# Patient Record
Sex: Female | Born: 1965 | Race: White | Hispanic: No | Marital: Single | Smoking: Current every day smoker
Health system: Southern US, Community
[De-identification: ages and names within clinical notes are randomized; demographics above are authoritative.]

## PROBLEM LIST (undated history)

## (undated) DIAGNOSIS — J90 Pleural effusion, not elsewhere classified: Secondary | ICD-10-CM

## (undated) DIAGNOSIS — R51 Headache: Secondary | ICD-10-CM

## (undated) DIAGNOSIS — I700 Atherosclerosis of aorta: Secondary | ICD-10-CM

## (undated) DIAGNOSIS — R069 Unspecified abnormalities of breathing: Secondary | ICD-10-CM

## (undated) DIAGNOSIS — E43 Unspecified severe protein-calorie malnutrition: Secondary | ICD-10-CM

## (undated) DIAGNOSIS — D649 Anemia, unspecified: Secondary | ICD-10-CM

## (undated) DIAGNOSIS — K275 Chronic or unspecified peptic ulcer, site unspecified, with perforation: Secondary | ICD-10-CM

## (undated) DIAGNOSIS — R519 Headache, unspecified: Secondary | ICD-10-CM

## (undated) DIAGNOSIS — K219 Gastro-esophageal reflux disease without esophagitis: Secondary | ICD-10-CM

## (undated) DIAGNOSIS — F329 Major depressive disorder, single episode, unspecified: Secondary | ICD-10-CM

## (undated) DIAGNOSIS — J869 Pyothorax without fistula: Secondary | ICD-10-CM

## (undated) DIAGNOSIS — K651 Peritoneal abscess: Secondary | ICD-10-CM

## (undated) DIAGNOSIS — F32A Depression, unspecified: Secondary | ICD-10-CM

## (undated) DIAGNOSIS — M1990 Unspecified osteoarthritis, unspecified site: Secondary | ICD-10-CM

## (undated) DIAGNOSIS — R0603 Acute respiratory distress: Secondary | ICD-10-CM

## (undated) DIAGNOSIS — E876 Hypokalemia: Secondary | ICD-10-CM

## (undated) DIAGNOSIS — F419 Anxiety disorder, unspecified: Secondary | ICD-10-CM

## (undated) HISTORY — PX: DIAGNOSTIC LAPAROSCOPY: SUR761

## (undated) HISTORY — DX: Unspecified severe protein-calorie malnutrition: E43

## (undated) HISTORY — DX: Major depressive disorder, single episode, unspecified: F32.9

## (undated) HISTORY — DX: Chronic or unspecified peptic ulcer, site unspecified, with perforation: K27.5

## (undated) HISTORY — DX: Depression, unspecified: F32.A

## (undated) HISTORY — DX: Anxiety disorder, unspecified: F41.9

## (undated) HISTORY — DX: Hypokalemia: E87.6

## (undated) HISTORY — DX: Peritoneal abscess: K65.1

## (undated) HISTORY — DX: Pyothorax without fistula: J86.9

## (undated) HISTORY — DX: Acute respiratory distress: R06.03

## (undated) HISTORY — DX: Unspecified abnormalities of breathing: R06.9

## (undated) HISTORY — PX: TONSILLECTOMY: SUR1361

## (undated) HISTORY — DX: Pleural effusion, not elsewhere classified: J90

## (undated) HISTORY — DX: Gastro-esophageal reflux disease without esophagitis: K21.9

---

## 2005-04-08 ENCOUNTER — Inpatient Hospital Stay: Payer: Self-pay | Admitting: Obstetrics and Gynecology

## 2007-04-23 ENCOUNTER — Ambulatory Visit: Payer: Self-pay | Admitting: Obstetrics and Gynecology

## 2008-06-16 ENCOUNTER — Ambulatory Visit: Payer: Self-pay

## 2009-08-03 ENCOUNTER — Ambulatory Visit: Payer: Self-pay

## 2016-06-04 DIAGNOSIS — A419 Sepsis, unspecified organism: Secondary | ICD-10-CM

## 2016-06-04 HISTORY — DX: Sepsis, unspecified organism: A41.9

## 2016-07-20 ENCOUNTER — Encounter
Admission: EM | Disposition: A | Discharge status: Home Health | Payer: Self-pay | Source: Home / Self Care | Attending: Surgery

## 2016-07-20 ENCOUNTER — Emergency Department: Payer: BLUE CROSS/BLUE SHIELD | Admitting: Anesthesiology

## 2016-07-20 ENCOUNTER — Encounter: Payer: Self-pay | Admitting: Emergency Medicine

## 2016-07-20 ENCOUNTER — Inpatient Hospital Stay
Admission: EM | Admit: 2016-07-20 | Discharge: 2016-09-14 | DRG: 853 | Disposition: A | Discharge status: Home Health | Payer: BLUE CROSS/BLUE SHIELD | Attending: Surgery | Admitting: Surgery

## 2016-07-20 ENCOUNTER — Emergency Department: Payer: BLUE CROSS/BLUE SHIELD

## 2016-07-20 DIAGNOSIS — K429 Umbilical hernia without obstruction or gangrene: Secondary | ICD-10-CM | POA: Diagnosis present

## 2016-07-20 DIAGNOSIS — T814XXA Infection following a procedure, initial encounter: Secondary | ICD-10-CM | POA: Diagnosis not present

## 2016-07-20 DIAGNOSIS — K567 Ileus, unspecified: Secondary | ICD-10-CM | POA: Diagnosis not present

## 2016-07-20 DIAGNOSIS — J869 Pyothorax without fistula: Secondary | ICD-10-CM | POA: Diagnosis not present

## 2016-07-20 DIAGNOSIS — R198 Other specified symptoms and signs involving the digestive system and abdomen: Secondary | ICD-10-CM

## 2016-07-20 DIAGNOSIS — A421 Abdominal actinomycosis: Secondary | ICD-10-CM | POA: Diagnosis present

## 2016-07-20 DIAGNOSIS — Z681 Body mass index (BMI) 19 or less, adult: Secondary | ICD-10-CM

## 2016-07-20 DIAGNOSIS — K255 Chronic or unspecified gastric ulcer with perforation: Secondary | ICD-10-CM

## 2016-07-20 DIAGNOSIS — R0600 Dyspnea, unspecified: Secondary | ICD-10-CM | POA: Diagnosis not present

## 2016-07-20 DIAGNOSIS — J90 Pleural effusion, not elsewhere classified: Secondary | ICD-10-CM | POA: Diagnosis not present

## 2016-07-20 DIAGNOSIS — J939 Pneumothorax, unspecified: Secondary | ICD-10-CM | POA: Diagnosis not present

## 2016-07-20 DIAGNOSIS — F17210 Nicotine dependence, cigarettes, uncomplicated: Secondary | ICD-10-CM | POA: Diagnosis present

## 2016-07-20 DIAGNOSIS — R0603 Acute respiratory distress: Secondary | ICD-10-CM

## 2016-07-20 DIAGNOSIS — B9620 Unspecified Escherichia coli [E. coli] as the cause of diseases classified elsewhere: Secondary | ICD-10-CM | POA: Diagnosis present

## 2016-07-20 DIAGNOSIS — Z01818 Encounter for other preprocedural examination: Secondary | ICD-10-CM

## 2016-07-20 DIAGNOSIS — R079 Chest pain, unspecified: Secondary | ICD-10-CM

## 2016-07-20 DIAGNOSIS — J9601 Acute respiratory failure with hypoxia: Secondary | ICD-10-CM | POA: Diagnosis not present

## 2016-07-20 DIAGNOSIS — Y838 Other surgical procedures as the cause of abnormal reaction of the patient, or of later complication, without mention of misadventure at the time of the procedure: Secondary | ICD-10-CM | POA: Diagnosis present

## 2016-07-20 DIAGNOSIS — E876 Hypokalemia: Secondary | ICD-10-CM | POA: Diagnosis present

## 2016-07-20 DIAGNOSIS — L0291 Cutaneous abscess, unspecified: Secondary | ICD-10-CM

## 2016-07-20 DIAGNOSIS — I480 Paroxysmal atrial fibrillation: Secondary | ICD-10-CM | POA: Diagnosis not present

## 2016-07-20 DIAGNOSIS — IMO0002 Reserved for concepts with insufficient information to code with codable children: Secondary | ICD-10-CM

## 2016-07-20 DIAGNOSIS — Z0189 Encounter for other specified special examinations: Secondary | ICD-10-CM

## 2016-07-20 DIAGNOSIS — J989 Respiratory disorder, unspecified: Secondary | ICD-10-CM | POA: Diagnosis not present

## 2016-07-20 DIAGNOSIS — D72829 Elevated white blood cell count, unspecified: Secondary | ICD-10-CM

## 2016-07-20 DIAGNOSIS — Z09 Encounter for follow-up examination after completed treatment for conditions other than malignant neoplasm: Secondary | ICD-10-CM

## 2016-07-20 DIAGNOSIS — E873 Alkalosis: Secondary | ICD-10-CM | POA: Diagnosis not present

## 2016-07-20 DIAGNOSIS — K650 Generalized (acute) peritonitis: Secondary | ICD-10-CM | POA: Diagnosis present

## 2016-07-20 DIAGNOSIS — B3789 Other sites of candidiasis: Secondary | ICD-10-CM | POA: Diagnosis present

## 2016-07-20 DIAGNOSIS — K660 Peritoneal adhesions (postprocedural) (postinfection): Secondary | ICD-10-CM | POA: Diagnosis not present

## 2016-07-20 DIAGNOSIS — R1013 Epigastric pain: Secondary | ICD-10-CM | POA: Diagnosis present

## 2016-07-20 DIAGNOSIS — R6520 Severe sepsis without septic shock: Secondary | ICD-10-CM | POA: Diagnosis not present

## 2016-07-20 DIAGNOSIS — E871 Hypo-osmolality and hyponatremia: Secondary | ICD-10-CM | POA: Diagnosis not present

## 2016-07-20 DIAGNOSIS — Z79899 Other long term (current) drug therapy: Secondary | ICD-10-CM | POA: Diagnosis not present

## 2016-07-20 DIAGNOSIS — J9690 Respiratory failure, unspecified, unspecified whether with hypoxia or hypercapnia: Secondary | ICD-10-CM

## 2016-07-20 DIAGNOSIS — K632 Fistula of intestine: Secondary | ICD-10-CM | POA: Diagnosis not present

## 2016-07-20 DIAGNOSIS — D62 Acute posthemorrhagic anemia: Secondary | ICD-10-CM | POA: Diagnosis not present

## 2016-07-20 DIAGNOSIS — K651 Peritoneal abscess: Secondary | ICD-10-CM | POA: Diagnosis not present

## 2016-07-20 DIAGNOSIS — R5381 Other malaise: Secondary | ICD-10-CM | POA: Diagnosis not present

## 2016-07-20 DIAGNOSIS — Z9889 Other specified postprocedural states: Secondary | ICD-10-CM

## 2016-07-20 DIAGNOSIS — E8770 Fluid overload, unspecified: Secondary | ICD-10-CM | POA: Diagnosis not present

## 2016-07-20 DIAGNOSIS — A419 Sepsis, unspecified organism: Secondary | ICD-10-CM | POA: Diagnosis present

## 2016-07-20 DIAGNOSIS — R0602 Shortness of breath: Secondary | ICD-10-CM

## 2016-07-20 DIAGNOSIS — E43 Unspecified severe protein-calorie malnutrition: Secondary | ICD-10-CM

## 2016-07-20 DIAGNOSIS — E860 Dehydration: Secondary | ICD-10-CM | POA: Diagnosis present

## 2016-07-20 DIAGNOSIS — R6521 Severe sepsis with septic shock: Secondary | ICD-10-CM | POA: Diagnosis not present

## 2016-07-20 DIAGNOSIS — R000 Tachycardia, unspecified: Secondary | ICD-10-CM

## 2016-07-20 DIAGNOSIS — B966 Bacteroides fragilis [B. fragilis] as the cause of diseases classified elsewhere: Secondary | ICD-10-CM | POA: Diagnosis present

## 2016-07-20 DIAGNOSIS — R069 Unspecified abnormalities of breathing: Secondary | ICD-10-CM

## 2016-07-20 DIAGNOSIS — J449 Chronic obstructive pulmonary disease, unspecified: Secondary | ICD-10-CM | POA: Diagnosis present

## 2016-07-20 DIAGNOSIS — K275 Chronic or unspecified peptic ulcer, site unspecified, with perforation: Secondary | ICD-10-CM | POA: Diagnosis not present

## 2016-07-20 DIAGNOSIS — R109 Unspecified abdominal pain: Secondary | ICD-10-CM

## 2016-07-20 DIAGNOSIS — R1021 Pelvic and perineal pain right side: Secondary | ICD-10-CM

## 2016-07-20 DIAGNOSIS — Z9689 Presence of other specified functional implants: Secondary | ICD-10-CM

## 2016-07-20 DIAGNOSIS — R102 Pelvic and perineal pain: Secondary | ICD-10-CM

## 2016-07-20 DIAGNOSIS — F419 Anxiety disorder, unspecified: Secondary | ICD-10-CM | POA: Diagnosis not present

## 2016-07-20 HISTORY — PX: LAPAROTOMY: SHX154

## 2016-07-20 LAB — URINALYSIS, COMPLETE (UACMP) WITH MICROSCOPIC
Bacteria, UA: NONE SEEN
Bilirubin Urine: NEGATIVE
GLUCOSE, UA: NEGATIVE mg/dL
HGB URINE DIPSTICK: NEGATIVE
Ketones, ur: NEGATIVE mg/dL
LEUKOCYTES UA: NEGATIVE
NITRITE: NEGATIVE
PH: 5 (ref 5.0–8.0)
Protein, ur: 100 mg/dL — AB
SPECIFIC GRAVITY, URINE: 1.029 (ref 1.005–1.030)

## 2016-07-20 LAB — MRSA PCR SCREENING: MRSA BY PCR: NEGATIVE

## 2016-07-20 LAB — TYPE AND SCREEN
ABO/RH(D): A POS
Antibody Screen: NEGATIVE

## 2016-07-20 LAB — COMPREHENSIVE METABOLIC PANEL
ALT: 13 U/L — AB (ref 14–54)
AST: 29 U/L (ref 15–41)
Albumin: 3.3 g/dL — ABNORMAL LOW (ref 3.5–5.0)
Alkaline Phosphatase: 90 U/L (ref 38–126)
Anion gap: 12 (ref 5–15)
BILIRUBIN TOTAL: 0.3 mg/dL (ref 0.3–1.2)
BUN: 27 mg/dL — AB (ref 6–20)
CO2: 21 mmol/L — ABNORMAL LOW (ref 22–32)
CREATININE: 0.66 mg/dL (ref 0.44–1.00)
Calcium: 9.5 mg/dL (ref 8.9–10.3)
Chloride: 102 mmol/L (ref 101–111)
Glucose, Bld: 126 mg/dL — ABNORMAL HIGH (ref 65–99)
Potassium: 3.2 mmol/L — ABNORMAL LOW (ref 3.5–5.1)
Sodium: 135 mmol/L (ref 135–145)
TOTAL PROTEIN: 7.2 g/dL (ref 6.5–8.1)

## 2016-07-20 LAB — WET PREP, GENITAL
CLUE CELLS WET PREP: NONE SEEN
SPERM: NONE SEEN
TRICH WET PREP: NONE SEEN
YEAST WET PREP: NONE SEEN

## 2016-07-20 LAB — CHLAMYDIA/NGC RT PCR (ARMC ONLY)
Chlamydia Tr: NOT DETECTED
N gonorrhoeae: NOT DETECTED

## 2016-07-20 LAB — CBC
HCT: 32.3 % — ABNORMAL LOW (ref 35.0–47.0)
Hemoglobin: 10.5 g/dL — ABNORMAL LOW (ref 12.0–16.0)
MCH: 22.5 pg — ABNORMAL LOW (ref 26.0–34.0)
MCHC: 32.5 g/dL (ref 32.0–36.0)
MCV: 69.4 fL — ABNORMAL LOW (ref 80.0–100.0)
PLATELETS: 414 10*3/uL (ref 150–440)
RBC: 4.66 MIL/uL (ref 3.80–5.20)
RDW: 22.1 % — AB (ref 11.5–14.5)
WBC: 5.5 10*3/uL (ref 3.6–11.0)

## 2016-07-20 LAB — MAGNESIUM: Magnesium: 1.4 mg/dL — ABNORMAL LOW (ref 1.7–2.4)

## 2016-07-20 LAB — LACTIC ACID, PLASMA
Lactic Acid, Venous: 2 mmol/L (ref 0.5–1.9)
Lactic Acid, Venous: 1.4 mmol/L (ref 0.5–1.9)

## 2016-07-20 LAB — PROTIME-INR
INR: 1.19
Prothrombin Time: 15.2 seconds (ref 11.4–15.2)

## 2016-07-20 LAB — PHOSPHORUS: PHOSPHORUS: 3.9 mg/dL (ref 2.5–4.6)

## 2016-07-20 LAB — PREGNANCY, URINE: Preg Test, Ur: NEGATIVE

## 2016-07-20 LAB — GLUCOSE, CAPILLARY: GLUCOSE-CAPILLARY: 128 mg/dL — AB (ref 65–99)

## 2016-07-20 LAB — LIPASE, BLOOD: Lipase: 13 U/L (ref 11–51)

## 2016-07-20 SURGERY — LAPAROTOMY, EXPLORATORY
Anesthesia: General | Wound class: Dirty or Infected

## 2016-07-20 MED ORDER — LACTATED RINGERS IV SOLN
INTRAVENOUS | Status: DC | PRN
  Administered 2016-07-20 (×2): via INTRAVENOUS

## 2016-07-20 MED ORDER — DEXAMETHASONE SODIUM PHOSPHATE 10 MG/ML IJ SOLN
INTRAMUSCULAR | Status: DC | PRN
  Administered 2016-07-20: 10 mg via INTRAVENOUS

## 2016-07-20 MED ORDER — MIDAZOLAM HCL 2 MG/2ML IJ SOLN
INTRAMUSCULAR | Status: AC
  Filled 2016-07-20: qty 2

## 2016-07-20 MED ORDER — LIDOCAINE HCL (CARDIAC) 20 MG/ML IV SOLN
INTRAVENOUS | Status: DC | PRN
  Administered 2016-07-20: 45 mg via INTRAVENOUS

## 2016-07-20 MED ORDER — SODIUM CHLORIDE 0.9 % IV SOLN
INTRAVENOUS | Status: DC | PRN
  Administered 2016-07-20: 70 mL

## 2016-07-20 MED ORDER — ONDANSETRON 4 MG PO TBDP
4.0000 mg | ORAL_TABLET | Freq: Four times a day (QID) | ORAL | Status: DC | PRN
  Administered 2016-08-13 – 2016-08-28 (×3): 4 mg via ORAL
  Filled 2016-07-20 (×4): qty 1

## 2016-07-20 MED ORDER — PIPERACILLIN-TAZOBACTAM 3.375 G IVPB 30 MIN
3.3750 g | Freq: Once | INTRAVENOUS | Status: AC
  Administered 2016-07-20: 3.375 g via INTRAVENOUS
  Filled 2016-07-20: qty 50

## 2016-07-20 MED ORDER — FENTANYL CITRATE (PF) 100 MCG/2ML IJ SOLN
INTRAMUSCULAR | Status: DC | PRN
  Administered 2016-07-20 (×2): 50 ug via INTRAVENOUS

## 2016-07-20 MED ORDER — HYDROMORPHONE HCL 1 MG/ML IJ SOLN
INTRAMUSCULAR | Status: DC | PRN
  Administered 2016-07-20: .2 mg via INTRAVENOUS

## 2016-07-20 MED ORDER — MORPHINE SULFATE (PF) 4 MG/ML IV SOLN
4.0000 mg | Freq: Once | INTRAVENOUS | Status: AC
  Administered 2016-07-20: 4 mg via INTRAVENOUS
  Filled 2016-07-20: qty 1

## 2016-07-20 MED ORDER — ALBUMIN HUMAN 5 % IV SOLN
INTRAVENOUS | Status: DC | PRN
  Administered 2016-07-20: 18:00:00 via INTRAVENOUS

## 2016-07-20 MED ORDER — SODIUM CHLORIDE 0.9 % IJ SOLN
INTRAMUSCULAR | Status: AC
  Filled 2016-07-20: qty 50

## 2016-07-20 MED ORDER — STERILE WATER FOR INJECTION IJ SOLN
INTRAMUSCULAR | Status: AC
  Administered 2016-07-20: 20 mL
  Filled 2016-07-20: qty 10

## 2016-07-20 MED ORDER — ROCURONIUM BROMIDE 100 MG/10ML IV SOLN
INTRAVENOUS | Status: DC | PRN
  Administered 2016-07-20: 15 mg via INTRAVENOUS
  Administered 2016-07-20: 20 mg via INTRAVENOUS
  Administered 2016-07-20: 5 mg via INTRAVENOUS
  Administered 2016-07-20: 10 mg via INTRAVENOUS

## 2016-07-20 MED ORDER — MIDAZOLAM HCL 5 MG/5ML IJ SOLN
INTRAMUSCULAR | Status: DC | PRN
  Administered 2016-07-20: 2 mg via INTRAVENOUS

## 2016-07-20 MED ORDER — SODIUM CHLORIDE 0.9 % IV BOLUS (SEPSIS)
1000.0000 mL | Freq: Once | INTRAVENOUS | Status: AC
  Administered 2016-07-20: 1000 mL via INTRAVENOUS

## 2016-07-20 MED ORDER — FENTANYL CITRATE (PF) 100 MCG/2ML IJ SOLN
INTRAMUSCULAR | Status: AC
  Filled 2016-07-20: qty 2

## 2016-07-20 MED ORDER — ENOXAPARIN SODIUM 40 MG/0.4ML ~~LOC~~ SOLN
40.0000 mg | SUBCUTANEOUS | Status: DC
  Administered 2016-07-21 – 2016-07-25 (×5): 40 mg via SUBCUTANEOUS
  Filled 2016-07-20 (×5): qty 0.4

## 2016-07-20 MED ORDER — ONDANSETRON HCL 4 MG/2ML IJ SOLN
INTRAMUSCULAR | Status: AC
  Filled 2016-07-20: qty 2

## 2016-07-20 MED ORDER — ONDANSETRON HCL 4 MG/2ML IJ SOLN: 4.0000 mg | Freq: Once | INTRAMUSCULAR | Status: DC | PRN

## 2016-07-20 MED ORDER — IOPAMIDOL (ISOVUE-300) INJECTION 61%
100.0000 mL | Freq: Once | INTRAVENOUS | Status: AC | PRN
  Administered 2016-07-20: 100 mL via INTRAVENOUS

## 2016-07-20 MED ORDER — DEXAMETHASONE SODIUM PHOSPHATE 10 MG/ML IJ SOLN
INTRAMUSCULAR | Status: AC
  Filled 2016-07-20: qty 1

## 2016-07-20 MED ORDER — ONDANSETRON HCL 4 MG/2ML IJ SOLN
INTRAMUSCULAR | Status: DC | PRN
  Administered 2016-07-20: 4 mg via INTRAVENOUS

## 2016-07-20 MED ORDER — SUGAMMADEX SODIUM 200 MG/2ML IV SOLN
INTRAVENOUS | Status: DC | PRN
  Administered 2016-07-20: 120 mg via INTRAVENOUS

## 2016-07-20 MED ORDER — ALBUMIN HUMAN 5 % IV SOLN
INTRAVENOUS | Status: AC
  Filled 2016-07-20: qty 500

## 2016-07-20 MED ORDER — DEXTROSE IN LACTATED RINGERS 5 % IV SOLN
INTRAVENOUS | Status: DC
  Administered 2016-07-20 – 2016-07-23 (×8): via INTRAVENOUS

## 2016-07-20 MED ORDER — ACETAMINOPHEN 10 MG/ML IV SOLN
1000.0000 mg | Freq: Four times a day (QID) | INTRAVENOUS | Status: AC
  Administered 2016-07-20 – 2016-07-21 (×3): 1000 mg via INTRAVENOUS
  Filled 2016-07-20 (×3): qty 100

## 2016-07-20 MED ORDER — METHYLENE BLUE 0.5 % INJ SOLN
INTRAVENOUS | Status: AC
  Filled 2016-07-20: qty 10

## 2016-07-20 MED ORDER — ACETAMINOPHEN 10 MG/ML IV SOLN
INTRAVENOUS | Status: DC | PRN
  Administered 2016-07-20: 1000 mg via INTRAVENOUS

## 2016-07-20 MED ORDER — HYDROMORPHONE HCL 1 MG/ML IJ SOLN
INTRAMUSCULAR | Status: AC
  Filled 2016-07-20: qty 1

## 2016-07-20 MED ORDER — PANTOPRAZOLE SODIUM 40 MG IV SOLR
40.0000 mg | Freq: Once | INTRAVENOUS | Status: DC
  Filled 2016-07-20: qty 40

## 2016-07-20 MED ORDER — ONDANSETRON HCL 4 MG/2ML IJ SOLN
4.0000 mg | Freq: Four times a day (QID) | INTRAMUSCULAR | Status: DC | PRN
  Administered 2016-07-20 – 2016-08-22 (×11): 4 mg via INTRAVENOUS
  Administered 2016-08-23: 17:00:00 via INTRAVENOUS
  Administered 2016-08-25 – 2016-09-06 (×4): 4 mg via INTRAVENOUS
  Filled 2016-07-20 (×15): qty 2

## 2016-07-20 MED ORDER — IOPAMIDOL (ISOVUE-300) INJECTION 61%
15.0000 mL | Freq: Once | INTRAVENOUS | Status: AC | PRN
Start: 2016-07-20 — End: 2016-07-20
  Administered 2016-07-20: 15 mL via ORAL

## 2016-07-20 MED ORDER — HYDROMORPHONE HCL 1 MG/ML IJ SOLN
1.0000 mg | Freq: Once | INTRAMUSCULAR | Status: AC
  Administered 2016-07-20: 1 mg via INTRAVENOUS
  Filled 2016-07-20: qty 1

## 2016-07-20 MED ORDER — SUCCINYLCHOLINE CHLORIDE 20 MG/ML IJ SOLN
INTRAMUSCULAR | Status: DC | PRN
  Administered 2016-07-20: 100 mg via INTRAVENOUS

## 2016-07-20 MED ORDER — PHENYLEPHRINE HCL 10 MG/ML IJ SOLN
INTRAMUSCULAR | Status: DC | PRN
  Administered 2016-07-20: 100 ug via INTRAVENOUS

## 2016-07-20 MED ORDER — BUPIVACAINE-EPINEPHRINE (PF) 0.25% -1:200000 IJ SOLN
INTRAMUSCULAR | Status: DC | PRN
  Administered 2016-07-20: 30 mL

## 2016-07-20 MED ORDER — FENTANYL CITRATE (PF) 100 MCG/2ML IJ SOLN
25.0000 ug | INTRAMUSCULAR | Status: DC | PRN
  Administered 2016-07-20: 25 ug via INTRAVENOUS

## 2016-07-20 MED ORDER — PROPOFOL 10 MG/ML IV BOLUS
INTRAVENOUS | Status: AC
  Filled 2016-07-20: qty 20

## 2016-07-20 MED ORDER — PANTOPRAZOLE SODIUM 40 MG IV SOLR
40.0000 mg | Freq: Two times a day (BID) | INTRAVENOUS | Status: DC
  Administered 2016-07-20 – 2016-08-05 (×33): 40 mg via INTRAVENOUS
  Filled 2016-07-20 (×32): qty 40

## 2016-07-20 MED ORDER — PIPERACILLIN-TAZOBACTAM 3.375 G IVPB
3.3750 g | Freq: Three times a day (TID) | INTRAVENOUS | Status: DC
  Administered 2016-07-20 – 2016-08-11 (×64): 3.375 g via INTRAVENOUS
  Filled 2016-07-20 (×63): qty 50

## 2016-07-20 MED ORDER — MORPHINE SULFATE (PF) 4 MG/ML IV SOLN
2.0000 mg | INTRAVENOUS | Status: DC | PRN
  Administered 2016-07-20 – 2016-07-27 (×5): 2 mg via INTRAVENOUS
  Filled 2016-07-20 (×5): qty 1

## 2016-07-20 MED ORDER — LIDOCAINE HCL 2 % EX GEL
CUTANEOUS | Status: AC
  Filled 2016-07-20: qty 5

## 2016-07-20 MED ORDER — PROPOFOL 10 MG/ML IV BOLUS
INTRAVENOUS | Status: DC | PRN
  Administered 2016-07-20: 140 mg via INTRAVENOUS

## 2016-07-20 MED ORDER — SODIUM CHLORIDE 0.9 % IV BOLUS (SEPSIS)
1000.0000 mL | Freq: Once | INTRAVENOUS | Status: AC
  Administered 2016-07-20: 400 mL via INTRAVENOUS
  Administered 2016-07-20: 600 mL via INTRAVENOUS

## 2016-07-20 MED ORDER — ACETAMINOPHEN 10 MG/ML IV SOLN
INTRAVENOUS | Status: AC
  Filled 2016-07-20: qty 100

## 2016-07-20 SURGICAL SUPPLY — 54 items
APPLIER CLIP 11 MED OPEN (CLIP)
APPLIER CLIP 13 LRG OPEN (CLIP)
BLADE CLIPPER SURG (BLADE) IMPLANT
BLADE SURG 15 STRL LF DISP TIS (BLADE) ×1 IMPLANT
BLADE SURG 15 STRL SS (BLADE) ×2
BULB RESERV EVAC DRAIN JP 100C (MISCELLANEOUS) ×6 IMPLANT
CANISTER SUCT 3000ML (MISCELLANEOUS) ×6 IMPLANT
CHLORAPREP W/TINT 26ML (MISCELLANEOUS) ×3 IMPLANT
CLIP APPLIE 11 MED OPEN (CLIP) IMPLANT
CLIP APPLIE 13 LRG OPEN (CLIP) IMPLANT
DRAIN CHANNEL JP 19F (MISCELLANEOUS) ×6 IMPLANT
DRAPE LAPAROTOMY 100X77 ABD (DRAPES) ×3 IMPLANT
DRAPE TABLE BACK 80X90 (DRAPES) ×3 IMPLANT
DRSG TEGADERM 2-3/8X2-3/4 SM (GAUZE/BANDAGES/DRESSINGS) IMPLANT
DRSG TEGADERM 4X4.75 (GAUZE/BANDAGES/DRESSINGS) ×6 IMPLANT
DRSG TELFA 3X8 NADH (GAUZE/BANDAGES/DRESSINGS) ×3 IMPLANT
ELECT BLADE 6.5 EXT (BLADE) IMPLANT
ELECT CAUTERY BLADE 6.4 (BLADE) ×3 IMPLANT
ELECT REM PT RETURN 9FT ADLT (ELECTROSURGICAL) ×3
ELECTRODE REM PT RTRN 9FT ADLT (ELECTROSURGICAL) ×1 IMPLANT
GAUZE SPONGE 4X4 12PLY STRL (GAUZE/BANDAGES/DRESSINGS) ×6 IMPLANT
GLOVE BIO SURGEON STRL SZ7 (GLOVE) ×18 IMPLANT
GOWN STRL REUS W/ TWL LRG LVL3 (GOWN DISPOSABLE) ×3 IMPLANT
GOWN STRL REUS W/TWL LRG LVL3 (GOWN DISPOSABLE) ×6
HANDLE SUCTION POOLE (INSTRUMENTS) ×1 IMPLANT
HANDLE YANKAUER SUCT BULB TIP (MISCELLANEOUS) ×3 IMPLANT
HOLDER FOLEY CATH W/STRAP (MISCELLANEOUS) ×3 IMPLANT
LIGASURE IMPACT 36 18CM CVD LR (INSTRUMENTS) IMPLANT
NEEDLE HYPO 22GX1.5 SAFETY (NEEDLE) ×6 IMPLANT
NEEDLE HYPO 25X1 1.5 SAFETY (NEEDLE) ×3 IMPLANT
NS IRRIG 1000ML POUR BTL (IV SOLUTION) ×9 IMPLANT
PACK BASIN MAJOR ARMC (MISCELLANEOUS) ×3 IMPLANT
PENCIL ELECTRO HAND CTR (MISCELLANEOUS) ×3 IMPLANT
RELOAD PROXIMATE 75MM BLUE (ENDOMECHANICALS) IMPLANT
SPONGE LAP 18X18 5 PK (GAUZE/BANDAGES/DRESSINGS) ×6 IMPLANT
SPONGE LAP 18X36 2PK (MISCELLANEOUS) ×3 IMPLANT
STAPLER PROXIMATE 75MM BLUE (STAPLE) IMPLANT
STAPLER SKIN PROX 35W (STAPLE) ×3 IMPLANT
SUCTION POOLE HANDLE (INSTRUMENTS) ×3
SUT ETHILON 3-0 FS-10 30 BLK (SUTURE) ×3
SUT PDS AB 1 TP1 96 (SUTURE) IMPLANT
SUT SILK 2 0 (SUTURE) ×2
SUT SILK 2 0 SH CR/8 (SUTURE) ×3 IMPLANT
SUT SILK 2 0SH CR/8 30 (SUTURE) ×6 IMPLANT
SUT SILK 2-0 18XBRD TIE 12 (SUTURE) ×1 IMPLANT
SUT VIC AB 0 CT1 36 (SUTURE) ×6 IMPLANT
SUT VIC AB 2-0 SH 27 (SUTURE) ×4
SUT VIC AB 2-0 SH 27XBRD (SUTURE) ×2 IMPLANT
SUTURE EHLN 3-0 FS-10 30 BLK (SUTURE) ×1 IMPLANT
SYR 30ML LL (SYRINGE) ×6 IMPLANT
SYR 3ML LL SCALE MARK (SYRINGE) ×3 IMPLANT
SYR BULB IRRIG 60ML STRL (SYRINGE) ×3 IMPLANT
TAPE MICROFOAM 4IN (TAPE) IMPLANT
TRAY FOLEY W/METER SILVER 16FR (SET/KITS/TRAYS/PACK) ×3 IMPLANT

## 2016-07-20 NOTE — ED Triage Notes (Signed)
Patient presents to the ED with generalized abdominal pain x 2-3 days with intense cramping.  Patient states, "I thought it was just gas but gas-x didn't help, then I thought maybe I was constipated but I pooped a little and that didn't help either.  I feel like I'm having a baby."  Patient appears pale and thin.  Patient denies history of similar pain.  Patient is having difficulty walking due to pain.

## 2016-07-20 NOTE — Anesthesia Procedure Notes (Signed)
Procedure Name: Intubation Date/Time: 07/20/2016 3:53 PM Performed by: Dionne Bucy Pre-anesthesia Checklist: Patient identified, Patient being monitored, Timeout performed, Emergency Drugs available and Suction available Patient Re-evaluated:Patient Re-evaluated prior to inductionOxygen Delivery Method: Circle system utilized Preoxygenation: Pre-oxygenation with 100% oxygen Intubation Type: IV induction, Cricoid Pressure applied and Rapid sequence Ventilation: Mask ventilation without difficulty Laryngoscope Size: Mac and 4 Grade View: Grade I Tube type: Oral Tube size: 7.0 mm Number of attempts: 1 Airway Equipment and Method: Stylet Placement Confirmation: ETT inserted through vocal cords under direct vision,  positive ETCO2 and breath sounds checked- equal and bilateral Secured at: 22 cm Tube secured with: Tape Dental Injury: Teeth and Oropharynx as per pre-operative assessment

## 2016-07-20 NOTE — ED Provider Notes (Signed)
[facility] Provider Note   CSN: [id number] Arrival date & time: 07/20/16  [id number]     History   Chief Complaint Chief Complaint  Patient presents with  . Abdominal Pain    HPI Lindsey Wolf is a 51 y.o. female otherwise healthy here with abdominal pain. Has lower abdominal pain associated with cramping for the last 2-3 days. She thought it was gas and took Gas-X yesterday With no relief. She states that the pain is as bad as her childbirth. Denies any urinary symptoms. Denies any constipation or diarrhea. Any previous abdominal surgeries and denies any NSAID use.    The history is provided by the patient.    History reviewed. No pertinent past medical history.  There are no active problems to display for this patient.   History reviewed. No pertinent surgical history.  OB History    No data available       Home Medications    Prior to Admission medications   Medication Sig Start Date End Date Taking? Authorizing Provider  ALPRAZolam (XANAX) 0.25 MG tablet Take 0.25 mg by mouth 3 (three) times daily as needed.  06/14/16  Yes Historical Provider, MD  FLUoxetine (PROZAC) 20 MG capsule Take 40 mg by mouth every morning. 06/15/16  Yes Historical Provider, MD    Family History No family history on file.  Social History Social History  Substance Use Topics  . Smoking status: Current Every Day Smoker    Types: Cigarettes  . Smokeless tobacco: Never Used  . Alcohol use No     Allergies   Patient has no known allergies.   Review of Systems Review of Systems  Gastrointestinal: Positive for abdominal pain.  All other systems reviewed and are negative.    Physical Exam Updated Vital Signs BP 120/76 (BP Location: Left Arm)   Pulse (!) 105   Temp 97.5 F (36.4 C) (Oral)   Resp 18   Ht 5\' 10"  (1.778 m)   Wt 125 lb (56.7 kg)   SpO2 99%   BMI 17.94 kg/m   Physical Exam  Constitutional: She is oriented to person, place, and time.    Uncomfortable   HENT:  Head: Normocephalic.  MM dry   Eyes: EOM are normal. Pupils are equal, round, and reactive to light.  Neck: Normal range of motion. Neck supple.  Cardiovascular: Normal rate, regular rhythm and normal heart sounds.   Pulmonary/Chest: Effort normal and breath sounds normal. No respiratory distress. She has no wheezes. She has no rales.  Abdominal:  Firm, mild diffuse tenderness, worse in the lower abdomen, some rebound   Genitourinary: Vagina normal.  Genitourinary Comments: Whitish discharge. No obvious CMT, ? Mild R adnexal tenderness.   Musculoskeletal: Normal range of motion.  Neurological: She is alert and oriented to person, place, and time. No cranial nerve deficit. Coordination normal.  Skin: Skin is warm.  Psychiatric: She has a normal mood and affect.  Nursing note and vitals reviewed.    ED Treatments / Results  Labs (all labs ordered are listed, but only abnormal results are displayed) Labs Reviewed  WET PREP, GENITAL - Abnormal; Notable for the following:       Result Value   WBC, Wet Prep HPF POC RARE (*)    All other components within normal limits  COMPREHENSIVE METABOLIC PANEL - Abnormal; Notable for the following:    Potassium 3.2 (*)    CO2 21 (*)    Glucose, Bld 126 (*)    BUN  27 (*)    Albumin 3.3 (*)    ALT 13 (*)    All other components within normal limits  CBC - Abnormal; Notable for the following:    Hemoglobin 10.5 (*)    HCT 32.3 (*)    MCV 69.4 (*)    MCH 22.5 (*)    RDW 22.1 (*)    All other components within normal limits  URINALYSIS, COMPLETE (UACMP) WITH MICROSCOPIC - Abnormal; Notable for the following:    Color, Urine AMBER (*)    APPearance CLEAR (*)    Protein, ur 100 (*)    Squamous Epithelial / LPF 0-5 (*)    All other components within normal limits  CHLAMYDIA/NGC RT PCR ([facility] ONLY)  CULTURE, BLOOD (ROUTINE X 2)  CULTURE, BLOOD (ROUTINE X 2)  LIPASE, BLOOD  PREGNANCY, URINE  LACTIC ACID,  PLASMA    EKG  EKG Interpretation None       Radiology Dg Abd Acute W/chest  Addendum Date: 07/20/2016   ADDENDUM REPORT: 07/20/2016 13:50 ADDENDUM: Critical Value/emergent results were called by telephone at the time of interpretation on 07/20/2016 at 1348 hours to Dr. Shirlyn Goltz , who verbally acknowledged these results. CT Abdomen and Pelvis is planned and pending. Electronically Signed   By: Genevie Ann M.D.   On: 07/20/2016 13:50   Result Date: 07/20/2016 CLINICAL DATA:  51 year old female with severe abdominal pain and vomiting for 2 days. Denies abdominal surgery. Initial encounter. EXAM: DG ABDOMEN ACUTE W/ 1V CHEST COMPARISON:  None. FINDINGS: Seated AP view of the chest is positive for pneumoperitoneum under the right hemidiaphragm which is also confirmed on the upright abdomen view. No pneumothorax. No pneumomediastinum is evident. Normal cardiac size and mediastinal contours. Visualized tracheal air column is within normal limits. The lungs appear clear. No pleural effusion. Unusual bowel-gas pattern including a central epigastric air-fluid level which seems unrelated to the stomach (arrow). On the upright abdomen view there is pneumoperitoneum under both hemidiaphragms. No other dilated loops. Relatively normal appearing distal small bowel and proximal colon gas. No acute osseous abnormality identified. IMPRESSION: 1. Positive for fairly large volume of free air in the abdomen indicating ruptured abdominal viscus. If the patient is stable enough CT Abdomen and Pelvis with oral and IV contrast is recommended. 2. Unusual epigastric bowel-gas pattern suspicious for dilated and abnormal upper abdominal bowel loop. 3.  No superimposed acute cardiopulmonary abnormality. Electronically Signed: By: Genevie Ann M.D. On: 07/20/2016 13:45    Procedures Procedures (including critical care time)   CRITICAL CARE Performed by: Wandra Arthurs   Total critical care time: 30 minutes  Critical care time was  exclusive of separately billable procedures and treating other patients.  Critical care was necessary to treat or prevent imminent or life-threatening deterioration.  Critical care was time spent personally by me on the following activities: development of treatment plan with patient and/or surrogate as well as nursing, discussions with consultants, evaluation of patient's response to treatment, examination of patient, obtaining history from patient or surrogate, ordering and performing treatments and interventions, ordering and review of laboratory studies, ordering and review of radiographic studies, pulse oximetry and re-evaluation of patient's condition.   Medications Ordered in ED Medications  piperacillin-tazobactam (ZOSYN) IVPB 3.375 g (not administered)  sodium chloride 0.9 % bolus 1,000 mL (1,000 mLs Intravenous New Bag/Given 07/20/16 1247)  sodium chloride 0.9 % bolus 1,000 mL (1,000 mLs Intravenous New Bag/Given 07/20/16 1247)  morphine 4 MG/ML injection 4 mg (4 mg  Intravenous Given 07/20/16 1247)  HYDROmorphone (DILAUDID) injection 1 mg (1 mg Intravenous Given 07/20/16 1431)  iopamidol (ISOVUE-300) 61 % injection 100 mL (100 mLs Intravenous Contrast Given 07/20/16 1401)  iopamidol (ISOVUE-300) 61 % injection 15 mL (15 mLs Oral Contrast Given 07/20/16 1400)     Initial Impression / Assessment and Plan / ED Course  I have reviewed the triage vital signs and the nursing notes.  Pertinent labs & imaging results that were available during my care of the patient were reviewed by me and considered in my medical decision making (see chart for details).     Lindsey Wolf is a 50 y.o. female here with abdominal pain. Diffusely tender, mostly in the lower abdomen. Consider torsion vs renal colic vs appy. Will get labs, xray abdominal series, [place].   2 pm Xray showed free air. Canceled ovarian US. Will get stat CT.   2:48 PM CT showed possible stomach ulcer perforation. WBC nl. Bicarb 21  likely from dehydration. Ordered zosyn. Cultures sent. Called Dr. Dahlia Byes from surgery, who will see patient and likely take her to the OR for repair.     Final Clinical Impressions(s) / ED Diagnoses   Final diagnoses:  Right adnexal tenderness  Right adnexal tenderness    New Prescriptions New Prescriptions   No medications on file     Drenda Freeze, MD 07/20/16 [phone]

## 2016-07-20 NOTE — ED Notes (Signed)
Per MD [place], 1 set of cultures if fine. MD ready for pt to go to OR

## 2016-07-20 NOTE — Anesthesia Post-op Follow-up Note (Cosign Needed)
Anesthesia QCDR form completed.        

## 2016-07-20 NOTE — H&P (Signed)
Patient ID: Lindsey Wolf, female   DOB: 01/05/1966, 51 y.o.   MRN: [id number]  HPI Lindsey Wolf is a 51 y.o. female with a 3 days hx of abdominal pain in the epigastric area and she thinks it was a gas type. Pain is moderate to severe in intensity and has worsening today. And also some chills. No previous abdominal operations. She states that she takes at least daily BC powder for her headache. He is able to perform more than 4 Mets of activity without any shortness of breath or chest pain. She smokes 1 pack of cigarettes a day. Further workup including a CT scan of the abdomen and pelvis showing extensive amount of free air and some thickening in the TRAM/duodenal junction. Some thickening of the small bowel. There is also significant ascites  HPI  History reviewed. No pertinent past medical history.  History reviewed. No pertinent surgical history.  No family history on file.  Social History Social History  Substance Use Topics  . Smoking status: Current Every Day Smoker    Types: Cigarettes  . Smokeless tobacco: Never Used  . Alcohol use No    No Known Allergies  Current Facility-Administered Medications  Medication Dose Route Frequency Provider Last Rate Last Dose  . pantoprazole (PROTONIX) injection 40 mg  40 mg Intravenous Once Drenda Freeze, MD      . piperacillin-tazobactam (ZOSYN) IVPB 3.375 g  3.375 g Intravenous Once Drenda Freeze, MD      . sodium chloride 0.9 % bolus 1,000 mL  1,000 mL Intravenous Once Jules Husbands, MD       Current Outpatient Prescriptions  Medication Sig Dispense Refill  . ALPRAZolam (XANAX) 0.25 MG tablet Take 0.25 mg by mouth 3 (three) times daily as needed.   0  . FLUoxetine (PROZAC) 20 MG capsule Take 40 mg by mouth every morning.  0     Review of Systems A 10 point review of systems was asked and was negative except for the information on the HPI  Physical Exam Blood pressure 120/76, pulse (!) 105, temperature 97.5 F  (36.4 C), temperature source Oral, resp. rate 18, height 5\' 10"  (1.778 m), weight 56.7 kg (125 lb), SpO2 99 %. CONSTITUTIONAL: Pale , tachycardic and diaphoretic EYES: Pupils are equal, round, and reactive to light, Sclera are non-icteric. EARS, NOSE, MOUTH AND THROAT: The oropharynx is clear. The oral mucosa is pink  She is dehydrated. Hearing is intact to voice. LYMPH NODES:  Lymph nodes in the neck are normal. RESPIRATORY:  Lungs are clear. There is normal respiratory effort, with equal breath sounds bilaterally, and without pathologic use of accessory muscles. CARDIOVASCULAR: Heart is regular without murmurs, gallops, or rubs. GI: The abdomen is Tender epigastric area, rebound tenderness and guarding.  GU: Rectal deferred.   MUSCULOSKELETAL: Normal muscle strength and tone. No cyanosis or edema.   SKIN: Turgor is good and there are no pathologic skin lesions or ulcers. NEUROLOGIC: Motor and sensation is grossly normal. Cranial nerves are grossly intact. PSYCH:  Oriented to person, place and time. Affect is normal.  Data Reviewed I have personally reviewed the patient's imaging, laboratory findings and medical records.    Assessment/Plan 51 year old female presented with an acute abdomen and free air consistent with perforated ulcer. Patient is showing signs of sepsis and is in need for emergent surgical intervention. We'll go ahead and start [name] resuscitation, place NG tube and Foley. Start broad-spectrum antibiotics and type and cross her for 2  units. I have discussed with her in detail about the necessity of this surgery to be able to save her life. I have explained to her the risk, the benefits and the possible complications including but not limited to: Bleeding, infection, chronic pain, recurrence and multiple re-interventions. He understands and wishes to proceed. We'll go ahead and proceed with exploratory laparotomy.  Caroleen Hamman, MD FACS General Surgeon 07/20/2016, 3:20  PM

## 2016-07-20 NOTE — ED Notes (Signed)
Pt unable to urinate at this moment, given specimen cup for when is able to void.  

## 2016-07-20 NOTE — Progress Notes (Signed)
Day of Surgery   Subjective:  Patient visit for postop check. Patient reports feeling better than before surgery. Pain currently well controlled.  Vital signs in last 24 hours: Temp:  [97.5 F (36.4 C)-99 F (37.2 C)] 99 F (37.2 C) (02/16 2100) Pulse Rate:  [61-105] 90 (02/16 2100) Resp:  [13-21] 17 (02/16 2100) BP: (100-131)/(49-92) 121/80 (02/16 2100) SpO2:  [97 %-100 %] 100 % (02/16 2100) Weight:  [56.7 kg (125 lb)] 56.7 kg (125 lb) (02/16 0907)    Intake/Output from previous day: No intake/output data recorded.  GI: Abdomen soft, appropriately tender to palpation at the incision site, midline dressing clean, dry, intact. Bilateral JP drains with serosanguineous output.  Lab Results:  CBC  Recent Labs  07/20/16 0934  WBC 5.5  HGB 10.5*  HCT 32.3*  PLT 414   CMP     Component Value Date/Time   NA 135 07/20/2016 0934   K 3.2 (L) 07/20/2016 0934   CL 102 07/20/2016 0934   CO2 21 (L) 07/20/2016 0934   GLUCOSE 126 (H) 07/20/2016 0934   BUN 27 (H) 07/20/2016 0934   CREATININE 0.66 07/20/2016 0934   CALCIUM 9.5 07/20/2016 0934   PROT 7.2 07/20/2016 0934   ALBUMIN 3.3 (L) 07/20/2016 0934   AST 29 07/20/2016 0934   ALT 13 (L) 07/20/2016 0934   ALKPHOS 90 07/20/2016 0934   BILITOT 0.3 07/20/2016 0934   GFRNONAA >60 07/20/2016 0934   GFRAA >60 07/20/2016 0934   PT/INR  Recent Labs  07/20/16 1507  LABPROT 15.2  INR 1.19    Studies/Results: Ct Abdomen Pelvis W Contrast  Result Date: 07/20/2016 CLINICAL DATA:  Abnormal radiographs demonstrating free air, 3 days generalized abdominal and pelvic pain, denies recent surgery and trauma EXAM: CT ABDOMEN AND PELVIS WITH CONTRAST TECHNIQUE: Multidetector CT imaging of the abdomen and pelvis was performed using the standard protocol following bolus administration of intravenous contrast. Sagittal and coronal MPR images reconstructed from axial data set. CONTRAST:  1mL ISOVUE-300 IOPAMIDOL (ISOVUE-300) INJECTION 61% PO,  176mL ISOVUE-300 IOPAMIDOL (ISOVUE-300) INJECTION 61% IV COMPARISON:  Abdominal radiographs 07/20/2016 FINDINGS: Lower chest: Lung bases clear Hepatobiliary: Distended gallbladder 5.1 cm transverse. Cystic lesion LEFT lobe liver 18 x 14 mm. No definite biliary dilatation. Pancreas: Normal appearance Spleen: Normal appearance Adrenals/Urinary Tract: Adrenal glands, kidneys, ureters, and bladder normal appearance Stomach/Bowel: Diffuse thickening of small bowel loops. Colon decompressed with some areas demonstrating diffuse colonic wall thickening as well. These changes may reflect diffuse peritonitis rather than a primary bowel process. Significant thickening of the antral wall of the stomach extending to pylorus could be due to gastritis or ulcer disease though an infiltrative process is not excluded. No definite contrast extravasation identified but findings are most concerning for gastric origin of perforation. Duodenum shows no definite wall thickening. Vascular/Lymphatic: Atherosclerotic calcifications aorta and iliac arteries. No aortic aneurysm or adenopathy. 9 mm perigastric node image 32. Reproductive: Unremarkable uterus and adnexa Other: Significant free intraperitoneal air and fluid compatible with perforated viscus. Scattered interloop air and fluid. Scattered areas of peritoneal enhancement compatible with peritonitis. No definite discrete loculated abscess collection. Tiny supraumbilical ventral hernia containing a small amount of free fluid and free air. Musculoskeletal: Unremarkable IMPRESSION: Extensive free intraperitoneal air and fluid compatible with perforated viscus. Site/source of perforation is not localized but greatest degree of wall thickening is identified at the gastric antrum, potentially could represent perforated ulcer disease. Mild diffuse bowel wall thickening of large and small bowel loops throughout abdomen favoring reactive  changes secondary to peritonitis. Findings called to Dr.  Darl Householder on 07/20/2016 at 1440 hours. Electronically Signed   By: Lavonia Dana M.D.   On: 07/20/2016 14:43   Dg Abd Acute W/chest  Addendum Date: 07/20/2016   ADDENDUM REPORT: 07/20/2016 13:50 ADDENDUM: Critical Value/emergent results were called by telephone at the time of interpretation on 07/20/2016 at 1348 hours to Dr. Shirlyn Goltz , who verbally acknowledged these results. CT Abdomen and Pelvis is planned and pending. Electronically Signed   By: Genevie Ann M.D.   On: 07/20/2016 13:50   Result Date: 07/20/2016 CLINICAL DATA:  51 year old female with severe abdominal pain and vomiting for 2 days. Denies abdominal surgery. Initial encounter. EXAM: DG ABDOMEN ACUTE W/ 1V CHEST COMPARISON:  None. FINDINGS: Seated AP view of the chest is positive for pneumoperitoneum under the right hemidiaphragm which is also confirmed on the upright abdomen view. No pneumothorax. No pneumomediastinum is evident. Normal cardiac size and mediastinal contours. Visualized tracheal air column is within normal limits. The lungs appear clear. No pleural effusion. Unusual bowel-gas pattern including a central epigastric air-fluid level which seems unrelated to the stomach (arrow). On the upright abdomen view there is pneumoperitoneum under both hemidiaphragms. No other dilated loops. Relatively normal appearing distal small bowel and proximal colon gas. No acute osseous abnormality identified. IMPRESSION: 1. Positive for fairly large volume of free air in the abdomen indicating ruptured abdominal viscus. If the patient is stable enough CT Abdomen and Pelvis with oral and IV contrast is recommended. 2. Unusual epigastric bowel-gas pattern suspicious for dilated and abnormal upper abdominal bowel loop. 3.  No superimposed acute cardiopulmonary abnormality. Electronically Signed: By: Genevie Ann M.D. On: 07/20/2016 13:45    Assessment/Plan: 51 year old female status post exploratory laparotomy with repair of peptic ulcer 2. Doing well in the  immediate postoperative period. Repeat lactic acid pending. We'll adjust fluids if necessary after repeat lactic acid. Continue NG tube decompression. Continue acid suppression.   Clayburn Pert, MD [facility]  Day [facility] [phone] Night [facility] [phone]  07/20/2016

## 2016-07-20 NOTE — ED Notes (Signed)
Pt in CT.

## 2016-07-20 NOTE — Anesthesia Preprocedure Evaluation (Addendum)
Anesthesia Evaluation  Patient identified by MRN, date of birth, ID band Patient awake    Reviewed: Allergy & Precautions, NPO status , Patient's Chart, lab work & pertinent test results  History of Anesthesia Complications Negative for: history of anesthetic complications  Airway Mallampati: II       Dental   Pulmonary COPD,  COPD inhaler, Current Smoker,           Cardiovascular negative cardio ROS       Neuro/Psych negative neurological ROS     GI/Hepatic Neg liver ROS, PUD,   Endo/Other  negative endocrine ROS  Renal/GU negative Renal ROS     Musculoskeletal   Abdominal   Peds  Hematology negative hematology ROS (+)   Anesthesia Other Findings   Reproductive/Obstetrics                            Anesthesia Physical Anesthesia Plan  ASA: II and emergent  Anesthesia Plan: General   Post-op Pain Management:    Induction: Intravenous and Rapid sequence  Airway Management Planned: Oral ETT  Additional Equipment:   Intra-op Plan:   Post-operative Plan:   Informed Consent: I have reviewed the patients History and Physical, chart, labs and discussed the procedure including the risks, benefits and alternatives for the proposed anesthesia with the patient or authorized representative who has indicated his/her understanding and acceptance.     Plan Discussed with:   Anesthesia Plan Comments:         Anesthesia Quick Evaluation

## 2016-07-20 NOTE — Op Note (Addendum)
PROCEDURES: 1. Laparotomy with Repair of gastric ulcers x 2 using both omental and Falciform as a patch 2. Repair of Umbilical hernia  Pre-operative Diagnosis: Perforated Peptic ulcer  Post-operative Diagnosis: Same  Surgeon: Param Capri F Donne Robillard   Anesthesia: General endotracheal anesthesia  ASA Class: 2 E  Surgeon: Caroleen Hamman , MD FACS  Anesthesia: Gen. with endotracheal tube  Findings: Antral ulcer and Pyloric ulcer Purulent peritonitis [facility] Thickenning of the stomach and small bowel from purulent peritonitis  Estimated Blood Loss: 150cc         Drains: JP x 2             Complications: none                Procedure Details  The patient was seen again in the Holding Room. The benefits, complications, treatment options, and expected outcomes were discussed with the patient. The risks of bleeding, infection, recurrence of symptoms, failure to resolve symptoms,  bowel injury, any of which could require further surgery were reviewed with the patient.   The patient was taken to Operating Room, identified as Lindsey Wolf [facility] and the procedure verified.  A Time Out was held and the above information confirmed.  Prior to the induction of general anesthesia, antibiotic prophylaxis was administered. VTE prophylaxis was in place. General endotracheal anesthesia was then administered and tolerated well. After the induction, the abdomen was prepped with Chloraprep and draped in the sterile fashion. The patient was positioned in the supine position.   Laparotomy performed with a 10 blade knife and electrocautery used to dissect through subcutaneous Tissue. We entered the abdominal cavity after elevating the peritoneum. There was a foul-smelling ascitic fluid consistent with purulent peritonitis. There was at least a liter of purulent material within the abdominal cavity. We obviously suctioned this material and then perform some irrigation to weaken visualized intra-abdominal contents better. No  significant inflammatory response around the omentum and around the small bowel with exudative material. Initially it was not obviously the site of perforation and an I went ahead and as anesthesia team to put methylene blue via the NG tube after some finger fracturing of the omentum within the antrum I was able to visualize 2 perforations one in the distal antrum and the other one in the pyloric region. There was a small amount of fat seen 200 silks I close both defects and reinforcement my repair using the greater omentum for the antral ulcer and since the patient had reduced amount of omentum due to her body habitus I went ahead and mobilized the falciform ligament and reinforcement the pyloric ulcer repair with the falciform ligament. Irrigated the Abdominal cavity with a few liters of normal saline until the output was clear. I also proceeded to open the sac and did not find any posterior ulcerations. There was no evidence of any leakage of contrast or abdominal contents after the repair of the ulcers. I did decided not to perform a biopsy because there was significant fragility and I was afraid that the defect was could be 2 large nevi were to absent. 2 large 19 Blake drains were laid on the right and the left side on top of the repair going towards the abdominal dome of the liver and towards the spleen.   I closed the abdomen with a 0 PDS suture in a running fashion  ( we incorporated the repair of the umbilical defect within our closure in the standard fashion) and the skin was closed with staples.  Liposomal Marcaine was injected along the l incision under direct visualization. Needle and laparotomy count were correct and there were no immediate occasions. Patient will be transferred to the stepdown unit since she is  still showing some signs of sepsis and with some limited urine output. She has been aggressively resuscitated with crystalloids and colloids.  Caroleen Hamman, MD, FACS

## 2016-07-20 NOTE — Transfer of Care (Signed)
Immediate Anesthesia Transfer of Care Note  Patient: Lindsey Wolf  Procedure(s) Performed: Procedure(s): EXPLORATORY LAPAROTOMY (N/A)  Patient Location: PACU  Anesthesia Type:General  Level of Consciousness: patient cooperative and responds to stimulation  Airway & Oxygen Therapy: Patient Spontanous Breathing and Patient connected to nasal cannula oxygen  Post-op Assessment: Report given to RN and Post -op Vital signs reviewed and stable  Post vital signs: Reviewed and stable  Last Vitals:  Vitals:   07/20/16 1500 07/20/16 1815  BP: 100/80 (!) 119/49  Pulse: 61 93  Resp: 20 15  Temp:  36.8 C    Last Pain:  Vitals:   07/20/16 1815  TempSrc: Temporal  PainSc: 0-No pain         Complications: No apparent anesthesia complications

## 2016-07-20 NOTE — ED Notes (Signed)
Orderly transporting pt now to OR

## 2016-07-21 ENCOUNTER — Inpatient Hospital Stay: Payer: BLUE CROSS/BLUE SHIELD

## 2016-07-21 LAB — BASIC METABOLIC PANEL
ANION GAP: 9 (ref 5–15)
BUN: 15 mg/dL (ref 6–20)
CO2: 21 mmol/L — AB (ref 22–32)
Calcium: 8 mg/dL — ABNORMAL LOW (ref 8.9–10.3)
Chloride: 104 mmol/L (ref 101–111)
Creatinine, Ser: 0.76 mg/dL (ref 0.44–1.00)
GFR calc Af Amer: >60 mL/min (ref >60)
GLUCOSE: 154 mg/dL — AB (ref 65–99)
POTASSIUM: 2.8 mmol/L — AB (ref 3.5–5.1)
Sodium: 134 mmol/L — ABNORMAL LOW (ref 135–145)

## 2016-07-21 LAB — CBC
HCT: 27.6 % — ABNORMAL LOW (ref 35.0–47.0)
Hemoglobin: 8.7 g/dL — ABNORMAL LOW (ref 12.0–16.0)
MCH: 22.1 pg — ABNORMAL LOW (ref 26.0–34.0)
MCHC: 31.7 g/dL — ABNORMAL LOW (ref 32.0–36.0)
MCV: 69.6 fL — AB (ref 80.0–100.0)
Platelets: 321 10*3/uL (ref 150–440)
RBC: 3.97 MIL/uL (ref 3.80–5.20)
RDW: 22.8 % — ABNORMAL HIGH (ref 11.5–14.5)
WBC: 6.4 10*3/uL (ref 3.6–11.0)

## 2016-07-21 LAB — PHOSPHORUS: Phosphorus: 2.6 mg/dL (ref 2.5–4.6)

## 2016-07-21 LAB — MAGNESIUM: Magnesium: 1.5 mg/dL — ABNORMAL LOW (ref 1.7–2.4)

## 2016-07-21 MED ORDER — SODIUM CHLORIDE 0.9 % IV SOLN
30.0000 meq | Freq: Once | INTRAVENOUS | Status: AC
  Administered 2016-07-21: 30 meq via INTRAVENOUS
  Filled 2016-07-21: qty 15

## 2016-07-21 MED ORDER — STERILE WATER FOR INJECTION IJ SOLN
INTRAMUSCULAR | Status: AC
  Administered 2016-07-21: 10 mL
  Filled 2016-07-21: qty 10

## 2016-07-21 MED ORDER — HYDROMORPHONE HCL 1 MG/ML IJ SOLN
0.5000 mg | INTRAMUSCULAR | Status: DC | PRN
  Administered 2016-07-21 – 2016-08-12 (×48): 0.5 mg via INTRAVENOUS
  Filled 2016-07-21 (×49): qty 0.5

## 2016-07-21 MED ORDER — MAGNESIUM SULFATE 2 GM/50ML IV SOLN
2.0000 g | Freq: Once | INTRAVENOUS | Status: AC
  Administered 2016-07-21: 2 g via INTRAVENOUS
  Filled 2016-07-21 (×2): qty 50

## 2016-07-21 MED ORDER — MAGNESIUM SULFATE 2 GM/50ML IV SOLN
2.0000 g | Freq: Once | INTRAVENOUS | Status: AC
  Administered 2016-07-21: 2 g via INTRAVENOUS
  Filled 2016-07-21: qty 50

## 2016-07-21 NOTE — Progress Notes (Signed)
Per Dr Dahlia Byes order dilaudid for pain not relieved with morphine

## 2016-07-21 NOTE — Progress Notes (Signed)
Report called to [facility] on 2C, pt will transport in [facility] bed, no O2 required.  Her chart, meds, and belongings will go with her

## 2016-07-21 NOTE — Anesthesia Postprocedure Evaluation (Signed)
Anesthesia Post Note  Patient: Lindsey Wolf  Procedure(s) Performed: Procedure(s) (LRB): EXPLORATORY LAPAROTOMY (N/A)  Patient location during evaluation: PACU Anesthesia Type: General Level of consciousness: awake and alert Pain management: pain level controlled Vital Signs Assessment: post-procedure vital signs reviewed and stable Respiratory status: spontaneous breathing and respiratory function stable Cardiovascular status: stable Anesthetic complications: no     Last Vitals:  Vitals:   07/21/16 0200 07/21/16 0300  BP: 112/74 114/81  Pulse: 83 85  Resp: 18 18  Temp:      Last Pain:  Vitals:   07/21/16 0100  TempSrc: Oral  PainSc: Asleep                 KEPHART,WILLIAM K

## 2016-07-21 NOTE — Progress Notes (Signed)
POD # 1 perf ulcer Doing much better, tahy resolving adequately resuscitated from sepsis AVSS Good U/O   PE NAD Abd: serous output JP, appropiate tenderness. No peritonitis or infection Ext: no edema  A/P Doing better continue A/Bs, PPI, NGT Transfer to floor Replace k and mag

## 2016-07-22 LAB — CBC
HCT: 25.8 % — ABNORMAL LOW (ref 35.0–47.0)
Hemoglobin: 8.2 g/dL — ABNORMAL LOW (ref 12.0–16.0)
MCH: 21.9 pg — AB (ref 26.0–34.0)
MCHC: 32 g/dL (ref 32.0–36.0)
MCV: 68.5 fL — ABNORMAL LOW (ref 80.0–100.0)
PLATELETS: 270 10*3/uL (ref 150–440)
RBC: 3.76 MIL/uL — AB (ref 3.80–5.20)
RDW: 22.5 % — ABNORMAL HIGH (ref 11.5–14.5)
WBC: 10 10*3/uL (ref 3.6–11.0)

## 2016-07-22 LAB — BASIC METABOLIC PANEL
Anion gap: 7 (ref 5–15)
BUN: 12 mg/dL (ref 6–20)
CO2: 25 mmol/L (ref 22–32)
Calcium: 7.7 mg/dL — ABNORMAL LOW (ref 8.9–10.3)
Chloride: 105 mmol/L (ref 101–111)
Creatinine, Ser: 0.54 mg/dL (ref 0.44–1.00)
GFR calc Af Amer: >60 mL/min (ref >60)
GFR calc non Af Amer: >60 mL/min (ref >60)
Glucose, Bld: 113 mg/dL — ABNORMAL HIGH (ref 65–99)
POTASSIUM: 2.7 mmol/L — AB (ref 3.5–5.1)
SODIUM: 137 mmol/L (ref 135–145)

## 2016-07-22 LAB — HIV ANTIBODY (ROUTINE TESTING W REFLEX): HIV Screen 4th Generation wRfx: NONREACTIVE

## 2016-07-22 LAB — POTASSIUM: Potassium: 3.1 mmol/L — ABNORMAL LOW (ref 3.5–5.1)

## 2016-07-22 LAB — MAGNESIUM: MAGNESIUM: 1.7 mg/dL (ref 1.7–2.4)

## 2016-07-22 MED ORDER — SODIUM CHLORIDE 0.9 % IV SOLN
30.0000 meq | Freq: Once | INTRAVENOUS | Status: AC
  Administered 2016-07-22: 30 meq via INTRAVENOUS
  Filled 2016-07-22: qty 15

## 2016-07-22 MED ORDER — SODIUM CHLORIDE 0.9 % IV SOLN
30.0000 meq | Freq: Once | INTRAVENOUS | Status: DC
  Filled 2016-07-22: qty 15

## 2016-07-22 MED ORDER — MAGNESIUM SULFATE 2 GM/50ML IV SOLN
2.0000 g | Freq: Once | INTRAVENOUS | Status: AC
  Administered 2016-07-22: 2 g via INTRAVENOUS
  Filled 2016-07-22: qty 50

## 2016-07-22 NOTE — Progress Notes (Signed)
POD # 2 Repair perf ulcer Doing very well Good U/O Pain improving Low K and Mag AVSS 317 cc JP  PE NAD Abd: incisions c/d/i, no infection JP serous output. No peritonitis Ext: well perfused and no edema  A/P Doing well Plan to keep NGT today perform contrast study in am if no leak may pull NGT DC foley Continue a/b for purulent peritonitis Mobilize Replace lytes

## 2016-07-22 NOTE — Progress Notes (Signed)
Initial Nutrition Assessment  DOCUMENTATION CODES:   Not applicable  INTERVENTION:  1. Monitor for diet advancement per surgery  NUTRITION DIAGNOSIS:   Inadequate oral intake related to inability to eat as evidenced by NPO status.  GOAL:   Patient will meet greater than or equal to 90% of their needs  MONITOR:   I & O's, Diet advancement, Labs, Weight trends, Supplement acceptance  REASON FOR ASSESSMENT:   Malnutrition Screening Tool    ASSESSMENT:   Lindsey Wolf is a 51 y.o. female with a 3 days hx of abdominal pain in the epigastric area and she thinks it was a gas type. Pain is moderate to severe in intensity and has worsening today.  Spoke with patient at bedside. She reports No PO intake x2 days PTA.  Reports 60# wt loss over 1 year - started off intentional but "got out of control." Prior to that, was eating 3 small meals per day + snacks in between. Currently has NGT to suction - blue-green suction currently. Mild nausea reported. Consuming ice chips. Nutrition-Focused physical exam completed. Findings are moderate fat depletion, no muscle depletion, and no edema.  Labs and medications reviewed: K 2.7 D5-LR @ 155mL/hr --> 408 calories   Diet Order:  Diet NPO time specified Except for: Ice Chips  Skin:  Reviewed, no issues  Last BM:  PTA  Height:   Ht Readings from Last 1 Encounters:  07/20/16 5\' 10"  (1.778 m)    Weight:   Wt Readings from Last 1 Encounters:  07/20/16 129 lb 10.1 oz (58.8 kg)    Ideal Body Weight:  68.18 kg  BMI:  Body mass index is 18.6 kg/m.  Estimated Nutritional Needs:   Kcal:  1500-1800 calories  Protein:  70-82 gm  Fluid:  >/= 1.5L  EDUCATION NEEDS:   No education needs identified at this time  [name]. Addis Bennie, MS, RD LDN Inpatient Clinical Dietitian Pager [phone]

## 2016-07-23 ENCOUNTER — Inpatient Hospital Stay: Payer: BLUE CROSS/BLUE SHIELD

## 2016-07-23 ENCOUNTER — Encounter: Payer: Self-pay | Admitting: Surgery

## 2016-07-23 LAB — BASIC METABOLIC PANEL
ANION GAP: 6 (ref 5–15)
BUN: 14 mg/dL (ref 6–20)
CALCIUM: 7.7 mg/dL — AB (ref 8.9–10.3)
CO2: 27 mmol/L (ref 22–32)
Chloride: 106 mmol/L (ref 101–111)
Creatinine, Ser: 0.44 mg/dL (ref 0.44–1.00)
Glucose, Bld: 104 mg/dL — ABNORMAL HIGH (ref 65–99)
POTASSIUM: 2.7 mmol/L — AB (ref 3.5–5.1)
Sodium: 139 mmol/L (ref 135–145)

## 2016-07-23 LAB — CBC
HEMATOCRIT: 24.1 % — AB (ref 35.0–47.0)
HEMOGLOBIN: 7.9 g/dL — AB (ref 12.0–16.0)
MCH: 22.1 pg — AB (ref 26.0–34.0)
MCHC: 32.7 g/dL (ref 32.0–36.0)
MCV: 67.7 fL — AB (ref 80.0–100.0)
Platelets: 292 10*3/uL (ref 150–440)
RBC: 3.55 MIL/uL — ABNORMAL LOW (ref 3.80–5.20)
RDW: 21.6 % — AB (ref 11.5–14.5)
WBC: 14.5 10*3/uL — ABNORMAL HIGH (ref 3.6–11.0)

## 2016-07-23 LAB — MAGNESIUM: MAGNESIUM: 1.8 mg/dL (ref 1.7–2.4)

## 2016-07-23 LAB — PHOSPHORUS: PHOSPHORUS: 1.9 mg/dL — AB (ref 2.5–4.6)

## 2016-07-23 MED ORDER — KCL IN DEXTROSE-NACL 30-5-0.45 MEQ/L-%-% IV SOLN
INTRAVENOUS | Status: DC
  Administered 2016-07-23 – 2016-07-25 (×5): via INTRAVENOUS
  Filled 2016-07-23 (×7): qty 1000

## 2016-07-23 MED ORDER — SODIUM CHLORIDE 0.9 % IV SOLN
30.0000 meq | Freq: Once | INTRAVENOUS | Status: AC
  Administered 2016-07-23: 30 meq via INTRAVENOUS
  Filled 2016-07-23: qty 15

## 2016-07-23 MED ORDER — ALPRAZOLAM 0.25 MG PO TABS
0.2500 mg | ORAL_TABLET | Freq: Three times a day (TID) | ORAL | Status: DC | PRN
  Administered 2016-07-23 – 2016-07-29 (×3): 0.25 mg via ORAL
  Filled 2016-07-23 (×4): qty 1

## 2016-07-23 MED ORDER — IOPAMIDOL (ISOVUE-300) INJECTION 61%
150.0000 mL | Freq: Once | INTRAVENOUS | Status: AC | PRN
  Administered 2016-07-23: 150 mL

## 2016-07-23 NOTE — Progress Notes (Signed)
Patient not currently on floor. In radiology for upper GI. We'll see later today.

## 2016-07-23 NOTE — Progress Notes (Signed)
Per Dr. Burt Knack okay to place order for patients home dose of xanax 0.25mg  q 8 hours as needed and to take with a sip of water and then hook up NG after 15 minutes

## 2016-07-23 NOTE — Progress Notes (Signed)
3 Days Post-Op  Subjective: Status post Graham's patch for perforated ulcer. Patient had difficulty in the radiology suite being asked to move about but feels better overall.  Objective: Vital signs in last 24 hours: Temp:  [97.7 F (36.5 C)-98 F (36.7 C)] 97.7 F (36.5 C) (02/19 0527) Pulse Rate:  [81-91] 81 (02/19 0527) Resp:  [16-18] 18 (02/19 0527) BP: (122-132)/(75-81) 132/81 (02/19 0527) SpO2:  [94 %-96 %] 95 % (02/19 0527) Last BM Date: 07/19/16  Intake/Output from previous day: 02/18 0701 - 02/19 0700 In: 3192 [I.V.:2385; IV Piggyback:807] Out: 1020 [Urine:500; Emesis/NG output:450; Drains:70] Intake/Output this shift: Total I/O In: 209 [I.V.:194; IV Piggyback:15] Out: 50 [Emesis/NG output:50]  Physical exam:  Vital signs are stable and reviewed. No acute distress NG in place Abdomen is soft and nontender wound is clean no bile in drains  Lab Results: CBC   Recent Labs  07/22/16 0411 07/23/16 0330  WBC 10.0 14.5*  HGB 8.2* 7.9*  HCT 25.8* 24.1*  PLT 270 292   BMET  Recent Labs  07/22/16 0411 07/22/16 1436 07/23/16 0330  NA 137  --  139  K 2.7* 3.1* 2.7*  CL 105  --  106  CO2 25  --  27  GLUCOSE 113*  --  104*  BUN 12  --  14  CREATININE 0.54  --  0.44  CALCIUM 7.7*  --  7.7*   PT/INR  Recent Labs  07/20/16 1507  LABPROT 15.2  INR 1.19   ABG No results for input(s): PHART, HCO3 in the last 72 hours.  Invalid input(s): PCO2, PO2  Studies/Results: Dg Ugi W/water Sol Cm  Result Date: 07/23/2016 CLINICAL DATA:  Perforated ulcers. EXAM: WATER SOLUBLE UPPER GI SERIES TECHNIQUE: Single-column upper GI series was performed using water soluble contrast. CONTRAST:  136mL ISOVUE-300 IOPAMIDOL (ISOVUE-300) INJECTION 61% COMPARISON:  KUB 07/21/2016.  CT 07/20/2016. FLUOROSCOPY TIME:  Fluoroscopy Time:  1 minutes 12 seconds. Radiation Exposure Index (if provided by the fluoroscopic device): 24.2 mGy Number of Acquired Spot Images: 10 FINDINGS: NG  tube noted good position. Standard were soluble upper GI performed through the NG tube. Dilute Isovue-300 was utilized. Exam was limited as the patient would not stay on the table but for a limited time. Thickened antral and duodenal folds are noted. Contrast extravasation is noted arising from the region of the antrum and duodenum. Multiple sites of perforation cannot be excluded. Prominent gastroesophageal reflux. IMPRESSION: 1. Limited exam. Contrast extravasation is noted arising from the region of the antrum and duodenum. Multiple sites of perforation cannot be excluded . 2. NG tube noted in good anatomic position. Prominent gastroesophageal reflux. Electronically Signed   By: Marcello Moores  Register   On: 07/23/2016 08:50    Anti-infectives: Anti-infectives    Start     Dose/Rate Route Frequency Ordered Stop   07/21/16 0000  piperacillin-tazobactam (ZOSYN) IVPB 3.375 g     3.375 g 12.5 mL/hr over 240 Minutes Intravenous Every 8 hours 07/20/16 1809     07/20/16 1430  piperacillin-tazobactam (ZOSYN) IVPB 3.375 g     3.375 g 100 mL/hr over 30 Minutes Intravenous  Once 07/20/16 1424 07/20/16 1603      Assessment/Plan: s/p Procedure(s): EXPLORATORY LAPAROTOMY   Potassium is 2.7 Will correct. Films are personally reviewed there is suggestion of contrast extravasation as read by the radiologist. Burnis Medin continue nasogastric tube at this point until spontaneous healing. Will repeat upper GI at the end of week.  Florene Glen, MD, FACS  07/23/2016  

## 2016-07-24 LAB — BASIC METABOLIC PANEL
Anion gap: 7 (ref 5–15)
BUN: 11 mg/dL (ref 6–20)
CHLORIDE: 105 mmol/L (ref 101–111)
CO2: 25 mmol/L (ref 22–32)
Calcium: 7.9 mg/dL — ABNORMAL LOW (ref 8.9–10.3)
Creatinine, Ser: 0.43 mg/dL — ABNORMAL LOW (ref 0.44–1.00)
GFR calc non Af Amer: >60 mL/min (ref >60)
Glucose, Bld: 135 mg/dL — ABNORMAL HIGH (ref 65–99)
POTASSIUM: 3.2 mmol/L — AB (ref 3.5–5.1)
SODIUM: 137 mmol/L (ref 135–145)

## 2016-07-24 LAB — CBC WITH DIFFERENTIAL/PLATELET
BASOS PCT: 0 %
Basophils Absolute: 0 10*3/uL (ref 0–0.1)
EOS ABS: 0 10*3/uL (ref 0–0.7)
Eosinophils Relative: 0 %
HEMATOCRIT: 28.7 % — AB (ref 35.0–47.0)
HEMOGLOBIN: 9.2 g/dL — AB (ref 12.0–16.0)
Lymphocytes Relative: 2 %
Lymphs Abs: 0.6 10*3/uL — ABNORMAL LOW (ref 1.0–3.6)
MCH: 22 pg — ABNORMAL LOW (ref 26.0–34.0)
MCHC: 32.2 g/dL (ref 32.0–36.0)
MCV: 68.3 fL — ABNORMAL LOW (ref 80.0–100.0)
MONOS PCT: 5 %
Monocytes Absolute: 1.4 10*3/uL — ABNORMAL HIGH (ref 0.2–0.9)
NEUTROS ABS: 24.9 10*3/uL — AB (ref 1.4–6.5)
NEUTROS PCT: 93 %
Platelets: 334 10*3/uL (ref 150–440)
RBC: 4.2 MIL/uL (ref 3.80–5.20)
RDW: 21.7 % — ABNORMAL HIGH (ref 11.5–14.5)
WBC: 26.9 10*3/uL — AB (ref 3.6–11.0)

## 2016-07-24 LAB — GLUCOSE, CAPILLARY: GLUCOSE-CAPILLARY: 118 mg/dL — AB (ref 65–99)

## 2016-07-24 NOTE — Progress Notes (Signed)
While rounding the unit, the Nurse asked the [facility] to visit the Pt. Pt appeared calm, but talked about her son. Pt talked about interpersonal relationship, and was particularly concerned about her grandmother has cancer. Pt stated that she was looking forward to be discharged. Pt declined prayer, but was appreciative of CH's visit.     07/24/16 1600  Clinical Encounter Type  Visited With Patient  Visit Type Initial;Spiritual support  Referral From Nurse  Consult/Referral To Chaplain  Spiritual Encounters  Spiritual Needs Prayer;Other (Comment)

## 2016-07-24 NOTE — Progress Notes (Signed)
4 Days Post-Op  Subjective: Patient feels better today no nausea vomiting she is hungry she wants her nasogastric tube out. Yesterday's studies suggested extravasation and leak from the repair.  Objective: Vital signs in last 24 hours: Temp:  [97.9 F (36.6 C)-98.1 F (36.7 C)] 98.1 F (36.7 C) (02/20 0350) Pulse Rate:  [72-106] 106 (02/20 0350) Resp:  [20] 20 (02/20 0350) BP: (133-140)/(75-90) 140/90 (02/20 0350) SpO2:  [96 %-98 %] 98 % (02/20 0350) Last BM Date: 07/19/16  Intake/Output from previous day: 02/19 0701 - 02/20 0700 In: 3053 [I.V.:2726; IV Piggyback:327] Out: 1730 [Urine:1100; Emesis/NG output:550; Drains:80] Intake/Output this shift: Total I/O In: 445 [I.V.:429; IV Piggyback:16] Out: 150 [Emesis/NG output:150]  Physical exam:  Patient is afebrile no acute distress vital signs are stable Abdomen is soft nontender wound is clean drains are in place with no bilious output. Abs are nontender  Lab Results: CBC   Recent Labs  07/23/16 0330 07/24/16 0454  WBC 14.5* 26.9*  HGB 7.9* 9.2*  HCT 24.1* 28.7*  PLT 292 334   BMET  Recent Labs  07/23/16 0330 07/24/16 0454  NA 139 137  K 2.7* 3.2*  CL 106 105  CO2 27 25  GLUCOSE 104* 135*  BUN 14 11  CREATININE 0.44 0.43*  CALCIUM 7.7* 7.9*   PT/INR No results for input(s): LABPROT, INR in the last 72 hours. ABG No results for input(s): PHART, HCO3 in the last 72 hours.  Invalid input(s): PCO2, PO2  Studies/Results: Dg Ugi W/water Sol Cm  Result Date: 07/23/2016 CLINICAL DATA:  Perforated ulcers. EXAM: WATER SOLUBLE UPPER GI SERIES TECHNIQUE: Single-column upper GI series was performed using water soluble contrast. CONTRAST:  186mL ISOVUE-300 IOPAMIDOL (ISOVUE-300) INJECTION 61% COMPARISON:  KUB 07/21/2016.  CT 07/20/2016. FLUOROSCOPY TIME:  Fluoroscopy Time:  1 minutes 12 seconds. Radiation Exposure Index (if provided by the fluoroscopic device): 24.2 mGy Number of Acquired Spot Images: 10 FINDINGS:  NG tube noted good position. Standard were soluble upper GI performed through the NG tube. Dilute Isovue-300 was utilized. Exam was limited as the patient would not stay on the table but for a limited time. Thickened antral and duodenal folds are noted. Contrast extravasation is noted arising from the region of the antrum and duodenum. Multiple sites of perforation cannot be excluded. Prominent gastroesophageal reflux. IMPRESSION: 1. Limited exam. Contrast extravasation is noted arising from the region of the antrum and duodenum. Multiple sites of perforation cannot be excluded . 2. NG tube noted in good anatomic position. Prominent gastroesophageal reflux. Electronically Signed   By: Marcello Moores  Register   On: 07/23/2016 08:50    Anti-infectives: Anti-infectives    Start     Dose/Rate Route Frequency Ordered Stop   07/21/16 0000  piperacillin-tazobactam (ZOSYN) IVPB 3.375 g     3.375 g 12.5 mL/hr over 240 Minutes Intravenous Every 8 hours 07/20/16 1809     07/20/16 1430  piperacillin-tazobactam (ZOSYN) IVPB 3.375 g     3.375 g 100 mL/hr over 30 Minutes Intravenous  Once 07/20/16 1424 07/20/16 1603      Assessment/Plan: s/p Procedure(s): EXPLORATORY LAPAROTOMY   Patient is afebrile but white blood cell count is markedly elevated. Continue to observe at this time would continue the nasogastric tube and probably repeat the upper GI study towards the end of the week continue IV antibiotics at this point for extravasation.  Florene Glen, MD, FACS  07/24/2016

## 2016-07-24 NOTE — Progress Notes (Signed)
Notified Dr. Burt Knack that patient had an increase in drainage with 51ml in right JP drain and 163ml of serous fluid in L JP drain. No New orders received will continue to monitor.

## 2016-07-25 ENCOUNTER — Inpatient Hospital Stay: Payer: BLUE CROSS/BLUE SHIELD

## 2016-07-25 LAB — CULTURE, BLOOD (ROUTINE X 2): CULTURE: NO GROWTH

## 2016-07-25 LAB — BASIC METABOLIC PANEL
ANION GAP: 6 (ref 5–15)
BUN: 11 mg/dL (ref 6–20)
CHLORIDE: 105 mmol/L (ref 101–111)
CO2: 27 mmol/L (ref 22–32)
CREATININE: 0.44 mg/dL (ref 0.44–1.00)
Calcium: 7.7 mg/dL — ABNORMAL LOW (ref 8.9–10.3)
GFR calc Af Amer: >60 mL/min (ref >60)
GFR calc non Af Amer: >60 mL/min (ref >60)
Glucose, Bld: 104 mg/dL — ABNORMAL HIGH (ref 65–99)
POTASSIUM: 3.4 mmol/L — AB (ref 3.5–5.1)
SODIUM: 138 mmol/L (ref 135–145)

## 2016-07-25 LAB — CBC WITH DIFFERENTIAL/PLATELET
BASOS ABS: 0 10*3/uL (ref 0–0.1)
Basophils Relative: 0 %
EOS ABS: 0 10*3/uL (ref 0–0.7)
Eosinophils Relative: 0 %
HCT: 26.7 % — ABNORMAL LOW (ref 35.0–47.0)
HEMOGLOBIN: 8.7 g/dL — AB (ref 12.0–16.0)
LYMPHS PCT: 3 %
Lymphs Abs: 0.8 10*3/uL — ABNORMAL LOW (ref 1.0–3.6)
MCH: 22.2 pg — ABNORMAL LOW (ref 26.0–34.0)
MCHC: 32.5 g/dL (ref 32.0–36.0)
MCV: 68.4 fL — ABNORMAL LOW (ref 80.0–100.0)
MONOS PCT: 4 %
Monocytes Absolute: 1.1 10*3/uL — ABNORMAL HIGH (ref 0.2–0.9)
NEUTROS PCT: 93 %
Neutro Abs: 24.6 10*3/uL — ABNORMAL HIGH (ref 1.4–6.5)
Platelets: 355 10*3/uL (ref 150–440)
RBC: 3.9 MIL/uL (ref 3.80–5.20)
RDW: 21.2 % — ABNORMAL HIGH (ref 11.5–14.5)
WBC: 26.5 10*3/uL — ABNORMAL HIGH (ref 3.6–11.0)

## 2016-07-25 LAB — GLUCOSE, CAPILLARY
GLUCOSE-CAPILLARY: 96 mg/dL (ref 65–99)
GLUCOSE-CAPILLARY: 76 mg/dL (ref 65–99)
Glucose-Capillary: 97 mg/dL (ref 65–99)

## 2016-07-25 LAB — MAGNESIUM: MAGNESIUM: 1.6 mg/dL — AB (ref 1.7–2.4)

## 2016-07-25 LAB — PHOSPHORUS: Phosphorus: 2.3 mg/dL — ABNORMAL LOW (ref 2.5–4.6)

## 2016-07-25 MED ORDER — SODIUM CHLORIDE 0.45 % IV SOLN
INTRAVENOUS | Status: DC
  Administered 2016-07-25 – 2016-07-29 (×5): via INTRAVENOUS
  Filled 2016-07-25 (×9): qty 1000

## 2016-07-25 MED ORDER — FENTANYL CITRATE (PF) 100 MCG/2ML IJ SOLN
INTRAMUSCULAR | Status: AC
  Filled 2016-07-25: qty 4

## 2016-07-25 MED ORDER — MIDAZOLAM HCL 5 MG/5ML IJ SOLN
INTRAMUSCULAR | Status: AC | PRN
  Administered 2016-07-25 (×2): 0.5 mg via INTRAVENOUS
  Administered 2016-07-25: 1 mg via INTRAVENOUS
  Administered 2016-07-25: 0.5 mg via INTRAVENOUS

## 2016-07-25 MED ORDER — SODIUM CHLORIDE 0.9 % IV SOLN
INTRAVENOUS | Status: DC
  Administered 2016-07-25: 14:00:00 via INTRAVENOUS

## 2016-07-25 MED ORDER — FENTANYL CITRATE (PF) 100 MCG/2ML IJ SOLN
INTRAMUSCULAR | Status: AC | PRN
  Administered 2016-07-25: 50 ug via INTRAVENOUS
  Administered 2016-07-25: 25 ug via INTRAVENOUS

## 2016-07-25 MED ORDER — MIDAZOLAM HCL 5 MG/5ML IJ SOLN
INTRAMUSCULAR | Status: AC
  Filled 2016-07-25: qty 5

## 2016-07-25 MED ORDER — MAGNESIUM SULFATE 2 GM/50ML IV SOLN
2.0000 g | Freq: Once | INTRAVENOUS | Status: AC
Start: 2016-07-25 — End: 2016-07-25
  Administered 2016-07-25: 2 g via INTRAVENOUS
  Filled 2016-07-25: qty 50

## 2016-07-25 MED ORDER — HEPARIN SOD (PORK) LOCK FLUSH 100 UNIT/ML IV SOLN
INTRAVENOUS | Status: AC
  Filled 2016-07-25: qty 5

## 2016-07-25 MED ORDER — KCL IN DEXTROSE-NACL 40-5-0.45 MEQ/L-%-% IV SOLN
INTRAVENOUS | Status: AC
  Administered 2016-07-25: 07:00:00 via INTRAVENOUS
  Filled 2016-07-25 (×3): qty 1000

## 2016-07-25 MED ORDER — POTASSIUM CHLORIDE 2 MEQ/ML IV SOLN
30.0000 meq | Freq: Once | INTRAVENOUS | Status: AC
  Administered 2016-07-25: 30 meq via INTRAVENOUS
  Filled 2016-07-25: qty 15

## 2016-07-25 MED ORDER — TRACE MINERALS CR-CU-MN-SE-ZN 10-1000-500-60 MCG/ML IV SOLN
INTRAVENOUS | Status: AC
  Administered 2016-07-25: 18:00:00 via INTRAVENOUS
  Filled 2016-07-25: qty 912

## 2016-07-25 MED ORDER — MAGNESIUM SULFATE 4 GM/100ML IV SOLN
4.0000 g | Freq: Once | INTRAVENOUS | Status: DC
  Filled 2016-07-25: qty 100

## 2016-07-25 MED ORDER — DEXTROSE 5 % IV SOLN
10.0000 mmol | Freq: Once | INTRAVENOUS | Status: AC
  Administered 2016-07-25: 10 mmol via INTRAVENOUS
  Filled 2016-07-25: qty 3.33

## 2016-07-25 MED ORDER — IOPAMIDOL (ISOVUE-300) INJECTION 61%: 15.0000 mL | INTRAVENOUS | Status: AC

## 2016-07-25 MED ORDER — IOPAMIDOL (ISOVUE-300) INJECTION 61%
100.0000 mL | Freq: Once | INTRAVENOUS | Status: AC | PRN
  Administered 2016-07-25: 100 mL via INTRAVENOUS

## 2016-07-25 MED ORDER — INSULIN ASPART 100 UNIT/ML ~~LOC~~ SOLN
0.0000 [IU] | SUBCUTANEOUS | Status: DC
  Administered 2016-07-27 – 2016-07-29 (×5): 1 [IU] via SUBCUTANEOUS
  Administered 2016-07-29 – 2016-07-30 (×4): 2 [IU] via SUBCUTANEOUS
  Administered 2016-07-30: 1 [IU] via SUBCUTANEOUS
  Administered 2016-07-30 (×2): 2 [IU] via SUBCUTANEOUS
  Administered 2016-07-31 – 2016-08-01 (×6): 1 [IU] via SUBCUTANEOUS
  Administered 2016-08-02: 2 [IU] via SUBCUTANEOUS
  Administered 2016-08-02 – 2016-08-03 (×3): 1 [IU] via SUBCUTANEOUS
  Administered 2016-08-03: 2 [IU] via SUBCUTANEOUS
  Administered 2016-08-03 – 2016-08-09 (×11): 1 [IU] via SUBCUTANEOUS
  Filled 2016-07-25 (×3): qty 1
  Filled 2016-07-25: qty 2
  Filled 2016-07-25: qty 1
  Filled 2016-07-25: qty 2
  Filled 2016-07-25 (×10): qty 1
  Filled 2016-07-25: qty 2
  Filled 2016-07-25: qty 1
  Filled 2016-07-25: qty 2
  Filled 2016-07-25 (×3): qty 1
  Filled 2016-07-25: qty 2
  Filled 2016-07-25 (×2): qty 1
  Filled 2016-07-25: qty 2
  Filled 2016-07-25 (×3): qty 1
  Filled 2016-07-25 (×2): qty 2
  Filled 2016-07-25 (×6): qty 1

## 2016-07-25 NOTE — Progress Notes (Signed)
PHARMACY - ADULT TOTAL PARENTERAL NUTRITION CONSULT NOTE   Pharmacy Consult for TPN Indication: Perforated ulcer  Patient Measurements: Height: 5\' 10"  (177.8 cm) Weight: 129 lb 10.1 oz (58.8 kg) IBW/kg (Calculated) : 68.5 TPN AdjBW (KG): 58.8 Body mass index is 18.6 kg/m. Usual Weight:   Assessment: Pharmacy consulted to assist in the management of electrolytes and glucose in this 51 year old female being starting on parenteral nutrition.   GI:  Endo:  Insulin requirements in the past 24 hours:  Lytes:K= 3.4; Magnesium: 1.6, Phos: 2.3  Renal: Pulm: Cards:  Hepatobil: Neuro: ID:  Best Practices: TPN Access: TPN start date:  Nutritional Goals (per RD recommendation on ): kCal: Protein:   Current Nutrition:   Plan:   Clinimix E5/15 at 38ml/hr with Trace elements and MVI   10 MVI and trace elements in TPN SSI ordered q4h checks.   K and Magnesium have been replaced by MD.   Will give KPhos 10 mmol IV x 1. Will recheck TPN labs in am  Wandra Babin D 07/25/2016,1:09 PM

## 2016-07-25 NOTE — Progress Notes (Signed)
Initial Nutrition Assessment  DOCUMENTATION CODES:   Not applicable  INTERVENTION:  1. Begin 5%AA/15%Dextrose infusion via PICC @ 50mL/hr 2. Replete Mg, Phos, K aggressively - patient at risk for refeeding. 3. Goal rate for TPN is 31mL/hr 4. If triglycerides are normal and electrolytes/CBGs WNL by 2/23 - provide 20% lipid emulsion x 12 hrs 5. At goal, provides 1758 calories, 90gm protein, 2040cc total volume 6. Received verbal order to d/c D5; continue 1/2 NS w/ 52mEq KCL  NUTRITION DIAGNOSIS:   Inadequate oral intake related to inability to eat as evidenced by NPO status. -ongoing  GOAL:   Patient will meet greater than or equal to 90% of their needs -not meeting currently  MONITOR:   I & O's, Diet advancement, Labs, Weight trends, Supplement acceptance  REASON FOR ASSESSMENT:   Malnutrition Screening Tool    ASSESSMENT:   Lindsey Wolf is a 51 y.o. female with a 3 days hx of abdominal pain in the epigastric area and she thinks it was a gas type. Pain is moderate to severe in intensity and has worsening today.  POD #5 perforated ulcers x2 NGT to suction Leak being treated conservatively No new wts Labs and medications reviewed: K 3.4, Phos 1.9, Mg 1.6   Diet Order:  Diet NPO time specified Except for: Ice Chips  Skin:  Reviewed, no issues  Last BM:  PTA  Height:   Ht Readings from Last 1 Encounters:  07/20/16 5\' 10"  (1.778 m)    Weight:   Wt Readings from Last 1 Encounters:  07/20/16 129 lb 10.1 oz (58.8 kg)    Ideal Body Weight:  68.18 kg  BMI:  Body mass index is 18.6 kg/m.  Estimated Nutritional Needs:   Kcal:  1500-1800 calories  Protein:  70-82 gm  Fluid:  >/= 1.5L  EDUCATION NEEDS:   No education needs identified at this time  [name]. Samona Chihuahua, MS, RD LDN Inpatient Clinical Dietitian Pager [phone]

## 2016-07-25 NOTE — Progress Notes (Signed)
Per Dr. Dahlia Byes patient not to have oral contrast and IV only. CT to correct order.

## 2016-07-25 NOTE — Progress Notes (Signed)
POD # 5 perforated ulcers x 2 antral and pyloric w purulent peritonitis Clinically doing well, hungry and no abdominal pain WBC w persistent elevation AVSS Contrast study showed some leak , NGT kept JP 300cc NGT 350cc  PE NAD ABd: soft, NT, staples in place. Serous output from drains Ext: well perfused and warm  A/P Persistent WBC, CT a/p to r/o intra abdominal collection Continue to treat leak conservatively w IV a/s , ngt Start TPN No surgical intervention mobilize

## 2016-07-25 NOTE — Progress Notes (Signed)
Okay to use PICC line per vascular access RN

## 2016-07-25 NOTE — Consult Note (Signed)
Chief Complaint: Patient was seen in consultation today for  Chief Complaint  Patient presents with  . Abdominal Pain   at the request of * No referring provider recorded for this case *  Referring Physician(s): * P[place]on*  Supervising Physician: Marybelle Killings  Patient Status: [facility] - In-pt  History of Present Illness: Lindsey Wolf is a 51 y.o. female with a perihepatic abscess. Drain requested. Pt received Lovenox today, but her WBC is 26k.  History reviewed. No pertinent past medical history.  Past Surgical History:  Procedure Laterality Date  . LAPAROTOMY N/A 07/20/2016   Procedure: EXPLORATORY LAPAROTOMY;  Surgeon: Jules Husbands, MD;  Location: [facility] ORS;  Service: General;  Laterality: N/A;    Allergies: Patient has no known allergies.  Medications: Prior to Admission medications   Medication Sig Start Date End Date Taking? Authorizing Provider  ALPRAZolam (XANAX) 0.25 MG tablet Take 0.25 mg by mouth 3 (three) times daily as needed.  06/14/16  Yes Historical Provider, MD  FLUoxetine (PROZAC) 20 MG capsule Take 40 mg by mouth every morning. 06/15/16  Yes Historical Provider, MD     No family history on file.  Social History   Social History  . Marital status: Single    Spouse name: N/A  . Number of children: N/A  . Years of education: N/A   Social History Main Topics  . Smoking status: Current Every Day Smoker    Types: Cigarettes  . Smokeless tobacco: Never Used  . Alcohol use No  . Drug use: Unknown  . Sexual activity: Not Asked   Other Topics Concern  . None   Social History Narrative  . None     Review of Systems: A 12 point ROS discussed and pertinent positives are indicated in the HPI above.  All other systems are negative.  Review of Systems  Vital Signs: BP 117/70 (BP Location: Left Arm)   Pulse 99   Temp 98.2 F (36.8 C) (Oral)   Resp (!) 24   Ht 5\' 10"  (1.778 m)   Wt 129 lb 10.1 oz (58.8 kg)   SpO2 94%   BMI 18.60 kg/m    Physical Exam  Constitutional: She is oriented to person, place, and time. She appears well-developed and well-nourished.  Cardiovascular: Normal rate and regular rhythm.   Pulmonary/Chest: Effort normal and breath sounds normal.  Neurological: She is alert and oriented to person, place, and time.  Skin: Skin is warm and dry.    Mallampati Score: 1    Imaging: Dg Abd 1 View  Result Date: 07/21/2016 CLINICAL DATA:  NG tube placement EXAM: ABDOMEN - 1 VIEW COMPARISON:  CT abdomen and pelvis 07/20/2016.  Abdomen 07/20/2016. FINDINGS: An enteric tube has been placed with tip in the left upper quadrant consistent with location in the body of the stomach. Postoperative changes in the mid abdomen with skin clips along the midline and to intra-abdominal drains placed. Scattered gas demonstrated in the colon. IMPRESSION: NG tube tip in the left upper quadrant consistent with location in the body of the stomach. Surgical drains are present. Electronically Signed   By: Lucienne Capers M.D.   On: 07/21/2016 03:56   Ct Abdomen Pelvis W Contrast  Result Date: 07/25/2016 CLINICAL DATA:  Post operative abscess. EXAM: CT ABDOMEN AND PELVIS WITH CONTRAST TECHNIQUE: Multidetector CT imaging of the abdomen and pelvis was performed using the standard protocol following bolus administration of intravenous contrast. CONTRAST:  151mL ISOVUE-300 IOPAMIDOL (ISOVUE-300) INJECTION 61% COMPARISON:  07/10/2016 FINDINGS: Multifocal organized fluid collection/abscess. Subcapsular gas and fluid collection along the right and posterior aspect of the right liver measuring up to 4.4 cm in thickness and 18 cm in length. This likely communicates with fluid in the liver hilum tracking superiorly along the caudate and into the lesser sac, which is expanded by fluid, gas, and previously extravasated oral contrast. The lesser sac collection measures 9 cm in diameter. There is a discrete pocket of gas and fluid with internal contrast  material at the medial spleen measuring 6 cm in diameter. Contiguous collection of rim enhancing peritoneal fluid and gas along the ventral right lower quadrant measuring up to 46 mm in thickness, tracking into the interloop spaces. Ovoid cystic structure along the left pelvic side wall is likely peritoneal, less likely follicle. Surgical drains are present, but not in continuity with the largest collections Lower chest: Moderate layering pleural effusions with multi segment atelectasis. No suspicious pleural enhancement. Hepatobiliary:Negative biliary tree. Pancreas: Unremarkable. Spleen: Unremarkable. Adrenals/Urinary Tract: Negative adrenals. No hydronephrosis or stone. Unremarkable bladder. Stomach/Bowel: Sequela of gastric perforation described above. Postoperative stomach with 2 visible ulcers along the greater curvature at the antrum and bulb. No obstruction or ileus. Reactive submucosal edema in the proximal colon where contiguous with fluid collections. Gastric submucosal edema greatest at the fundus. Vascular/Lymphatic: No acute vascular abnormality. No mass or adenopathy. Reproductive:No pathologic findings. Other: Body wall edema. Recent laparotomy. No abdominal wall abscess. Musculoskeletal: No acute abnormalities. IMPRESSION: 1. Recently perforated gastric ulcers with extensive organized collection/abscess. 2. Large subcapsular hepatic abscess encompassing the right lobe. 3. 9 cm lesser sac collection which contains leaked contrast from UGI 2 days ago. 4. 6 cm left subdiaphragmatic abscess. 5. Insinuating abscess in the ventral right lower quadrant peritoneum. 6. Moderate pleural effusions and multi segment atelectasis. Electronically Signed   By: Monte Fantasia M.D.   On: 07/25/2016 10:50   Ct Abdomen Pelvis W Contrast  Result Date: 07/20/2016 CLINICAL DATA:  Abnormal radiographs demonstrating free air, 3 days generalized abdominal and pelvic pain, denies recent surgery and trauma EXAM: CT  ABDOMEN AND PELVIS WITH CONTRAST TECHNIQUE: Multidetector CT imaging of the abdomen and pelvis was performed using the standard protocol following bolus administration of intravenous contrast. Sagittal and coronal MPR images reconstructed from axial data set. CONTRAST:  24mL ISOVUE-300 IOPAMIDOL (ISOVUE-300) INJECTION 61% PO, 133mL ISOVUE-300 IOPAMIDOL (ISOVUE-300) INJECTION 61% IV COMPARISON:  Abdominal radiographs 07/20/2016 FINDINGS: Lower chest: Lung bases clear Hepatobiliary: Distended gallbladder 5.1 cm transverse. Cystic lesion LEFT lobe liver 18 x 14 mm. No definite biliary dilatation. Pancreas: Normal appearance Spleen: Normal appearance Adrenals/Urinary Tract: Adrenal glands, kidneys, ureters, and bladder normal appearance Stomach/Bowel: Diffuse thickening of small bowel loops. Colon decompressed with some areas demonstrating diffuse colonic wall thickening as well. These changes may reflect diffuse peritonitis rather than a primary bowel process. Significant thickening of the antral wall of the stomach extending to pylorus could be due to gastritis or ulcer disease though an infiltrative process is not excluded. No definite contrast extravasation identified but findings are most concerning for gastric origin of perforation. Duodenum shows no definite wall thickening. Vascular/Lymphatic: Atherosclerotic calcifications aorta and iliac arteries. No aortic aneurysm or adenopathy. 9 mm perigastric node image 32. Reproductive: Unremarkable uterus and adnexa Other: Significant free intraperitoneal air and fluid compatible with perforated viscus. Scattered interloop air and fluid. Scattered areas of peritoneal enhancement compatible with peritonitis. No definite discrete loculated abscess collection. Tiny supraumbilical ventral hernia containing a small amount of free fluid and free  air. Musculoskeletal: Unremarkable IMPRESSION: Extensive free intraperitoneal air and fluid compatible with perforated viscus.  Site/source of perforation is not localized but greatest degree of wall thickening is identified at the gastric antrum, potentially could represent perforated ulcer disease. Mild diffuse bowel wall thickening of large and small bowel loops throughout abdomen favoring reactive changes secondary to peritonitis. Findings called to Dr. Darl Householder on 07/20/2016 at 1440 hours. Electronically Signed   By: Lavonia Dana M.D.   On: 07/20/2016 14:43   Dg Abd Acute W/chest  Addendum Date: 07/20/2016   ADDENDUM REPORT: 07/20/2016 13:50 ADDENDUM: Critical Value/emergent results were called by telephone at the time of interpretation on 07/20/2016 at 1348 hours to Dr. Shirlyn Goltz , who verbally acknowledged these results. CT Abdomen and Pelvis is planned and pending. Electronically Signed   By: Genevie Ann M.D.   On: 07/20/2016 13:50   Result Date: 07/20/2016 CLINICAL DATA:  51 year old female with severe abdominal pain and vomiting for 2 days. Denies abdominal surgery. Initial encounter. EXAM: DG ABDOMEN ACUTE W/ 1V CHEST COMPARISON:  None. FINDINGS: Seated AP view of the chest is positive for pneumoperitoneum under the right hemidiaphragm which is also confirmed on the upright abdomen view. No pneumothorax. No pneumomediastinum is evident. Normal cardiac size and mediastinal contours. Visualized tracheal air column is within normal limits. The lungs appear clear. No pleural effusion. Unusual bowel-gas pattern including a central epigastric air-fluid level which seems unrelated to the stomach (arrow). On the upright abdomen view there is pneumoperitoneum under both hemidiaphragms. No other dilated loops. Relatively normal appearing distal small bowel and proximal colon gas. No acute osseous abnormality identified. IMPRESSION: 1. Positive for fairly large volume of free air in the abdomen indicating ruptured abdominal viscus. If the patient is stable enough CT Abdomen and Pelvis with oral and IV contrast is recommended. 2. Unusual  epigastric bowel-gas pattern suspicious for dilated and abnormal upper abdominal bowel loop. 3.  No superimposed acute cardiopulmonary abnormality. Electronically Signed: By: Genevie Ann M.D. On: 07/20/2016 13:45   Dg Duanne Limerick W/water Sol Cm  Result Date: 07/23/2016 CLINICAL DATA:  Perforated ulcers. EXAM: WATER SOLUBLE UPPER GI SERIES TECHNIQUE: Single-column upper GI series was performed using water soluble contrast. CONTRAST:  172mL ISOVUE-300 IOPAMIDOL (ISOVUE-300) INJECTION 61% COMPARISON:  KUB 07/21/2016.  CT 07/20/2016. FLUOROSCOPY TIME:  Fluoroscopy Time:  1 minutes 12 seconds. Radiation Exposure Index (if provided by the fluoroscopic device): 24.2 mGy Number of Acquired Spot Images: 10 FINDINGS: NG tube noted good position. Standard were soluble upper GI performed through the NG tube. Dilute Isovue-300 was utilized. Exam was limited as the patient would not stay on the table but for a limited time. Thickened antral and duodenal folds are noted. Contrast extravasation is noted arising from the region of the antrum and duodenum. Multiple sites of perforation cannot be excluded. Prominent gastroesophageal reflux. IMPRESSION: 1. Limited exam. Contrast extravasation is noted arising from the region of the antrum and duodenum. Multiple sites of perforation cannot be excluded . 2. NG tube noted in good anatomic position. Prominent gastroesophageal reflux. Electronically Signed   By: Marcello Moores  Register   On: 07/23/2016 08:50    Labs:  CBC:  Recent Labs  07/22/16 0411 07/23/16 0330 07/24/16 0454 07/25/16 0441  WBC 10.0 14.5* 26.9* 26.5*  HGB 8.2* 7.9* 9.2* 8.7*  HCT 25.8* 24.1* 28.7* 26.7*  PLT 270 292 334 355    COAGS:  Recent Labs  07/20/16 1507  INR 1.19    BMP:  Recent Labs  07/22/16  0411 07/22/16 1436 07/23/16 0330 07/24/16 0454 07/25/16 0441  NA 137  --  139 137 138  K 2.7* 3.1* 2.7* 3.2* 3.4*  CL 105  --  106 105 105  CO2 25  --  27 25 27   GLUCOSE 113*  --  104* 135* 104*    BUN 12  --  14 11 11   CALCIUM 7.7*  --  7.7* 7.9* 7.7*  CREATININE 0.54  --  0.44 0.43* 0.44  GFRNONAA >60  --  >60 >60 >60  GFRAA >60  --  >60 >60 >60    LIVER FUNCTION TESTS:  Recent Labs  07/20/16 0934  BILITOT 0.3  AST 29  ALT 13*  ALKPHOS 90  PROT 7.2  ALBUMIN 3.3*    TUMOR MARKERS: No results for input(s): AFPTM, CEA, CA199, CHROMGRNA in the last 8760 hours.  Assessment and Plan:  Abscess. Drain to follow.  Thank you for this interesting consult.  I greatly enjoyed meeting Lindsey Wolf [facility] and look forward to participating in their care.  A copy of this report was sent to the requesting provider on this date.  Electronically Signed: Dace Denn, ART A 07/25/2016, 2:27 PM   I spent a total of 40 Minutes    in face to face in clinical consultation, greater than 50% of which was counseling/coordinating care for abscess drain.

## 2016-07-25 NOTE — Progress Notes (Signed)
Ct reviewed there is actually 2 collections 1 adjacent to the liver and the other one in the lesser sac. The one in the lesser sac has some contrast. I discussed with Dr. Lenox Ahr from interventional radiology and he feels that it is difficult to drain the collection in the lesser sac. He does feel confident that we can drain the subcapsular collection in the right upper quadrant. We'll plan for CT-guided drainage of collection in the right upper quadrant. We'll continue medical management with TPN, NG tube and broad-spectrum antibiotics

## 2016-07-25 NOTE — Procedures (Signed)
R abdominal 12 Fr abscess drain Serous fluid No comp/EBL

## 2016-07-26 LAB — CBC
HEMATOCRIT: 22.9 % — AB (ref 35.0–47.0)
Hemoglobin: 7.4 g/dL — ABNORMAL LOW (ref 12.0–16.0)
MCH: 22 pg — AB (ref 26.0–34.0)
MCHC: 32.4 g/dL (ref 32.0–36.0)
MCV: 68 fL — AB (ref 80.0–100.0)
PLATELETS: 362 10*3/uL (ref 150–440)
RBC: 3.37 MIL/uL — ABNORMAL LOW (ref 3.80–5.20)
RDW: 21.4 % — AB (ref 11.5–14.5)
WBC: 29 10*3/uL — AB (ref 3.6–11.0)

## 2016-07-26 LAB — COMPREHENSIVE METABOLIC PANEL
ALBUMIN: 1.7 g/dL — AB (ref 3.5–5.0)
ALK PHOS: 113 U/L (ref 38–126)
ALT: 14 U/L (ref 14–54)
AST: 25 U/L (ref 15–41)
Anion gap: 4 — ABNORMAL LOW (ref 5–15)
BILIRUBIN TOTAL: 0.7 mg/dL (ref 0.3–1.2)
BUN: 8 mg/dL (ref 6–20)
CALCIUM: 7.3 mg/dL — AB (ref 8.9–10.3)
CO2: 26 mmol/L (ref 22–32)
Chloride: 106 mmol/L (ref 101–111)
Creatinine, Ser: 0.42 mg/dL — ABNORMAL LOW (ref 0.44–1.00)
GFR calc Af Amer: >60 mL/min (ref >60)
GLUCOSE: 98 mg/dL (ref 65–99)
POTASSIUM: 3.5 mmol/L (ref 3.5–5.1)
Sodium: 136 mmol/L (ref 135–145)
TOTAL PROTEIN: 4.5 g/dL — AB (ref 6.5–8.1)

## 2016-07-26 LAB — GLUCOSE, CAPILLARY
GLUCOSE-CAPILLARY: 108 mg/dL — AB (ref 65–99)
GLUCOSE-CAPILLARY: 101 mg/dL — AB (ref 65–99)
Glucose-Capillary: 104 mg/dL — ABNORMAL HIGH (ref 65–99)
Glucose-Capillary: 93 mg/dL (ref 65–99)
Glucose-Capillary: 96 mg/dL (ref 65–99)
Glucose-Capillary: 99 mg/dL (ref 65–99)

## 2016-07-26 LAB — MAGNESIUM: Magnesium: 1.7 mg/dL (ref 1.7–2.4)

## 2016-07-26 LAB — PHOSPHORUS: PHOSPHORUS: 3.8 mg/dL (ref 2.5–4.6)

## 2016-07-26 LAB — TRIGLYCERIDES: Triglycerides: 109 mg/dL (ref <150)

## 2016-07-26 MED ORDER — M.V.I. ADULT IV INJ
INJECTION | INTRAVENOUS | Status: AC
  Administered 2016-07-26: 18:00:00 via INTRAVENOUS
  Filled 2016-07-26: qty 1800

## 2016-07-26 MED ORDER — ENOXAPARIN SODIUM 40 MG/0.4ML ~~LOC~~ SOLN
40.0000 mg | SUBCUTANEOUS | Status: DC
  Administered 2016-07-26 – 2016-08-16 (×20): 40 mg via SUBCUTANEOUS
  Filled 2016-07-26 (×20): qty 0.4

## 2016-07-26 MED ORDER — FAT EMULSION 20 % IV EMUL
250.0000 mL | INTRAVENOUS | Status: AC
  Administered 2016-07-26: 250 mL via INTRAVENOUS
  Filled 2016-07-26: qty 250

## 2016-07-26 NOTE — Progress Notes (Signed)
Nutrition Follow-up  DOCUMENTATION CODES:   Not applicable  INTERVENTION:  1. Titrate 5%AA/15% Dextrose with electrolytes to goal rate of 71mL/hr 2. Begin 20% lipids @ 29mL/hr x 12 hrs 3. Received verbal order from Dr. Jamal Collin to decrease IVF to 88mL/hr - communicated to pharmacy  NUTRITION DIAGNOSIS:   Inadequate oral intake related to inability to eat as evidenced by NPO status. -ongoing  GOAL:   Patient will meet greater than or equal to 90% of their needs -progressing with TPN titrate  MONITOR:   I & O's, Diet advancement, Labs, Weight trends, Supplement acceptance  REASON FOR ASSESSMENT:   Malnutrition Screening Tool    ASSESSMENT:   Lindsey Wolf is a 51 y.o. female with a 3 days hx of abdominal pain in the epigastric area and she thinks it was a gas type. Pain is moderate to severe in intensity and has worsening today.  Continues with NGT - uncomfortable Lytes WNL Per surgery continues with leak Will continue to follow.  Diet Order:  Diet NPO time specified Except for: Ice Chips .TPN (CLINIMIX-E) Adult  Skin:  Reviewed, no issues  Last BM:  PTA  Height:   Ht Readings from Last 1 Encounters:  07/20/16 5\' 10"  (1.778 m)    Weight:   Wt Readings from Last 1 Encounters:  07/20/16 129 lb 10.1 oz (58.8 kg)    Ideal Body Weight:  68.18 kg  BMI:  Body mass index is 18.6 kg/m.  Estimated Nutritional Needs:   Kcal:  1500-1800 calories  Protein:  70-82 gm  Fluid:  >/= 1.5L  EDUCATION NEEDS:   No education needs identified at this time  Lindsey Wolf. Lindsey Azure, MS, RD LDN Inpatient Clinical Dietitian Pager [phone]

## 2016-07-26 NOTE — Progress Notes (Signed)
Patient ID: Lindsey Wolf, female   DOB: [birth year], 51 y.o.   MRN: [id number] Patient is complaining mostly about the NG tube. She states that she has passed some gas and has had a bowel movement. Yesterday she had percutaneous drainage of the collection in the subhepatic region which is now draining bile-stained clear fluid. NG tube likewise draining a moderate amount of bile-stained fluid. Vital signs are stable. She is afebrile Abdomen is soft and flat and does have good bowel sounds. Incision looks clean and intact. Drains with serosanguineous drainage. Percutaneous drain the right the subhepatic area is intact. Lungs are clear Labs-WBC remains elevated at 29,000. Impression: Patient with a perforated duodenal ulcer status post closure with the omental patch. Evidence of continued leak and now with the 2 collections one in the subhepatic space which is been drained and one in the lesser sac which has not been drained. Plan is to continue with TPN and NG tube and monitor her progress

## 2016-07-26 NOTE — Progress Notes (Signed)
PHARMACY - ADULT TOTAL PARENTERAL NUTRITION CONSULT NOTE   Pharmacy Consult for TPN Indication: Perforated ulcer  Patient Measurements: Height: 5\' 10"  (177.8 cm) Weight: 129 lb 10.1 oz (58.8 kg) IBW/kg (Calculated) : 68.5 TPN AdjBW (KG): 58.8 Body mass index is 18.6 kg/m. Usual Weight:   Assessment: Pharmacy consulted to assist in the management of electrolytes and glucose in this 51 year old female being starting on parenteral nutrition.   GI:  Endo:  Insulin requirements in the past 24 hours:  Lytes: All electrolytes wnl.  Renal: Pulm: Cards:  Hepatobil: Neuro: ID:  Best Practices: TPN Access: TPN start date:  Nutritional Goals (per RD recommendation on ): kCal: Protein:   Current Nutrition:   Plan:   Will increase Clinimix E5/15 to goal rate of 32ml/hr with Trace elements and MVI. Lipids 20% @ 20 ml/hr for 12 hours started as well.  10 MVI and trace elements in TPN SSI ordered q4h checks.  Fluids decreased to 50 ml/hr. Will recheck    Althia Egolf D 07/26/2016,11:56 AM

## 2016-07-27 ENCOUNTER — Encounter: Payer: Self-pay | Admitting: Radiology

## 2016-07-27 ENCOUNTER — Inpatient Hospital Stay: Payer: BLUE CROSS/BLUE SHIELD

## 2016-07-27 LAB — GLUCOSE, CAPILLARY
GLUCOSE-CAPILLARY: 125 mg/dL — AB (ref 65–99)
GLUCOSE-CAPILLARY: 81 mg/dL (ref 65–99)
Glucose-Capillary: 110 mg/dL — ABNORMAL HIGH (ref 65–99)
Glucose-Capillary: 116 mg/dL — ABNORMAL HIGH (ref 65–99)
Glucose-Capillary: 119 mg/dL — ABNORMAL HIGH (ref 65–99)
Glucose-Capillary: 120 mg/dL — ABNORMAL HIGH (ref 65–99)
Glucose-Capillary: 139 mg/dL — ABNORMAL HIGH (ref 65–99)

## 2016-07-27 LAB — CBC WITH DIFFERENTIAL/PLATELET
BASOS ABS: 0.1 10*3/uL (ref 0–0.1)
BASOS PCT: 0 %
EOS PCT: 0 %
Eosinophils Absolute: 0.1 10*3/uL (ref 0–0.7)
HEMATOCRIT: 23.3 % — AB (ref 35.0–47.0)
Hemoglobin: 7.7 g/dL — ABNORMAL LOW (ref 12.0–16.0)
LYMPHS PCT: 2 %
Lymphs Abs: 0.7 10*3/uL — ABNORMAL LOW (ref 1.0–3.6)
MCH: 22.4 pg — ABNORMAL LOW (ref 26.0–34.0)
MCHC: 32.9 g/dL (ref 32.0–36.0)
MCV: 68 fL — ABNORMAL LOW (ref 80.0–100.0)
Monocytes Absolute: 1.5 10*3/uL — ABNORMAL HIGH (ref 0.2–0.9)
Monocytes Relative: 5 %
NEUTROS ABS: 30.8 10*3/uL — AB (ref 1.4–6.5)
Neutrophils Relative %: 93 %
Platelets: 412 10*3/uL (ref 150–440)
RBC: 3.43 MIL/uL — AB (ref 3.80–5.20)
RDW: 21.3 % — ABNORMAL HIGH (ref 11.5–14.5)
WBC: 33.2 10*3/uL — AB (ref 3.6–11.0)

## 2016-07-27 LAB — COMPREHENSIVE METABOLIC PANEL
ALBUMIN: 1.8 g/dL — AB (ref 3.5–5.0)
ALT: 16 U/L (ref 14–54)
ANION GAP: 4 — AB (ref 5–15)
AST: 33 U/L (ref 15–41)
Alkaline Phosphatase: 138 U/L — ABNORMAL HIGH (ref 38–126)
BILIRUBIN TOTAL: 0.5 mg/dL (ref 0.3–1.2)
BUN: 7 mg/dL (ref 6–20)
CO2: 25 mmol/L (ref 22–32)
Calcium: 7.3 mg/dL — ABNORMAL LOW (ref 8.9–10.3)
Chloride: 105 mmol/L (ref 101–111)
Creatinine, Ser: 0.33 mg/dL — ABNORMAL LOW (ref 0.44–1.00)
GFR calc Af Amer: >60 mL/min (ref >60)
GFR calc non Af Amer: >60 mL/min (ref >60)
GLUCOSE: 120 mg/dL — AB (ref 65–99)
Potassium: 3.1 mmol/L — ABNORMAL LOW (ref 3.5–5.1)
SODIUM: 134 mmol/L — AB (ref 135–145)
TOTAL PROTEIN: 5 g/dL — AB (ref 6.5–8.1)

## 2016-07-27 LAB — MAGNESIUM: Magnesium: 1.7 mg/dL (ref 1.7–2.4)

## 2016-07-27 LAB — TRIGLYCERIDES: TRIGLYCERIDES: 125 mg/dL (ref <150)

## 2016-07-27 MED ORDER — IOPAMIDOL (ISOVUE-300) INJECTION 61%
15.0000 mL | INTRAVENOUS | Status: AC
  Administered 2016-07-27 (×2): 15 mL via ORAL

## 2016-07-27 MED ORDER — SODIUM CHLORIDE 0.9 % IV SOLN
30.0000 meq | Freq: Once | INTRAVENOUS | Status: AC
  Administered 2016-07-27: 30 meq via INTRAVENOUS
  Filled 2016-07-27: qty 15

## 2016-07-27 MED ORDER — FAT EMULSION 20 % IV EMUL
250.0000 mL | INTRAVENOUS | Status: AC
  Administered 2016-07-27: 250 mL via INTRAVENOUS
  Filled 2016-07-27 (×2): qty 250

## 2016-07-27 MED ORDER — SODIUM CHLORIDE 0.9 % IV SOLN
30.0000 meq | Freq: Once | INTRAVENOUS | Status: AC
  Administered 2016-07-27: 30 meq via INTRAVENOUS
  Filled 2016-07-27 (×2): qty 15

## 2016-07-27 MED ORDER — IOPAMIDOL (ISOVUE-300) INJECTION 61%
75.0000 mL | Freq: Once | INTRAVENOUS | Status: AC | PRN
  Administered 2016-07-27: 75 mL via INTRAVENOUS

## 2016-07-27 MED ORDER — TRACE MINERALS CR-CU-MN-SE-ZN 10-1000-500-60 MCG/ML IV SOLN
INTRAVENOUS | Status: AC
  Administered 2016-07-27: 18:00:00 via INTRAVENOUS
  Filled 2016-07-27: qty 1800

## 2016-07-27 MED ORDER — MAGNESIUM SULFATE 2 GM/50ML IV SOLN
2.0000 g | Freq: Once | INTRAVENOUS | Status: AC
  Administered 2016-07-27: 2 g via INTRAVENOUS
  Filled 2016-07-27: qty 50

## 2016-07-27 NOTE — Progress Notes (Signed)
Nutrition Follow-up  DOCUMENTATION CODES:   Not applicable  INTERVENTION:  -Continue 5%AA/15%Dextrose at rate of 75 ml/hr, 20% ILE at rate of 20 ml/hr for 12 hours.  -Sodium trending down, 134. Pt receiving 1/2 NS with 50 ml/hr. If pt continues ot require additional IV volume, may benefit from switching to NS -Pt with no weight since 2/16; daily weights while on TPN  NUTRITION DIAGNOSIS:   Inadequate oral intake related to inability to eat as evidenced by NPO status.  Continue TPN  GOAL:   Patient will meet greater than or equal to 90% of their needs  Met  MONITOR:   I & O's, Diet advancement, Labs, Weight trends, Supplement acceptance  REASON FOR ASSESSMENT:   Malnutrition Screening Tool    ASSESSMENT:   Lindsey Wolf is a 51 y.o. female with a 3 days hx of abdominal pain in the epigastric area and she thinks it was a gas type. Pain is moderate to severe in intensity and has worsening today.  TPN 5%/15%Dextrose at rate of 75 ml/hr, 20% ILE at 20 ml/hr for 12 hours NG with 400 mL, Drains 369 mL, UOP adequate (2250 mL of measure urine). No new weight Labs: sodium 134, potassium 3.1, CBGs 99-125 Meds: ss novolog, 1/2 NS with KCL 50 ml/hr  Diet Order:  Diet NPO time specified Except for: Ice Chips .TPN (CLINIMIX-E) Adult  Skin:  Reviewed, no issues  Last BM:  PTA  Height:   Ht Readings from Last 1 Encounters:  07/20/16 '5\' 10"'  (1.778 m)    Weight:   Wt Readings from Last 1 Encounters:  07/20/16 129 lb 10.1 oz (58.8 kg)   Filed Weights   07/20/16 0907 07/20/16 2125  Weight: 125 lb (56.7 kg) 129 lb 10.1 oz (58.8 kg)    Ideal Body Weight:  68.18 kg  BMI:  Body mass index is 18.6 kg/m.  Estimated Nutritional Needs:   Kcal:  1500-1800 calories  Protein:  70-82 gm  Fluid:  >/= 1.5L  EDUCATION NEEDS:   No education needs identified at this time  [place], [place], [place] [phone] Pager  [phone] Weekend/On-Call Pager

## 2016-07-27 NOTE — Progress Notes (Signed)
PHARMACY - ADULT TOTAL PARENTERAL NUTRITION CONSULT NOTE   Pharmacy Consult for TPN Indication: Perforated ulcer  Patient Measurements: Height: 5\' 10"  (177.8 cm) Weight: 129 lb 10.1 oz (58.8 kg) IBW/kg (Calculated) : 68.5 TPN AdjBW (KG): 58.8 Body mass index is 18.6 kg/m. Usual Weight:   Assessment: Pharmacy consulted to assist in the management of electrolytes and glucose in this 51 year old female being starting on parenteral nutrition.   GI:  Endo:  Insulin requirements in the past 24 hours:  Lytes: All electrolytes wnl except K= 3.1  Renal: Pulm: Cards:  Hepatobil: Neuro: ID:  Best Practices: TPN Access: TPN start date:  Nutritional Goals (per RD recommendation on ): kCal: Protein:   Current Nutrition:   Plan:   Will continue Clinimix E5/15 to goal rate of 55ml/hr with Trace elements and MVI. Lipids 20% @ 20 ml/hr for 12 hours started as well.  10 MVI and trace elements in TPN SSI ordered q4h checks.  Patient also receiving 0.45% NaCl with 40 mEq KCl @ 50 ml/hr. Spoke with MD Sankur about Na trending downward and MD will assess.  Will give KCl 30 mEq IV x 1 doses and will recheck electrolytes with am labs.      Arlinda Barcelona D 07/27/2016,12:25 PM

## 2016-07-27 NOTE — Progress Notes (Signed)
Patient ID: Lindsey Wolf, female   DOB: Oct 06, 1965, 51 y.o.   MRN: [id number] Pt with no new complaints. VSS, afebrile. WBC up to 33K today. CT repeated and discussed with radiologist , The large fluid pocket on right side is stable to smaller size. All other fluid collections are stable. No apparent contrast extravasation from stomach Abdomen is soft and flat, bowels sounds active.All drains are intact and draining well. Will continue with NPO and antibiotic. Repeat scan early next week.

## 2016-07-28 LAB — GLUCOSE, CAPILLARY
GLUCOSE-CAPILLARY: 114 mg/dL — AB (ref 65–99)
GLUCOSE-CAPILLARY: 104 mg/dL — AB (ref 65–99)
GLUCOSE-CAPILLARY: 109 mg/dL — AB (ref 65–99)
Glucose-Capillary: 124 mg/dL — ABNORMAL HIGH (ref 65–99)
Glucose-Capillary: 119 mg/dL — ABNORMAL HIGH (ref 65–99)

## 2016-07-28 LAB — BASIC METABOLIC PANEL
Anion gap: 5 (ref 5–15)
BUN: 9 mg/dL (ref 6–20)
CHLORIDE: 104 mmol/L (ref 101–111)
CO2: 24 mmol/L (ref 22–32)
CREATININE: 0.38 mg/dL — AB (ref 0.44–1.00)
Calcium: 7.2 mg/dL — ABNORMAL LOW (ref 8.9–10.3)
GFR calc non Af Amer: >60 mL/min (ref >60)
Glucose, Bld: 100 mg/dL — ABNORMAL HIGH (ref 65–99)
POTASSIUM: 3.7 mmol/L (ref 3.5–5.1)
SODIUM: 133 mmol/L — AB (ref 135–145)

## 2016-07-28 LAB — CBC WITH DIFFERENTIAL/PLATELET
BASOS ABS: 0 10*3/uL (ref 0–0.1)
Basophils Relative: 0 %
Eosinophils Absolute: 0 10*3/uL (ref 0–0.7)
Eosinophils Relative: 0 %
HEMATOCRIT: 22.3 % — AB (ref 35.0–47.0)
Hemoglobin: 6.9 g/dL — ABNORMAL LOW (ref 12.0–16.0)
LYMPHS PCT: 2 %
Lymphs Abs: 0.7 10*3/uL — ABNORMAL LOW (ref 1.0–3.6)
MCH: 21.4 pg — AB (ref 26.0–34.0)
MCHC: 30.9 g/dL — ABNORMAL LOW (ref 32.0–36.0)
MCV: 69.2 fL — AB (ref 80.0–100.0)
MONOS PCT: 6 %
Monocytes Absolute: 2 10*3/uL — ABNORMAL HIGH (ref 0.2–0.9)
Neutro Abs: 31 10*3/uL — ABNORMAL HIGH (ref 1.4–6.5)
Neutrophils Relative %: 92 %
Platelets: 460 10*3/uL — ABNORMAL HIGH (ref 150–440)
RBC: 3.22 MIL/uL — AB (ref 3.80–5.20)
RDW: 21.2 % — ABNORMAL HIGH (ref 11.5–14.5)
WBC: 33.7 10*3/uL — AB (ref 3.6–11.0)

## 2016-07-28 LAB — MAGNESIUM: MAGNESIUM: 2 mg/dL (ref 1.7–2.4)

## 2016-07-28 MED ORDER — TRACE MINERALS CR-CU-MN-SE-ZN 10-1000-500-60 MCG/ML IV SOLN
INTRAVENOUS | Status: AC
  Administered 2016-07-28: 18:00:00 via INTRAVENOUS
  Filled 2016-07-28: qty 1800

## 2016-07-28 MED ORDER — FAT EMULSION 20 % IV EMUL
250.0000 mL | INTRAVENOUS | Status: AC
  Administered 2016-07-28: 250 mL via INTRAVENOUS
  Filled 2016-07-28 (×2): qty 250

## 2016-07-28 NOTE — Progress Notes (Signed)
PHARMACY - ADULT TOTAL PARENTERAL NUTRITION CONSULT NOTE   Pharmacy Consult for TPN Indication: Perforated ulcer  Patient Measurements: Height: 5\' 10"  (177.8 cm) Weight: 155 lb 14.4 oz (70.7 kg) IBW/kg (Calculated) : 68.5 TPN AdjBW (KG): 58.8 Body mass index is 22.37 kg/m. Usual Weight:   Assessment: Pharmacy consulted to assist in the management of electrolytes and glucose in this 51 year old female being starting on parenteral nutrition.   GI:  Endo:  Insulin requirements in the past 24 hours:  Lytes: All electrolytes wnl except K= 3.1  Renal: Pulm: Cards:  Hepatobil: Neuro: ID:  Best Practices: TPN Access: TPN start date:  Nutritional Goals (per RD recommendation on ): kCal: Protein:   Current Nutrition:   Plan:   Will continue Clinimix E5/15 to goal rate of 13ml/hr with Trace elements and MVI. Lipids 20% @ 20 ml/hr for 12 hours started as well.  10 MVI and trace elements in TPN SSI ordered q4h checks.  Patient also receiving 0.45% NaCl with 40 mEq KCl @ 50 ml/hr. Spoke with MD Sankur about Na trending downward and MD will assess.  Will recheck electrolytes with am labs.   [place] Clinical Pharmacist 07/28/2016,8:20 AM

## 2016-07-28 NOTE — Progress Notes (Signed)
Patient ID: Lindsey Wolf, female   DOB: 01/08/1966, 51 y.o.   MRN: [id number] No apparent change in her condition. She says she is had some small bowel movements. Pain is under very decent control. Afebrile with stable vital signs. Abdominal exam reveals all the catheters are intact. There is somewhat of a seropurulent drainage in the left [place] drain. The [place] drain on the right appears to have more serous sanguinous fluid. The percutaneous catheter is draining the most amount and appears to contain clear fluid with subtle greenish tinge. Abdomen is otherwise soft and does have good bowel sounds. Midline incision had a there was a moderate amount of seropurulent drainage on the dressing. This appeared to be coming from a spot just above the umbilicus. The staples in this area were removed and a skin opening of about  2 centimeters was made. This was probed with a Q-tip and appeared to be a subcutaneous pocket extending superiorly about 2 inches. Does not appear to the fascia is compromised. WBC remains at 33K.Marland Kitchen Hemoglobin did drift down to 6.9. We'll check this again tomorrow and if it still lower will consider transfusion. Chemistries are relatively stable

## 2016-07-29 ENCOUNTER — Inpatient Hospital Stay: Payer: BLUE CROSS/BLUE SHIELD

## 2016-07-29 LAB — BLOOD GAS, ARTERIAL
Acid-Base Excess: 0.4 mmol/L (ref 0.0–2.0)
BICARBONATE: 24 mmol/L (ref 20.0–28.0)
FIO2: 0.28
O2 SAT: 89.3 %
PCO2 ART: 33 mmHg (ref 32.0–48.0)
PO2 ART: 53 mmHg — AB (ref 83.0–108.0)
Patient temperature: 37
pH, Arterial: 7.47 — ABNORMAL HIGH (ref 7.350–7.450)

## 2016-07-29 LAB — BASIC METABOLIC PANEL
ANION GAP: 6 (ref 5–15)
BUN: 9 mg/dL (ref 6–20)
CHLORIDE: 103 mmol/L (ref 101–111)
CO2: 23 mmol/L (ref 22–32)
Calcium: 7.3 mg/dL — ABNORMAL LOW (ref 8.9–10.3)
Creatinine, Ser: 0.34 mg/dL — ABNORMAL LOW (ref 0.44–1.00)
GFR calc Af Amer: >60 mL/min (ref >60)
GFR calc non Af Amer: >60 mL/min (ref >60)
Glucose, Bld: 123 mg/dL — ABNORMAL HIGH (ref 65–99)
POTASSIUM: 3.9 mmol/L (ref 3.5–5.1)
SODIUM: 132 mmol/L — AB (ref 135–145)

## 2016-07-29 LAB — GLUCOSE, CAPILLARY
GLUCOSE-CAPILLARY: 124 mg/dL — AB (ref 65–99)
GLUCOSE-CAPILLARY: 135 mg/dL — AB (ref 65–99)
GLUCOSE-CAPILLARY: 141 mg/dL — AB (ref 65–99)
GLUCOSE-CAPILLARY: 169 mg/dL — AB (ref 65–99)
Glucose-Capillary: 113 mg/dL — ABNORMAL HIGH (ref 65–99)
Glucose-Capillary: 168 mg/dL — ABNORMAL HIGH (ref 65–99)

## 2016-07-29 LAB — CBC
HCT: 22.4 % — ABNORMAL LOW (ref 35.0–47.0)
HEMOGLOBIN: 7.1 g/dL — AB (ref 12.0–16.0)
MCH: 22.4 pg — ABNORMAL LOW (ref 26.0–34.0)
MCHC: 31.7 g/dL — AB (ref 32.0–36.0)
MCV: 70.6 fL — AB (ref 80.0–100.0)
PLATELETS: 580 10*3/uL — AB (ref 150–440)
RBC: 3.17 MIL/uL — ABNORMAL LOW (ref 3.80–5.20)
RDW: 22.4 % — ABNORMAL HIGH (ref 11.5–14.5)
WBC: 36 10*3/uL — ABNORMAL HIGH (ref 3.6–11.0)

## 2016-07-29 LAB — PHOSPHORUS: PHOSPHORUS: 3.5 mg/dL (ref 2.5–4.6)

## 2016-07-29 LAB — MAGNESIUM: MAGNESIUM: 1.9 mg/dL (ref 1.7–2.4)

## 2016-07-29 LAB — ALBUMIN: ALBUMIN: 1.7 g/dL — AB (ref 3.5–5.0)

## 2016-07-29 LAB — PREPARE RBC (CROSSMATCH)

## 2016-07-29 MED ORDER — IPRATROPIUM-ALBUTEROL 0.5-2.5 (3) MG/3ML IN SOLN
RESPIRATORY_TRACT | Status: AC
  Filled 2016-07-29: qty 3

## 2016-07-29 MED ORDER — IPRATROPIUM-ALBUTEROL 0.5-2.5 (3) MG/3ML IN SOLN
3.0000 mL | Freq: Four times a day (QID) | RESPIRATORY_TRACT | Status: DC | PRN
  Administered 2016-08-09: 3 mL via RESPIRATORY_TRACT
  Filled 2016-07-29: qty 3

## 2016-07-29 MED ORDER — TRACE MINERALS CR-CU-MN-SE-ZN 10-1000-500-60 MCG/ML IV SOLN
INTRAVENOUS | Status: AC
  Administered 2016-07-29: 17:00:00 via INTRAVENOUS
  Filled 2016-07-29: qty 1800

## 2016-07-29 MED ORDER — FAT EMULSION 20 % IV EMUL
250.0000 mL | INTRAVENOUS | Status: AC
  Administered 2016-07-29 – 2016-07-30 (×2): 250 mL via INTRAVENOUS
  Filled 2016-07-29 (×4): qty 250

## 2016-07-29 MED ORDER — IPRATROPIUM-ALBUTEROL 0.5-2.5 (3) MG/3ML IN SOLN
3.0000 mL | Freq: Four times a day (QID) | RESPIRATORY_TRACT | Status: DC
  Administered 2016-07-29: 3 mL via RESPIRATORY_TRACT

## 2016-07-29 MED ORDER — SODIUM CHLORIDE 0.9 % IV SOLN
Freq: Once | INTRAVENOUS | Status: AC
  Administered 2016-07-29: 15:00:00 via INTRAVENOUS

## 2016-07-29 NOTE — Progress Notes (Signed)
Called for patient with tachycardia and tachypnea. She is status post closure of perforated ulcers with subsequent drainage of perihepatic abscess.  In reviewing the patient's chart she has been tachycardic for 24 hours but tachypneic only in the last few hours. She remains afebrile.  Subjectively the patient describes shortness of breath but is mostly complaining about the nasogastric tube she has no chest pain. No worsening of her abdominal pain.  Vital signs as above. Abdomen is soft nondistended nontympanitic and essentially nontender. The midline wound is draining seropurulent fluid as described in Dr. Angie Fava note yesterday. Normal erythema.  Drains are in place and draining purulent material  Chest shows decreased breath sounds bilaterally but clear breath sounds superiorly.  White blood cell count has been climbing. CT scan from the 23rd 2 days ago is personally reviewed. Multiple large fluid collections and a subcutaneous fluid collection noted and draining. At least to the drains are ineffectual although they are draining some purulent material there is still fluid collections adjacent to those drains that are undrained.  I will discuss this case with Dr. Jamal Collin. This patient will likely require an operation for drainage. An alternative would be to perform bilateral thoracentesis to improve her pulmonary function but I think these undrained fluid collections are going to lead to sepsis at some point and this may be the early signs of it with an elevated white blood cell count elevated heart rate and respiratory rate.  Blood gas was reviewed showing a respiratory alkalosis with relative hypoxemia and oxygen has been ordered for the patient. Chest x-ray was personally reviewed which does not look as bad as the CT scan looks with respect to the bilateral pleural effusions.

## 2016-07-29 NOTE — Progress Notes (Signed)
Patient ID: COSETTE SURACE, female   DOB: Dec 02, 1965, 51 y.o.   MRN: [id number] Patient with poorly got little to Make this morning. Dr. Burt Knack had evaluated her. She was started on oxygen since his blood gas revealed mild hypoxia and mild alkalosis. Patient does not appear to be any different today except for some mild tachypnea. Patient however stated that she's been like this for several days and it comes and goes. Heart rate in the 100s to 120 respiratory rate at about 24. Her sats are better on oxygen now. Blood pressure stable. No fever Abdominal exam is essentially unchanged that is some minimal drainage in the area that was opened just above the umbilicus but it does appear to be less murky then was yesterday. Abdomen is otherwise soft with hypoactive bowel sounds. The drains and the percutaneous catheter were all intact and functioning. Labs still showing a white count of 36,000 and hemoglobin at 7. She had a hemoglobin of little over 10 when she came in. After review of her prior CTs from it appears that she has on drained collections of fluid in the abdomen particularly the lesser sac and large area in the subretinal diaphragmatic space and also some elsewhere in the abdomen. The CT from 2 days ago did show that there was maybe a subtle improvement and in some of these collections particularly the one that's in the hepatic area. Given the high white count and the fact that the patient has not clinically showing any significant improvement she likely needs a reexploration with drainage of all pockets of fluid. I discussed this briefly with the patient but she is not sure if she wants to go through one operation today feeling somewhat weak. We'll plan for blood transfusion to help with her on oxygenation and her strength. We'll tentatively schedule for her to have a reexploration tomorrow unless she makes some significant improvement overnight.

## 2016-07-29 NOTE — Progress Notes (Signed)
PHARMACY - ADULT TOTAL PARENTERAL NUTRITION CONSULT NOTE   Pharmacy Consult for TPN Indication: Perforated ulcer  Patient Measurements: Height: 5\' 10"  (177.8 cm) Weight: 149 lb 6.4 oz (67.8 kg) IBW/kg (Calculated) : 68.5 TPN AdjBW (KG): 58.8 Body mass index is 21.44 kg/m. Usual Weight:   Assessment: Pharmacy consulted to assist in the management of electrolytes and glucose in this 51 year old female being starting on parenteral nutrition.   GI:  Endo:  Insulin requirements in the past 24 hours:  Lytes: All electrolytes wnl  Renal: Pulm: Cards:  Hepatobil: Neuro: ID:  Best Practices: TPN Access: TPN start date:  Nutritional Goals (per RD recommendation on ): kCal: Protein:   Current Nutrition:   Plan:  Will continue Clinimix E5/15 to goal rate of 59ml/hr with Trace elements and MVI. Lipids 20% @ 20 ml/hr for 12 hours started as well.  10 MVI and trace elements in TPN SSI ordered q4h checks.  Patient also receiving 0.45% NaCl with 40 mEq KCl @ 50 ml/hr. Spoke with MD Sankur about Na trending downward and MD will assess.  Will recheck electrolytes with am labs.   [place] Clinical Pharmacist 07/29/2016,9:54 AM

## 2016-07-29 NOTE — Progress Notes (Signed)
Pt awoke this AM SOB, VS showed HR 119, R 24, Temp and BP were stable. Pt agitated wanting to get out of bed and to take NG tube out. Pt also complained of abd pain and feeling like she wants to "pass out". MD notified and stat chest Xray and ABG's ordered. MD did come to floor assessed PT and spoke to her about test results. Respirations 32 at this time. Pt also complained of chest pain at this time. MD ordered Pt to be placed on O2 4L. Pain med also admin for L upper abd pain at 7/10.

## 2016-07-30 ENCOUNTER — Inpatient Hospital Stay: Payer: BLUE CROSS/BLUE SHIELD | Admitting: Anesthesiology

## 2016-07-30 ENCOUNTER — Inpatient Hospital Stay: Payer: BLUE CROSS/BLUE SHIELD

## 2016-07-30 ENCOUNTER — Encounter
Admission: EM | Disposition: A | Discharge status: Home Health | Payer: Self-pay | Source: Home / Self Care | Attending: Surgery

## 2016-07-30 DIAGNOSIS — R069 Unspecified abnormalities of breathing: Secondary | ICD-10-CM

## 2016-07-30 DIAGNOSIS — K651 Peritoneal abscess: Secondary | ICD-10-CM

## 2016-07-30 DIAGNOSIS — R0603 Acute respiratory distress: Secondary | ICD-10-CM

## 2016-07-30 DIAGNOSIS — A419 Sepsis, unspecified organism: Principal | ICD-10-CM

## 2016-07-30 DIAGNOSIS — J989 Respiratory disorder, unspecified: Secondary | ICD-10-CM

## 2016-07-30 DIAGNOSIS — J9601 Acute respiratory failure with hypoxia: Secondary | ICD-10-CM

## 2016-07-30 DIAGNOSIS — R0600 Dyspnea, unspecified: Secondary | ICD-10-CM

## 2016-07-30 DIAGNOSIS — R6521 Severe sepsis with septic shock: Secondary | ICD-10-CM

## 2016-07-30 DIAGNOSIS — J90 Pleural effusion, not elsewhere classified: Secondary | ICD-10-CM

## 2016-07-30 HISTORY — PX: LYSIS OF ADHESION: SHX5961

## 2016-07-30 HISTORY — PX: LAPAROTOMY: SHX154

## 2016-07-30 LAB — BLOOD GAS, ARTERIAL
ACID-BASE DEFICIT: 3.3 mmol/L — AB (ref 0.0–2.0)
Acid-base deficit: 4.3 mmol/L — ABNORMAL HIGH (ref 0.0–2.0)
BICARBONATE: 22.1 mmol/L (ref 20.0–28.0)
Bicarbonate: 22.6 mmol/L (ref 20.0–28.0)
FIO2: 1
FIO2: 1
MECHANICAL RATE: 18
MECHANICAL RATE: 23
MECHVT: 450 mL
O2 Saturation: 98 %
O2 Saturation: 99.1 %
PEEP/CPAP: 5 cmH2O
PEEP: 5 cmH2O
PH ART: 7.35 (ref 7.350–7.450)
Patient temperature: 37
Patient temperature: 37
VT: 450 mL
pCO2 arterial: 48 mmHg (ref 32.0–48.0)
pCO2 arterial: 40 mmHg (ref 32.0–48.0)
pH, Arterial: 7.28 — ABNORMAL LOW (ref 7.350–7.450)
pO2, Arterial: 117 mmHg — ABNORMAL HIGH (ref 83.0–108.0)
pO2, Arterial: 142 mmHg — ABNORMAL HIGH (ref 83.0–108.0)

## 2016-07-30 LAB — TYPE AND SCREEN
ABO/RH(D): A POS
Antibody Screen: NEGATIVE
UNIT DIVISION: 0
UNIT DIVISION: 0

## 2016-07-30 LAB — CBC WITH DIFFERENTIAL/PLATELET
BASOS PCT: 0 %
Basophils Absolute: 0.1 10*3/uL (ref 0–0.1)
EOS ABS: 0 10*3/uL (ref 0–0.7)
Eosinophils Relative: 0 %
HCT: 33.3 % — ABNORMAL LOW (ref 35.0–47.0)
Hemoglobin: 9.9 g/dL — ABNORMAL LOW (ref 12.0–16.0)
Lymphocytes Relative: 1 %
Lymphs Abs: 0.5 10*3/uL — ABNORMAL LOW (ref 1.0–3.6)
MCH: 24.6 pg — ABNORMAL LOW (ref 26.0–34.0)
MCHC: 29.8 g/dL — AB (ref 32.0–36.0)
MCV: 82.4 fL (ref 80.0–100.0)
MONO ABS: 1.3 10*3/uL — AB (ref 0.2–0.9)
MONOS PCT: 3 %
Neutro Abs: 37.3 10*3/uL — ABNORMAL HIGH (ref 1.4–6.5)
Neutrophils Relative %: 96 %
Platelets: 344 10*3/uL (ref 150–440)
RBC: 4.04 MIL/uL (ref 3.80–5.20)
RDW: 24.6 % — AB (ref 11.5–14.5)
WBC: 39.2 10*3/uL — ABNORMAL HIGH (ref 3.6–11.0)

## 2016-07-30 LAB — BASIC METABOLIC PANEL
ANION GAP: 4 — AB (ref 5–15)
Anion gap: 4 — ABNORMAL LOW (ref 5–15)
BUN: 16 mg/dL (ref 6–20)
BUN: 19 mg/dL (ref 6–20)
CALCIUM: 6.8 mg/dL — AB (ref 8.9–10.3)
CO2: 25 mmol/L (ref 22–32)
CO2: 24 mmol/L (ref 22–32)
CREATININE: 0.41 mg/dL — AB (ref 0.44–1.00)
Calcium: 7.3 mg/dL — ABNORMAL LOW (ref 8.9–10.3)
Chloride: 102 mmol/L (ref 101–111)
Chloride: 101 mmol/L (ref 101–111)
Creatinine, Ser: 0.4 mg/dL — ABNORMAL LOW (ref 0.44–1.00)
Glucose, Bld: 186 mg/dL — ABNORMAL HIGH (ref 65–99)
Glucose, Bld: 190 mg/dL — ABNORMAL HIGH (ref 65–99)
POTASSIUM: 4 mmol/L (ref 3.5–5.1)
POTASSIUM: 4.3 mmol/L (ref 3.5–5.1)
SODIUM: 131 mmol/L — AB (ref 135–145)
SODIUM: 129 mmol/L — AB (ref 135–145)

## 2016-07-30 LAB — ALBUMIN: ALBUMIN: 1.6 g/dL — AB (ref 3.5–5.0)

## 2016-07-30 LAB — PROTIME-INR
INR: 1.37
PROTHROMBIN TIME: 17 s — AB (ref 11.4–15.2)

## 2016-07-30 LAB — BODY FLUID CELL COUNT WITH DIFFERENTIAL
Eos, Fluid: 0 %
Lymphs, Fluid: 5 %
Monocyte-Macrophage-Serous Fluid: 3 %
Neutrophil Count, Fluid: 92 %
Total Nucleated Cell Count, Fluid: 5989 cu mm

## 2016-07-30 LAB — GLUCOSE, CAPILLARY
GLUCOSE-CAPILLARY: 154 mg/dL — AB (ref 65–99)
GLUCOSE-CAPILLARY: 165 mg/dL — AB (ref 65–99)
GLUCOSE-CAPILLARY: 186 mg/dL — AB (ref 65–99)
Glucose-Capillary: 140 mg/dL — ABNORMAL HIGH (ref 65–99)
Glucose-Capillary: 147 mg/dL — ABNORMAL HIGH (ref 65–99)
Glucose-Capillary: 166 mg/dL — ABNORMAL HIGH (ref 65–99)

## 2016-07-30 LAB — LACTIC ACID, PLASMA
Lactic Acid, Venous: 1.4 mmol/L (ref 0.5–1.9)
Lactic Acid, Venous: 1.3 mmol/L (ref 0.5–1.9)

## 2016-07-30 LAB — PROCALCITONIN: PROCALCITONIN: 3.14 ng/mL

## 2016-07-30 LAB — MAGNESIUM: MAGNESIUM: 1.9 mg/dL (ref 1.7–2.4)

## 2016-07-30 LAB — PHOSPHORUS: Phosphorus: 4.7 mg/dL — ABNORMAL HIGH (ref 2.5–4.6)

## 2016-07-30 SURGERY — LAPAROTOMY, EXPLORATORY
Anesthesia: General | Wound class: Contaminated

## 2016-07-30 MED ORDER — MIDAZOLAM HCL 2 MG/2ML IJ SOLN: 2.0000 mg | INTRAMUSCULAR | Status: DC | PRN

## 2016-07-30 MED ORDER — MIDAZOLAM HCL 2 MG/2ML IJ SOLN
INTRAMUSCULAR | Status: AC
  Filled 2016-07-30: qty 2

## 2016-07-30 MED ORDER — FENTANYL BOLUS VIA INFUSION
50.0000 ug | INTRAVENOUS | Status: DC | PRN
  Administered 2016-07-30: 50 ug via INTRAVENOUS
  Filled 2016-07-30: qty 50

## 2016-07-30 MED ORDER — ROCURONIUM BROMIDE 100 MG/10ML IV SOLN
INTRAVENOUS | Status: DC | PRN
  Administered 2016-07-30: 20 mg via INTRAVENOUS
  Administered 2016-07-30: 50 mg via INTRAVENOUS
  Administered 2016-07-30: 30 mg via INTRAVENOUS
  Administered 2016-07-30: 50 mg via INTRAVENOUS

## 2016-07-30 MED ORDER — METOPROLOL TARTRATE 5 MG/5ML IV SOLN
10.0000 mg | Freq: Once | INTRAVENOUS | Status: AC
  Administered 2016-07-30: 10 mg via INTRAVENOUS

## 2016-07-30 MED ORDER — CHLORHEXIDINE GLUCONATE 0.12% ORAL RINSE (MEDLINE KIT)
15.0000 mL | Freq: Two times a day (BID) | OROMUCOSAL | Status: DC
  Administered 2016-07-30 – 2016-08-05 (×14): 15 mL via OROMUCOSAL

## 2016-07-30 MED ORDER — FAT EMULSION 20 % IV EMUL
250.0000 mL | INTRAVENOUS | Status: AC
  Administered 2016-07-30: 250 mL via INTRAVENOUS
  Filled 2016-07-30: qty 250

## 2016-07-30 MED ORDER — VECURONIUM BROMIDE 10 MG IV SOLR
10.0000 mg | Freq: Once | INTRAVENOUS | Status: AC
  Administered 2016-07-30: 10 mg via INTRAVENOUS

## 2016-07-30 MED ORDER — MIDAZOLAM HCL 2 MG/2ML IJ SOLN
4.0000 mg | Freq: Once | INTRAMUSCULAR | Status: AC
  Administered 2016-07-30: 4 mg via INTRAVENOUS

## 2016-07-30 MED ORDER — LACTATED RINGERS IV BOLUS (SEPSIS)
1000.0000 mL | Freq: Once | INTRAVENOUS | Status: AC
  Administered 2016-07-30: 1000 mL via INTRAVENOUS

## 2016-07-30 MED ORDER — SODIUM CHLORIDE 0.9% FLUSH
10.0000 mL | Freq: Two times a day (BID) | INTRAVENOUS | Status: DC
  Administered 2016-07-30 – 2016-08-02 (×5): 10 mL
  Administered 2016-08-03: 20 mL
  Administered 2016-08-03 – 2016-08-04 (×2): 10 mL
  Administered 2016-08-04: 20 mL
  Administered 2016-08-05 – 2016-08-06 (×3): 10 mL
  Administered 2016-08-06: 40 mL
  Administered 2016-08-07 – 2016-08-13 (×14): 10 mL
  Administered 2016-08-14: 20 mL
  Administered 2016-08-14 – 2016-08-15 (×2): 10 mL
  Administered 2016-08-16: 30 mL
  Administered 2016-08-17: 10 mL
  Administered 2016-08-17 – 2016-08-18 (×2): 30 mL
  Administered 2016-08-18 – 2016-08-19 (×3): 10 mL
  Administered 2016-08-20: 30 mL
  Administered 2016-08-20 – 2016-08-21 (×2): 10 mL
  Administered 2016-08-21: 20 mL
  Administered 2016-08-22 – 2016-08-23 (×2): 10 mL

## 2016-07-30 MED ORDER — VECURONIUM BROMIDE 10 MG IV SOLR
INTRAVENOUS | Status: AC
  Administered 2016-07-30: 10 mg via INTRAVENOUS
  Filled 2016-07-30: qty 10

## 2016-07-30 MED ORDER — FUROSEMIDE 10 MG/ML IJ SOLN
40.0000 mg | Freq: Once | INTRAMUSCULAR | Status: AC
  Administered 2016-07-30: 40 mg via INTRAVENOUS
  Filled 2016-07-30: qty 4

## 2016-07-30 MED ORDER — FENTANYL CITRATE (PF) 100 MCG/2ML IJ SOLN
INTRAMUSCULAR | Status: AC
  Administered 2016-07-30: 200 ug via INTRAVENOUS
  Filled 2016-07-30: qty 4

## 2016-07-30 MED ORDER — STERILE WATER FOR INJECTION IJ SOLN
INTRAMUSCULAR | Status: AC
  Administered 2016-07-30: 10 mL
  Filled 2016-07-30: qty 10

## 2016-07-30 MED ORDER — FENTANYL CITRATE (PF) 100 MCG/2ML IJ SOLN
200.0000 ug | Freq: Once | INTRAMUSCULAR | Status: AC
  Administered 2016-07-30: 200 ug via INTRAVENOUS

## 2016-07-30 MED ORDER — STERILE WATER FOR INJECTION IJ SOLN
INTRAMUSCULAR | Status: AC
Start: 2016-07-30 — End: 2016-07-30
  Administered 2016-07-30: 10 mL
  Filled 2016-07-30: qty 10

## 2016-07-30 MED ORDER — TRACE MINERALS CR-CU-MN-SE-ZN 10-1000-500-60 MCG/ML IV SOLN
INTRAVENOUS | Status: AC
  Administered 2016-07-30: 18:00:00 via INTRAVENOUS
  Filled 2016-07-30: qty 1800

## 2016-07-30 MED ORDER — SODIUM CHLORIDE 0.9 % IV SOLN
INTRAVENOUS | Status: DC | PRN
  Administered 2016-07-30: 15:00:00 via INTRAVENOUS

## 2016-07-30 MED ORDER — MIDAZOLAM HCL 2 MG/2ML IJ SOLN
INTRAMUSCULAR | Status: AC
  Administered 2016-07-30: 4 mg via INTRAVENOUS
  Filled 2016-07-30: qty 4

## 2016-07-30 MED ORDER — LACTATED RINGERS IV SOLN
INTRAVENOUS | Status: DC | PRN
  Administered 2016-07-30: 16:00:00 via INTRAVENOUS

## 2016-07-30 MED ORDER — PROPOFOL 10 MG/ML IV BOLUS
INTRAVENOUS | Status: AC
  Filled 2016-07-30: qty 20

## 2016-07-30 MED ORDER — METHYLENE BLUE 0.5 % INJ SOLN
INTRAVENOUS | Status: AC
  Filled 2016-07-30: qty 10

## 2016-07-30 MED ORDER — FENTANYL 2500MCG IN NS 250ML (10MCG/ML) PREMIX INFUSION
25.0000 ug/h | INTRAVENOUS | Status: DC
  Administered 2016-07-30: 50 ug/h via INTRAVENOUS
  Administered 2016-07-31 – 2016-08-02 (×6): 250 ug/h via INTRAVENOUS
  Filled 2016-07-30 (×7): qty 250

## 2016-07-30 MED ORDER — PHENYLEPHRINE HCL 10 MG/ML IJ SOLN
INTRAMUSCULAR | Status: DC | PRN
  Administered 2016-07-30: 200 ug via INTRAVENOUS
  Administered 2016-07-30 (×2): 100 ug via INTRAVENOUS
  Administered 2016-07-30 (×6): 200 ug via INTRAVENOUS

## 2016-07-30 MED ORDER — METOPROLOL TARTRATE 5 MG/5ML IV SOLN
INTRAVENOUS | Status: AC
  Filled 2016-07-30: qty 10

## 2016-07-30 MED ORDER — SODIUM CHLORIDE 0.9% FLUSH
10.0000 mL | INTRAVENOUS | Status: DC | PRN
  Administered 2016-08-04: 10 mL
  Filled 2016-07-30: qty 40

## 2016-07-30 MED ORDER — PIPERACILLIN-TAZOBACTAM 3.375 G IVPB
INTRAVENOUS | Status: AC
  Filled 2016-07-30: qty 50

## 2016-07-30 MED ORDER — FENTANYL CITRATE (PF) 100 MCG/2ML IJ SOLN
50.0000 ug | Freq: Once | INTRAMUSCULAR | Status: AC
  Administered 2016-07-30: 50 ug via INTRAVENOUS

## 2016-07-30 MED ORDER — FLUCONAZOLE IN SODIUM CHLORIDE 400-0.9 MG/200ML-% IV SOLN
400.0000 mg | INTRAVENOUS | Status: DC
  Administered 2016-07-31 – 2016-08-12 (×12): 400 mg via INTRAVENOUS
  Filled 2016-07-30 (×16): qty 200

## 2016-07-30 MED ORDER — ALTEPLASE 2 MG IJ SOLR
2.0000 mg | Freq: Once | INTRAMUSCULAR | Status: AC | PRN
  Administered 2016-07-30: 2 mg
  Filled 2016-07-30: qty 2

## 2016-07-30 MED ORDER — NOREPINEPHRINE BITARTRATE 1 MG/ML IV SOLN
0.0000 ug/min | INTRAVENOUS | Status: DC
  Administered 2016-07-30: 17:00:00 via INTRAVENOUS
  Administered 2016-07-30 – 2016-07-31 (×2): 2 ug/min via INTRAVENOUS
  Administered 2016-07-31: 12 ug/min via INTRAVENOUS
  Administered 2016-08-01: 9 ug/min via INTRAVENOUS
  Administered 2016-08-01: 10 ug/min via INTRAVENOUS
  Administered 2016-08-01: 6 ug/min via INTRAVENOUS
  Administered 2016-08-02: 8 ug/min via INTRAVENOUS
  Filled 2016-07-30 (×11): qty 4

## 2016-07-30 MED ORDER — ACETAMINOPHEN 10 MG/ML IV SOLN
INTRAVENOUS | Status: AC
  Filled 2016-07-30: qty 100

## 2016-07-30 MED ORDER — ACETAMINOPHEN 10 MG/ML IV SOLN
INTRAVENOUS | Status: DC | PRN
  Administered 2016-07-30: 1000 mg via INTRAVENOUS

## 2016-07-30 MED ORDER — ORAL CARE MOUTH RINSE
15.0000 mL | OROMUCOSAL | Status: DC
  Administered 2016-07-30 – 2016-08-06 (×60): 15 mL via OROMUCOSAL

## 2016-07-30 MED ORDER — MIDAZOLAM HCL 2 MG/2ML IJ SOLN
INTRAMUSCULAR | Status: DC | PRN
  Administered 2016-07-30 (×2): 2 mg via INTRAVENOUS

## 2016-07-30 MED ORDER — PROPOFOL 10 MG/ML IV BOLUS
INTRAVENOUS | Status: DC | PRN
  Administered 2016-07-30: 40 mg via INTRAVENOUS

## 2016-07-30 SURGICAL SUPPLY — 41 items
BULB RESERV EVAC DRAIN JP 100C (MISCELLANEOUS) ×8 IMPLANT
CANISTER SUCT 1200ML W/VALVE (MISCELLANEOUS) IMPLANT
CANISTER SUCT 3000ML (MISCELLANEOUS) ×4 IMPLANT
CATH TRAY 16F METER LATEX (MISCELLANEOUS) IMPLANT
CHLORAPREP W/TINT 26ML (MISCELLANEOUS) ×8 IMPLANT
DRAIN CHANNEL JP 15F RND 16 (MISCELLANEOUS) IMPLANT
DRAIN CHANNEL JP 19F (MISCELLANEOUS) ×8 IMPLANT
DRAIN PENROSE 1/4X12 LTX (DRAIN) ×4 IMPLANT
DRAPE LAPAROTOMY 100X77 ABD (DRAPES) ×4 IMPLANT
DRSG OPSITE POSTOP 4X8 (GAUZE/BANDAGES/DRESSINGS) IMPLANT
ELECT CAUTERY BLADE 6.4 (BLADE) ×4 IMPLANT
ELECT REM PT RETURN 9FT ADLT (ELECTROSURGICAL) ×4
ELECTRODE REM PT RTRN 9FT ADLT (ELECTROSURGICAL) ×2 IMPLANT
GAUZE SPONGE 4X4 12PLY STRL (GAUZE/BANDAGES/DRESSINGS) IMPLANT
GLOVE BIO SURGEON STRL SZ7.5 (GLOVE) ×16 IMPLANT
GLOVE BIOGEL PI IND STRL 6.5 (GLOVE) ×2 IMPLANT
GLOVE BIOGEL PI INDICATOR 6.5 (GLOVE) ×2
GLOVE INDICATOR 8.0 STRL GRN (GLOVE) ×12 IMPLANT
GOWN STRL REUS W/ TWL LRG LVL3 (GOWN DISPOSABLE) ×4 IMPLANT
GOWN STRL REUS W/TWL LRG LVL3 (GOWN DISPOSABLE) ×4
KIT RM TURNOVER STRD PROC AR (KITS) ×4 IMPLANT
LABEL OR SOLS (LABEL) ×4 IMPLANT
LIGASURE IMPACT 36 18CM CVD LR (INSTRUMENTS) ×4 IMPLANT
NS IRRIG 1000ML POUR BTL (IV SOLUTION) ×4 IMPLANT
PACK BASIN MAJOR ARMC (MISCELLANEOUS) IMPLANT
PACK COLON CLEAN CLOSURE (MISCELLANEOUS) ×4 IMPLANT
REMOVER STAPLE SKIN (DISPOSABLE) ×4 IMPLANT
SPONGE KITTNER 5P (MISCELLANEOUS) ×4 IMPLANT
STAPLER SKIN PROX 35W (STAPLE) IMPLANT
SUT ETHILON 3-0 FS-10 30 BLK (SUTURE) ×8
SUT PDS AB 1 TP1 96 (SUTURE) ×4 IMPLANT
SUT SILK 2 0 SH (SUTURE) ×4 IMPLANT
SUT SILK 3 0 (SUTURE) ×2
SUT SILK 3-0 (SUTURE) ×2
SUT SILK 3-0 18XBRD TIE 12 (SUTURE) ×2 IMPLANT
SUT SILK 3-0 SH-1 18XCR BRD (SUTURE) ×2
SUT VIC AB 3-0 SH 27 (SUTURE) ×2
SUT VIC AB 3-0 SH 27X BRD (SUTURE) ×2 IMPLANT
SUT VICRYL 2 TP 1 (SUTURE) ×8 IMPLANT
SUTURE EHLN 3-0 FS-10 30 BLK (SUTURE) ×4 IMPLANT
SUTURE SILK 3-0 SH-1 18XCR BRD (SUTURE) ×2 IMPLANT

## 2016-07-30 NOTE — Anesthesia Post-op Follow-up Note (Cosign Needed)
Anesthesia QCDR form completed.        

## 2016-07-30 NOTE — Progress Notes (Signed)
RT transported patient to OR without complication.

## 2016-07-30 NOTE — Consult Note (Signed)
Name: EMMACLAIRE SHUBA MRN: [id number] DOB: 13-Sep-1965    ADMISSION DATE:  07/20/2016 CONSULTATION DATE:  07/29/16  REFERRING MD : Dr. Burt Knack  CHIEF COMPLAINT: resp distress  BRIEF PATIENT DESCRIPTION: 51 y.o. female with a 3 days hx of abdominal pain in the epigastric area and she thinks it was a gas type. Pain is moderate to severe in intensity and has worsening today. And also some chills. No previous abdominal operations. She states that she takes at least daily BC powder for her headache. He is able to perform more than 4 Mets of activity without any shortness of breath or chest pain. She smokes 1 pack of cigarettes a day. Further workup including a CT scan of the abdomen and pelvis showing extensive amount of free air and some thickening in the TRAM/duodenal junction. Some thickening of the small bowel. S/p repair on 2/16  SIGNIFICANT EVENTS  2/16 1. Laparotomy with Repair of gastric ulcers x 2 using both omental and Falciform as a patch 2. Repair of Umbilical hernia [id number] started on TPN 2/25 resp distress intubated 2/25 transfer tio ICU   HISTORY OF PRESENT ILLNESS:   51 yo white female with perforated ulcer s/p repair now with progressive resp failure and hypoxia with severe sepsis, findings concerning for intra abdominal infection  2/25 decreased saturations and increased respirations and workup breathing.trabnferred to ICU for resp failure Patient emergently intubated  PAST MEDICAL HISTORY :   has no past medical history on file.  has a past surgical history that includes laparotomy (N/A, 07/20/2016). Prior to Admission medications   Medication Sig Start Date End Date Taking? Authorizing Provider  ALPRAZolam (XANAX) 0.25 MG tablet Take 0.25 mg by mouth 3 (three) times daily as needed.  06/14/16  Yes Historical Provider, MD  FLUoxetine (PROZAC) 20 MG capsule Take 40 mg by mouth every morning. 06/15/16  Yes Historical Provider, MD   No Known Allergies  FAMILY HISTORY:    family history is not on file. SOCIAL HISTORY:  reports that she has been smoking Cigarettes.  She has never used smokeless tobacco. She reports that she does not drink alcohol.  REVIEW OF SYSTEMS:   Unobtainable due to critical illness     VITAL SIGNS: Temp:  [98 F (36.7 C)-99.3 F (37.4 C)] 98 F (36.7 C) (02/25 2257) Pulse Rate:  [117-136] 127 (02/26 0106) Resp:  [16-36] 36 (02/26 0106) BP: (106-148)/(63-83) 133/80 (02/25 2257) SpO2:  [90 %-98 %] 91 % (02/26 0106) FiO2 (%):  [98 %] 98 % (02/25 1520) Weight:  [149 lb 6.4 oz (67.8 kg)] 149 lb 6.4 oz (67.8 kg) (02/25 0459)  PHYSICAL EXAMINATION:  GENERAL:critically ill appearing, +resp distress HEAD: Normocephalic, atraumatic.  EYES: Pupils equal, round, reactive to light.  No scleral icterus.  MOUTH: Moist mucosal membrane. NECK: Supple. No thyromegaly. No nodules. No JVD.  PULMONARY: +rhonchi, +wheezing CARDIOVASCULAR: S1 and S2. Regular rate and rhythm. No murmurs, rubs, or gallops.  GASTROINTESTINAL: +distended. absent bowel sounds. MUSCULOSKELETAL: No swelling, clubbing, or edema.  NEUROLOGIC: obtunded SKIN:intact,warm,dry    Recent Labs Lab 07/27/16 0607 07/28/16 0438 07/29/16 0501  NA 134* 133* 132*  K 3.1* 3.7 3.9  CL 105 104 103  CO2 25 24 23   BUN 7 9 9   CREATININE 0.33* 0.38* 0.34*  GLUCOSE 120* 100* 123*    Recent Labs Lab 07/27/16 0607 07/28/16 0438 07/29/16 0501  HGB 7.7* 6.9* 7.1*  HCT 23.3* 22.3* 22.4*  WBC 33.2* 33.7* 36.0*  PLT 412 460* 580*  Dg Chest Port 1 View  Result Date: 07/30/2016 CLINICAL DATA:  Acute onset of respiratory distress. Recent exploratory laparotomy. Initial encounter. EXAM: PORTABLE CHEST 1 VIEW COMPARISON:  Chest radiograph performed 07/29/2016 FINDINGS: Small to moderate right and large left pleural effusions are seen, markedly increased from the prior study. The left lung is largely collapsed due to the large pleural effusion. Underlying vascular congestion is  seen. This is concerning for interstitial edema. No pneumothorax is seen. The cardiomediastinal silhouette is not well assessed due to opacification of the left hemithorax. A right PICC is noted ending about the cavoatrial junction. An enteric tube is noted extending below the diaphragm. A drain is noted at the left upper quadrant. No acute osseous abnormalities are seen. IMPRESSION: Small to moderate right and large left pleural effusions, markedly increased from the prior study. The left lung is largely collapsed due to the large pleural effusion. Underlying vascular congestion seen. This is concerning for interstitial edema. These results were called by telephone at the time of interpretation on 07/30/2016 at 3:23 am to [facility] in the [facility] CCU, who verbally acknowledged these results. Electronically Signed   By: Garald Balding M.D.   On: 07/30/2016 03:25   Dg Chest Port 1 View  Result Date: 07/29/2016 CLINICAL DATA:  51 year old female with tachycardia and increased respiration. EXAM: PORTABLE CHEST 1 VIEW COMPARISON:  Chest chest radiograph dated 07/20/2016 and CT of the abdomen pelvis dated 07/27/2016 FINDINGS: Right-sided PICC with tip over the right cardiac border. An enteric tube is seen extending into the left hemiabdomen with side-port in the left upper abdomen and tip beyond the inferior margin of the image. A left pleural drainage catheter is noted. There is a small left pleural and probable trace right pleural effusions. Bibasilar hazy densities most consistent with atelectatic changes versus infiltrate. There is no pneumothorax. The cardiac silhouette is within normal limits. IMPRESSION: 1. Small bilateral pleural effusions, left greater than right with bibasilar atelectatic changes. Pneumonia is not excluded. 2. Support lines and tubes as described. Electronically Signed   By: Anner Crete M.D.   On: 07/29/2016 06:34   I have Independently reviewed images of CXR scan  07/30/2016 Interpretation: b/l effusions, opacities   ASSESSMENT / PLAN: 51 yo white female with s/p perforated ulcer s/p repair 10 days ago now with progressive and severe hypoxic resp failure with severe sepsis now on full vent support. Patient is critically ill.  Respiratory Failure -continue Full MV support -continue Bronchodilator Therapy -Wean Fio2 and PEEP as tolerated  Septic shock -use vasopressors to keep MAP>65 -follow ABG and LA -follow up cultures -emperic ABX  ABD abscess/intra-abdominal infection Follow up gen surgery recs-plan for OR today -PPI   Critical Care Time devoted to patient care services described in this note is 45 minutes.   Overall, patient is critically ill, prognosis is guarded.  Patient with Multiorgan failure and at high risk for cardiac arrest and death.    Corrin Parker, M.D.  [facility]  Medical Director [place] Director [facility] Cardio-Pulmonary Department

## 2016-07-30 NOTE — Progress Notes (Signed)
10 Days Post-Op   Subjective:  Patient transferred to ICU last night and intubated due to respiratory compromise. Remains intubated and sedated this AM.  Vital signs in last 24 hours: Temp:  [96.1 F (35.6 C)-98.9 F (37.2 C)] 97.5 F (36.4 C) (02/26 1200) Pulse Rate:  [100-135] 100 (02/26 1230) Resp:  [15-37] 24 (02/26 1230) BP: (69-254)/(54-222) 96/69 (02/26 1230) SpO2:  [90 %-99 %] 98 % (02/26 1230) FiO2 (%):  [98 %-100 %] 100 % (02/26 1200) Weight:  [67.8 kg (149 lb 7.6 oz)] 67.8 kg (149 lb 7.6 oz) (02/26 0500) Last BM Date: 07/28/16  Intake/Output from previous day: 02/25 0701 - 02/26 0700 In: 3563.7 [I.V.:2784.9; Blood:620; IV Piggyback:158.8] Out: 195 [Emesis/NG output:175; Drains:20]  GI: Abdomen is distended, drains in place with purulent output. Midline incision intact.  Lab Results:  CBC  Recent Labs  07/29/16 0501 07/30/16 0410  WBC 36.0* 39.2*  HGB 7.1* 9.9*  HCT 22.4* 33.3*  PLT 580* 344   CMP     Component Value Date/Time   NA 131 (L) 07/30/2016 0506   K 4.0 07/30/2016 0506   CL 102 07/30/2016 0506   CO2 25 07/30/2016 0506   GLUCOSE 186 (H) 07/30/2016 0506   BUN 16 07/30/2016 0506   CREATININE 0.41 (L) 07/30/2016 0506   CALCIUM 7.3 (L) 07/30/2016 0506   PROT 5.0 (L) 07/27/2016 0607   ALBUMIN 1.6 (L) 07/30/2016 0506   AST 33 07/27/2016 0607   ALT 16 07/27/2016 0607   ALKPHOS 138 (H) 07/27/2016 0607   BILITOT 0.5 07/27/2016 0607   GFRNONAA >60 07/30/2016 0506   GFRAA >60 07/30/2016 0506   PT/INR  Recent Labs  07/30/16 0410  LABPROT 17.0*  INR 1.37    Studies/Results: Dg Chest 1 View  Result Date: 07/30/2016 CLINICAL DATA:  Right-sided chest tube. EXAM: CHEST 1 VIEW COMPARISON:  Earlier the same day FINDINGS: 1222 hours. Endotracheal tube tip 5.2 cm above the base the carina. NG tube tip positioned in the proximal stomach with proximal port just distal to the esophagogastric junction. Small bore left chest tube is new in the interval.  Interval decrease in left pleural effusion without evidence for left pneumothorax. Right PICC line tip projects in the upper mid right atrium. Bibasilar collapse/consolidation noted with bilateral pleural effusions, left greater than right. Loculated component to the left effusion is suspected. Telemetry leads overlie the chest. Surgical drains overlie the left upper quadrant the abdomen. IMPRESSION: Interval decrease in left pleural effusion status post left pleural drain placement. Electronically Signed   By: Misty Stanley M.D.   On: 07/30/2016 12:43   Dg Chest Port 1 View  Result Date: 07/30/2016 CLINICAL DATA:  Endotracheal tube placement.  Initial encounter. EXAM: PORTABLE CHEST 1 VIEW COMPARISON:  Chest radiograph performed earlier today at 2:53 a.m. FINDINGS: The patient's endotracheal tube is seen ending 2 cm above the carina. A right PICC is noted ending about the distal SVC. An enteric tube is seen extending below the diaphragm. A very large left-sided pleural effusion is again noted. A small right pleural effusion is seen. Vascular congestion is noted, with increased interstitial markings, concerning for mild interstitial edema. No pneumothorax is seen. The cardiothymic silhouette is not well characterized. No acute osseous abnormalities are seen. IMPRESSION: 1. Endotracheal tube seen ending 2 cm above the carina. 2. Very large left-sided pleural effusion again noted. Small right pleural effusion seen. Vascular congestion, with increased interstitial markings, concerning for mild interstitial edema. Electronically Signed   By:  Garald Balding M.D.   On: 07/30/2016 04:36   Dg Chest Port 1 View  Result Date: 07/30/2016 CLINICAL DATA:  Acute onset of respiratory distress. Recent exploratory laparotomy. Initial encounter. EXAM: PORTABLE CHEST 1 VIEW COMPARISON:  Chest radiograph performed 07/29/2016 FINDINGS: Small to moderate right and large left pleural effusions are seen, markedly increased from the  prior study. The left lung is largely collapsed due to the large pleural effusion. Underlying vascular congestion is seen. This is concerning for interstitial edema. No pneumothorax is seen. The cardiomediastinal silhouette is not well assessed due to opacification of the left hemithorax. A right PICC is noted ending about the cavoatrial junction. An enteric tube is noted extending below the diaphragm. A drain is noted at the left upper quadrant. No acute osseous abnormalities are seen. IMPRESSION: Small to moderate right and large left pleural effusions, markedly increased from the prior study. The left lung is largely collapsed due to the large pleural effusion. Underlying vascular congestion seen. This is concerning for interstitial edema. These results were called by telephone at the time of interpretation on 07/30/2016 at 3:23 am to [facility] in the [facility] CCU, who verbally acknowledged these results. Electronically Signed   By: Garald Balding M.D.   On: 07/30/2016 03:25   Dg Chest Port 1 View  Result Date: 07/29/2016 CLINICAL DATA:  51 year old female with tachycardia and increased respiration. EXAM: PORTABLE CHEST 1 VIEW COMPARISON:  Chest chest radiograph dated 07/20/2016 and CT of the abdomen pelvis dated 07/27/2016 FINDINGS: Right-sided PICC with tip over the right cardiac border. An enteric tube is seen extending into the left hemiabdomen with side-port in the left upper abdomen and tip beyond the inferior margin of the image. A left pleural drainage catheter is noted. There is a small left pleural and probable trace right pleural effusions. Bibasilar hazy densities most consistent with atelectatic changes versus infiltrate. There is no pneumothorax. The cardiac silhouette is within normal limits. IMPRESSION: 1. Small bilateral pleural effusions, left greater than right with bibasilar atelectatic changes. Pneumonia is not excluded. 2. Support lines and tubes as described. Electronically  Signed   By: Anner Crete M.D.   On: 07/29/2016 06:34    Assessment/Plan: 51 y/o female s/p repair of peptic ulcers. Now with worsening sepsis most likely from un-drained intra-abdominal infections. Plan to take to the OR for exploration, wide drainage, possible placement of feeding access if deemed possible after performing wide drainage. Consents countersigned by two physicians due to critical illness of patient and no NOK to obtain consent through.   Clayburn Pert, MD [facility]  Day [facility] [phone] Night [facility] [phone]  07/30/2016

## 2016-07-30 NOTE — Progress Notes (Signed)
[facility] Progress Note Patient Name: Lindsey Wolf DOB: [birth year] MRN: [id number]   Date of Service  07/30/2016  HPI/Events of Note  ABG on 100%/PRVC 18/TV 450/P 5 = 7.28/48/117/22.6  eICU Interventions  Will order: 1. Increase PRVC rate to 23.  2. ABG at 7 AM.      Intervention Category Major Interventions: Acid-Base disturbance - evaluation and management;Respiratory failure - evaluation and management  Sommer,Steven Cornelia Copa 07/30/2016, 5:47 AM

## 2016-07-30 NOTE — Anesthesia Procedure Notes (Signed)
Arterial Line Insertion Start/End2/26/2018 3:00 PM, 07/30/2016 3:07 PM Performed by: Emmie Niemann, anesthesiologist  Patient location: Pre-op. Preanesthetic checklist: patient identified, IV checked, site marked, risks and benefits discussed, surgical consent, monitors and equipment checked, pre-op evaluation, timeout performed and anesthesia consent Patient sedated Left, radial was placed Catheter size: 20 Fr Hand hygiene performed , maximum sterile barriers used  and Seldinger technique used  Attempts: 1 Procedure performed without using ultrasound guided technique. Following insertion, dressing applied. Post procedure assessment: normal and unchanged  Patient tolerated the procedure well with no immediate complications.

## 2016-07-30 NOTE — Brief Op Note (Signed)
07/20/2016 - 07/30/2016  5:20 PM  PATIENT:  Lindsey Wolf  51 y.o. female  PRE-OPERATIVE DIAGNOSIS:  peritoneal abscess  POST-OPERATIVE DIAGNOSIS:  peritoneal abscess  PROCEDURE:  Procedure(s): EXPLORATORY LAPAROTOMY drainage peritoneal abscess (N/A) LYSIS OF ADHESION  SURGEON:  Surgeon(s) and Role:    * Clayburn Pert, MD - Primary  PHYSICIAN ASSISTANT:   ASSISTANTS: PA Student   ANESTHESIA:   general  EBL:  Total I/O In: 1486.5 [I.V.:1486.5] Out: 2125 [Urine:125; Blood:100; Chest Tube:1900]  BLOOD ADMINISTERED:none  DRAINS: (three 19 fr) Blake drain(s) in the parahepatic space, retrogastric and right paracolic regions   LOCAL MEDICATIONS USED:  NONE  SPECIMEN:  No Specimen  DISPOSITION OF SPECIMEN:  N/A  COUNTS:  YES  TOURNIQUET:  * No tourniquets in log *  DICTATION: .Dragon Dictation  PLAN OF CARE: return to ICU  PATIENT DISPOSITION:  ICU - intubated and critically ill.   Delay start of Pharmacological VTE agent (>24hrs) due to surgical blood loss or risk of bleeding: no

## 2016-07-30 NOTE — Procedures (Addendum)
PROCEDURE NOTE: Left THORACOSTOMY TUBE PLACEMENT -- ultrasound guided.   Date: 07/30/2016,  [id number] MRN# [id number] Lindsey Wolf    Indication: pleural effusion.     A time-out was completed verifying correct patient, procedure, site, positioning, special catheter was available at the time of the procedure.  The patient was positioned appropriately for chest tube placement. The patient's left chest was prepped and draped in sterile fashion. Ultrasound was performed on the right hemithorax, there was a highly loculated right pleural effusion.   1% Lidocaine was used to anesthetize the surrounding skin area along the mid scapular line approximately the 8th interspace where the best site was found as per finding on thoracic ultrasound.    A 68F thoracostomy tube was placed. The chest tube was sutured securely to the skin and a sterile dressing applied. A pleurevac was attached to the chest tube with drainage of dark pleural fluid, and a chest x-ray, cultures, cytology ordered.   Marda Stalker, M.D.

## 2016-07-30 NOTE — Progress Notes (Signed)
During chest tube insertion procedure, patient heart rate became tachycardic in 190's. Patient rate would fluctuate from 190's then back into 120's. Dr.Ram gave verbal orders and 10mg  of metoprolol was administered. HR decreased to 120-130's, now in atrial fibrillation. Procedure continued. At 1117 patient converted out of Afib. Patient heart rate in ST now.

## 2016-07-30 NOTE — Progress Notes (Signed)
Call tonight for patient with increased respiratory rate a tachycardia. And some difficulty breathing. She had been transfused 2 units of blood earlier yesterday, Sunday.  Patient examined and found to be sitting upright with saturation of 90% on 4 L by nasal cannula. She appears weak and diaphoretic. Heart rate is in the 120s and respirations in the high 20s.  Patient is pale Chest is fairly clear no rales but posteriorly there is diminished breath sounds likely due to bilateral pleural effusions seen on CT scan. Cardiac is regular rate and rhythm but tachycardic Abdomen is soft and minimally tender minimally distended.  With decreased saturations and increased respirations and workup breathing. I believe the patient needs to go to the intensive care unit in anticipation of surgery later today. (Dr. Jamal Collin stated that the patient refused surgery on Sunday but has been set up for surgery on Monday.)  I discussed her care with the intensivist on-call and the need for additional maneuvers to improve her oxygenation. I reviewed with him that she will be going the operating room later today for drainage of her abscesses. I will obtain a chest x-ray and a blood gas.

## 2016-07-30 NOTE — Op Note (Signed)
Pre-operative Diagnosis: Intra-abdominal abscess  Post-operative Diagnosis: Multiple intra-abdominal abscesses  Procedure performed: Reopening of recent laparotomy, extensive lysis of adhesion, drainage of intra-abdominal abscesses  Surgeon: Clayburn Pert   Assistants: Dr. [name]  Anesthesia: General endotracheal anesthesia  ASA Class: 3  Surgeon: Clayburn Pert, MD FACS  Anesthesia: Gen. with endotracheal tube  Assistant:Dr. Genevive Bi  Procedure Details  The patient was seen where she is intubated, sedated, critically ill due to abdominal abscesses. No next of kin were available so an emergent consent was obtained using 2 providers.  The patient was taken to Operating Room, identified as Lindsey Wolf [facility] and the procedure verified.  A Time Out was held and the above information confirmed.  Antibiotic prophylaxis was insured. VTE prophylaxis was in place. General endotracheal anesthesia was then administered and tolerated well. After the induction, the abdomen was prepped with Chloraprep and draped in the sterile fashion. The patient was positioned in the supine position. The previous midline staples were removed and the skin was able to be opened with gentle pressure down to the level of the fascial closure. There was an obvious purulent drainage in the subcutaneous tissues. The interrupted Prolene sutures were cut and removed. The midline incision was widened both cephalad and caudad and exploration was performed.  Extensive adhesions were identified in all quadrants. Greater than 2 hours of lysis of adhesions were undertaken in order to perform visualization of the bowel contents as well as the prior [facility] patch repairs of the peptic ulcers. The majority of the lysis of adhesions was performed sharply with Metzenbaum scissors. Although there were several small serosal injuries there were no full-thickness enterotomies encountered during this procedure. The lysis of adhesions  started in the lower abdomen due to the thinner adhesions and allowed an opening to the lysis of the upper abdominal adhesions.   Going up the patient's right paracolic gutter the previously placed drains were encountered and noted to be within dense adhesions. Once the liver was able to be mobilized off the abdominal wall copious amount of purulent and gelatinous material was able to be removed from the abdomen. The identical procedure was used at the patient's left paracolic gutter again identifying the previously placed drain and using it as a guide for lysis of adhesions. These were then taken to the upper midline freeing up the adhesions along the abdominal wall until the stomach and liver were able to be bluntly dissected away from each other. The previously performed [facility] patch as to the stomach were identified from the falciform and the omentum. Going along the lesser curvature of the stomach entry into the lesser sac was obtained with return of copious amounts of flaky, purulent fluid. This undrained abscess cavity was opened up and completely drained out.  After the previously seen undrained abscesses were fully drained the decision was made to investigate the previous Engineer, structural. The NG tube was palpated within the stomach and methylene blue was inserted through the NG tube. There was no return of fluid within the abdomen or any change of color visualized along the stomach or the small bowel. At this point the entire abdomen was copiously irrigated with warm saline until the irrigation returned clear. The abdominal contents were again investigated and no enterotomies were identified. The previous right-sided drains were removed and replaced with new 19 [place] round Blake drains. The cephalad drain was placed along the capsule liver. The caudad drain was then placed behind the falciform Graham patch and into the  previously undrained abscess cavity in the lesser sac. The previous left-sided  drain was left in place in the paracolic gutter going up to the spleen. All these drains were secured with 3-0 nylon suture.  At this point the midline fascia was reapproximated with a combination of two running looped #1 PDS suture and interrupted figure-of-eight 1-0 Vicryl suture. This allowed the midline fascia to be reapproximated with some tension but in its entirety. The subcutaneous tissue was then copiously irrigated but due to the encountered abscess upon entry the decision was made to reapproximate the skin over a quarter-inch Penrose drain with surgical staples.  The patient tolerated the procedure well and was left intubated at the end of the case. There were no immediate complications and all counts were correct at the end of the procedure. The patient was transferred back to the ICU intubated, sedated, critically ill.  Findings: Undrained abdominal abscesses, extensive adhesions   Estimated Blood Loss: 100 mL         Drains: 19 French round Blake drains x 3         Specimens: None          Complications: None                  Condition: Good   Clayburn Pert, MD, FACS

## 2016-07-30 NOTE — Progress Notes (Signed)
Nutrition Follow-up  DOCUMENTATION CODES:   Not applicable  INTERVENTION:  TPN: nutritional needs reassessed post intubation; recommend continuing current TPN (5%AA/15%Dextrose at rte of 75 ml/hr with 20% ILE at rate of 20 ml/hr for 12 hours) at present. Current TPN provides 2040 mL fluid, 1758 kcals, 90 g of protein (1.3 g/kg). Continue poc and further recommendations to follow post surgery.   NUTRITION DIAGNOSIS:   Inadequate oral intake related to inability to eat as evidenced by NPO status.  Being addressed via TPN  GOAL:   Patient will meet greater than or equal to 90% of their needs   MONITOR:   I & O's, Diet advancement, Labs, Weight trends, Supplement acceptance  REASON FOR ASSESSMENT:   Malnutrition Screening Tool    ASSESSMENT:   Lindsey Wolf is a 51 y.o. female with a 3 days hx of abdominal pain in the epigastric area and she thinks it was a gas type. Pain is moderate to severe in intensity and has worsening today.   Pt became SOB, tachycardic last night, decreased output.   Pt intubated early this AM with respiratory failure Plan for surgery today; ex lap for drainage of large collections ; noted possible plan for G tube, possible J-tube or G/J  No documeted UOP, 2 urine occurrences yesterday. Weight trending down   Patient is currently intubated on ventilator support MV: 10 L/min Temp (24hrs), Avg:98.1 F (36.7 C), Min:96.1 F (35.6 C), Max:99.3 F (37.4 C)  TPN 5%AA/15%Dextrose infusing at rate of 75 ml/hr, 20% ILE at rate of 20 ml/hr for 12 hours daily Labs: sodium 131, albumin 1.6, corrected calcium 9.2, phosphorus 4.7 Meds: levophed, fentanyl  Diet Order:  Diet NPO time specified Except for: Ice Chips .TPN (CLINIMIX-E) Adult  Skin:  Reviewed, no issues  Last BM:  PTA  Height:   Ht Readings from Last 1 Encounters:  07/20/16 5\' 10"  (1.778 m)    Weight:   Wt Readings from Last 1 Encounters:  07/30/16 149 lb 7.6 oz (67.8 kg)    Filed Weights   07/28/16 0509 07/29/16 0459 07/30/16 0500  Weight: 155 lb 14.4 oz (70.7 kg) 149 lb 6.4 oz (67.8 kg) 149 lb 7.6 oz (67.8 kg)     Ideal Body Weight:  68.18 kg  BMI:  Body mass index is 21.45 kg/m.  Estimated Nutritional Needs:   Kcal:  [id number] kcals  Protein:  102-136 g  Fluid:  >/= 1.7 L  EDUCATION NEEDS:   No education needs identified at this time  [place], [place], [place] [phone] Pager  [phone] Weekend/On-Call Pager

## 2016-07-30 NOTE — Progress Notes (Signed)
Patient intubated early this morning for respiratory failure. She is stable on the vent however her systolic blood pressure remains low right after intubation possibly secondary to medications her systolic blood pressure had been 125 shortly before the intubation.  Patient sedated on vent abdomen is soft purulent drainage from midline wound purulent drainage in JP drains.  Chest x-ray demonstrates worsening of a large left pleural effusion. Patient's white cell count remains elevated and now 39,000. H&H has come up after transfusion. Creatinine is normal as is bicarbonate.  Patient will require exploratory laparotomy for drainage of these large collections. She would also benefit from a G-tube and possibly a J-tube or a combined Maas type GJ configuration. This will be discussed with Dr. Adonis Huguenin this morning. Additionally the patient would likely benefit from a left thoracentesis to improve ventilatory performance and enhance ability to extubate in the postoperative period timing of thoracentesis will be left up to Dr. Adonis Huguenin.. I discussed this this patient's care with nursing

## 2016-07-30 NOTE — Anesthesia Procedure Notes (Signed)
Date/Time: 07/30/2016 2:50 PM Performed by: Martha Clan Pre-anesthesia Checklist: Patient identified, Emergency Drugs available, Suction available, Patient being monitored and Timeout performed Patient Re-evaluated:Patient Re-evaluated prior to inductionOxygen Delivery Method: Circle system utilized Preoxygenation: Pre-oxygenation with 100% oxygen Intubation Type: Inhalational induction with existing ETT

## 2016-07-30 NOTE — Plan of Care (Signed)
Problem: Respiratory: Goal: Ability to maintain a clear airway and adequate ventilation will improve Outcome: Progressing Pt. Resting comfortably on ventilator

## 2016-07-30 NOTE — Transfer of Care (Signed)
Immediate Anesthesia Transfer of Care Note  Patient: Lindsey Wolf  Procedure(s) Performed: Procedure(s): EXPLORATORY LAPAROTOMY drainage peritoneal abscess (N/A) LYSIS OF ADHESION  Patient Location: CCU  Anesthesia Type:General  Level of Consciousness: sedated  Airway & Oxygen Therapy: Patient Spontanous Breathing and Patient connected to face mask oxygen  Post-op Assessment: Report given to RN and Post -op Vital signs reviewed and stable  Post vital signs: Reviewed and stable  Last Vitals:  Vitals:   07/30/16 1230 07/30/16 1730  BP: 96/69 (!) 93/52  Pulse: 100   Resp: (!) 24   Temp:      Complications: No apparent anesthesia complications

## 2016-07-30 NOTE — Progress Notes (Signed)
S/p lap for drain of multiple abscesses Doing better ON levophed but now she is weaning off UO ok MAP < 70 Labs ok  PE Sedated, awakens easily and follows simple commands Abd: drain in place serosanguinous fluid, no peritonitis, dressing intact.   A/P Continue broad A/bs, HD, resp support and TPN

## 2016-07-30 NOTE — Procedures (Signed)
Endotracheal Intubation: Patient required placement of an artificial airway secondary to resp failure.   Consent: Emergent.   Hand washing performed prior to starting the procedure.   Medications administered for sedation prior to procedure: Midazolam 4 mg IV,  Vecuronium 10 mg IV, Fentanyl 100 mcg IV.   Procedure: A time out procedure was called and correct patient, name, & ID confirmed. Needed supplies and equipment were assembled and checked to include ETT, 10 ml syringe, Glidescope, Mac and Miller blades, suction, oxygen and bag mask valve, end tidal CO2 monitor. Patient was positioned to align the mouth and pharynx to facilitate visualization of the glottis.  Heart rate, SpO2 and blood pressure was continuously monitored during the procedure. Pre-oxygenation was conducted prior to intubation and endotracheal tube was placed through the vocal cords into the trachea.  During intubation an assistant applied gentle pressure to the cricoid cartilage.   The artificial airway was placed under direct visualization via glidescope route using a 7.5 ETT on the first attempt.    ETT was secured at 24 cm mark.    Placement was confirmed by auscuitation of lungs with good breath sounds bilaterally and no stomach sounds.  Condensation was noted on endotracheal tube.  Pulse ox 96%.  CO2 detector in place with appropriate color change.   Complications: None .   Operator: Caitriona Sundquist.   Chest radiograph ordered and pending.    Corrin Parker, M.D.  [facility]  Medical Director [place] Director [facility] Cardio-Pulmonary Department

## 2016-07-30 NOTE — Progress Notes (Addendum)
eLink Physician-Brief Progress Note Patient Name: INESS FAZZONE DOB: 09-Apr-1966 MRN: [id number]   Date of Service  07/30/2016  HPI/Events of Note  51 yo female s/p ex lap for perforated ulcer. Moved to ICU with hypoxia and increased SOB. CXR - looks volume overloaded with likely L pleural effusion.   eICU Interventions  Will order: 1. Trial of BiPAP. 2. Lasix 40 mg IV now. 3. Dr. Mortimer Fries aware of patient's transfer to ICU.      Intervention Category Evaluation Type: New Patient Evaluation  Lysle Dingwall 07/30/2016, 3:29 AM

## 2016-07-30 NOTE — Anesthesia Preprocedure Evaluation (Addendum)
Anesthesia Evaluation  Patient identified by MRN, date of birth, ID band Patient awake    Reviewed: Allergy & Precautions, NPO status , Patient's Chart, lab work & pertinent test results  History of Anesthesia Complications Negative for: history of anesthetic complications  Airway Mallampati: Intubated       Dental   Pulmonary COPD,  COPD inhaler, Current Smoker,           Cardiovascular negative cardio ROS       Neuro/Psych negative neurological ROS     GI/Hepatic Neg liver ROS, PUD,   Endo/Other  negative endocrine ROS  Renal/GU negative Renal ROS     Musculoskeletal   Abdominal   Peds  Hematology negative hematology ROS (+)   Anesthesia Other Findings Patient recently underwent surgery for a perforated ulcer, and has not improved since that time.  She now has a CT showing a large pelvic abscess that requires draining.  Additionally, she has gone into acute respiratory failure, a large left sided pleural effusion, and has been intubated.  Surgery plan is to drain the abscess and look for any additional perforations.  We will provide a general anesthetic.  No family around to obtain consent for anesthesia or surgery, so based on the emergent nature of the procedure 2 doctors (myself and Dr. Adonis Huguenin) will countersign the consent.   Reproductive/Obstetrics                             Anesthesia Physical  Anesthesia Plan  ASA: III and emergent  Anesthesia Plan: General   Post-op Pain Management:    Induction:   Airway Management Planned: Oral ETT  Additional Equipment:   Intra-op Plan:   Post-operative Plan:   Informed Consent: I have reviewed the patients History and Physical, chart, labs and discussed the procedure including the risks, benefits and alternatives for the proposed anesthesia with the patient or authorized representative who has indicated his/her understanding and  acceptance.     Plan Discussed with:   Anesthesia Plan Comments:        Anesthesia Quick Evaluation

## 2016-07-30 NOTE — Progress Notes (Signed)
Patient transferred to OR with ICU Rn, Respiratory, and an orderly. No complications during transfer.

## 2016-07-30 NOTE — Progress Notes (Addendum)
Pt complained of feeling hot and light headed, approx. 01:00,  respirations at 34, HR 127, SAT's 90 on 4 L O2, BP and Temp stable, MD notified, orders to give Dilaudid 0.5 to help calm Pt. Med given. Pt asleep but HR and Respirations still elevated, MD notified and orders for LR bolus given and med infused, however no big change in HR and respirations. MD notified and came to the unit and assessed the PT. Orders to transfer Pt to ICU for closer monitoring placed. Pt on Venti mask as SAT's at 89 on 4L nasal cannula.

## 2016-07-30 NOTE — Progress Notes (Signed)
PHARMACY - ADULT TOTAL PARENTERAL NUTRITION CONSULT NOTE   Pharmacy Consult for TPN Indication: Perforated ulcer  Patient Measurements: Height: 5\' 10"  (177.8 cm) Weight: 149 lb 7.6 oz (67.8 kg) IBW/kg (Calculated) : 68.5 TPN AdjBW (KG): 58.8 Body mass index is 21.45 kg/m. Usual Weight:   Assessment: Pharmacy consulted to assist in the management of electrolytes and glucose in this 51 year old female being starting on parenteral nutrition.   GI:  Endo:  Insulin requirements in the past 24 hours: 10 units Lytes: All electrolytes wnl  Renal: Pulm: Cards:  Hepatobil: Neuro: ID:  Best Practices: TPN Access: TPN start date:  Nutritional Goals (per RD recommendation on ): kCal: Protein:   Current Nutrition:   Plan:  Will continue Clinimix E 5/15  8ml/hr with Trace elements and MVI along with lipids 20% @ 20 ml/hr for 12 hours. SSI ordered q4h checks.  Will recheck electrolytes with am labs.   Napoleon Form, RPh Clinical Pharmacist 07/30/2016,12:23 PM

## 2016-07-30 NOTE — Progress Notes (Signed)
Pt. Was transferred from 2 C with tachypnea and tachycardia on a venti mask. E-link was alerted, Dr. Mortimer Fries was called in to intubate. Pt. Was placed on Bipap, 40 of lasix, foley placed, rainbow labs. Pt. Intubated without complication, fentanyl and levo started. Dr. Burt Knack and pt.'s emergency contact were both updated on events. VSS on levo @ 60mcg.  Pt. Will go to surgery today, but unsure on timing. Report given to [facility]

## 2016-07-30 NOTE — Progress Notes (Signed)
RT pulled ETT back from 23 at lip to 21.5 at lip per Dr. Juanell Fairly order without complication.

## 2016-07-31 ENCOUNTER — Inpatient Hospital Stay: Payer: BLUE CROSS/BLUE SHIELD

## 2016-07-31 ENCOUNTER — Encounter: Payer: Self-pay | Admitting: General Surgery

## 2016-07-31 LAB — BASIC METABOLIC PANEL
Anion gap: 4 — ABNORMAL LOW (ref 5–15)
BUN: 24 mg/dL — AB (ref 6–20)
CHLORIDE: 101 mmol/L (ref 101–111)
CO2: 25 mmol/L (ref 22–32)
CREATININE: 0.54 mg/dL (ref 0.44–1.00)
Calcium: 7.1 mg/dL — ABNORMAL LOW (ref 8.9–10.3)
GFR calc Af Amer: >60 mL/min (ref >60)
GFR calc non Af Amer: >60 mL/min (ref >60)
GLUCOSE: 140 mg/dL — AB (ref 65–99)
POTASSIUM: 4.4 mmol/L (ref 3.5–5.1)
Sodium: 130 mmol/L — ABNORMAL LOW (ref 135–145)

## 2016-07-31 LAB — AEROBIC/ANAEROBIC CULTURE W GRAM STAIN (SURGICAL/DEEP WOUND): Culture: NO GROWTH

## 2016-07-31 LAB — CBC
HEMATOCRIT: 28.9 % — AB (ref 35.0–47.0)
HEMOGLOBIN: 9.4 g/dL — AB (ref 12.0–16.0)
MCH: 24.3 pg — AB (ref 26.0–34.0)
MCHC: 32.5 g/dL (ref 32.0–36.0)
MCV: 74.7 fL — AB (ref 80.0–100.0)
Platelets: 366 10*3/uL (ref 150–440)
RBC: 3.87 MIL/uL (ref 3.80–5.20)
RDW: 25.3 % — ABNORMAL HIGH (ref 11.5–14.5)
WBC: 34.4 10*3/uL — ABNORMAL HIGH (ref 3.6–11.0)

## 2016-07-31 LAB — GLUCOSE, CAPILLARY
GLUCOSE-CAPILLARY: 125 mg/dL — AB (ref 65–99)
GLUCOSE-CAPILLARY: 118 mg/dL — AB (ref 65–99)
Glucose-Capillary: 113 mg/dL — ABNORMAL HIGH (ref 65–99)
Glucose-Capillary: 118 mg/dL — ABNORMAL HIGH (ref 65–99)
Glucose-Capillary: 126 mg/dL — ABNORMAL HIGH (ref 65–99)
Glucose-Capillary: 129 mg/dL — ABNORMAL HIGH (ref 65–99)
Glucose-Capillary: 135 mg/dL — ABNORMAL HIGH (ref 65–99)

## 2016-07-31 LAB — CYTOLOGY - NON PAP

## 2016-07-31 LAB — PHOSPHORUS: Phosphorus: 4.6 mg/dL (ref 2.5–4.6)

## 2016-07-31 LAB — BLOOD GAS, ARTERIAL
Acid-base deficit: 3.5 mmol/L — ABNORMAL HIGH (ref 0.0–2.0)
BICARBONATE: 23.1 mmol/L (ref 20.0–28.0)
O2 Saturation: 76.5 %
PATIENT TEMPERATURE: 37
pCO2 arterial: 47 mmHg (ref 32.0–48.0)
pH, Arterial: 7.3 — ABNORMAL LOW (ref 7.350–7.450)
pO2, Arterial: 46 mmHg — ABNORMAL LOW (ref 83.0–108.0)

## 2016-07-31 LAB — PROCALCITONIN: Procalcitonin: 2.14 ng/mL

## 2016-07-31 LAB — AEROBIC/ANAEROBIC CULTURE (SURGICAL/DEEP WOUND)

## 2016-07-31 LAB — MAGNESIUM: Magnesium: 1.9 mg/dL (ref 1.7–2.4)

## 2016-07-31 MED ORDER — AMIODARONE LOAD VIA INFUSION
150.0000 mg | Freq: Once | INTRAVENOUS | Status: AC
  Administered 2016-07-31: 150 mg via INTRAVENOUS
  Filled 2016-07-31: qty 83.34

## 2016-07-31 MED ORDER — FAT EMULSION 20 % IV EMUL
250.0000 mL | INTRAVENOUS | Status: AC
  Administered 2016-08-01: 250 mL via INTRAVENOUS
  Filled 2016-07-31: qty 250

## 2016-07-31 MED ORDER — ALBUMIN HUMAN 25 % IV SOLN
12.5000 g | Freq: Once | INTRAVENOUS | Status: AC
Start: 2016-07-31 — End: 2016-07-31
  Administered 2016-07-31: 12.5 g via INTRAVENOUS
  Filled 2016-07-31: qty 50

## 2016-07-31 MED ORDER — DILTIAZEM HCL 25 MG/5ML IV SOLN
10.0000 mg | Freq: Once | INTRAVENOUS | Status: AC
  Administered 2016-07-31: 10 mg via INTRAVENOUS

## 2016-07-31 MED ORDER — LACTATED RINGERS IV SOLN
INTRAVENOUS | Status: DC
  Administered 2016-07-31: 10:00:00 via INTRAVENOUS
  Administered 2016-07-31: 100 mL/h via INTRAVENOUS
  Administered 2016-08-01 – 2016-08-04 (×2): via INTRAVENOUS

## 2016-07-31 MED ORDER — DILTIAZEM HCL 25 MG/5ML IV SOLN
INTRAVENOUS | Status: AC
  Administered 2016-07-31: 10 mg via INTRAVENOUS
  Filled 2016-07-31: qty 5

## 2016-07-31 MED ORDER — METOPROLOL TARTRATE 5 MG/5ML IV SOLN
2.5000 mg | INTRAVENOUS | Status: DC | PRN
  Administered 2016-07-31 – 2016-08-03 (×3): 2.5 mg via INTRAVENOUS
  Filled 2016-07-31 (×2): qty 5

## 2016-07-31 MED ORDER — AMIODARONE HCL IN DEXTROSE 360-4.14 MG/200ML-% IV SOLN
30.0000 mg/h | INTRAVENOUS | Status: DC
  Administered 2016-07-31 – 2016-08-03 (×7): 30 mg/h via INTRAVENOUS
  Filled 2016-07-31 (×6): qty 200

## 2016-07-31 MED ORDER — METOPROLOL TARTRATE 5 MG/5ML IV SOLN
INTRAVENOUS | Status: AC
  Filled 2016-07-31: qty 5

## 2016-07-31 MED ORDER — TRACE MINERALS CR-CU-MN-SE-ZN 10-1000-500-60 MCG/ML IV SOLN
INTRAVENOUS | Status: AC
  Administered 2016-07-31: 18:00:00 via INTRAVENOUS
  Filled 2016-07-31: qty 1992

## 2016-07-31 MED ORDER — AMIODARONE HCL IN DEXTROSE 360-4.14 MG/200ML-% IV SOLN
60.0000 mg/h | INTRAVENOUS | Status: DC
  Administered 2016-07-31 (×2): 60 mg/h via INTRAVENOUS
  Filled 2016-07-31: qty 200

## 2016-07-31 NOTE — Progress Notes (Signed)
[facility] Progess Note    ASSESSMENT/PLAN   51 yo female presented abdominal pain found to have perforated ulcers with multiple abdominal abscesses. S/p OR on 2/27.   PULMONARY A:Acute hypoxic respiratory failure.  Left loculated pleural effusion, likely reactive empyema to abdominal abscesses.  Left chest tube output 2300 yesterday.  P:   Await chest tube cultures.  Continue chest tube drainage.   CARDIOVASCULAR A: Septic shock with hypotension.  P:  Levophed as needed for MAP>65.  --Replace tube losses with IVF.   RENAL A:  --  GASTROINTESTINAL A:  Perforated ulcer.  P:   Continue protonix.   HEMATOLOGIC A:  Leukocytosis is likely secondary, reactive.  P:  --  INFECTIOUS A:  Abdominal abscesses, s/p drainage.  P:     Micro/culture results:  BCx2 2/16; negative.  UC -- Sputum -- MRSA PCR 2/16; negative.  Pleural fluid culture 2/26; pending.    Antibiotics: Zosyn 2/17>>  ENDOCRINE A:  Glucose controlled. Continue SSI.   NEUROLOGIC A:  --Continue sedation.    MAJOR EVENTS/TEST RESULTS: 2/26; washout of abdominal abscess.  2/26; left chest tube placement.  2/21; IR placement of 12 Fr abscess drain.  2/16; Lap repair of perf gastric ulcers  Best Practices  DVT Prophylaxis: enoxaparin GI Prophylaxis: protonix.    ---------------------------------------   ----------------------------------------   Name: Lindsey Wolf MRN: [id number] DOB: 07-26-1965    ADMISSION DATE:  07/20/2016   SUBJECTIVE:   Pt currently on the ventilator, can not provide history or review of systems.   Review of Systems:  Could not obtain, on vent.    VITAL SIGNS: Temp:  [97.5 F (36.4 C)-98.4 F (36.9 C)] 97.5 F (36.4 C) (02/27 0800) Pulse Rate:  [85-124] 107 (02/27 0900) Resp:  [21-24] 23 (02/27 0900) BP: (79-121)/(50-95) 80/56 (02/27 0900) SpO2:  [96 %-99 %] 98 % (02/27 0900) Arterial Line BP: (70-123)/(40-76) 70/46  (02/27 0900) FiO2 (%):  [50 %-100 %] 50 % (02/27 0725) Weight:  [155 lb 13.8 oz (70.7 kg)] 155 lb 13.8 oz (70.7 kg) (02/27 0434) HEMODYNAMICS:   VENTILATOR SETTINGS: Vent Mode: PRVC FiO2 (%):  [50 %-100 %] 50 % Set Rate:  [23 bmp] 23 bmp Vt Set:  [450 mL] 450 mL PEEP:  [5 cmH20] 5 cmH20 Plateau Pressure:  [15 cmH20] 15 cmH20 INTAKE / OUTPUT:  Intake/Output Summary (Last 24 hours) at 07/31/16 0924 Last data filed at 07/31/16 0800  Gross per 24 hour  Intake           3765.1 ml  Output             3855 ml  Net            -89.9 ml    PHYSICAL EXAMINATION: Physical Examination:   VS: BP (!) 80/56   Pulse (!) 107   Temp 97.5 F (36.4 C) (Axillary)   Resp (!) 23   Ht 5\' 10"  (1.778 m)   Wt 155 lb 13.8 oz (70.7 kg)   SpO2 98%   BMI 22.36 kg/m   General Appearance: No distress  Neuro:without focal findings, mental status reduced.  HEENT: PERRLA, EOM intact. Pulmonary: normal breath sounds.  CardiovascularNormal S1,S2.  No m/r/g.   Abdomen: Benign, Soft, non-tender. Renal:  No costovertebral tenderness  GU:  Not performed at this time. Endocrine: No evident thyromegaly. Skin:   warm, no rashes, no ecchymosis  Extremities: normal, no cyanosis, clubbing.   LABS:   LABORATORY PANEL:   CBC  Recent Labs Lab 07/31/16 0435  WBC 34.4*  HGB 9.4*  HCT 28.9*  PLT 366    Chemistries   Recent Labs Lab 07/27/16 0607  07/31/16 0435  NA 134*  < > 130*  K 3.1*  < > 4.4  CL 105  < > 101  CO2 25  < > 25  GLUCOSE 120*  < > 140*  BUN 7  < > 24*  CREATININE 0.33*  < > 0.54  CALCIUM 7.3*  < > 7.1*  MG 1.7  < > 1.9  PHOS  --   < > 4.6  AST 33  --   --   ALT 16  --   --   ALKPHOS 138*  --   --   BILITOT 0.5  --   --   < > = values in this interval not displayed.   Recent Labs Lab 07/30/16 0723 07/30/16 1125 07/30/16 1942 07/30/16 2357 07/31/16 0405 07/31/16 0740  GLUCAP 186* 165* 140* 147* 135* 125*    Recent Labs Lab 07/30/16 0300 07/30/16 0530  07/30/16 0635  PHART 7.30* 7.28* 7.35  PCO2ART 47 48 40  PO2ART 46* 117* 142*    Recent Labs Lab 07/26/16 0518 07/27/16 0607 07/29/16 0501 07/30/16 0506  AST 25 33  --   --   ALT 14 16  --   --   ALKPHOS 113 138*  --   --   BILITOT 0.7 0.5  --   --   ALBUMIN 1.7* 1.8* 1.7* 1.6*    Cardiac Enzymes No results for input(s): TROPONINI in the last 168 hours.  RADIOLOGY:  Dg Chest 1 View  Result Date: 07/31/2016 CLINICAL DATA:  Status post abdominal surgery for adhesions; laparotomy also on July 20, 2016 for perforated ulcer. Current smoker EXAM: CHEST 1 VIEW COMPARISON:  Portable chest x-ray of July 30, 2016 FINDINGS: The lungs are adequately inflated. Small bilateral pleural effusions are present greatest on the left. The interstitial markings are increased. Bibasilar densities persist. The heart is normal in size. The pulmonary vascularity is indistinct. The endotracheal tube tip projects 5.5 cm above the carina. The esophagogastric tube tip projects below the inferior margin of the image. The right-sided PICC line tip projects over the cavoatrial junction. The small caliber left-sided chest tube is in stable position with the tip projecting just above the left lateral costophrenic gutter. Surgical drainage tubes are present within the upper abdomen. IMPRESSION: Slight interval improvement in the appearance of the chest with decreased pleural fluid volume on the left. No pneumothorax. Persistent bibasilar atelectasis or infiltrate. Mild central pulmonary vascular congestion. Electronically Signed   By: David  Martinique M.D.   On: 07/31/2016 07:08   Dg Chest 1 View  Result Date: 07/30/2016 CLINICAL DATA:  Right-sided chest tube. EXAM: CHEST 1 VIEW COMPARISON:  Earlier the same day FINDINGS: 1222 hours. Endotracheal tube tip 5.2 cm above the base the carina. NG tube tip positioned in the proximal stomach with proximal port just distal to the esophagogastric junction. Small bore left  chest tube is new in the interval. Interval decrease in left pleural effusion without evidence for left pneumothorax. Right PICC line tip projects in the upper mid right atrium. Bibasilar collapse/consolidation noted with bilateral pleural effusions, left greater than right. Loculated component to the left effusion is suspected. Telemetry leads overlie the chest. Surgical drains overlie the left upper quadrant the abdomen. IMPRESSION: Interval decrease in left pleural effusion status post left pleural drain placement. Electronically Signed  By: Misty Stanley M.D.   On: 07/30/2016 12:43   Dg Chest Port 1 View  Result Date: 07/30/2016 CLINICAL DATA:  Endotracheal tube placement.  Initial encounter. EXAM: PORTABLE CHEST 1 VIEW COMPARISON:  Chest radiograph performed earlier today at 2:53 a.m. FINDINGS: The patient's endotracheal tube is seen ending 2 cm above the carina. A right PICC is noted ending about the distal SVC. An enteric tube is seen extending below the diaphragm. A very large left-sided pleural effusion is again noted. A small right pleural effusion is seen. Vascular congestion is noted, with increased interstitial markings, concerning for mild interstitial edema. No pneumothorax is seen. The cardiothymic silhouette is not well characterized. No acute osseous abnormalities are seen. IMPRESSION: 1. Endotracheal tube seen ending 2 cm above the carina. 2. Very large left-sided pleural effusion again noted. Small right pleural effusion seen. Vascular congestion, with increased interstitial markings, concerning for mild interstitial edema. Electronically Signed   By: Garald Balding M.D.   On: 07/30/2016 04:36   Dg Chest Port 1 View  Result Date: 07/30/2016 CLINICAL DATA:  Acute onset of respiratory distress. Recent exploratory laparotomy. Initial encounter. EXAM: PORTABLE CHEST 1 VIEW COMPARISON:  Chest radiograph performed 07/29/2016 FINDINGS: Small to moderate right and large left pleural effusions  are seen, markedly increased from the prior study. The left lung is largely collapsed due to the large pleural effusion. Underlying vascular congestion is seen. This is concerning for interstitial edema. No pneumothorax is seen. The cardiomediastinal silhouette is not well assessed due to opacification of the left hemithorax. A right PICC is noted ending about the cavoatrial junction. An enteric tube is noted extending below the diaphragm. A drain is noted at the left upper quadrant. No acute osseous abnormalities are seen. IMPRESSION: Small to moderate right and large left pleural effusions, markedly increased from the prior study. The left lung is largely collapsed due to the large pleural effusion. Underlying vascular congestion seen. This is concerning for interstitial edema. These results were called by telephone at the time of interpretation on 07/30/2016 at 3:23 am to [facility] in the [facility] CCU, who verbally acknowledged these results. Electronically Signed   By: Garald Balding M.D.   On: 07/30/2016 03:25       --Marda Stalker, MD.  ICU Pager: [phone]  Pulmonary and Critical Care Office Number: [phone]  Patricia Pesa, M.D.  Vilinda Boehringer, M.D.  Merton Border, M.D  07/31/2016   Critical Care Attestation.  I have personally obtained a history, examined the patient, evaluated laboratory and imaging results, formulated the assessment and plan and placed orders. The Patient requires high complexity decision making for assessment and support, frequent evaluation and titration of therapies, application of advanced monitoring technologies and extensive interpretation of multiple databases. The patient has critical illness that could lead imminently to failure of 1 or more organ systems and requires the highest level of physician preparedness to intervene.  Critical Care Time devoted to patient care services described in this note is 45 minutes and is exclusive of  time spent in procedures.

## 2016-07-31 NOTE — Progress Notes (Signed)
1 Day Post-Op   Subjective:  Patient maintained on the ventilator overnight with intermittent need for vasopressor medications. Has tolerated weaning of her event but had transition of drainage output from serosanguineous to blue in the right lower quadrant drain overnight.  Vital signs in last 24 hours: Temp:  [97.5 F (36.4 C)-97.8 F (36.6 C)] 97.7 F (36.5 C) (02/27 1200) Pulse Rate:  [85-123] 123 (02/27 1200) Resp:  [15-23] 20 (02/27 1200) BP: (79-121)/(45-95) 101/56 (02/27 1200) SpO2:  [91 %-99 %] 93 % (02/27 1200) Arterial Line BP: (70-123)/(40-76) 79/57 (02/27 1200) FiO2 (%):  [28 %-90 %] 28 % (02/27 1115) Weight:  [70.7 kg (155 lb 13.8 oz)] 70.7 kg (155 lb 13.8 oz) (02/27 0434) Last BM Date: 07/28/16  Intake/Output from previous day: 02/26 0701 - 02/27 0700 In: 3856.4 [I.V.:3856.4] Out: 3655 [Urine:950; Emesis/NG output:30; Drains:275; Blood:100; Chest Tube:2300]  GI: Abdomen soft, mildly distended. Midline staples in place within her strain with appropriate drainage of. Bottom. JP drains in place 3. The right upper and left sided drains with serous and was output. The right lower drain with a blue output consistent with the methylene blue.  Lab Results:  CBC  Recent Labs  07/30/16 0410 07/31/16 0435  WBC 39.2* 34.4*  HGB 9.9* 9.4*  HCT 33.3* 28.9*  PLT 344 366   CMP     Component Value Date/Time   NA 130 (L) 07/31/2016 0435   K 4.4 07/31/2016 0435   CL 101 07/31/2016 0435   CO2 25 07/31/2016 0435   GLUCOSE 140 (H) 07/31/2016 0435   BUN 24 (H) 07/31/2016 0435   CREATININE 0.54 07/31/2016 0435   CALCIUM 7.1 (L) 07/31/2016 0435   PROT 5.0 (L) 07/27/2016 0607   ALBUMIN 1.6 (L) 07/30/2016 0506   AST 33 07/27/2016 0607   ALT 16 07/27/2016 0607   ALKPHOS 138 (H) 07/27/2016 0607   BILITOT 0.5 07/27/2016 0607   GFRNONAA >60 07/31/2016 0435   GFRAA >60 07/31/2016 0435   PT/INR  Recent Labs  07/30/16 0410  LABPROT 17.0*  INR 1.37     Studies/Results: Dg Chest 1 View  Result Date: 07/31/2016 CLINICAL DATA:  Status post abdominal surgery for adhesions; laparotomy also on July 20, 2016 for perforated ulcer. Current smoker EXAM: CHEST 1 VIEW COMPARISON:  Portable chest x-ray of July 30, 2016 FINDINGS: The lungs are adequately inflated. Small bilateral pleural effusions are present greatest on the left. The interstitial markings are increased. Bibasilar densities persist. The heart is normal in size. The pulmonary vascularity is indistinct. The endotracheal tube tip projects 5.5 cm above the carina. The esophagogastric tube tip projects below the inferior margin of the image. The right-sided PICC line tip projects over the cavoatrial junction. The small caliber left-sided chest tube is in stable position with the tip projecting just above the left lateral costophrenic gutter. Surgical drainage tubes are present within the upper abdomen. IMPRESSION: Slight interval improvement in the appearance of the chest with decreased pleural fluid volume on the left. No pneumothorax. Persistent bibasilar atelectasis or infiltrate. Mild central pulmonary vascular congestion. Electronically Signed   By: David  Martinique M.D.   On: 07/31/2016 07:08   Dg Chest 1 View  Result Date: 07/30/2016 CLINICAL DATA:  Right-sided chest tube. EXAM: CHEST 1 VIEW COMPARISON:  Earlier the same day FINDINGS: 1222 hours. Endotracheal tube tip 5.2 cm above the base the carina. NG tube tip positioned in the proximal stomach with proximal port just distal to the esophagogastric junction. Small bore  left chest tube is new in the interval. Interval decrease in left pleural effusion without evidence for left pneumothorax. Right PICC line tip projects in the upper mid right atrium. Bibasilar collapse/consolidation noted with bilateral pleural effusions, left greater than right. Loculated component to the left effusion is suspected. Telemetry leads overlie the chest.  Surgical drains overlie the left upper quadrant the abdomen. IMPRESSION: Interval decrease in left pleural effusion status post left pleural drain placement. Electronically Signed   By: Misty Stanley M.D.   On: 07/30/2016 12:43   Dg Chest Port 1 View  Result Date: 07/30/2016 CLINICAL DATA:  Endotracheal tube placement.  Initial encounter. EXAM: PORTABLE CHEST 1 VIEW COMPARISON:  Chest radiograph performed earlier today at 2:53 a.m. FINDINGS: The patient's endotracheal tube is seen ending 2 cm above the carina. A right PICC is noted ending about the distal SVC. An enteric tube is seen extending below the diaphragm. A very large left-sided pleural effusion is again noted. A small right pleural effusion is seen. Vascular congestion is noted, with increased interstitial markings, concerning for mild interstitial edema. No pneumothorax is seen. The cardiothymic silhouette is not well characterized. No acute osseous abnormalities are seen. IMPRESSION: 1. Endotracheal tube seen ending 2 cm above the carina. 2. Very large left-sided pleural effusion again noted. Small right pleural effusion seen. Vascular congestion, with increased interstitial markings, concerning for mild interstitial edema. Electronically Signed   By: Garald Balding M.D.   On: 07/30/2016 04:36   Dg Chest Port 1 View  Result Date: 07/30/2016 CLINICAL DATA:  Acute onset of respiratory distress. Recent exploratory laparotomy. Initial encounter. EXAM: PORTABLE CHEST 1 VIEW COMPARISON:  Chest radiograph performed 07/29/2016 FINDINGS: Small to moderate right and large left pleural effusions are seen, markedly increased from the prior study. The left lung is largely collapsed due to the large pleural effusion. Underlying vascular congestion is seen. This is concerning for interstitial edema. No pneumothorax is seen. The cardiomediastinal silhouette is not well assessed due to opacification of the left hemithorax. A right PICC is noted ending about the  cavoatrial junction. An enteric tube is noted extending below the diaphragm. A drain is noted at the left upper quadrant. No acute osseous abnormalities are seen. IMPRESSION: Small to moderate right and large left pleural effusions, markedly increased from the prior study. The left lung is largely collapsed due to the large pleural effusion. Underlying vascular congestion seen. This is concerning for interstitial edema. These results were called by telephone at the time of interpretation on 07/30/2016 at 3:23 am to [facility] in the [facility] CCU, who verbally acknowledged these results. Electronically Signed   By: Garald Balding M.D.   On: 07/30/2016 03:25    Assessment/Plan: 52 year old female status post complicated repair peptic ulcer disease. Yesterday he underwent a repeat laparotomy for drainage of intra-abdominal abscesses. Methylene blue was instilled through the NG tube yesterday to test her ulcer repairs without leaking in the operating room. The presence of methylene blue was in her JP drain this morning indicates leakage from the same or a new location within her GI tract. Clinically she is improving. At this point plan to continue treatment medically, repeat CT scan in the morning, likely return to the OR tomorrow for evaluation of her stomach and placement of a postpyloric feeding tube at that time. Appreciate critical care assistance with this complicated patient.   Clayburn Pert, MD [facility]  Day [facility] [phone] Night [facility] [phone]  07/31/2016  

## 2016-07-31 NOTE — Progress Notes (Signed)
  Amiodarone Drug - Drug Interaction Consult Note  Recommendations: Patient is on fluconazole which is know to prolong the QT interval in combination with amiodarone. Please consider frequent EKG monitoring if patient is to continue both agents. Ondansetron may also prolong the QT interval, but it is ordered PRN and patient has not required doses.   Amiodarone is metabolized by the cytochrome P450 system and therefore has the potential to cause many drug interactions. Amiodarone has an average plasma half-life of 50 days (range 20 to 100 days).   There is potential for drug interactions to occur several weeks or months after stopping treatment and the onset of drug interactions may be slow after initiating amiodarone.    [x]  Drugs that prolong the QT interval:  Torsades de pointes risk may be increased with concurrent use - avoid if possible.  Monitor QTc, also keep magnesium/potassium WNL if concurrent therapy can't be avoided. Marland Kitchen Antibiotics: e.g. fluoroquinolones, erythromycin. . Antiarrhythmics: e.g. quinidine, procainamide, disopyramide, sotalol. . Antipsychotics: e.g. phenothiazines, haloperidol.  . Lithium, tricyclic antidepressants, and methadone.  Thank You,  Napoleon Form  07/31/2016 2:59 PM

## 2016-07-31 NOTE — Progress Notes (Signed)
Nutrition Follow-up  DOCUMENTATION CODES:   Not applicable  INTERVENTION:  TPN: recommend increasing 5%AA/15%Dextrose to rate of 83 ml/hr; recommend changing 20% ILE at 20 ml/hr for 12 hours from daily to 3 days a week only at present (Plan to infuse on Monday, Wednesday, Friday). Provides 100g of protein, 1620 kcals, 1992 mL of fluid on average per day. Continue to assesss   NUTRITION DIAGNOSIS:   Inadequate oral intake related to inability to eat as evidenced by NPO status.  Being addressed via TPN  GOAL:   Patient will meet greater than or equal to 90% of their needs  MONITOR:   I & O's, Diet advancement, Labs, Weight trends, Supplement acceptance  REASON FOR ASSESSMENT:   Malnutrition Screening Tool    ASSESSMENT:   Patient is currently intubated on ventilator support Pt with multiple intra-abdominal abscesses s/p reopening of recent laparotomy, extensive lysis of adhesions and drainage of abcesses TPN 5%AA/15%Dextrose infusing at rate of 75 ml/hr with 20% ILE at rate of 20 ml/hr for 12 hours Labs: sodium 130 Meds: LR at 100 ml/hr, ss novolog, levophed  Diet Order:  Diet NPO time specified Except for: Ice Chips .TPN (CLINIMIX-E) Adult  Skin:  Reviewed, no issues  Last BM:  PTA  Height:   Ht Readings from Last 1 Encounters:  07/20/16 5\' 10"  (1.778 m)    Weight:   Wt Readings from Last 1 Encounters:  07/31/16 155 lb 13.8 oz (70.7 kg)    Ideal Body Weight:  68.18 kg  BMI:  Body mass index is 22.36 kg/m.  Estimated Nutritional Needs:   Kcal:  [id number] kcals  Protein:  102-136 g  Fluid:  >/= 1.7 L  EDUCATION NEEDS:   No education needs identified at this time  Lindsey Wolf, [place], [place] [phone] Pager  [phone] Weekend/On-Call Pager

## 2016-07-31 NOTE — Progress Notes (Signed)
The patient has remained sedated but arousable, denies chest pain or discomfort at this time. Dr. Adonis Huguenin following patient and aware of blue-green output in the right lower abdominal drain as well as amount of drainage being recorded. Per Dr. Adonis Huguenin, NG tube switched to low continuous suction.  Per Dr. Ashby Dawes- suction to chest tube discontinued. Patient re-started on Levophed due to low BP and MAP's. Patient having several episodes of increased HR up to 200's. Per MD, 10 cardizem given w/ o relief- patient then started on Amio drip w. Bolus given per MD order.  Dr. Adonis Huguenin aware of current patient status. Patient turned q2hours, dressings monitored- no new drainage noted. UOP noted. Report given to Tamala Julian, RN and care transferred.

## 2016-07-31 NOTE — Progress Notes (Signed)
PHARMACY - ADULT TOTAL PARENTERAL NUTRITION CONSULT NOTE   Pharmacy Consult for TPN Indication: Perforated ulcer  Patient Measurements: Height: 5\' 10"  (177.8 cm) Weight: 155 lb 13.8 oz (70.7 kg) IBW/kg (Calculated) : 68.5 TPN AdjBW (KG): 58.8 Body mass index is 22.36 kg/m. Usual Weight:   Assessment: Pharmacy consulted to assist in the management of electrolytes and glucose in this 51 year old female being starting on parenteral nutrition.   GI:  Endo:  Insulin requirements in the past 24 hours: 6 units Lytes: All electrolytes wnl  Renal: Pulm: Cards:  Hepatobil: Neuro: ID:  Best Practices: TPN Access: TPN start date:  Nutritional Goals (per RD recommendation on ): kCal: Protein:   Current Nutrition:   Plan:  Will increase Clinimix E 5/15 to 83 ml/hr with Trace elements and MVI along with lipids 20% @ 20 ml/hr for 12 hours three times weekly on M/W/F per dietary's recommendations.  SSI ordered q4h checks.  Will recheck electrolytes with am labs.   Ulice Dash D Clinical Pharmacist 07/31/2016,12:15 PM

## 2016-07-31 NOTE — Progress Notes (Signed)
This patient has remained sedated throughout this writer's shift. Fentanyl was gradually increased to 256mcg/hr from 45mcg/hr d/t to patient restlessness and her nodding yes to being in pain. Dilaudid given x 1 w/ relief. Patient had 2 episodes of irregular sinus tach in the 120's-130's. 2.5 mg Metoprolol IV given w/ each episode w/ resolve. I was able to wean patient off of Levophed completely w/ MAP remaining >65. LDA's as in assessment and I/O flowsheets. Right abdominal wound dressing became saturated w/ green drainage, enough to soil lower right portion of gown and right side support pillow. Greenish-blue drainage in one of the right side JP's. Dressing changed, dated. Dr. Dahlia Byes was contacted and advised of this. He advised to monitor dressings and JP's and he would follow-up later today w/ the patient.

## 2016-07-31 NOTE — Anesthesia Postprocedure Evaluation (Signed)
Anesthesia Post Note  Patient: Lindsey Wolf  Procedure(s) Performed: Procedure(s) (LRB): EXPLORATORY LAPAROTOMY drainage peritoneal abscess (N/A) LYSIS OF ADHESION  Patient location during evaluation: ICU Anesthesia Type: General Level of consciousness: sedated Respiratory status: patient on ventilator - see flowsheet for VS Cardiovascular status: tachycardic and stable Anesthetic complications: no     Last Vitals:  Vitals:   07/31/16 0500 07/31/16 0600  BP: (!) 84/59 98/62  Pulse: 100 (!) 104  Resp: (!) 23 (!) 23  Temp:      Last Pain:  Vitals:   07/31/16 0400  TempSrc: Axillary  PainSc:                  Estill Batten

## 2016-08-01 ENCOUNTER — Encounter: Payer: Self-pay | Admitting: Anesthesiology

## 2016-08-01 ENCOUNTER — Inpatient Hospital Stay: Payer: BLUE CROSS/BLUE SHIELD

## 2016-08-01 ENCOUNTER — Encounter
Admission: EM | Disposition: A | Discharge status: Home Health | Payer: Self-pay | Source: Home / Self Care | Attending: Surgery

## 2016-08-01 LAB — BLOOD GAS, ARTERIAL
Acid-Base Excess: 6.2 mmol/L — ABNORMAL HIGH (ref 0.0–2.0)
Bicarbonate: 31.2 mmol/L — ABNORMAL HIGH (ref 20.0–28.0)
FIO2: 0.28
LHR: 23 {breaths}/min
MECHVT: 450 mL
O2 Saturation: 94.5 %
PEEP: 5 cmH2O
PO2 ART: 71 mmHg — AB (ref 83.0–108.0)
Patient temperature: 37
pCO2 arterial: 47 mmHg (ref 32.0–48.0)
pH, Arterial: 7.43 (ref 7.350–7.450)

## 2016-08-01 LAB — GLUCOSE, CAPILLARY
GLUCOSE-CAPILLARY: 120 mg/dL — AB (ref 65–99)
GLUCOSE-CAPILLARY: 125 mg/dL — AB (ref 65–99)
Glucose-Capillary: 129 mg/dL — ABNORMAL HIGH (ref 65–99)
Glucose-Capillary: 137 mg/dL — ABNORMAL HIGH (ref 65–99)
Glucose-Capillary: 88 mg/dL (ref 65–99)
Glucose-Capillary: 91 mg/dL (ref 65–99)

## 2016-08-01 LAB — BASIC METABOLIC PANEL
ANION GAP: 4 — AB (ref 5–15)
BUN: 22 mg/dL — ABNORMAL HIGH (ref 6–20)
CO2: 29 mmol/L (ref 22–32)
Calcium: 6.8 mg/dL — ABNORMAL LOW (ref 8.9–10.3)
Chloride: 98 mmol/L — ABNORMAL LOW (ref 101–111)
Creatinine, Ser: 0.46 mg/dL (ref 0.44–1.00)
GLUCOSE: 96 mg/dL (ref 65–99)
Potassium: 3.9 mmol/L (ref 3.5–5.1)
SODIUM: 131 mmol/L — AB (ref 135–145)

## 2016-08-01 LAB — CBC
HCT: 25.6 % — ABNORMAL LOW (ref 35.0–47.0)
Hemoglobin: 8.1 g/dL — ABNORMAL LOW (ref 12.0–16.0)
MCH: 23.6 pg — ABNORMAL LOW (ref 26.0–34.0)
MCHC: 31.7 g/dL — ABNORMAL LOW (ref 32.0–36.0)
MCV: 74.5 fL — ABNORMAL LOW (ref 80.0–100.0)
PLATELETS: 352 10*3/uL (ref 150–440)
RBC: 3.43 MIL/uL — ABNORMAL LOW (ref 3.80–5.20)
RDW: 25.4 % — AB (ref 11.5–14.5)
WBC: 28.3 10*3/uL — AB (ref 3.6–11.0)

## 2016-08-01 LAB — MAGNESIUM: MAGNESIUM: 1.8 mg/dL (ref 1.7–2.4)

## 2016-08-01 LAB — PROCALCITONIN: PROCALCITONIN: 0.84 ng/mL

## 2016-08-01 LAB — PHOSPHORUS: PHOSPHORUS: 3.5 mg/dL (ref 2.5–4.6)

## 2016-08-01 SURGERY — LAPAROTOMY, EXPLORATORY
Anesthesia: Choice

## 2016-08-01 MED ORDER — TRACE MINERALS CR-CU-MN-SE-ZN 10-1000-500-60 MCG/ML IV SOLN
INTRAVENOUS | Status: AC
  Administered 2016-08-01: 19:00:00 via INTRAVENOUS
  Filled 2016-08-01: qty 1992

## 2016-08-01 MED ORDER — IOPAMIDOL (ISOVUE-300) INJECTION 61%
100.0000 mL | Freq: Once | INTRAVENOUS | Status: AC | PRN
  Administered 2016-08-01: 100 mL via INTRAVENOUS

## 2016-08-01 NOTE — Progress Notes (Signed)
This patient has remained sedated (stable) and appeared to be resting well for the majority of this writer's shift. Fentanyl remains at 250 mcg/min throughout shift. Dilaudid given x 2 for breakthrough pain. Patient does follow commands, moves all extremities, however very stiff. Will pass on the oncoming RN to obtain PT consult order during AM rounds. Dressings changed. LRQ JP drain still draining bluish-green brown draining. Patient had a CT of the ABD w/ contrast at 0500 this AM. Levophed has been as high as 14 mcg/min and as low as 10 mcg/min keeping a MAP >65. TPN @ 83 mL/hr, LR @ 100 ml/hr, Amiodarone @ 30 mg/hr.

## 2016-08-01 NOTE — Progress Notes (Signed)
2 Days Post-Op   Subjective:  Patient remains in ICU. She is much more alert and communicative during evaluation. Remains intubated  Vital signs in last 24 hours: Temp:  [97.8 F (36.6 C)-99.7 F (37.6 C)] 98.6 F (37 C) (02/28 1600) Pulse Rate:  [85-106] 86 (02/28 1400) Resp:  [23-29] 23 (02/28 1400) BP: (90-135)/(54-71) 102/54 (02/28 1400) SpO2:  [91 %-97 %] 95 % (02/28 1508) Arterial Line BP: (67-125)/(51-71) 97/51 (02/28 0800) FiO2 (%):  [28 %] 28 % (02/28 1508) Weight:  [73.2 kg (161 lb 6 oz)] 73.2 kg (161 lb 6 oz) (02/28 0500) Last BM Date: 07/28/16  Intake/Output from previous day: 02/27 0701 - 02/28 0700 In: 4380 [I.V.:4130; IV Piggyback:250] Out: 2945 [Urine:1625; Emesis/NG output:30; Drains:440; Chest Tube:850]  GI: Abdomen is soft, mildly distended. Midline incision closed over Penrose drain with appropriate drainage from the Penrose drains. Otherwise well approximated without any evidence of erythema or purulence. JP drains in place 3. The right upper and left sided JP drain to the serous and was output. The right lower JP drain with decreasing blue colored fluid output. No current evidence of purulence.  Lab Results:  CBC  Recent Labs  07/31/16 0435 08/01/16 0600  WBC 34.4* 28.3*  HGB 9.4* 8.1*  HCT 28.9* 25.6*  PLT 366 352   CMP     Component Value Date/Time   NA 131 (L) 08/01/2016 0600   K 3.9 08/01/2016 0600   CL 98 (L) 08/01/2016 0600   CO2 29 08/01/2016 0600   GLUCOSE 96 08/01/2016 0600   BUN 22 (H) 08/01/2016 0600   CREATININE 0.46 08/01/2016 0600   CALCIUM 6.8 (L) 08/01/2016 0600   PROT 5.0 (L) 07/27/2016 0607   ALBUMIN 1.6 (L) 07/30/2016 0506   AST 33 07/27/2016 0607   ALT 16 07/27/2016 0607   ALKPHOS 138 (H) 07/27/2016 0607   BILITOT 0.5 07/27/2016 0607   GFRNONAA >60 08/01/2016 0600   GFRAA >60 08/01/2016 0600   PT/INR  Recent Labs  07/30/16 0410  LABPROT 17.0*  INR 1.37    Studies/Results: Dg Chest 1 View  Result Date:  08/01/2016 CLINICAL DATA:  Dyspnea EXAM: CHEST 1 VIEW COMPARISON:  07/31/2016 FINDINGS: Endotracheal tube, nasogastric catheter and right-sided PICC line are again seen and stable. Small bore left chest tube is again identified. Bilateral pleural effusions are again seen similar to those noted on the prior CT examination. Surgical drains are noted in the right upper quadrant. No pneumothorax is seen. The loculated component of the left pleural effusion is not well appreciated on this exam. IMPRESSION: Tubes and lines as described above. The overall appearance is stable from the prior study Electronically Signed   By: Inez Catalina M.D.   On: 08/01/2016 07:31   Dg Chest 1 View  Result Date: 07/31/2016 CLINICAL DATA:  Status post abdominal surgery for adhesions; laparotomy also on July 20, 2016 for perforated ulcer. Current smoker EXAM: CHEST 1 VIEW COMPARISON:  Portable chest x-ray of July 30, 2016 FINDINGS: The lungs are adequately inflated. Small bilateral pleural effusions are present greatest on the left. The interstitial markings are increased. Bibasilar densities persist. The heart is normal in size. The pulmonary vascularity is indistinct. The endotracheal tube tip projects 5.5 cm above the carina. The esophagogastric tube tip projects below the inferior margin of the image. The right-sided PICC line tip projects over the cavoatrial junction. The small caliber left-sided chest tube is in stable position with the tip projecting just above the left lateral  costophrenic gutter. Surgical drainage tubes are present within the upper abdomen. IMPRESSION: Slight interval improvement in the appearance of the chest with decreased pleural fluid volume on the left. No pneumothorax. Persistent bibasilar atelectasis or infiltrate. Mild central pulmonary vascular congestion. Electronically Signed   By: David  Martinique M.D.   On: 07/31/2016 07:08   Ct Abdomen Pelvis W Contrast  Result Date: 08/01/2016 CLINICAL  DATA:  History of complicated peptic ulcer disease repair with persistent intra-abdominal fluid collections EXAM: CT ABDOMEN AND PELVIS WITH CONTRAST TECHNIQUE: Multidetector CT imaging of the abdomen and pelvis was performed using the standard protocol following bolus administration of intravenous contrast. CONTRAST:  184mL ISOVUE-300 IOPAMIDOL (ISOVUE-300) INJECTION 61% COMPARISON:  07/27/2016 FINDINGS: Lower chest: Bibasilar atelectatic changes are noted stable from the prior exam. Bilateral pleural effusions are seen right greater than left which are slightly improved when compared with the prior exam. Small bore left chest tube is noted. There is a new loculated component of the left pleural effusion noted along the anterolateral cardiac margin. Hepatobiliary: Liver is again well visualized with a cyst within the medial segment of the left lobe of the liver stable from the prior exam. Gallbladder remains well distended. The previously seen perihepatic fluid collection has reduced significantly with removal of previously placed percutaneous drain and placement of a large bore surgical drain along the dome of the liver. Pancreas: Unremarkable. No pancreatic ductal dilatation or surrounding inflammatory changes. Spleen: The previously seen subcapsular fluid collection along the medial aspect of the spleen has reduced in size now measuring 6 cm in greatest dimension with reduction in air component within. The remaining spleen enhances in a normal fashion. Adrenals/Urinary Tract: Adrenal glands are unremarkable. Kidneys are normal, without renal calculi, focal lesion, or hydronephrosis. Bladder is well distended despite placement of a Foley catheter. Clinical correlation is recommended. Stomach/Bowel: Nasogastric catheter is noted within the stomach. The stomach is decompressed. The previously seen retrogastric lesser sac collection has reduced significantly with only a minimal amount of fluid adjacent to the tip of  surgically placed drain best seen on image number 31 of series 2. This measures approximately 2.4 cm in greatest dimension. A small collection is noted along the tip of the left lobe of the liver and extending along the anterior aspect of the stomach measuring approximately 7.4 by 2.0 cm. This collection was not present on the prior exam and possibly could be related to some retained fluid from the recent surgeries. The third surgically placed drain is noted along the left pericolic gutter and spleen. Contrast is noted within the collapsed colon. Vascular/Lymphatic: Aortic atherosclerosis. No enlarged abdominal or pelvic lymph nodes. Reproductive: Uterus and bilateral adnexa are unremarkable. Other: Minimal free pelvic fluid is noted which may be related to the recent surgery. A previously low seen bilobed collection noted just beneath the abdominal wall on the right anteriorly is again identified and has significantly reduced in size. It now measures approximately 7.6 cm by 2 cm. It previously measured 12 x 2.3 cm. A small collection in the left hemipelvis is again identified measuring 2.6 cm decreased in size from 3.1 cm. A Penrose drain is noted in the anterior abdominal wall in an area of prior subcutaneous fluid collection. The anterior abdominal wall changes consistent with the recent surgery are noted. Mild changes of anasarca are noted in the subcutaneous tissues. Musculoskeletal: No acute bony abnormality is noted. IMPRESSION: Postsurgical changes are noted with multiple tubes and drains in place. The overall appearance of the intra-abdominal  fluid collections has improved particularly in the region of the perihepatic fluid collection and subcapsular splenic fluid collection as well as the lesser sac fluid collection. Reduction in an a right anterior intra-abdominal collection is seen although a new small collection is noted along the tip of the left lobe of the liver and anterior aspect of the stomach as  described. It is possible this new small collection represent some retained fluid from the recent surgery. Bilateral pleural effusions which have improved from the prior exam right greater than left. A small bore left chest tube is noted in place. The left effusion demonstrates a somewhat more loculated component along the anteromedial margin of the mediastinum and heart. Nasogastric catheter remains in a predominately decompressed stomach no definitive perforation to account for the methylene blue passage into the surgical drain is noted although no contrast was administered within the stomach for this exam. Electronically Signed   By: Inez Catalina M.D.   On: 08/01/2016 07:30    Assessment/Plan: 51 year old female with a complicated hospital stay related to perforated peptic ulcers. Recent return to the operating room 2 days ago for drainage of intra-abdominal abscesses. Drain output that surgery methylene blue likely represents previously unseen posterior peptic ulcer. Patient was initially on the operative schedule for today however decision was made to cancel that procedure to allow the patient further time to improve clinically. Clinically she is much more responsive. Given the patient has a third area of perforation CT scan was obtained this morning which report is in this note. After discussing this case with multiple other providers I will test her gastrin level as well as her cortisol level looking for both reasons for the perforation and reasons for continued need of vasopressors. Appreciate critical care assistance with this, located patient. Depending on her clinical situation over the next 24-48 hours I may decide to attempt transfer to tertiary care center given her high level of complexity.   Clayburn Pert, MD [facility]  [facility] [phone] [facility] [phone]  08/01/2016

## 2016-08-01 NOTE — Progress Notes (Signed)
PHARMACY - ADULT TOTAL PARENTERAL NUTRITION CONSULT NOTE   Pharmacy Consult for TPN Indication: Perforated ulcer  Patient Measurements: Height: 5\' 10"  (177.8 cm) Weight: 161 lb 6 oz (73.2 kg) IBW/kg (Calculated) : 68.5 TPN AdjBW (KG): 58.8 Body mass index is 23.16 kg/m. Usual Weight:   Assessment: Pharmacy consulted to assist in the management of electrolytes and glucose in this 51 year old female being starting on parenteral nutrition.    GI:  Endo:  Insulin requirements in the past 24 hours: 1 units Lytes: All electrolytes wnl  Renal: Pulm: Cards:  Hepatobil: Neuro: ID:  Best Practices: TPN Access: TPN start date:  Nutritional Goals (per RD recommendation on ): kCal: Protein:   Current Nutrition:   Plan:  Will continue Clinimix E 5/15 to 83 ml/hr with Trace elements and MVI along with lipids 20% @ 20 ml/hr for 12 hours three times weekly on M/W/F per dietary's recommendations.  SSI ordered q4h checks.  Will recheck electrolytes with am labs.   Ulice Dash D Clinical Pharmacist 08/01/2016,3:15 PM

## 2016-08-01 NOTE — Progress Notes (Signed)
Lindsey Wolf Progess Note    ASSESSMENT/PLAN   51 yo female presented abdominal pain found to have perforated ulcers with multiple abdominal abscesses. S/p OR on 2/27.   PULMONARY A:Acute hypoxic respiratory failure.  Left loculated pleural effusion, likely reactive s/p left pigtail chest tube placement 2/26 Left chest tube output 850 yesterday.  P:   Await chest tube cultures.  Continue chest tube drainage.  Wean vent when more stable.   CARDIOVASCULAR A: Septic shock with hypotension.  Afib with RVR, started on amiodarone infusion 2/27, now improved.  P:  Levophed as needed for MAP>65.  --Replace tube losses with IVF.   RENAL A:  --  GASTROINTESTINAL/Surgical A:  Perforated ulcer s/p lap repair complicated by multiple abdominal abscesses.  P:   Continue protonix. Surgery follow, possibly to OR today.   HEMATOLOGIC A:  Leukocytosis is likely secondary, reactive.  P:  --  INFECTIOUS A:  Abdominal abscesses, s/p drainage.  P:     Micro/culture results:  BCx2 2/16; negative.  UC -- Sputum -- MRSA PCR 2/16; negative.  Pleural fluid culture 2/26; pending.   Procalcitonin 0.84  Antibiotics: Zosyn 2/17>>  ENDOCRINE A:  Glucose controlled. Continue SSI.   NEUROLOGIC A:  --Continue sedation.    MAJOR EVENTS/TEST RESULTS: 2/26; washout of abdominal abscess.  2/26; left chest tube placement.  2/21; IR placement of 12 Fr abscess drain.  2/16; Lap repair of perf gastric ulcers  Best Practices  DVT Prophylaxis: enoxaparin GI Prophylaxis: protonix.    ---------------------------------------   ----------------------------------------   Name: Lindsey Wolf MRN: [id number] DOB: 1965-07-28    ADMISSION DATE:  07/20/2016   SUBJECTIVE:   Pt currently on the ventilator, can not provide history or review of systems.   Review of Systems:  Could not obtain, on vent.    VITAL SIGNS: Temp:  [97.8 F (36.6 C)-99.7 F (37.6  C)] 97.8 F (36.6 C) (02/28 1200) Pulse Rate:  [85-106] 99 (02/28 1000) Resp:  [17-29] 23 (02/28 1000) BP: (89-135)/(54-71) 105/60 (02/28 1000) SpO2:  [91 %-96 %] 95 % (02/28 1100) Arterial Line BP: (67-125)/(51-71) 97/51 (02/28 0800) FiO2 (%):  [28 %] 28 % (02/28 1100) Weight:  [161 lb 6 oz (73.2 kg)] 161 lb 6 oz (73.2 kg) (02/28 0500) HEMODYNAMICS:   VENTILATOR SETTINGS: Vent Mode: AC FiO2 (%):  [28 %] 28 % Set Rate:  [23 bmp] 23 bmp Vt Set:  [450 mL] 450 mL PEEP:  [5 cmH20] 5 cmH20 Plateau Pressure:  [19 cmH20] 19 cmH20 INTAKE / OUTPUT:  Intake/Output Summary (Last 24 hours) at 08/01/16 1321 Last data filed at 08/01/16 1200  Gross per 24 hour  Intake          6022.02 ml  Output             3225 ml  Net          2797.02 ml    PHYSICAL EXAMINATION: Physical Examination:   VS: BP 105/60   Pulse 99   Temp 97.8 F (36.6 C) (Axillary)   Resp (!) 23   Ht 5\' 10"  (1.778 m)   Wt 161 lb 6 oz (73.2 kg)   SpO2 95%   BMI 23.16 kg/m   General Appearance: No distress  Neuro:without focal findings, mental status reduced.  HEENT: PERRLA, EOM intact. Pulmonary: normal breath sounds.  CardiovascularNormal S1,S2.  No m/r/g.   Abdomen: Benign, Soft, non-tender. Renal:  No costovertebral tenderness  GU:  Not performed at this time.  Endocrine: No evident thyromegaly. Skin:   warm, no rashes, no ecchymosis  Extremities: normal, no cyanosis, clubbing.   LABS:   LABORATORY PANEL:   CBC  Recent Labs Lab 08/01/16 0600  WBC 28.3*  HGB 8.1*  HCT 25.6*  PLT 352    Chemistries   Recent Labs Lab 07/27/16 0607  08/01/16 0600  NA 134*  < > 131*  K 3.1*  < > 3.9  CL 105  < > 98*  CO2 25  < > 29  GLUCOSE 120*  < > 96  BUN 7  < > 22*  CREATININE 0.33*  < > 0.46  CALCIUM 7.3*  < > 6.8*  MG 1.7  < > 1.8  PHOS  --   < > 3.5  AST 33  --   --   ALT 16  --   --   ALKPHOS 138*  --   --   BILITOT 0.5  --   --   < > = values in this interval not displayed.   Recent  Labs Lab 07/31/16 1550 07/31/16 1954 07/31/16 2342 08/01/16 0336 08/01/16 0743 08/01/16 1132  GLUCAP 113* 118* 126* 120* 88 91    Recent Labs Lab 07/30/16 0530 07/30/16 0635 08/01/16 0440  PHART 7.28* 7.35 7.43  PCO2ART 48 40 47  PO2ART 117* 142* 71*    Recent Labs Lab 07/26/16 0518 07/27/16 0607 07/29/16 0501 07/30/16 0506  AST 25 33  --   --   ALT 14 16  --   --   ALKPHOS 113 138*  --   --   BILITOT 0.7 0.5  --   --   ALBUMIN 1.7* 1.8* 1.7* 1.6*    Cardiac Enzymes No results for input(s): TROPONINI in the last 168 hours.  RADIOLOGY:  Dg Chest 1 View  Result Date: 08/01/2016 CLINICAL DATA:  Dyspnea EXAM: CHEST 1 VIEW COMPARISON:  07/31/2016 FINDINGS: Endotracheal tube, nasogastric catheter and right-sided PICC line are again seen and stable. Small bore left chest tube is again identified. Bilateral pleural effusions are again seen similar to those noted on the prior CT examination. Surgical drains are noted in the right upper quadrant. No pneumothorax is seen. The loculated component of the left pleural effusion is not well appreciated on this exam. IMPRESSION: Tubes and lines as described above. The overall appearance is stable from the prior study Electronically Signed   By: Inez Catalina M.D.   On: 08/01/2016 07:31   Dg Chest 1 View  Result Date: 07/31/2016 CLINICAL DATA:  Status post abdominal surgery for adhesions; laparotomy also on July 20, 2016 for perforated ulcer. Current smoker EXAM: CHEST 1 VIEW COMPARISON:  Portable chest x-ray of July 30, 2016 FINDINGS: The lungs are adequately inflated. Small bilateral pleural effusions are present greatest on the left. The interstitial markings are increased. Bibasilar densities persist. The heart is normal in size. The pulmonary vascularity is indistinct. The endotracheal tube tip projects 5.5 cm above the carina. The esophagogastric tube tip projects below the inferior margin of the image. The right-sided PICC  line tip projects over the cavoatrial junction. The small caliber left-sided chest tube is in stable position with the tip projecting just above the left lateral costophrenic gutter. Surgical drainage tubes are present within the upper abdomen. IMPRESSION: Slight interval improvement in the appearance of the chest with decreased pleural fluid volume on the left. No pneumothorax. Persistent bibasilar atelectasis or infiltrate. Mild central pulmonary vascular congestion. Electronically Signed   By: Shanon Brow  Martinique M.D.   On: 07/31/2016 07:08   Ct Abdomen Pelvis W Contrast  Result Date: 08/01/2016 CLINICAL DATA:  History of complicated peptic ulcer disease repair with persistent intra-abdominal fluid collections EXAM: CT ABDOMEN AND PELVIS WITH CONTRAST TECHNIQUE: Multidetector CT imaging of the abdomen and pelvis was performed using the standard protocol following bolus administration of intravenous contrast. CONTRAST:  164mL ISOVUE-300 IOPAMIDOL (ISOVUE-300) INJECTION 61% COMPARISON:  07/27/2016 FINDINGS: Lower chest: Bibasilar atelectatic changes are noted stable from the prior exam. Bilateral pleural effusions are seen right greater than left which are slightly improved when compared with the prior exam. Small bore left chest tube is noted. There is a new loculated component of the left pleural effusion noted along the anterolateral cardiac margin. Hepatobiliary: Liver is again well visualized with a cyst within the medial segment of the left lobe of the liver stable from the prior exam. Gallbladder remains well distended. The previously seen perihepatic fluid collection has reduced significantly with removal of previously placed percutaneous drain and placement of a large bore surgical drain along the dome of the liver. Pancreas: Unremarkable. No pancreatic ductal dilatation or surrounding inflammatory changes. Spleen: The previously seen subcapsular fluid collection along the medial aspect of the spleen has  reduced in size now measuring 6 cm in greatest dimension with reduction in air component within. The remaining spleen enhances in a normal fashion. Adrenals/Urinary Tract: Adrenal glands are unremarkable. Kidneys are normal, without renal calculi, focal lesion, or hydronephrosis. Bladder is well distended despite placement of a Foley catheter. Clinical correlation is recommended. Stomach/Bowel: Nasogastric catheter is noted within the stomach. The stomach is decompressed. The previously seen retrogastric lesser sac collection has reduced significantly with only a minimal amount of fluid adjacent to the tip of surgically placed drain best seen on image number 31 of series 2. This measures approximately 2.4 cm in greatest dimension. A small collection is noted along the tip of the left lobe of the liver and extending along the anterior aspect of the stomach measuring approximately 7.4 by 2.0 cm. This collection was not present on the prior exam and possibly could be related to some retained fluid from the recent surgeries. The third surgically placed drain is noted along the left pericolic gutter and spleen. Contrast is noted within the collapsed colon. Vascular/Lymphatic: Aortic atherosclerosis. No enlarged abdominal or pelvic lymph nodes. Reproductive: Uterus and bilateral adnexa are unremarkable. Other: Minimal free pelvic fluid is noted which may be related to the recent surgery. A previously low seen bilobed collection noted just beneath the abdominal wall on the right anteriorly is again identified and has significantly reduced in size. It now measures approximately 7.6 cm by 2 cm. It previously measured 12 x 2.3 cm. A small collection in the left hemipelvis is again identified measuring 2.6 cm decreased in size from 3.1 cm. A Penrose drain is noted in the anterior abdominal wall in an area of prior subcutaneous fluid collection. The anterior abdominal wall changes consistent with the recent surgery are noted.  Mild changes of anasarca are noted in the subcutaneous tissues. Musculoskeletal: No acute bony abnormality is noted. IMPRESSION: Postsurgical changes are noted with multiple tubes and drains in place. The overall appearance of the intra-abdominal fluid collections has improved particularly in the region of the perihepatic fluid collection and subcapsular splenic fluid collection as well as the lesser sac fluid collection. Reduction in an a right anterior intra-abdominal collection is seen although a new small collection is noted along the tip of the  left lobe of the liver and anterior aspect of the stomach as described. It is possible this new small collection represent some retained fluid from the recent surgery. Bilateral pleural effusions which have improved from the prior exam right greater than left. A small bore left chest tube is noted in place. The left effusion demonstrates a somewhat more loculated component along the anteromedial margin of the mediastinum and heart. Nasogastric catheter remains in a predominately decompressed stomach no definitive perforation to account for the methylene blue passage into the surgical drain is noted although no contrast was administered within the stomach for this exam. Electronically Signed   By: Inez Catalina M.D.   On: 08/01/2016 07:30       --Marda Stalker, MD.  ICU Pager: [phone] [facility] Office Number: [phone]  Patricia Pesa, M.D.  Vilinda Boehringer, M.D.  Merton Border, M.D  08/01/2016   Critical Care Attestation.  I have personally obtained a history, examined the patient, evaluated laboratory and imaging results, formulated the assessment and plan and placed orders. The Patient requires high complexity decision making for assessment and support, frequent evaluation and titration of therapies, application of advanced monitoring technologies and extensive interpretation of multiple databases. The patient has  critical illness that could lead imminently to failure of 1 or more organ systems and requires the highest level of physician preparedness to intervene.  Critical Care Time devoted to patient care services described in this note is 37 minutes and is exclusive of time spent in procedures.

## 2016-08-02 ENCOUNTER — Inpatient Hospital Stay: Payer: BLUE CROSS/BLUE SHIELD

## 2016-08-02 LAB — CBC
HCT: 26.2 % — ABNORMAL LOW (ref 35.0–47.0)
HEMOGLOBIN: 8.2 g/dL — AB (ref 12.0–16.0)
MCH: 23.3 pg — AB (ref 26.0–34.0)
MCHC: 31.3 g/dL — ABNORMAL LOW (ref 32.0–36.0)
MCV: 74.5 fL — AB (ref 80.0–100.0)
Platelets: 385 10*3/uL (ref 150–440)
RBC: 3.52 MIL/uL — AB (ref 3.80–5.20)
RDW: 25.3 % — ABNORMAL HIGH (ref 11.5–14.5)
WBC: 28.3 10*3/uL — ABNORMAL HIGH (ref 3.6–11.0)

## 2016-08-02 LAB — GLUCOSE, CAPILLARY
GLUCOSE-CAPILLARY: 102 mg/dL — AB (ref 65–99)
GLUCOSE-CAPILLARY: 107 mg/dL — AB (ref 65–99)
GLUCOSE-CAPILLARY: 116 mg/dL — AB (ref 65–99)
GLUCOSE-CAPILLARY: 125 mg/dL — AB (ref 65–99)
GLUCOSE-CAPILLARY: 137 mg/dL — AB (ref 65–99)
Glucose-Capillary: 106 mg/dL — ABNORMAL HIGH (ref 65–99)

## 2016-08-02 LAB — BLOOD GAS, ARTERIAL
ACID-BASE EXCESS: 6.7 mmol/L — AB (ref 0.0–2.0)
Bicarbonate: 32.3 mmol/L — ABNORMAL HIGH (ref 20.0–28.0)
FIO2: 0.28
O2 SAT: 92.3 %
PEEP/CPAP: 5 cmH2O
PH ART: 7.41 (ref 7.350–7.450)
Patient temperature: 37
Pressure support: 5 cmH2O
pCO2 arterial: 51 mmHg — ABNORMAL HIGH (ref 32.0–48.0)
pO2, Arterial: 64 mmHg — ABNORMAL LOW (ref 83.0–108.0)

## 2016-08-02 LAB — BASIC METABOLIC PANEL
Anion gap: 6 (ref 5–15)
BUN: 14 mg/dL (ref 6–20)
CHLORIDE: 96 mmol/L — AB (ref 101–111)
CO2: 30 mmol/L (ref 22–32)
Calcium: 7.1 mg/dL — ABNORMAL LOW (ref 8.9–10.3)
Creatinine, Ser: 0.31 mg/dL — ABNORMAL LOW (ref 0.44–1.00)
GFR calc Af Amer: >60 mL/min (ref >60)
GFR calc non Af Amer: >60 mL/min (ref >60)
GLUCOSE: 137 mg/dL — AB (ref 65–99)
Potassium: 3.4 mmol/L — ABNORMAL LOW (ref 3.5–5.1)
Sodium: 132 mmol/L — ABNORMAL LOW (ref 135–145)

## 2016-08-02 LAB — CORTISOL-AM, BLOOD: Cortisol - AM: 21 ug/dL (ref 6.7–22.6)

## 2016-08-02 LAB — ACID FAST SMEAR (AFB, MYCOBACTERIA)

## 2016-08-02 LAB — ACID FAST SMEAR (AFB): ACID FAST SMEAR - AFSCU2: NEGATIVE

## 2016-08-02 LAB — MAGNESIUM: MAGNESIUM: 1.7 mg/dL (ref 1.7–2.4)

## 2016-08-02 LAB — TRIGLYCERIDES: Triglycerides: 179 mg/dL — ABNORMAL HIGH (ref <150)

## 2016-08-02 MED ORDER — MAGNESIUM SULFATE 2 GM/50ML IV SOLN
2.0000 g | Freq: Once | INTRAVENOUS | Status: AC
  Administered 2016-08-02: 2 g via INTRAVENOUS
  Filled 2016-08-02: qty 50

## 2016-08-02 MED ORDER — TRACE MINERALS CR-CU-MN-SE-ZN 10-1000-500-60 MCG/ML IV SOLN
INTRAVENOUS | Status: AC
  Administered 2016-08-02: 18:00:00 via INTRAVENOUS
  Filled 2016-08-02: qty 1992

## 2016-08-02 MED ORDER — SODIUM CHLORIDE 0.9 % IV SOLN
30.0000 meq | Freq: Once | INTRAVENOUS | Status: AC
  Administered 2016-08-02: 30 meq via INTRAVENOUS
  Filled 2016-08-02: qty 15

## 2016-08-02 MED ORDER — FAT EMULSION 20 % IV EMUL
250.0000 mL | INTRAVENOUS | Status: AC
  Administered 2016-08-02: 250 mL via INTRAVENOUS
  Filled 2016-08-02: qty 250

## 2016-08-02 MED ORDER — PHENOL 1.4 % MT LIQD
1.0000 | OROMUCOSAL | Status: DC | PRN
  Administered 2016-08-02: 1 via OROMUCOSAL
  Filled 2016-08-02: qty 177

## 2016-08-02 NOTE — Evaluation (Signed)
Physical Therapy Evaluation Patient Details Name: Lindsey Wolf MRN: [id number] DOB: 10/19/1965 Today's Date: 08/02/2016   History of Present Illness  Pt is a 51 year old female status post complicated repair of peptic ulcer disease. She later underwent a repeat laparotomy for drainage of intra-abdominal abscesses. Pt diagnosed with Acute hypoxic respiratory failure with L chest tube placement, septic shock with hypotension, and A-fib with RVR.  Pt also has multiple JP drains and was intubated but is now extubated.     Clinical Impression  Pt presents with deficits in strength, transfers, mobility, gait, balance, and activity tolerance.  Per nursing session was limited to supine activities only with emphasis on PROM to extremities.  Pt is clearly very weak but was able to follow commands and did participate minimally with ROM activities.  Most notable deficits were in L shoulder abduction and R hip abd.  During session L shoulder abd improved from around 100 deg to 150 deg grossly.  R hip abduction remained limited with facial grimacing at 15-20 deg.  Pt will benefit from PT services to address above deficits for decreased caregiver assistance upon discharge.      Follow Up Recommendations SNF    Equipment Recommendations  None recommended by PT    Recommendations for Other Services       Precautions / Restrictions Precautions Precautions: Fall Restrictions Weight Bearing Restrictions: No      Mobility  Bed Mobility Overal bed mobility: Needs Assistance Bed Mobility: Rolling Rolling: +2 for physical assistance;Mod assist         General bed mobility comments: PT assisted with nursing for use of bedpan with +2 Mod A.  Transfers                 General transfer comment: Deferred per nursing request  Ambulation/Gait             General Gait Details: Deferred per nursing request  Stairs            Wheelchair Mobility    Modified Rankin (Stroke  Patients Only)       Balance                                             Pertinent Vitals/Pain Pain Assessment: No/denies pain    Home Living Family/patient expects to be discharged to:: Private residence (History obtained by calling pt's mother with pt's consent secondary to pt having difficulty speaking.) Living Arrangements: Parent Available Help at Discharge: Family;Other (Comment) (Pt lives with mother but 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, unsure of available help at discharge.) Type of Home: Mobile home Home Access: Ramped entrance     Home Layout: One level Home Equipment: Walker - 2 wheels;Walker - 4 wheels;Cane - quad;Wheelchair - manual      Prior Function Level of Independence: Independent         Comments: Pt works FT, Ind with amb without AD, Ind with ADLs, 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     Hand Dominance        Extremity/Trunk Assessment   Upper Extremity Assessment Upper Extremity Assessment: Generalized weakness    Lower Extremity Assessment Lower Extremity Assessment: Generalized weakness       Communication   Communication: Other (comment) (Recently extubated, difficulty speaking)  Cognition Arousal/Alertness: Awake/alert Behavior During Therapy: WFL for tasks assessed/performed Overall Cognitive Status: Within Functional Limits for tasks assessed  General Comments      Exercises Total Joint Exercises Ankle Circles/Pumps: AAROM;Both;10 reps;15 reps Short Arc Quad: PROM;Both;10 reps Heel Slides: PROM;Both;10 reps;15 reps Hip ABduction/ADduction: PROM;Both;10 reps;15 reps Straight Leg Raises: PROM;Both;10 reps General Exercises - Upper Extremity Shoulder Flexion: PROM;Both;10 reps;15 reps Shoulder ABduction: PROM;Both;10 reps;15 reps Elbow Flexion: PROM;Both;10 reps Elbow Extension: PROM;Both;10 reps Wrist Flexion: PROM;Both;10 reps Wrist Extension: PROM;Both;10 reps Digit Composite Flexion:  AROM;Both;5 reps Composite Extension: AROM;Both;5 reps   Assessment/Plan    PT Assessment Patient needs continued PT services  PT Problem List Decreased strength;Decreased range of motion;Decreased activity tolerance;Decreased mobility       PT Treatment Interventions      PT Goals (Current goals can be found in the Care Plan section)  Acute Rehab PT Goals PT Goal Formulation: Patient unable to participate in goal setting    Frequency Min 2X/week   Barriers to discharge        Co-evaluation               End of Session Equipment Utilized During Treatment: Oxygen Activity Tolerance: Patient limited by fatigue Patient left: in bed;with call bell/phone within reach;with nursing/sitter in room   PT Visit Diagnosis: Muscle weakness (generalized) (M62.81);Difficulty in walking, not elsewhere classified (R26.2)         Time: [id number] PT Time Calculation (min) (ACUTE ONLY): 25 min   Charges:   PT Evaluation $PT Eval Moderate Complexity: 1 Procedure PT Treatments $Therapeutic Exercise: 8-22 mins   PT G Codes:         DRoyetta Asal PT, DPT 08/02/16, 1:09 PM

## 2016-08-02 NOTE — Progress Notes (Signed)
Patient extubated and placed on [place] 4lpm . Patient tolerating well, saturations 94%, no adverse reactions noted.

## 2016-08-02 NOTE — Progress Notes (Signed)
3 Days Post-Op   Subjective:  Patient remains in ICU. Was able be weaned off of vasopressors and extubated earlier today. Only complaint is of tenderness and nausea  Vital signs in last 24 hours: Temp:  [98.6 F (37 C)-99.1 F (37.3 C)] 98.6 F (37 C) (03/01 1200) Pulse Rate:  [76-111] 105 (03/01 1234) Resp:  [12-23] 16 (03/01 1200) BP: (98-154)/(49-134) 114/60 (03/01 1200) SpO2:  [79 %-97 %] 95 % (03/01 1234) Arterial Line BP: (87-187)/(39-80) 131/54 (03/01 1200) FiO2 (%):  [28 %] 28 % (03/01 0930) Weight:  [72.5 kg (159 lb 13.3 oz)] 72.5 kg (159 lb 13.3 oz) (03/01 0509) Last BM Date: 07/28/16  Intake/Output from previous day: 02/28 0701 - 03/01 0700 In: 4594.9 [I.V.:4494.9; IV Piggyback:100] Out: 2400 [Urine:1830; Emesis/NG output:40; Drains:320; Chest Tube:210]  GI: Abdomen is minimally distended and appropriately tender to palpation. Midline staple line intact with Penrose drain. Some seropurulent drainage from the Penrose. No spreading erythema from the staples. JP drains in place. The right upper quadrant and left-sided drains with decreasing serous and was output. The lower right sided drain continues to have discolored output but it too is decreasing in amounts.  Lab Results:  CBC  Recent Labs  08/01/16 0600 08/02/16 0515  WBC 28.3* 28.3*  HGB 8.1* 8.2*  HCT 25.6* 26.2*  PLT 352 385   CMP     Component Value Date/Time   NA 132 (L) 08/02/2016 0515   K 3.4 (L) 08/02/2016 0515   CL 96 (L) 08/02/2016 0515   CO2 30 08/02/2016 0515   GLUCOSE 137 (H) 08/02/2016 0515   BUN 14 08/02/2016 0515   CREATININE 0.31 (L) 08/02/2016 0515   CALCIUM 7.1 (L) 08/02/2016 0515   PROT 5.0 (L) 07/27/2016 0607   ALBUMIN 1.6 (L) 07/30/2016 0506   AST 33 07/27/2016 0607   ALT 16 07/27/2016 0607   ALKPHOS 138 (H) 07/27/2016 0607   BILITOT 0.5 07/27/2016 0607   GFRNONAA >60 08/02/2016 0515   GFRAA >60 08/02/2016 0515   PT/INR No results for input(s): LABPROT, INR in the last 72  hours.  Studies/Results: Dg Chest 1 View  Result Date: 08/02/2016 CLINICAL DATA:  Acute onset of dyspnea.  Initial encounter. EXAM: CHEST 1 VIEW COMPARISON:  Chest radiograph performed 08/01/2016 FINDINGS: The patient's endotracheal tube is seen ending 4-5 cm above the carina. A right PICC is noted ending about the cavoatrial junction. The lungs are well-aerated. Small to moderate bilateral pleural effusions are noted. Patchy bilateral airspace opacities may reflect pulmonary edema or pneumonia, perhaps slightly worsened from the prior study. No pneumothorax is seen. The cardiomediastinal silhouette is borderline normal in size. No acute osseous abnormalities are seen. Drains are noted overlying the upper quadrants. IMPRESSION: 1. Endotracheal tube seen ending 4-5 cm above the carina. 2. Small bilateral pleural effusions noted. Patchy bilateral airspace opacification may reflect pulmonary edema or pneumonia, perhaps slightly worsened from the prior study. Electronically Signed   By: Garald Balding M.D.   On: 08/02/2016 04:00   Dg Chest 1 View  Result Date: 08/01/2016 CLINICAL DATA:  Dyspnea EXAM: CHEST 1 VIEW COMPARISON:  07/31/2016 FINDINGS: Endotracheal tube, nasogastric catheter and right-sided PICC line are again seen and stable. Small bore left chest tube is again identified. Bilateral pleural effusions are again seen similar to those noted on the prior CT examination. Surgical drains are noted in the right upper quadrant. No pneumothorax is seen. The loculated component of the left pleural effusion is not well appreciated on this  exam. IMPRESSION: Tubes and lines as described above. The overall appearance is stable from the prior study Electronically Signed   By: Inez Catalina M.D.   On: 08/01/2016 07:31   Ct Abdomen Pelvis W Contrast  Result Date: 08/01/2016 CLINICAL DATA:  History of complicated peptic ulcer disease repair with persistent intra-abdominal fluid collections EXAM: CT ABDOMEN AND  PELVIS WITH CONTRAST TECHNIQUE: Multidetector CT imaging of the abdomen and pelvis was performed using the standard protocol following bolus administration of intravenous contrast. CONTRAST:  163mL ISOVUE-300 IOPAMIDOL (ISOVUE-300) INJECTION 61% COMPARISON:  07/27/2016 FINDINGS: Lower chest: Bibasilar atelectatic changes are noted stable from the prior exam. Bilateral pleural effusions are seen right greater than left which are slightly improved when compared with the prior exam. Small bore left chest tube is noted. There is a new loculated component of the left pleural effusion noted along the anterolateral cardiac margin. Hepatobiliary: Liver is again well visualized with a cyst within the medial segment of the left lobe of the liver stable from the prior exam. Gallbladder remains well distended. The previously seen perihepatic fluid collection has reduced significantly with removal of previously placed percutaneous drain and placement of a large bore surgical drain along the dome of the liver. Pancreas: Unremarkable. No pancreatic ductal dilatation or surrounding inflammatory changes. Spleen: The previously seen subcapsular fluid collection along the medial aspect of the spleen has reduced in size now measuring 6 cm in greatest dimension with reduction in air component within. The remaining spleen enhances in a normal fashion. Adrenals/Urinary Tract: Adrenal glands are unremarkable. Kidneys are normal, without renal calculi, focal lesion, or hydronephrosis. Bladder is well distended despite placement of a Foley catheter. Clinical correlation is recommended. Stomach/Bowel: Nasogastric catheter is noted within the stomach. The stomach is decompressed. The previously seen retrogastric lesser sac collection has reduced significantly with only a minimal amount of fluid adjacent to the tip of surgically placed drain best seen on image number 31 of series 2. This measures approximately 2.4 cm in greatest dimension. A  small collection is noted along the tip of the left lobe of the liver and extending along the anterior aspect of the stomach measuring approximately 7.4 by 2.0 cm. This collection was not present on the prior exam and possibly could be related to some retained fluid from the recent surgeries. The third surgically placed drain is noted along the left pericolic gutter and spleen. Contrast is noted within the collapsed colon. Vascular/Lymphatic: Aortic atherosclerosis. No enlarged abdominal or pelvic lymph nodes. Reproductive: Uterus and bilateral adnexa are unremarkable. Other: Minimal free pelvic fluid is noted which may be related to the recent surgery. A previously low seen bilobed collection noted just beneath the abdominal wall on the right anteriorly is again identified and has significantly reduced in size. It now measures approximately 7.6 cm by 2 cm. It previously measured 12 x 2.3 cm. A small collection in the left hemipelvis is again identified measuring 2.6 cm decreased in size from 3.1 cm. A Penrose drain is noted in the anterior abdominal wall in an area of prior subcutaneous fluid collection. The anterior abdominal wall changes consistent with the recent surgery are noted. Mild changes of anasarca are noted in the subcutaneous tissues. Musculoskeletal: No acute bony abnormality is noted. IMPRESSION: Postsurgical changes are noted with multiple tubes and drains in place. The overall appearance of the intra-abdominal fluid collections has improved particularly in the region of the perihepatic fluid collection and subcapsular splenic fluid collection as well as the lesser sac  fluid collection. Reduction in an a right anterior intra-abdominal collection is seen although a new small collection is noted along the tip of the left lobe of the liver and anterior aspect of the stomach as described. It is possible this new small collection represent some retained fluid from the recent surgery. Bilateral pleural  effusions which have improved from the prior exam right greater than left. A small bore left chest tube is noted in place. The left effusion demonstrates a somewhat more loculated component along the anteromedial margin of the mediastinum and heart. Nasogastric catheter remains in a predominately decompressed stomach no definitive perforation to account for the methylene blue passage into the surgical drain is noted although no contrast was administered within the stomach for this exam. Electronically Signed   By: Inez Catalina M.D.   On: 08/01/2016 07:30    Assessment/Plan: 51 year old female with multiple perforated ulcers. Clinically improving. Continues to have evidence of posterior perforation that is contained and controlled by the lower right sided JP drain. Appreciate critical care assistance with this, the patient. Encourage incentive spirometer. She is to remain strict nothing by mouth as she has evidence of a gastric leak. Continue antibiotics and antifungals.   Clayburn Pert, MD [facility]  [facility] [phone] Night [facility] [phone]  08/02/2016

## 2016-08-02 NOTE — Progress Notes (Signed)
This patient has remained stable throughout this writer's shift. Levophed has been as high as 10 mcg/min and as low as 6 mcg/min in accordance w/ MAP goal of >65. She was given Dilaudid x 2 for pain w/ noted relief.RLQ JP drain still putting out dark fluid that has a bluish-green tint and obvious puss in it. Chest tube - see flowsheet. Pink puss-like drainage coming from the bottom portion of the midline incision honeycomb dressing. During shift, patient watches tv, attempting to talk around the tube, follows commands, attempted to write (was illegible), and attempted to use communication board. She is clearly more responsive and alert than she has been the past 2 consecutive shifts that I have had her. RLQ JP drainage from the shift saved to show on-call surgeon. Will enter PT consult for today. Report given to Merleen Nicely, Therapist, sports.

## 2016-08-02 NOTE — Progress Notes (Signed)
Nutrition Follow-up  DOCUMENTATION CODES:   Not applicable  INTERVENTION:  -Nutritional needs reassessed. Recommend continuing 5%AA/15%Dextrose TPN at rate of 83 ml/hr, resume 20% ILE at rate of 20 ml/hr for 12 hours daily. Continue to assess  NUTRITION DIAGNOSIS:   Inadequate oral intake related to inability to eat as evidenced by NPO status.  Being addressed via TPN  GOAL:   Patient will meet greater than or equal to 90% of their needs  MONITOR:   I & O's, Diet advancement, Labs, Weight trends, Supplement acceptance  REASON FOR ASSESSMENT:   Malnutrition Screening Tool    ASSESSMENT:   Pt extubated to 4L [place] this AM, off pressors, sore throat post extubation TPN 5%AA/15%Dextrose infusing at rate of 83 ml/hr, 20% Lipids 3x weekly (MWF) Labs: sodium 132, potassium 3.4 Meds: reviewed  Diet Order:  Diet NPO time specified Except for: Ice Chips .TPN (CLINIMIX-E) Adult  Skin:  Reviewed, no issues  Last BM:  PTA  Height:   Ht Readings from Last 1 Encounters:  07/20/16 5\' 10"  (1.778 m)    Weight:   Wt Readings from Last 1 Encounters:  08/02/16 159 lb 13.3 oz (72.5 kg)    Ideal Body Weight:  68.18 kg  BMI:  Body mass index is 22.93 kg/m.  Estimated Nutritional Needs:   Kcal:  1825-2175 kcals  Protein:  102-136 g  Fluid:  >/= 1.8 L  EDUCATION NEEDS:   No education needs identified at this time  [place], [place], [place] [phone] Pager  [phone] Weekend/On-Call Pager

## 2016-08-02 NOTE — Progress Notes (Signed)
[facility] Progess Note    ASSESSMENT/PLAN   51 yo female presented abdominal pain found to have perforated ulcers with multiple abdominal abscesses complicated by severe septic shock. S/p OR on 2/27.   PULMONARY A:Acute hypoxic respiratory failure- vent dependent.   Left loculated pleural effusion, likely reactive s/p left pigtail chest tube placement 2/26 Left chest tube output 210 yesterday.  P:   Continue to Await chest tube cultures   Continue chest tube drainage, decreasing.  Wean vent today, will attempt weaning trial and extubate if tolerated.   CARDIOVASCULAR A: Septic shock with hypotension.  Afib with RVR, started on amiodarone infusion 2/27, now improved.  P:  Levophed as needed for MAP>65.  --Replace tube losses with IVF.   RENAL A:  --  GASTROINTESTINAL/Surgical A:  Perforated ulcer s/p lap repair complicated by multiple abdominal abscesses.  P:   Continue protonix. Surgery follow, possibly to OR today.   HEMATOLOGIC A:  Leukocytosis is likely secondary, reactive.  P:  --  INFECTIOUS A:  Abdominal abscesses, s/p drainage.  P:     Micro/culture results:  BCx2 2/16; negative.  UC -- Sputum -- MRSA PCR 2/16; negative.  Pleural fluid culture 2/26; pending.   Procalcitonin 2.14>>0.84  Antibiotics: Zosyn 2/17>> plan on 2 week course.   ENDOCRINE A:  Glucose controlled. Continue SSI.   NEUROLOGIC A:  --Continue sedation.    MAJOR EVENTS/TEST RESULTS: 2/26; washout of abdominal abscess.  2/26; left chest tube placement.  2/21; IR placement of 12 Fr abscess drain.  2/16; Lap repair of perf gastric ulcers  Best Practices  DVT Prophylaxis: enoxaparin GI Prophylaxis: protonix.    ---------------------------------------   ----------------------------------------   Name: Lindsey Wolf MRN: [id number] DOB: July 08, 1965    ADMISSION DATE:  07/20/2016   SUBJECTIVE:   Pt currently on the ventilator, can not  provide history or review of systems.   Review of Systems:  Could not obtain, on vent.    VITAL SIGNS: Temp:  [97.8 F (36.6 C)-99.1 F (37.3 C)] 99.1 F (37.3 C) (03/01 0730) Pulse Rate:  [76-109] 98 (03/01 0825) Resp:  [12-23] 23 (03/01 0825) BP: (98-154)/(49-134) 125/67 (03/01 0730) SpO2:  [79 %-97 %] 96 % (03/01 0825) Arterial Line BP: (87-187)/(39-80) 121/51 (03/01 0825) FiO2 (%):  [28 %] 28 % (03/01 0825) Weight:  [159 lb 13.3 oz (72.5 kg)] 159 lb 13.3 oz (72.5 kg) (03/01 0509) HEMODYNAMICS:   VENTILATOR SETTINGS: Vent Mode: PRVC FiO2 (%):  [28 %] 28 % Set Rate:  [23 bmp] 23 bmp Vt Set:  [450 mL] 450 mL PEEP:  [5 cmH20-6 cmH20] 5 cmH20 Plateau Pressure:  [18 cmH20-20 cmH20] 20 cmH20 INTAKE / OUTPUT:  Intake/Output Summary (Last 24 hours) at 08/02/16 0836 Last data filed at 08/02/16 0825  Gross per 24 hour  Intake          4594.92 ml  Output             2280 ml  Net          2314.92 ml    PHYSICAL EXAMINATION: Physical Examination:   VS: BP 125/67   Pulse 98   Temp 99.1 F (37.3 C) (Oral)   Resp (!) 23   Ht 5\' 10"  (1.778 m)   Wt 159 lb 13.3 oz (72.5 kg)   SpO2 96%   BMI 22.93 kg/m   General Appearance: No distress  Neuro:without focal findings, pt awake and alert, follows commands.  HEENT: PERRLA, EOM intact.  Pulmonary: normal breath sounds. Decreased bilaterally.  CardiovascularNormal S1,S2.  No m/r/g.   Abdomen: Benign, Soft, non-tender. Renal:  No costovertebral tenderness  GU:  Not performed at this time. Endocrine: No evident thyromegaly. Skin:   warm, no rashes, no ecchymosis  Extremities: normal, no cyanosis, clubbing.   LABS:   LABORATORY PANEL:   CBC  Recent Labs Lab 08/02/16 0515  WBC 28.3*  HGB 8.2*  HCT 26.2*  PLT 385    Chemistries   Recent Labs Lab 07/27/16 0607  08/01/16 0600 08/02/16 0515  NA 134*  < > 131* 132*  K 3.1*  < > 3.9 3.4*  CL 105  < > 98* 96*  CO2 25  < > 29 30  GLUCOSE 120*  < > 96 137*  BUN 7   < > 22* 14  CREATININE 0.33*  < > 0.46 0.31*  CALCIUM 7.3*  < > 6.8* 7.1*  MG 1.7  < > 1.8 1.7  PHOS  --   < > 3.5  --   AST 33  --   --   --   ALT 16  --   --   --   ALKPHOS 138*  --   --   --   BILITOT 0.5  --   --   --   < > = values in this interval not displayed.   Recent Labs Lab 08/01/16 1132 08/01/16 1621 08/01/16 1949 08/01/16 2351 08/02/16 0400 08/02/16 0732  GLUCAP 91 137* 125* 129* 137* 125*    Recent Labs Lab 07/30/16 0530 07/30/16 0635 08/01/16 0440  PHART 7.28* 7.35 7.43  PCO2ART 48 40 47  PO2ART 117* 142* 71*    Recent Labs Lab 07/27/16 0607 07/29/16 0501 07/30/16 0506  AST 33  --   --   ALT 16  --   --   ALKPHOS 138*  --   --   BILITOT 0.5  --   --   ALBUMIN 1.8* 1.7* 1.6*    Cardiac Enzymes No results for input(s): TROPONINI in the last 168 hours.  RADIOLOGY:  Dg Chest 1 View  Result Date: 08/02/2016 CLINICAL DATA:  Acute onset of dyspnea.  Initial encounter. EXAM: CHEST 1 VIEW COMPARISON:  Chest radiograph performed 08/01/2016 FINDINGS: The patient's endotracheal tube is seen ending 4-5 cm above the carina. A right PICC is noted ending about the cavoatrial junction. The lungs are well-aerated. Small to moderate bilateral pleural effusions are noted. Patchy bilateral airspace opacities may reflect pulmonary edema or pneumonia, perhaps slightly worsened from the prior study. No pneumothorax is seen. The cardiomediastinal silhouette is borderline normal in size. No acute osseous abnormalities are seen. Drains are noted overlying the upper quadrants. IMPRESSION: 1. Endotracheal tube seen ending 4-5 cm above the carina. 2. Small bilateral pleural effusions noted. Patchy bilateral airspace opacification may reflect pulmonary edema or pneumonia, perhaps slightly worsened from the prior study. Electronically Signed   By: Garald Balding M.D.   On: 08/02/2016 04:00   Dg Chest 1 View  Result Date: 08/01/2016 CLINICAL DATA:  Dyspnea EXAM: CHEST 1 VIEW  COMPARISON:  07/31/2016 FINDINGS: Endotracheal tube, nasogastric catheter and right-sided PICC line are again seen and stable. Small bore left chest tube is again identified. Bilateral pleural effusions are again seen similar to those noted on the prior CT examination. Surgical drains are noted in the right upper quadrant. No pneumothorax is seen. The loculated component of the left pleural effusion is not well appreciated on this exam. IMPRESSION:  Tubes and lines as described above. The overall appearance is stable from the prior study Electronically Signed   By: Inez Catalina M.D.   On: 08/01/2016 07:31   Ct Abdomen Pelvis W Contrast  Result Date: 08/01/2016 CLINICAL DATA:  History of complicated peptic ulcer disease repair with persistent intra-abdominal fluid collections EXAM: CT ABDOMEN AND PELVIS WITH CONTRAST TECHNIQUE: Multidetector CT imaging of the abdomen and pelvis was performed using the standard protocol following bolus administration of intravenous contrast. CONTRAST:  168mL ISOVUE-300 IOPAMIDOL (ISOVUE-300) INJECTION 61% COMPARISON:  07/27/2016 FINDINGS: Lower chest: Bibasilar atelectatic changes are noted stable from the prior exam. Bilateral pleural effusions are seen right greater than left which are slightly improved when compared with the prior exam. Small bore left chest tube is noted. There is a new loculated component of the left pleural effusion noted along the anterolateral cardiac margin. Hepatobiliary: Liver is again well visualized with a cyst within the medial segment of the left lobe of the liver stable from the prior exam. Gallbladder remains well distended. The previously seen perihepatic fluid collection has reduced significantly with removal of previously placed percutaneous drain and placement of a large bore surgical drain along the dome of the liver. Pancreas: Unremarkable. No pancreatic ductal dilatation or surrounding inflammatory changes. Spleen: The previously seen  subcapsular fluid collection along the medial aspect of the spleen has reduced in size now measuring 6 cm in greatest dimension with reduction in air component within. The remaining spleen enhances in a normal fashion. Adrenals/Urinary Tract: Adrenal glands are unremarkable. Kidneys are normal, without renal calculi, focal lesion, or hydronephrosis. Bladder is well distended despite placement of a Foley catheter. Clinical correlation is recommended. Stomach/Bowel: Nasogastric catheter is noted within the stomach. The stomach is decompressed. The previously seen retrogastric lesser sac collection has reduced significantly with only a minimal amount of fluid adjacent to the tip of surgically placed drain best seen on image number 31 of series 2. This measures approximately 2.4 cm in greatest dimension. A small collection is noted along the tip of the left lobe of the liver and extending along the anterior aspect of the stomach measuring approximately 7.4 by 2.0 cm. This collection was not present on the prior exam and possibly could be related to some retained fluid from the recent surgeries. The third surgically placed drain is noted along the left pericolic gutter and spleen. Contrast is noted within the collapsed colon. Vascular/Lymphatic: Aortic atherosclerosis. No enlarged abdominal or pelvic lymph nodes. Reproductive: Uterus and bilateral adnexa are unremarkable. Other: Minimal free pelvic fluid is noted which may be related to the recent surgery. A previously low seen bilobed collection noted just beneath the abdominal wall on the right anteriorly is again identified and has significantly reduced in size. It now measures approximately 7.6 cm by 2 cm. It previously measured 12 x 2.3 cm. A small collection in the left hemipelvis is again identified measuring 2.6 cm decreased in size from 3.1 cm. A Penrose drain is noted in the anterior abdominal wall in an area of prior subcutaneous fluid collection. The anterior  abdominal wall changes consistent with the recent surgery are noted. Mild changes of anasarca are noted in the subcutaneous tissues. Musculoskeletal: No acute bony abnormality is noted. IMPRESSION: Postsurgical changes are noted with multiple tubes and drains in place. The overall appearance of the intra-abdominal fluid collections has improved particularly in the region of the perihepatic fluid collection and subcapsular splenic fluid collection as well as the lesser sac fluid collection.  Reduction in an a right anterior intra-abdominal collection is seen although a new small collection is noted along the tip of the left lobe of the liver and anterior aspect of the stomach as described. It is possible this new small collection represent some retained fluid from the recent surgery. Bilateral pleural effusions which have improved from the prior exam right greater than left. A small bore left chest tube is noted in place. The left effusion demonstrates a somewhat more loculated component along the anteromedial margin of the mediastinum and heart. Nasogastric catheter remains in a predominately decompressed stomach no definitive perforation to account for the methylene blue passage into the surgical drain is noted although no contrast was administered within the stomach for this exam. Electronically Signed   By: Inez Catalina M.D.   On: 08/01/2016 07:30       --Marda Stalker, MD.  ICU Pager: [phone] [facility] Office Number: [phone]  Patricia Pesa, M.D.  Vilinda Boehringer, M.D.  Merton Border, M.D  08/02/2016   Critical Care Attestation.  I have personally obtained a history, examined the patient, evaluated laboratory and imaging results, formulated the assessment and plan and placed orders. The Patient requires high complexity decision making for assessment and support, frequent evaluation and titration of therapies, application of advanced monitoring technologies and  extensive interpretation of multiple databases. The patient has critical illness that could lead imminently to failure of 1 or more organ systems and requires the highest level of physician preparedness to intervene.  Critical Care Time devoted to patient care services described in this note is 40 minutes and is exclusive of time spent in procedures.

## 2016-08-02 NOTE — Progress Notes (Addendum)
PHARMACY - ADULT TOTAL PARENTERAL NUTRITION CONSULT NOTE   Pharmacy Consult for TPN Indication: Perforated ulcer  Patient Measurements: Height: 5\' 10"  (177.8 cm) Weight: 159 lb 13.3 oz (72.5 kg) IBW/kg (Calculated) : 68.5 TPN AdjBW (KG): 58.8 Body mass index is 22.93 kg/m.   Assessment: Pharmacy consulted to assist in the management of electrolytes and glucose in this 51 year old female being starting on parenteral nutrition. Patient extubated on 3/1.    Plan:  Will replace potassium 51mEq IV x 1 and magnesium 2g IV x 1.   Will continue Clinimix E 5/15 to 83 ml/hr with Trace elements and MVI along with lipids 20% @ 20 ml/hr for 12 hours daily per dietary's recommendations.  SSI ordered q4h checks.  Will recheck electrolytes with am labs.   Mcihael Hinderman L 08/02/2016,1:27 PM

## 2016-08-03 ENCOUNTER — Inpatient Hospital Stay: Payer: BLUE CROSS/BLUE SHIELD

## 2016-08-03 LAB — BODY FLUID CULTURE: Culture: NO GROWTH

## 2016-08-03 LAB — CBC
HEMATOCRIT: 25.2 % — AB (ref 35.0–47.0)
HEMOGLOBIN: 7.9 g/dL — AB (ref 12.0–16.0)
MCH: 23.7 pg — AB (ref 26.0–34.0)
MCHC: 31.5 g/dL — AB (ref 32.0–36.0)
MCV: 75.3 fL — ABNORMAL LOW (ref 80.0–100.0)
Platelets: 357 10*3/uL (ref 150–440)
RBC: 3.35 MIL/uL — ABNORMAL LOW (ref 3.80–5.20)
RDW: 25.3 % — AB (ref 11.5–14.5)
WBC: 29.7 10*3/uL — ABNORMAL HIGH (ref 3.6–11.0)

## 2016-08-03 LAB — GASTRIN: Gastrin: 282 pg/mL — ABNORMAL HIGH (ref 0–115)

## 2016-08-03 LAB — COMPREHENSIVE METABOLIC PANEL
ALBUMIN: 1.1 g/dL — AB (ref 3.5–5.0)
ALK PHOS: 155 U/L — AB (ref 38–126)
ALT: 19 U/L (ref 14–54)
AST: 36 U/L (ref 15–41)
Anion gap: 6 (ref 5–15)
BUN: 10 mg/dL (ref 6–20)
CALCIUM: 7.2 mg/dL — AB (ref 8.9–10.3)
CO2: 31 mmol/L (ref 22–32)
Chloride: 97 mmol/L — ABNORMAL LOW (ref 101–111)
Creatinine, Ser: 0.31 mg/dL — ABNORMAL LOW (ref 0.44–1.00)
GFR calc non Af Amer: >60 mL/min (ref >60)
Glucose, Bld: 127 mg/dL — ABNORMAL HIGH (ref 65–99)
POTASSIUM: 3.1 mmol/L — AB (ref 3.5–5.1)
SODIUM: 134 mmol/L — AB (ref 135–145)
Total Protein: 4.3 g/dL — ABNORMAL LOW (ref 6.5–8.1)

## 2016-08-03 LAB — MAGNESIUM: MAGNESIUM: 1.7 mg/dL (ref 1.7–2.4)

## 2016-08-03 LAB — GLUCOSE, CAPILLARY
GLUCOSE-CAPILLARY: 98 mg/dL (ref 65–99)
GLUCOSE-CAPILLARY: 122 mg/dL — AB (ref 65–99)
Glucose-Capillary: 119 mg/dL — ABNORMAL HIGH (ref 65–99)
Glucose-Capillary: 122 mg/dL — ABNORMAL HIGH (ref 65–99)
Glucose-Capillary: 125 mg/dL — ABNORMAL HIGH (ref 65–99)
Glucose-Capillary: 126 mg/dL — ABNORMAL HIGH (ref 65–99)

## 2016-08-03 LAB — PHOSPHORUS: PHOSPHORUS: 4 mg/dL (ref 2.5–4.6)

## 2016-08-03 LAB — TRIGLYCERIDES: TRIGLYCERIDES: 106 mg/dL (ref <150)

## 2016-08-03 LAB — POTASSIUM: POTASSIUM: 3.3 mmol/L — AB (ref 3.5–5.1)

## 2016-08-03 MED ORDER — TRACE MINERALS CR-CU-MN-SE-ZN 10-1000-500-60 MCG/ML IV SOLN
INTRAVENOUS | Status: AC
  Administered 2016-08-03: 18:00:00 via INTRAVENOUS
  Filled 2016-08-03: qty 1992

## 2016-08-03 MED ORDER — LORAZEPAM 2 MG/ML IJ SOLN
1.0000 mg | INTRAMUSCULAR | Status: DC
  Administered 2016-08-03: 1 mg via INTRAVENOUS
  Filled 2016-08-03: qty 1

## 2016-08-03 MED ORDER — FAT EMULSION 20 % IV EMUL
250.0000 mL | INTRAVENOUS | Status: AC
  Administered 2016-08-03: 250 mL via INTRAVENOUS
  Filled 2016-08-03: qty 250

## 2016-08-03 MED ORDER — AMIODARONE HCL IN DEXTROSE 360-4.14 MG/200ML-% IV SOLN
30.0000 mg/h | INTRAVENOUS | Status: DC
  Administered 2016-08-03: 30 mg/h via INTRAVENOUS
  Filled 2016-08-03 (×2): qty 200

## 2016-08-03 MED ORDER — LORAZEPAM 2 MG/ML IJ SOLN
1.0000 mg | INTRAMUSCULAR | Status: DC | PRN
  Administered 2016-08-04 (×2): 1 mg via INTRAVENOUS
  Administered 2016-08-05: 2 mg via INTRAVENOUS
  Filled 2016-08-03 (×3): qty 1

## 2016-08-03 MED ORDER — MAGNESIUM SULFATE 4 GM/100ML IV SOLN
4.0000 g | Freq: Once | INTRAVENOUS | Status: AC
  Administered 2016-08-03: 4 g via INTRAVENOUS
  Filled 2016-08-03: qty 100

## 2016-08-03 MED ORDER — SODIUM CHLORIDE 0.9 % IV SOLN
30.0000 meq | Freq: Once | INTRAVENOUS | Status: AC
  Administered 2016-08-03: 30 meq via INTRAVENOUS
  Filled 2016-08-03: qty 15

## 2016-08-03 NOTE — Progress Notes (Signed)
Nutrition Follow-up  DOCUMENTATION CODES:   Not applicable  INTERVENTION:  -TPN: Recommend changing to 5/AA/20%Dextrose at rate of 83 ml/hr, conitnue 20% ILE at rate of 20 ml/hr for 12 hours daily,  providing 100 g of protein, 2233 kcals, 2232 mL of fluid. GIR 3.9. Continue to assess,continue to monitor CBGs with increase in dextrose.  NUTRITION DIAGNOSIS:   Inadequate oral intake related to inability to eat as evidenced by NPO status.  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 Good UOP Labs: sodium 134, potassium 3.1, albumin 1.1, corrected calcium 9.5, TG 106 Meds: ss novolog, potassium chloride  Diet Order:  Diet NPO time specified Except for: Ice Chips .TPN (CLINIMIX-E) Adult .TPN (CLINIMIX-E) Adult  Skin:  Reviewed, no issues  Last BM:  07/28/16  Height:   Ht Readings from Last 1 Encounters:  07/20/16 '5\' 10"'  (1.778 m)    Weight:   Wt Readings from Last 1 Encounters:  08/03/16 156 lb 8.4 oz (71 kg)   Filed Weights   08/01/16 0500 08/02/16 0509 08/03/16 0500  Weight: 161 lb 6 oz (73.2 kg) 159 lb 13.3 oz (72.5 kg) 156 lb 8.4 oz (71 kg)    Ideal Body Weight:  68.18 kg  BMI:  Body mass index is 22.46 kg/m.  Estimated Nutritional Needs:   Kcal:  2050-2400 kcals  Protein:  102-136 g  Fluid:  >/= 2 L  EDUCATION NEEDS:   No education needs identified at this time  [place], [place], LDN [phone] Pager  [phone] Weekend/On-Call Pager

## 2016-08-03 NOTE — Progress Notes (Signed)
PHARMACY - ADULT TOTAL PARENTERAL NUTRITION CONSULT NOTE   Pharmacy Consult for TPN Indication: Perforated ulcer  Patient Measurements: Height: 5\' 10"  (177.8 cm) Weight: 156 lb 8.4 oz (71 kg) IBW/kg (Calculated) : 68.5 TPN AdjBW (KG): 58.8 Body mass index is 22.46 kg/m.   Assessment: Pharmacy consulted to assist in the management of electrolytes and glucose in this 51 year old female being starting on parenteral nutrition. Patient extubated on 3/1.   Plan:  Will replace potassium 40mEq IV x 2 and magnesium 4g IV x 1.   Will transition patient to Clinimix E 5/20 to 83 ml/hr with Trace elements and MVI along with lipids 20% @ 20 ml/hr for 12 hours daily per dietary's recommendations.  SSI ordered q4h checks.  Will recheck electrolytes with am labs.   Prestina Raigoza L 08/03/2016,7:39 PM

## 2016-08-03 NOTE — Progress Notes (Signed)
4 Days Post-Op   Subjective:  Patient reports that she states she's doing okay this morning all things considered. She remained extubated but in the ICU. She denies any complaints other than abdominal discomfort at her incision sites.  Vital signs in last 24 hours: Temp:  [98.2 F (36.8 C)-98.9 F (37.2 C)] 98.2 F (36.8 C) (03/02 0924) Pulse Rate:  [95-108] 97 (03/02 1000) Resp:  [15-24] 23 (03/02 1000) BP: (107-140)/(59-79) 123/65 (03/02 1000) SpO2:  [95 %-100 %] 100 % (03/02 1000) Arterial Line BP: (131)/(54) 131/54 (03/01 1200) Weight:  [71 kg (156 lb 8.4 oz)] 71 kg (156 lb 8.4 oz) (03/02 0500) Last BM Date: 07/28/16  Intake/Output from previous day: 03/01 0701 - 03/02 0700 In: 3280.8 [I.V.:2880.8; NG/GT:50; IV Piggyback:250] Out: [id number] [Urine:3511; Drains:220; Chest Tube:350]  GI: Abdomen is soft, appropriately tender to palpation at her incision sites. Midline incision well approximated over a Penrose drain with some seropurulent drainage from the most caudad aspect. JP drains 3 in place. The left sided and right upper drains with a serous and was output. The right lower drain has had a majority of the output and the output is consistent with the NG tube output. No evidence of erythema or peritonitis.  Lab Results:  CBC  Recent Labs  08/02/16 0515 08/03/16 0348  WBC 28.3* 29.7*  HGB 8.2* 7.9*  HCT 26.2* 25.2*  PLT 385 357   CMP     Component Value Date/Time   NA 134 (L) 08/03/2016 0348   K 3.1 (L) 08/03/2016 0348   CL 97 (L) 08/03/2016 0348   CO2 31 08/03/2016 0348   GLUCOSE 127 (H) 08/03/2016 0348   BUN 10 08/03/2016 0348   CREATININE 0.31 (L) 08/03/2016 0348   CALCIUM 7.2 (L) 08/03/2016 0348   PROT 4.3 (L) 08/03/2016 0348   ALBUMIN 1.1 (L) 08/03/2016 0348   AST 36 08/03/2016 0348   ALT 19 08/03/2016 0348   ALKPHOS 155 (H) 08/03/2016 0348   BILITOT <0.1 (L) 08/03/2016 0348   GFRNONAA >60 08/03/2016 0348   GFRAA >60 08/03/2016 0348   PT/INR No results  for input(s): LABPROT, INR in the last 72 hours.  Studies/Results: Dg Chest 1 View  Result Date: 08/03/2016 CLINICAL DATA:  Shortness of Breath EXAM: CHEST 1 VIEW COMPARISON:  August 02, 2016 FINDINGS: Endotracheal tube has been removed. Central catheter tip is at the cavoatrial junction. Nasogastric tube tip and side port are below the diaphragm. There are chest tubes bilaterally. No evident pneumothorax. There is interstitial edema bilaterally, slightly less than on 1 day prior. Edema is primarily perihilar in location on both sides. There is airspace consolidation in left base medially. There are pleural effusions bilaterally with a degree of loculated effusion on the left. There is air within this loculated effusion in the lateral left base, stable. There is mild cardiomegaly with pulmonary venous hypertension. No adenopathy evident. No bone lesions. IMPRESSION: Tube and catheter positions as described. No evident pneumothorax. Findings consistent with congestive heart failure. Superimposed pneumonia in the left base cannot be excluded radiographically. Note that there is loculated effusion on the left. A small amount of air within this fluid is noted laterally on the left, stable. Overall, there is slightly less interstitial edema compared to 1 day prior. Electronically Signed   By: Lowella Grip III M.D.   On: 08/03/2016 07:09   Dg Chest 1 View  Result Date: 08/02/2016 CLINICAL DATA:  Acute onset of dyspnea.  Initial encounter. EXAM: CHEST 1 VIEW  COMPARISON:  Chest radiograph performed 08/01/2016 FINDINGS: The patient's endotracheal tube is seen ending 4-5 cm above the carina. A right PICC is noted ending about the cavoatrial junction. The lungs are well-aerated. Small to moderate bilateral pleural effusions are noted. Patchy bilateral airspace opacities may reflect pulmonary edema or pneumonia, perhaps slightly worsened from the prior study. No pneumothorax is seen. The cardiomediastinal silhouette  is borderline normal in size. No acute osseous abnormalities are seen. Drains are noted overlying the upper quadrants. IMPRESSION: 1. Endotracheal tube seen ending 4-5 cm above the carina. 2. Small bilateral pleural effusions noted. Patchy bilateral airspace opacification may reflect pulmonary edema or pneumonia, perhaps slightly worsened from the prior study. Electronically Signed   By: Garald Balding M.D.   On: 08/02/2016 04:00    Assessment/Plan: 51 year old female status post Graham patch repair of anterior peptic ulcers 2. Continues to have a posteriorly draining third peptic ulcer that was thought to be an abscess that recent laparotomy. Clinically she continues to improve. The only outlier would be her persistent leukocytosis. Plan to continue TPN and bowel rest. Will discuss with the critical care team about the possibility of using somatostatin to decrease secretions and possibly assist in closure of what it is now a controlled posterior gastric fistula. Previously critical care assistance with scopic in the patient. Okay to remove chest tube per the surgical services showed pulmonary decide to do that.   Clayburn Pert, MD [facility]  [facility] [phone] [facility] [phone]  08/03/2016

## 2016-08-03 NOTE — Progress Notes (Signed)
[facility] Progess Note    ASSESSMENT/PLAN   51 yo female presented abdominal pain found to have perforated ulcers with multiple abdominal abscesses complicated by severe septic shock. S/p OR on 2/27.   PULMONARY A:Acute hypoxic respiratory failure-   Left loculated pleural effusion, likely reactive s/p left pigtail chest tube placement 2/26 Left chest tube output 350 yesterday.  P:   Continue to Await pleural fluid cx, negative thus far.   Continue chest tube drainage, can likely DC tube soon.  Extubated 3/1, now on Nasal cannula Supplemental O2 to maintain O2 sats > 92% Incentive Spirometry  CARDIOVASCULAR A: Septic shock with hypotension>>Resolved Afib with RVR, started on amiodarone infusion 2/27, now improved.  P:  Decrease IVF.  Can likely DC amiodarone, suspect that Afib was due to sepsis and hypotension.  Continuous telemetry monitoring  RENAL A:   Hypokalememia, Hypomagnesemia  P: Replace electrolytes per protocol Monitor I&O Follow BMP's  GASTROINTESTINAL/Surgical A:  Perforated ulcer s/p lap repair complicated by multiple abdominal abscesses.  P:   Continue protonix.  Follow Surgery recommendations, appreciate input   HEMATOLOGIC A:  Anemia   P:  Follow CBC's Monitor for S/Sx of bleeding Transfuse for Hgb <7 Lovenox & SCD's for VTE Prophylaxis  INFECTIOUS A:  Abdominal abscesses, s/p drainage. Leukocytosis is likely secondary, reactive.   P: Continue Zosyn Follow WBC's Monitor fever curve   Micro/culture results:  BCx2 2/16; negative.  UC -- Sputum -- MRSA PCR 2/16; negative.  Pleural fluid culture 2/26; pending.   Procalcitonin 2.14>>0.84  Antibiotics: Zosyn 2/17>> plan on 2 week course.   ENDOCRINE A:  No acute issues>>Glucose Controlled  P: CBG q4h Continue SSI q4h Follow Hypo/Hyperglycemia Protocol  NEUROLOGIC A:  No acute issues  P: Provide supportive care Avoid sedating medications Prn  xanax for anxiety and prn dilaudid for pain    MAJOR EVENTS/TEST RESULTS: 3/1: Extubated 2/26; washout of abdominal abscess.  2/26; left chest tube placement.  2/21; IR placement of 12 Fr abscess drain.  2/16; Lap repair of perf gastric ulcers  Best Practices  DVT Prophylaxis: enoxaparin GI Prophylaxis: protonix.    ---------------------------------------   ----------------------------------------   Name: Lindsey Wolf MRN: [id number] DOB: Jul 25, 1965    ADMISSION DATE:  07/20/2016   SUBJECTIVE:  Pt states she is feeling better, looking better today, awake, alert and in good spirits.   Review of Systems: Positives in BOLD Gen: Fever, chills, weight change, fatigue, night sweats HEENT: Blurred vision, double vision, hearing loss, tinnitus, sinus congestion, rhinorrhea, sore throat, neck stiffness, dysphagia PULM: Shortness of breath, cough, sputum production, hemoptysis, wheezing CV: Chest pain, edema, orthopnea, paroxysmal nocturnal dyspnea, palpitations GI: Abdominal pain, nausea, vomiting, diarrhea, hematochezia, melena, constipation, change in bowel habits GU: Dysuria, hematuria, polyuria, oliguria, urethral discharge Endocrine: Hot or cold intolerance, polyuria, polyphagia or appetite change Derm: Rash, dry skin, scaling or peeling skin change Heme: Easy bruising, bleeding, bleeding gums Neuro: Headache, numbness, weakness, slurred speech, loss of memory or consciousness    VITAL SIGNS: Temp:  [98.2 F (36.8 C)-98.9 F (37.2 C)] 98.2 F (36.8 C) (03/02 0924) Pulse Rate:  [95-111] 95 (03/02 0600) Resp:  [15-24] 22 (03/02 0600) BP: (107-140)/(59-79) 121/65 (03/02 0600) SpO2:  [95 %-100 %] 99 % (03/02 0600) Arterial Line BP: (131)/(54) 131/54 (03/01 1200) Weight:  [156 lb 8.4 oz (71 kg)] 156 lb 8.4 oz (71 kg) (03/02 0500) HEMODYNAMICS:   VENTILATOR SETTINGS:   INTAKE / OUTPUT:  Intake/Output Summary (Last 24 hours)  at 08/03/16 0955 Last data filed at  08/03/16 0857  Gross per 24 hour  Intake          3280.76 ml  Output             3861 ml  Net          -580.24 ml    PHYSICAL EXAMINATION: Physical Examination:   VS: BP 121/65   Pulse 95   Temp 98.2 F (36.8 C) (Axillary)   Resp (!) 22   Ht 5\' 10"  (1.778 m)   Wt 156 lb 8.4 oz (71 kg)   SpO2 99%   BMI 22.46 kg/m   General Appearance: Awake and alert, sitting in bed, feels well.  Neuro:without focal findings, pt awake and alert, follows commands.  HEENT: PERRLA, EOM intact. Pulmonary: Crackles on Left, symmetrical expansion, no accessory muscle use  Cardiovascular: RRR, S1&S2 noted.  No m/r/g.   Abdomen: Non-distended, Soft, Tender. Renal:  No costovertebral tenderness  GU:  Not performed at this time. Endocrine: No evident thyromegaly. Skin:   warm, no rashes, no ecchymosis.  Midline abdominal incision, 2 JP drains to RLQ of abdomen and 1 JP drain to LLQ of abdomen  Extremities: normal, no cyanosis, clubbing.   LABS:   LABORATORY PANEL:   CBC  Recent Labs Lab 08/03/16 0348  WBC 29.7*  HGB 7.9*  HCT 25.2*  PLT 357    Chemistries   Recent Labs Lab 08/03/16 0348  NA 134*  K 3.1*  CL 97*  CO2 31  GLUCOSE 127*  BUN 10  CREATININE 0.31*  CALCIUM 7.2*  MG 1.7  PHOS 4.0  AST 36  ALT 19  ALKPHOS 155*  BILITOT <0.1*     Recent Labs Lab 08/02/16 1121 08/02/16 1617 08/02/16 1948 08/02/16 2337 08/03/16 0339 08/03/16 0749  GLUCAP 106* 107* 102* 116* 119* 122*    Recent Labs Lab 07/30/16 0635 08/01/16 0440 08/02/16 0915  PHART 7.35 7.43 7.41  PCO2ART 40 47 51*  PO2ART 142* 71* 64*    Recent Labs Lab 07/29/16 0501 07/30/16 0506 08/03/16 0348  AST  --   --  36  ALT  --   --  19  ALKPHOS  --   --  155*  BILITOT  --   --  <0.1*  ALBUMIN 1.7* 1.6* 1.1*    Cardiac Enzymes No results for input(s): TROPONINI in the last 168 hours.  RADIOLOGY:  Dg Chest 1 View  Result Date: 08/03/2016 CLINICAL DATA:  Shortness of Breath EXAM: CHEST  1 VIEW COMPARISON:  August 02, 2016 FINDINGS: Endotracheal tube has been removed. Central catheter tip is at the cavoatrial junction. Nasogastric tube tip and side port are below the diaphragm. There are chest tubes bilaterally. No evident pneumothorax. There is interstitial edema bilaterally, slightly less than on 1 day prior. Edema is primarily perihilar in location on both sides. There is airspace consolidation in left base medially. There are pleural effusions bilaterally with a degree of loculated effusion on the left. There is air within this loculated effusion in the lateral left base, stable. There is mild cardiomegaly with pulmonary venous hypertension. No adenopathy evident. No bone lesions. IMPRESSION: Tube and catheter positions as described. No evident pneumothorax. Findings consistent with congestive heart failure. Superimposed pneumonia in the left base cannot be excluded radiographically. Note that there is loculated effusion on the left. A small amount of air within this fluid is noted laterally on the left, stable. Overall, there is slightly less interstitial  edema compared to 1 day prior. Electronically Signed   By: Lowella Grip III M.D.   On: 08/03/2016 07:09   Dg Chest 1 View  Result Date: 08/02/2016 CLINICAL DATA:  Acute onset of dyspnea.  Initial encounter. EXAM: CHEST 1 VIEW COMPARISON:  Chest radiograph performed 08/01/2016 FINDINGS: The patient's endotracheal tube is seen ending 4-5 cm above the carina. A right PICC is noted ending about the cavoatrial junction. The lungs are well-aerated. Small to moderate bilateral pleural effusions are noted. Patchy bilateral airspace opacities may reflect pulmonary edema or pneumonia, perhaps slightly worsened from the prior study. No pneumothorax is seen. The cardiomediastinal silhouette is borderline normal in size. No acute osseous abnormalities are seen. Drains are noted overlying the upper quadrants. IMPRESSION: 1. Endotracheal tube seen  ending 4-5 cm above the carina. 2. Small bilateral pleural effusions noted. Patchy bilateral airspace opacification may reflect pulmonary edema or pneumonia, perhaps slightly worsened from the prior study. Electronically Signed   By: Garald Balding M.D.   On: 08/02/2016 04:00     -Marda Stalker, M.D.  08/03/2016

## 2016-08-03 NOTE — Progress Notes (Signed)
Physical Therapy Treatment Patient Details Name: Lindsey Wolf MRN: [id number] DOB: [birth year] Today's Date: 08/03/2016    History of Present Illness Pt is a 51 year old female status post complicated repair of peptic ulcer disease. She later underwent a repeat laparotomy for drainage of intra-abdominal abscesses. Pt diagnosed with Acute hypoxic respiratory failure with L chest tube placement, septic shock with hypotension, and A-fib with RVR.  Pt also has multiple JP drains and was intubated but is now extubated.     PT Comments    Pt remains off vent this date, on room air and is making gradual progress towards goals with improved strength noted in B UE compared to B LEs. Pt still needs assist for performing there-ex on B LE. Pt also somewhat sleepy this date, keeping eyes closed during therapy. Pt fatigues quickly with there-ex with RR increased to high 30s, cues to slow down breathing. Will continue to progress.   Follow Up Recommendations  SNF     Equipment Recommendations  None recommended by PT    Recommendations for Other Services       Precautions / Restrictions Precautions Precautions: Fall Restrictions Weight Bearing Restrictions: No    Mobility  Bed Mobility               General bed mobility comments: not performed due to safety and fatigue  Transfers                    Ambulation/Gait                 Stairs            Wheelchair Mobility    Modified Rankin (Stroke Patients Only)       Balance                                    Cognition Arousal/Alertness: Awake/alert (keeps eyes closed during therapy) Behavior During Therapy: WFL for tasks assessed/performed Overall Cognitive Status: Within Functional Limits for tasks assessed                      Exercises Other Exercises Other Exercises: Supine ther-ex performed including B LE ankle pumps, quad sets, hip add squeezes, hip abd/add, SLRs,  and SAQ. Pt also performed B UE resisted elbow flexion/extension. All ther-ex performed x 12 reps with cues and min/mod assist    General Comments        Pertinent Vitals/Pain Pain Assessment: No/denies pain    Home Living                      Prior Function            PT Goals (current goals can now be found in the care plan section) Acute Rehab PT Goals Patient Stated Goal: to get stronger PT Goal Formulation: Patient unable to participate in goal setting Progress towards PT goals: Progressing toward goals    Frequency    Min 2X/week      PT Plan Current plan remains appropriate    Co-evaluation             End of Session   Activity Tolerance: Patient tolerated treatment well Patient left: in bed;with call bell/phone within reach;with family/visitor present Nurse Communication: Mobility status PT Visit Diagnosis: Muscle weakness (generalized) (M62.81);Difficulty in walking, not elsewhere classified (R26.2)     Time: [id number] PT  Time Calculation (min) (ACUTE ONLY): 14 min  Charges:  $Therapeutic Exercise: 8-22 mins                    G Codes:       Emett Stapel 08/10/2016, 4:47 PM  Greggory Stallion, PT, DPT [phone]

## 2016-08-04 ENCOUNTER — Inpatient Hospital Stay: Payer: BLUE CROSS/BLUE SHIELD

## 2016-08-04 LAB — BASIC METABOLIC PANEL
Anion gap: 4 — ABNORMAL LOW (ref 5–15)
BUN: 10 mg/dL (ref 6–20)
CALCIUM: 7.2 mg/dL — AB (ref 8.9–10.3)
CO2: 33 mmol/L — ABNORMAL HIGH (ref 22–32)
CREATININE: 0.38 mg/dL — AB (ref 0.44–1.00)
Chloride: 97 mmol/L — ABNORMAL LOW (ref 101–111)
GFR calc Af Amer: >60 mL/min (ref >60)
GLUCOSE: 157 mg/dL — AB (ref 65–99)
Potassium: 3.1 mmol/L — ABNORMAL LOW (ref 3.5–5.1)
SODIUM: 134 mmol/L — AB (ref 135–145)

## 2016-08-04 LAB — CBC
HCT: 23.8 % — ABNORMAL LOW (ref 35.0–47.0)
Hemoglobin: 7.5 g/dL — ABNORMAL LOW (ref 12.0–16.0)
MCH: 23.7 pg — ABNORMAL LOW (ref 26.0–34.0)
MCHC: 31.4 g/dL — ABNORMAL LOW (ref 32.0–36.0)
MCV: 75.4 fL — AB (ref 80.0–100.0)
PLATELETS: 423 10*3/uL (ref 150–440)
RBC: 3.16 MIL/uL — AB (ref 3.80–5.20)
RDW: 25.8 % — AB (ref 11.5–14.5)
WBC: 24.5 10*3/uL — ABNORMAL HIGH (ref 3.6–11.0)

## 2016-08-04 LAB — GLUCOSE, CAPILLARY
GLUCOSE-CAPILLARY: 119 mg/dL — AB (ref 65–99)
Glucose-Capillary: 131 mg/dL — ABNORMAL HIGH (ref 65–99)
Glucose-Capillary: 112 mg/dL — ABNORMAL HIGH (ref 65–99)
Glucose-Capillary: 121 mg/dL — ABNORMAL HIGH (ref 65–99)
Glucose-Capillary: 106 mg/dL — ABNORMAL HIGH (ref 65–99)

## 2016-08-04 LAB — MAGNESIUM: MAGNESIUM: 1.8 mg/dL (ref 1.7–2.4)

## 2016-08-04 MED ORDER — MAGNESIUM SULFATE 2 GM/50ML IV SOLN
2.0000 g | Freq: Once | INTRAVENOUS | Status: AC
  Administered 2016-08-04: 2 g via INTRAVENOUS
  Filled 2016-08-04: qty 50

## 2016-08-04 MED ORDER — M.V.I. ADULT IV INJ
INJECTION | INTRAVENOUS | Status: DC
  Administered 2016-08-04: 17:00:00 via INTRAVENOUS
  Filled 2016-08-04: qty 1992

## 2016-08-04 MED ORDER — SODIUM CHLORIDE 0.9 % IV SOLN
30.0000 meq | INTRAVENOUS | Status: AC
  Administered 2016-08-04 (×2): 30 meq via INTRAVENOUS
  Filled 2016-08-04 (×2): qty 15

## 2016-08-04 MED ORDER — METOPROLOL TARTRATE 5 MG/5ML IV SOLN: 2.5000 mg | INTRAVENOUS | Status: DC | PRN

## 2016-08-04 MED ORDER — FAT EMULSION 20 % IV EMUL
250.0000 mL | INTRAVENOUS | Status: AC
  Administered 2016-08-04: 250 mL via INTRAVENOUS
  Filled 2016-08-04: qty 250

## 2016-08-04 NOTE — Progress Notes (Signed)
PHARMACY - ADULT TOTAL PARENTERAL NUTRITION CONSULT NOTE   Pharmacy Consult for TPN Indication: Perforated ulcer  Patient Measurements: Height: 5\' 10"  (177.8 cm) Weight: 157 lb 13.6 oz (71.6 kg) IBW/kg (Calculated) : 68.5 TPN AdjBW (KG): 58.8 Body mass index is 22.65 kg/m.   Assessment: Pharmacy consulted to assist in the management of electrolytes and glucose in this 51 year old female being starting on parenteral nutrition. Patient extubated on 3/1.   Plan:  Will replace potassium 38mEq IV x 2 and magnesium 2g IV x 1.   Will continueClinimix E 5/20 to 83 ml/hr with Trace elements and MVI along with lipids 20% @ 20 ml/hr for 12 hours daily per dietary's recommendations.  SSI ordered q4h checks.  Will recheck electrolytes with am labs.   Jayvin Hurrell C 08/04/2016,7:49 AM

## 2016-08-04 NOTE — Progress Notes (Signed)
Pts dressing changed with Dr. Adonis Huguenin at this time.

## 2016-08-04 NOTE — Progress Notes (Signed)
5 Days Post-Op   Subjective:  Patient remains in ICU. She is without new complaints today. Continues to complain of soreness. Overall states she thinks she is doing better.  Vital signs in last 24 hours: Temp:  [98.1 F (36.7 C)-98.4 F (36.9 C)] 98.1 F (36.7 C) (03/03 0500) Pulse Rate:  [88-104] 93 (03/03 0600) Resp:  [15-26] 22 (03/03 0600) BP: (108-124)/(58-67) 115/65 (03/03 0600) SpO2:  [97 %-100 %] 100 % (03/03 0600) Weight:  [71.6 kg (157 lb 13.6 oz)] 71.6 kg (157 lb 13.6 oz) (03/03 0345) Last BM Date: 07/28/16  Intake/Output from previous day: 03/02 0701 - 03/03 0700 In: 1225.7 [I.V.:1225.7] Out: 2665 [Urine:2500; Emesis/NG output:15; Drains:150]  GI: Abdomen is soft, nondistended, appropriately tender to palpation along her midline. JP drains in place. Right upper and left-sided drains continued to have minimal output. The right lower drain continues to have darker drainage consistent with NG output. No signs of purulence.  Lab Results:  CBC  Recent Labs  08/03/16 0348 08/04/16 0525  WBC 29.7* 24.5*  HGB 7.9* 7.5*  HCT 25.2* 23.8*  PLT 357 423   CMP     Component Value Date/Time   NA 134 (L) 08/04/2016 0525   K 3.1 (L) 08/04/2016 0525   CL 97 (L) 08/04/2016 0525   CO2 33 (H) 08/04/2016 0525   GLUCOSE 157 (H) 08/04/2016 0525   BUN 10 08/04/2016 0525   CREATININE 0.38 (L) 08/04/2016 0525   CALCIUM 7.2 (L) 08/04/2016 0525   PROT 4.3 (L) 08/03/2016 0348   ALBUMIN 1.1 (L) 08/03/2016 0348   AST 36 08/03/2016 0348   ALT 19 08/03/2016 0348   ALKPHOS 155 (H) 08/03/2016 0348   BILITOT <0.1 (L) 08/03/2016 0348   GFRNONAA >60 08/04/2016 0525   GFRAA >60 08/04/2016 0525   PT/INR No results for input(s): LABPROT, INR in the last 72 hours.  Studies/Results: Dg Chest 1 View  Result Date: 08/04/2016 CLINICAL DATA:  Dyspnea. EXAM: CHEST 1 VIEW COMPARISON:  08/03/2016 FINDINGS: Right PICC terminates over the high right atrium, unchanged. Enteric tube courses into  the left upper abdomen with tip not imaged. Bilateral chest tubes remain in place. The cardiomediastinal silhouette is unchanged. Small bilateral pleural effusions have not significantly changed. A small amount of loculated gas is again noted in the basilar component of the left pleural effusion, unchanged. Pulmonary vascular congestion and bilateral airspace and interstitial lung opacities have not significantly changed. IMPRESSION: No interval change. Bilateral pleural effusions with chest tubes in place. Pulmonary edema with bibasilar atelectasis or consolidation. Electronically Signed   By: Logan Bores M.D.   On: 08/04/2016 07:07   Dg Chest 1 View  Result Date: 08/03/2016 CLINICAL DATA:  Shortness of Breath EXAM: CHEST 1 VIEW COMPARISON:  August 02, 2016 FINDINGS: Endotracheal tube has been removed. Central catheter tip is at the cavoatrial junction. Nasogastric tube tip and side port are below the diaphragm. There are chest tubes bilaterally. No evident pneumothorax. There is interstitial edema bilaterally, slightly less than on 1 day prior. Edema is primarily perihilar in location on both sides. There is airspace consolidation in left base medially. There are pleural effusions bilaterally with a degree of loculated effusion on the left. There is air within this loculated effusion in the lateral left base, stable. There is mild cardiomegaly with pulmonary venous hypertension. No adenopathy evident. No bone lesions. IMPRESSION: Tube and catheter positions as described. No evident pneumothorax. Findings consistent with congestive heart failure. Superimposed pneumonia in the left base  cannot be excluded radiographically. Note that there is loculated effusion on the left. A small amount of air within this fluid is noted laterally on the left, stable. Overall, there is slightly less interstitial edema compared to 1 day prior. Electronically Signed   By: Lowella Grip III M.D.   On: 08/03/2016 07:09     Assessment/Plan: 51 year old female status post exploratory laparotomy 2 secondary to perforated peptic ulcers. Continues to have a contained draining posterior perforation. The output is decreasing daily. Appreciate critical care assistance with this complex patient. Plan is to remove her chest tube and stop her amiodarone today. If she tolerates those 2 procedures today possible transfer to the floor tomorrow. Continue IV antibiotics and JP drains. Continue TPN. Strict nothing by mouth. If output increases from drain/ngt would consider somatostatin treatment in attempt to decrease secretions to allow posterior ulcer to heal.   Clayburn Pert, MD [facility]  [facility] [phone] [facility] [phone]  08/04/2016

## 2016-08-04 NOTE — Progress Notes (Signed)
[facility] Progess Note    ASSESSMENT/PLAN   51 yo female presented abdominal pain found to have perforated ulcers with multiple abdominal abscesses complicated by severe septic shock now doing better and extubated.   PULMONARY A:Acute hypoxic respiratory failure-   Left loculated pleural effusion, likely reactive s/p left pigtail chest tube placement 2/26; now resolved, minimal drainage output.  P:   Continue to Await pleural fluid cx, negative thus far.   Will DC chest tube today.  Extubated 3/1, now on Nasal cannula Supplemental O2 to maintain O2 sats > 92% Incentive Spirometry  CARDIOVASCULAR A: Septic shock with hypotension>>Resolved Afib with RVR, started on amiodarone infusion 2/27, now improved.  P:   Can likely DC amiodarone, suspect that Afib was due to sepsis and hypotension.  Can not take PO, will start IV metoprol prn for rate control as needed.   RENAL A:   Hypokalememia, Hypomagnesemia  P: Replace electrolytes per protocol Monitor I&O Follow BMP's  GASTROINTESTINAL/Surgical A:  Perforated ulcer s/p lap repair complicated by multiple abdominal abscesses.  P:   Continue protonix.  Follow Surgery recommendations, appreciate input   HEMATOLOGIC A:  Anemia   P:  Follow CBC's Monitor for S/Sx of bleeding Transfuse for Hgb <7 Lovenox & SCD's for VTE Prophylaxis  INFECTIOUS A:  Abdominal abscesses, s/p drainage. Leukocytosis is likely secondary, reactive.   P: Continue Zosyn Follow WBC's Monitor fever curve   Micro/culture results:  BCx2 2/16; negative.  UC -- Sputum -- MRSA PCR 2/16; negative.  Pleural fluid culture 2/26; pending.   Procalcitonin 2.14>>0.84  Antibiotics: Zosyn 2/17>> plan on 2 week course.   ENDOCRINE A:  No acute issues>>Glucose Controlled  P: CBG q4h Continue SSI q4h Follow Hypo/Hyperglycemia Protocol  NEUROLOGIC A:  No acute issues  P: Provide supportive care Avoid sedating  medications Prn xanax for anxiety and prn dilaudid for pain    MAJOR EVENTS/TEST RESULTS: 3/1: Extubated 2/26; washout of abdominal abscess.  2/26; left chest tube placement.  2/21; IR placement of 12 Fr abscess drain.  2/16; Lap repair of perf gastric ulcers  Best Practices  DVT Prophylaxis: enoxaparin GI Prophylaxis: protonix.    ---------------------------------------   ----------------------------------------   Name: PRANATI CLERKIN MRN: [id number] DOB: 16-Mar-1966    ADMISSION DATE:  07/20/2016   SUBJECTIVE:  Pt states she is feeling better, looking better today, awake, alert and in good spirits.   Review of Systems: Positives in BOLD Gen: Fever, chills, weight change, fatigue, night sweats HEENT: Blurred vision, double vision, hearing loss, tinnitus, sinus congestion, rhinorrhea, sore throat, neck stiffness, dysphagia PULM: Shortness of breath, cough, sputum production, hemoptysis, wheezing CV: Chest pain, edema, orthopnea, paroxysmal nocturnal dyspnea, palpitations GI: Abdominal pain, nausea, vomiting, diarrhea, hematochezia, melena, constipation, change in bowel habits GU: Dysuria, hematuria, polyuria, oliguria, urethral discharge Endocrine: Hot or cold intolerance, polyuria, polyphagia or appetite change Derm: Rash, dry skin, scaling or peeling skin change Heme: Easy bruising, bleeding, bleeding gums Neuro: Headache, numbness, weakness, slurred speech, loss of memory or consciousness    VITAL SIGNS: Temp:  [98.1 F (36.7 C)-98.4 F (36.9 C)] 98.1 F (36.7 C) (03/03 0500) Pulse Rate:  [88-104] 93 (03/03 0600) Resp:  [15-26] 22 (03/03 0600) BP: (108-124)/(58-67) 115/65 (03/03 0600) SpO2:  [97 %-100 %] 100 % (03/03 0600) Weight:  [157 lb 13.6 oz (71.6 kg)] 157 lb 13.6 oz (71.6 kg) (03/03 0345) HEMODYNAMICS:   VENTILATOR SETTINGS:   INTAKE / OUTPUT:  Intake/Output Summary (Last 24 hours) at  08/04/16 0817 Last data filed at 08/04/16 0532  Gross per 24  hour  Intake          1225.68 ml  Output             2665 ml  Net         -1439.32 ml    PHYSICAL EXAMINATION: Physical Examination:   VS: BP 115/65   Pulse 93   Temp 98.1 F (36.7 C) (Oral)   Resp (!) 22   Ht 5\' 10"  (1.778 m)   Wt 157 lb 13.6 oz (71.6 kg)   SpO2 100%   BMI 22.65 kg/m   General Appearance: Awake and alert, sitting in bed, feels well.  Neuro:without focal findings, pt awake and alert, follows commands.  HEENT: PERRLA, EOM intact. Pulmonary: Crackles on Left, symmetrical expansion, no accessory muscle use  Cardiovascular: RRR, S1&S2 noted.  No m/r/g.   Abdomen: Non-distended, Soft, Tender. Renal:  No costovertebral tenderness  GU:  Not performed at this time. Endocrine: No evident thyromegaly. Skin:   warm, no rashes, no ecchymosis.  Midline abdominal incision, 2 JP drains to RLQ of abdomen and 1 JP drain to LLQ of abdomen  Extremities: normal, no cyanosis, clubbing.   LABS:   LABORATORY PANEL:   CBC  Recent Labs Lab 08/04/16 0525  WBC 24.5*  HGB 7.5*  HCT 23.8*  PLT 423    Chemistries   Recent Labs Lab 08/03/16 0348  08/04/16 0525  NA 134*  --  134*  K 3.1*  < > 3.1*  CL 97*  --  97*  CO2 31  --  33*  GLUCOSE 127*  --  157*  BUN 10  --  10  CREATININE 0.31*  --  0.38*  CALCIUM 7.2*  --  7.2*  MG 1.7  --  1.8  PHOS 4.0  --   --   AST 36  --   --   ALT 19  --   --   ALKPHOS 155*  --   --   BILITOT <0.1*  --   --   < > = values in this interval not displayed.   Recent Labs Lab 08/03/16 1148 08/03/16 1621 08/03/16 1948 08/03/16 2345 08/04/16 0405 08/04/16 0735  GLUCAP 125* 98 122* 126* 131* 112*    Recent Labs Lab 07/30/16 0635 08/01/16 0440 08/02/16 0915  PHART 7.35 7.43 7.41  PCO2ART 40 47 51*  PO2ART 142* 71* 64*    Recent Labs Lab 07/29/16 0501 07/30/16 0506 08/03/16 0348  AST  --   --  36  ALT  --   --  19  ALKPHOS  --   --  155*  BILITOT  --   --  <0.1*  ALBUMIN 1.7* 1.6* 1.1*    Cardiac  Enzymes No results for input(s): TROPONINI in the last 168 hours.  RADIOLOGY:  Dg Chest 1 View  Result Date: 08/04/2016 CLINICAL DATA:  Dyspnea. EXAM: CHEST 1 VIEW COMPARISON:  08/03/2016 FINDINGS: Right PICC terminates over the high right atrium, unchanged. Enteric tube courses into the left upper abdomen with tip not imaged. Bilateral chest tubes remain in place. The cardiomediastinal silhouette is unchanged. Small bilateral pleural effusions have not significantly changed. A small amount of loculated gas is again noted in the basilar component of the left pleural effusion, unchanged. Pulmonary vascular congestion and bilateral airspace and interstitial lung opacities have not significantly changed. IMPRESSION: No interval change. Bilateral pleural effusions with chest tubes in place. Pulmonary  edema with bibasilar atelectasis or consolidation. Electronically Signed   By: Logan Bores M.D.   On: 08/04/2016 07:07   Dg Chest 1 View  Result Date: 08/03/2016 CLINICAL DATA:  Shortness of Breath EXAM: CHEST 1 VIEW COMPARISON:  August 02, 2016 FINDINGS: Endotracheal tube has been removed. Central catheter tip is at the cavoatrial junction. Nasogastric tube tip and side port are below the diaphragm. There are chest tubes bilaterally. No evident pneumothorax. There is interstitial edema bilaterally, slightly less than on 1 day prior. Edema is primarily perihilar in location on both sides. There is airspace consolidation in left base medially. There are pleural effusions bilaterally with a degree of loculated effusion on the left. There is air within this loculated effusion in the lateral left base, stable. There is mild cardiomegaly with pulmonary venous hypertension. No adenopathy evident. No bone lesions. IMPRESSION: Tube and catheter positions as described. No evident pneumothorax. Findings consistent with congestive heart failure. Superimposed pneumonia in the left base cannot be excluded radiographically. Note  that there is loculated effusion on the left. A small amount of air within this fluid is noted laterally on the left, stable. Overall, there is slightly less interstitial edema compared to 1 day prior. Electronically Signed   By: Lowella Grip III M.D.   On: 08/03/2016 07:09     -Marda Stalker, M.D.  08/04/2016

## 2016-08-05 ENCOUNTER — Inpatient Hospital Stay: Payer: BLUE CROSS/BLUE SHIELD

## 2016-08-05 LAB — CBC
HCT: 24.7 % — ABNORMAL LOW (ref 35.0–47.0)
Hemoglobin: 7.7 g/dL — ABNORMAL LOW (ref 12.0–16.0)
MCH: 23.6 pg — ABNORMAL LOW (ref 26.0–34.0)
MCHC: 31.2 g/dL — ABNORMAL LOW (ref 32.0–36.0)
MCV: 75.5 fL — AB (ref 80.0–100.0)
PLATELETS: 504 10*3/uL — AB (ref 150–440)
RBC: 3.27 MIL/uL — AB (ref 3.80–5.20)
RDW: 25.9 % — ABNORMAL HIGH (ref 11.5–14.5)
WBC: 27.8 10*3/uL — AB (ref 3.6–11.0)

## 2016-08-05 LAB — GLUCOSE, CAPILLARY
GLUCOSE-CAPILLARY: 104 mg/dL — AB (ref 65–99)
GLUCOSE-CAPILLARY: 115 mg/dL — AB (ref 65–99)
GLUCOSE-CAPILLARY: 116 mg/dL — AB (ref 65–99)
GLUCOSE-CAPILLARY: 123 mg/dL — AB (ref 65–99)
GLUCOSE-CAPILLARY: 126 mg/dL — AB (ref 65–99)
Glucose-Capillary: 126 mg/dL — ABNORMAL HIGH (ref 65–99)

## 2016-08-05 LAB — BASIC METABOLIC PANEL
Anion gap: 7 (ref 5–15)
BUN: 9 mg/dL (ref 6–20)
CALCIUM: 7.3 mg/dL — AB (ref 8.9–10.3)
CHLORIDE: 101 mmol/L (ref 101–111)
CO2: 30 mmol/L (ref 22–32)
Glucose, Bld: 142 mg/dL — ABNORMAL HIGH (ref 65–99)
Potassium: 3.2 mmol/L — ABNORMAL LOW (ref 3.5–5.1)
SODIUM: 138 mmol/L (ref 135–145)

## 2016-08-05 LAB — POTASSIUM: Potassium: 3.1 mmol/L — ABNORMAL LOW (ref 3.5–5.1)

## 2016-08-05 LAB — MAGNESIUM: Magnesium: 1.7 mg/dL (ref 1.7–2.4)

## 2016-08-05 LAB — PHOSPHORUS: Phosphorus: 3.3 mg/dL (ref 2.5–4.6)

## 2016-08-05 MED ORDER — MAGNESIUM SULFATE 2 GM/50ML IV SOLN
2.0000 g | Freq: Once | INTRAVENOUS | Status: DC
  Filled 2016-08-05: qty 50

## 2016-08-05 MED ORDER — FAT EMULSION 20 % IV EMUL
250.0000 mL | INTRAVENOUS | Status: AC
  Administered 2016-08-05: 250 mL via INTRAVENOUS
  Filled 2016-08-05: qty 250

## 2016-08-05 MED ORDER — VANCOMYCIN HCL IN DEXTROSE 1-5 GM/200ML-% IV SOLN
1000.0000 mg | Freq: Once | INTRAVENOUS | Status: AC
  Administered 2016-08-05: 1000 mg via INTRAVENOUS
  Filled 2016-08-05: qty 200

## 2016-08-05 MED ORDER — MAGNESIUM SULFATE 4 GM/100ML IV SOLN
4.0000 g | Freq: Once | INTRAVENOUS | Status: DC
  Filled 2016-08-05: qty 100

## 2016-08-05 MED ORDER — SODIUM CHLORIDE 0.9 % IV SOLN
30.0000 meq | Freq: Once | INTRAVENOUS | Status: AC
  Administered 2016-08-05: 30 meq via INTRAVENOUS
  Filled 2016-08-05: qty 15

## 2016-08-05 MED ORDER — MAGNESIUM SULFATE 2 GM/50ML IV SOLN
2.0000 g | INTRAVENOUS | Status: AC
  Administered 2016-08-05 (×2): 2 g via INTRAVENOUS
  Filled 2016-08-05: qty 50

## 2016-08-05 MED ORDER — SODIUM CHLORIDE 0.9 % IV SOLN
30.0000 meq | INTRAVENOUS | Status: AC
Start: 2016-08-05 — End: 2016-08-06
  Administered 2016-08-05 (×2): 30 meq via INTRAVENOUS
  Filled 2016-08-05 (×2): qty 15

## 2016-08-05 MED ORDER — FUROSEMIDE 10 MG/ML IJ SOLN
20.0000 mg | Freq: Three times a day (TID) | INTRAMUSCULAR | Status: AC
  Administered 2016-08-05 – 2016-08-06 (×3): 20 mg via INTRAVENOUS
  Filled 2016-08-05 (×3): qty 2

## 2016-08-05 MED ORDER — VANCOMYCIN HCL IN DEXTROSE 1-5 GM/200ML-% IV SOLN
1000.0000 mg | Freq: Two times a day (BID) | INTRAVENOUS | Status: DC
  Administered 2016-08-05 – 2016-08-06 (×2): 1000 mg via INTRAVENOUS
  Filled 2016-08-05 (×3): qty 200

## 2016-08-05 MED ORDER — TRACE MINERALS CR-CU-MN-SE-ZN 10-1000-500-60 MCG/ML IV SOLN
INTRAVENOUS | Status: AC
  Administered 2016-08-05: 16:00:00 via INTRAVENOUS
  Filled 2016-08-05: qty 1992

## 2016-08-05 NOTE — Progress Notes (Signed)
6 Days Post-Op   Subjective:  Patient remained in ICU. Was able to be weaned off of amiodarone yesterday and had left chest tube removed. She reports no new complaints this morning however was documented to have tachypnea overnight. Check she states she's not having any pain.  Vital signs in last 24 hours: Temp:  [98.4 F (36.9 C)] 98.4 F (36.9 C) (03/04 0400) Pulse Rate:  [91-111] 106 (03/04 0700) Resp:  [25-40] 27 (03/04 0700) BP: (115-148)/(62-97) 126/97 (03/04 0700) SpO2:  [94 %-100 %] 99 % (03/04 0700) Weight:  [70.9 kg (156 lb 4.9 oz)] 70.9 kg (156 lb 4.9 oz) (03/04 0413) Last BM Date: 07/28/16  Intake/Output from previous day: 03/03 0701 - 03/04 0700 In: 2139.3 [I.V.:1409.3; IV Piggyback:730] Out: [id number] [Urine:4075; Drains:83; Chest Tube:70]  GI: Abdomen soft, mildly tender to palpation along her incision sites. Midline staples in place with a Penrose drain coming out the inferior aspect. There is a murky drainage from the top and bottom of the incision. JP drains in place 3. Right upper and left sided drains with minimal output the right lower drain with continued output but becoming clear. No longer matches the NG output.  Lab Results:  CBC  Recent Labs  08/04/16 0525 08/05/16 0500  WBC 24.5* 27.8*  HGB 7.5* 7.7*  HCT 23.8* 24.7*  PLT 423 504*   CMP     Component Value Date/Time   NA 138 08/05/2016 0500   K 3.2 (L) 08/05/2016 0500   CL 101 08/05/2016 0500   CO2 30 08/05/2016 0500   GLUCOSE 142 (H) 08/05/2016 0500   BUN 9 08/05/2016 0500   CREATININE <0.30 (L) 08/05/2016 0500   CALCIUM 7.3 (L) 08/05/2016 0500   PROT 4.3 (L) 08/03/2016 0348   ALBUMIN 1.1 (L) 08/03/2016 0348   AST 36 08/03/2016 0348   ALT 19 08/03/2016 0348   ALKPHOS 155 (H) 08/03/2016 0348   BILITOT <0.1 (L) 08/03/2016 0348   GFRNONAA NOT CALCULATED 08/05/2016 0500   GFRAA NOT CALCULATED 08/05/2016 0500   PT/INR No results for input(s): LABPROT, INR in the last 72  hours.  Studies/Results: Dg Chest 1 View  Result Date: 08/04/2016 CLINICAL DATA:  Dyspnea. EXAM: CHEST 1 VIEW COMPARISON:  08/03/2016 FINDINGS: Right PICC terminates over the high right atrium, unchanged. Enteric tube courses into the left upper abdomen with tip not imaged. Bilateral chest tubes remain in place. The cardiomediastinal silhouette is unchanged. Small bilateral pleural effusions have not significantly changed. A small amount of loculated gas is again noted in the basilar component of the left pleural effusion, unchanged. Pulmonary vascular congestion and bilateral airspace and interstitial lung opacities have not significantly changed. IMPRESSION: No interval change. Bilateral pleural effusions with chest tubes in place. Pulmonary edema with bibasilar atelectasis or consolidation. Electronically Signed   By: Logan Bores M.D.   On: 08/04/2016 07:07    Assessment/Plan: 51 year old female now 6 days status post revision export her laparotomy for drainage of intra-abdominal abscesses. The posterior peptic ulcer/abscess continues to have the majority of the drainage but this is also appears to be improving. Tachypnea overnight. Chest x-ray performed today and reviewed with pulmonologist. Possibly related to fluid overload. Appreciate pulmonology/critical care assistance with this patient. Due to the midline incision and drainage characteristics and increasing white blood cell count today plan to broaden antibiotic coverage with addition of vancomycin they'll be dosed by pharmacy. Discussed with nursing staff that it is okay for the patient to be out of bed. There went  to work with the patient to get her at least up to chair today. Encourage incentive spirometer usage. Continue NG tube decompression, continue TPN.   Clayburn Pert, MD [facility]  Day [facility] [phone] Night [facility] [phone]  08/05/2016

## 2016-08-05 NOTE — Progress Notes (Signed)
PHARMACY - ADULT TOTAL PARENTERAL NUTRITION CONSULT NOTE   Pharmacy Consult for TPN Indication: Perforated ulcer  Patient Measurements: Height: 5\' 10"  (177.8 cm) Weight: 157 lb 13.6 oz (71.6 kg) IBW/kg (Calculated) : 68.5 TPN AdjBW (KG): 58.8 Body mass index is 22.65 kg/m.   Assessment: Pharmacy consulted to assist in the management of electrolytes and glucose in this 51 year old female being starting on parenteral nutrition. Patient extubated on 3/1.   Plan:  Will replace potassium 49mEq IV x 2 and magnesium 2g IV x 1.   3/4 @ 0500 K+ 3.2, Mg 1.7 -- will replace w/ KCL 30 mEq IV x 1 and Mg 2g IV x 1 and recheck electrolytes @ 0900.  Will continueClinimix E 5/20 to 83 ml/hr with Trace elements and MVI along with lipids 20% @ 20 ml/hr for 12 hours daily per dietary's recommendations.  SSI ordered q4h checks.  Will recheck electrolytes with am labs.   Thank you for this consult.

## 2016-08-05 NOTE — Progress Notes (Signed)
PHARMACY - ADULT TOTAL PARENTERAL NUTRITION CONSULT NOTE   Pharmacy Consult for TPN/ electrolytes Indication: Perforated ulcer  Patient Measurements: Height: 5\' 10"  (177.8 cm) Weight: 157 lb 13.6 oz (71.6 kg) IBW/kg (Calculated) : 68.5 TPN AdjBW (KG): 58.8 Body mass index is 22.65 kg/m.  BMP Latest Ref Rng & Units 08/05/2016 08/05/2016 08/04/2016  Glucose 65 - 99 mg/dL - 142(H) 157(H)  BUN 6 - 20 mg/dL - 9 10  Creatinine 0.44 - 1.00 mg/dL - <0.30(L) 0.38(L)  Sodium 135 - 145 mmol/L - 138 134(L)  Potassium 3.5 - 5.1 mmol/L 3.1(L) 3.2(L) 3.1(L)  Chloride 101 - 111 mmol/L - 101 97(L)  CO2 22 - 32 mmol/L - 30 33(H)  Calcium 8.9 - 10.3 mg/dL - 7.3(L) 7.2(L)     Assessment: Pharmacy consulted to assist in the management of electrolytes and glucose in this 51 year old female being starting on parenteral nutrition. Patient extubated on 3/1.   Plan:  Continue Clinimix E 5/20 to 83 ml/hr with Trace elements and MVI along with lipids 20% @ 20 ml/hr for 12 hours daily per dietary's recommendations.   Ordered lasix 20mg  IV Q8H x 3 doses today (one dose has been given so far) potassium 33mEq IV x 2 and magnesium 2g IV x 2 was given today  K has dropped from 3.2 to 3.1. Will give an additional 30 MEQ IV x 2 (60 MEQ) and recheck with AM labs.   Has used 3 units of SSI in 24 hours, continue Q4H accuchecks  Will recheck electrolytes with am labs.   Thank you for this consult.  Ramond Dial, Pharm.D, BCPS Clinical Pharmacist  08/05/2016 6:12 PM

## 2016-08-05 NOTE — Progress Notes (Signed)
[facility] Progess Note    ASSESSMENT/PLAN   51 yo female presented abdominal pain found to have perforated ulcers with multiple abdominal abscesses complicated by severe septic shock now doing better and extubated.   PULMONARY A:Acute hypoxic respiratory failure-   Left loculated pleural effusion, s/p left pigtail chest tube placement 2/26-3/3;  Continued dyspnea today, with increased congestion on exam.  P:   Diuresis.   CARDIOVASCULAR A: Septic shock with hypotension>>Resolved Afib with RVR, started on amiodarone infusion 2/27, now improved.  P:   Can likely DC amiodarone, suspect that Afib was due to sepsis and hypotension.  Can not take PO, will continue metoprol prn for rate control as needed.   RENAL A:   Hypokalememia, Hypomagnesemia  P: Replace electrolytes per protocol Monitor I&O Follow BMP's  GASTROINTESTINAL/Surgical A:  Perforated ulcer s/p lap repair complicated by multiple abdominal abscesses.  P:   Continue protonix.  Follow Surgery recommendations, appreciate input   HEMATOLOGIC A:  Anemia   P:  Follow CBC's Monitor for S/Sx of bleeding Transfuse for Hgb <7 Lovenox & SCD's for VTE Prophylaxis  INFECTIOUS A:  Abdominal abscesses, s/p drainage. Leukocytosis is likely secondary, reactive.   P: Continue Zosyn Follow WBC's Monitor fever curve   Micro/culture results: BCx2 2/16; negative.  UC -- Sputum -- MRSA PCR 2/16; negative.  Pleural fluid culture 2/26; pending.   Procalcitonin 2.14>>0.84  Antibiotics: Zosyn 2/17>> plan on 2 week course.  Vancomycin, restarted per surgery on 3/4  ENDOCRINE A:  No acute issues>>Glucose Controlled  P: CBG q4h Continue SSI q4h Follow Hypo/Hyperglycemia Protocol  NEUROLOGIC A:  No acute issues  P: Provide supportive care Avoid sedating medications Prn xanax for anxiety and prn dilaudid for pain    MAJOR EVENTS/TEST RESULTS: 3/1: Extubated 2/26; washout of  abdominal abscess.  2/26; left chest tube placement.  2/21; IR placement of 12 Fr abscess drain.  2/16; Lap repair of perf gastric ulcers  Best Practices  DVT Prophylaxis: enoxaparin GI Prophylaxis: protonix.    ---------------------------------------   ----------------------------------------   Name: MARIDA PULIDO MRN: [id number] DOB: [birth year]    ADMISSION DATE:  07/20/2016   SUBJECTIVE:  Pt states she is feeling tired.  Review of Systems: Positives in BOLD Gen: Fever, chills, weight change, fatigue, night sweats HEENT: Blurred vision, double vision, hearing loss, tinnitus, sinus congestion, rhinorrhea, sore throat, neck stiffness, dysphagia PULM: Shortness of breath, cough, sputum production, hemoptysis, wheezing CV: Chest pain, edema, orthopnea, paroxysmal nocturnal dyspnea, palpitations GI: Abdominal pain, nausea, vomiting, diarrhea, hematochezia, melena, constipation, change in bowel habits GU: Dysuria, hematuria, polyuria, oliguria, urethral discharge Endocrine: Hot or cold intolerance, polyuria, polyphagia or appetite change Derm: Rash, dry skin, scaling or peeling skin change Heme: Easy bruising, bleeding, bleeding gums Neuro: Headache, numbness, weakness, slurred speech, loss of memory or consciousness    VITAL SIGNS: Temp:  [98.4 F (36.9 C)] 98.4 F (36.9 C) (03/04 0400) Pulse Rate:  [91-111] 106 (03/04 0700) Resp:  [25-40] 27 (03/04 0700) BP: (115-148)/(62-97) 126/97 (03/04 0700) SpO2:  [94 %-100 %] 99 % (03/04 0700) Weight:  [156 lb 4.9 oz (70.9 kg)] 156 lb 4.9 oz (70.9 kg) (03/04 0413) HEMODYNAMICS:   VENTILATOR SETTINGS:   INTAKE / OUTPUT:  Intake/Output Summary (Last 24 hours) at 08/05/16 0931 Last data filed at 08/05/16 0744  Gross per 24 hour  Intake          2139.27 ml  Output  5228 ml  Net         -3088.73 ml    PHYSICAL EXAMINATION: Physical Examination:   VS: BP (!) 126/97   Pulse (!) 106   Temp 98.4 F (36.9 C)  (Oral)   Resp (!) 27   Ht 5\' 10"  (1.778 m)   Wt 156 lb 4.9 oz (70.9 kg)   SpO2 99%   BMI 22.43 kg/m   General Appearance: Awake and alert, sitting in bed, feels well.  Neuro:without focal findings, pt awake and alert, follows commands.  HEENT: PERRLA, EOM intact. Pulmonary: Crackles on Left, decreased left sided air entry.   Cardiovascular: RRR, S1&S2 noted.  No m/r/g.   Abdomen: Non-distended, Soft, Tender. Renal:  No costovertebral tenderness  GU:  Not performed at this time. Endocrine: No evident thyromegaly. Skin:   warm, no rashes, no ecchymosis.  Midline abdominal incision, 2 JP drains to RLQ of abdomen and 1 JP drain to LLQ of abdomen  Extremities: normal, no cyanosis, clubbing.   LABS:   LABORATORY PANEL:   CBC  Recent Labs Lab 08/05/16 0500  WBC 27.8*  HGB 7.7*  HCT 24.7*  PLT 504*    Chemistries   Recent Labs Lab 08/03/16 0348  08/05/16 0500  NA 134*  < > 138  K 3.1*  < > 3.2*  CL 97*  < > 101  CO2 31  < > 30  GLUCOSE 127*  < > 142*  BUN 10  < > 9  CREATININE 0.31*  < > <0.30*  CALCIUM 7.2*  < > 7.3*  MG 1.7  < > 1.7  PHOS 4.0  --  3.3  AST 36  --   --   ALT 19  --   --   ALKPHOS 155*  --   --   BILITOT <0.1*  --   --   < > = values in this interval not displayed.   Recent Labs Lab 08/04/16 1205 08/04/16 1625 08/04/16 2002 08/04/16 2359 08/05/16 0400 08/05/16 0729  GLUCAP 119* 121* 106* 104* 126* 115*    Recent Labs Lab 07/30/16 0635 08/01/16 0440 08/02/16 0915  PHART 7.35 7.43 7.41  PCO2ART 40 47 51*  PO2ART 142* 71* 64*    Recent Labs Lab 07/30/16 0506 08/03/16 0348  AST  --  36  ALT  --  19  ALKPHOS  --  155*  BILITOT  --  <0.1*  ALBUMIN 1.6* 1.1*    Cardiac Enzymes No results for input(s): TROPONINI in the last 168 hours.  RADIOLOGY:  Dg Chest 1 View  Result Date: 08/04/2016 CLINICAL DATA:  Dyspnea. EXAM: CHEST 1 VIEW COMPARISON:  08/03/2016 FINDINGS: Right PICC terminates over the high right atrium,  unchanged. Enteric tube courses into the left upper abdomen with tip not imaged. Bilateral chest tubes remain in place. The cardiomediastinal silhouette is unchanged. Small bilateral pleural effusions have not significantly changed. A small amount of loculated gas is again noted in the basilar component of the left pleural effusion, unchanged. Pulmonary vascular congestion and bilateral airspace and interstitial lung opacities have not significantly changed. IMPRESSION: No interval change. Bilateral pleural effusions with chest tubes in place. Pulmonary edema with bibasilar atelectasis or consolidation. Electronically Signed   By: Logan Bores M.D.   On: 08/04/2016 07:07     -Marda Stalker, M.D.  08/05/2016

## 2016-08-05 NOTE — Progress Notes (Signed)
PHARMACY - ADULT TOTAL PARENTERAL NUTRITION CONSULT NOTE   Pharmacy Consult for TPN Indication: Perforated ulcer  Patient Measurements: Height: 5\' 10"  (177.8 cm) Weight: 157 lb 13.6 oz (71.6 kg) IBW/kg (Calculated) : 68.5 TPN AdjBW (KG): 58.8 Body mass index is 22.65 kg/m.  BMP Latest Ref Rng & Units 08/05/2016 08/04/2016 08/03/2016  Glucose 65 - 99 mg/dL 142(H) 157(H) -  BUN 6 - 20 mg/dL 9 10 -  Creatinine 0.44 - 1.00 mg/dL <0.30(L) 0.38(L) -  Sodium 135 - 145 mmol/L 138 134(L) -  Potassium 3.5 - 5.1 mmol/L 3.2(L) 3.1(L) 3.3(L)  Chloride 101 - 111 mmol/L 101 97(L) -  CO2 22 - 32 mmol/L 30 33(H) -  Calcium 8.9 - 10.3 mg/dL 7.3(L) 7.2(L) -     Assessment: Pharmacy consulted to assist in the management of electrolytes and glucose in this 51 year old female being starting on parenteral nutrition. Patient extubated on 3/1.   Plan:  Continue Clinimix E 5/20 to 83 ml/hr with Trace elements and MVI along with lipids 20% @ 20 ml/hr for 12 hours daily per dietary's recommendations.   Ordered lasix 20mg  IV Q8H x 3 doses today Will replace potassium 81mEq IV x 2 and magnesium 2g IV x 2.    Has used 3 units of SSI in 24 hours, continue Q4H accuchecks  Will recheck electrolytes with am labs.   Thank you for this consult.  Rexene Edison, PharmD, BCPS Clinical Pharmacist  08/05/2016 1:10 PM

## 2016-08-05 NOTE — Progress Notes (Signed)
Pharmacy Antibiotic Note  Lindsey Wolf is a 51 y.o. female admitted on 07/20/2016 with perforated ulcer s/p repair.   Pharmacy has been consulted for vancomycin dosing for infected midline incision. Patient is concurrently on fluconazole and Zosyn.   Plan: Vancomycin 1000 IV every 12 hours with stacked dosing. Trough scheduled with the 5th dose.   Goal trough 10-15 mcg/mL.  Height: 5\' 10"  (177.8 cm) Weight: 156 lb 4.9 oz (70.9 kg) IBW/kg (Calculated) : 68.5  Temp (24hrs), Avg:98.5 F (36.9 C), Min:98.4 F (36.9 C), Max:98.8 F (37.1 C)   Recent Labs Lab 07/30/16 0410  07/30/16 0655  08/01/16 0600 08/02/16 0515 08/03/16 0348 08/04/16 0525 08/05/16 0500  WBC 39.2*  --   --   < > 28.3* 28.3* 29.7* 24.5* 27.8*  CREATININE  --   < >  --   < > 0.46 0.31* 0.31* 0.38* <0.30*  LATICACIDVEN 1.4  --  1.3  --   --   --   --   --   --   < > = values in this interval not displayed.  CrCl cannot be calculated (This lab value cannot be used to calculate CrCl because it is not a number: <0.30).    No Known Allergies  Antimicrobials this admission: Zosyn 2/16 >>  Fluconazole 2/27 >>  Vancomycin 3/4 >>  Dose adjustments this admission:  Microbiology results 2/26 Pleural fluid cx: NG 2/21 Abscess: NG 2/16 BCx: NG 2/16 MRSA PCR: negative  Thank you for allowing pharmacy to be a part of this patient's care.  Ulice Dash D 08/05/2016 7:57 AM

## 2016-08-06 DIAGNOSIS — R6520 Severe sepsis without septic shock: Secondary | ICD-10-CM

## 2016-08-06 LAB — GLUCOSE, CAPILLARY
GLUCOSE-CAPILLARY: 101 mg/dL — AB (ref 65–99)
GLUCOSE-CAPILLARY: 120 mg/dL — AB (ref 65–99)
GLUCOSE-CAPILLARY: 121 mg/dL — AB (ref 65–99)
GLUCOSE-CAPILLARY: 92 mg/dL (ref 65–99)
Glucose-Capillary: 126 mg/dL — ABNORMAL HIGH (ref 65–99)
Glucose-Capillary: 72 mg/dL (ref 65–99)

## 2016-08-06 LAB — RENAL FUNCTION PANEL
Albumin: 1.2 g/dL — ABNORMAL LOW (ref 3.5–5.0)
Anion gap: 7 (ref 5–15)
BUN: 10 mg/dL (ref 6–20)
CHLORIDE: 103 mmol/L (ref 101–111)
CO2: 25 mmol/L (ref 22–32)
CREATININE: 0.31 mg/dL — AB (ref 0.44–1.00)
Calcium: 7.1 mg/dL — ABNORMAL LOW (ref 8.9–10.3)
Glucose, Bld: 122 mg/dL — ABNORMAL HIGH (ref 65–99)
POTASSIUM: 3.1 mmol/L — AB (ref 3.5–5.1)
Phosphorus: 3.5 mg/dL (ref 2.5–4.6)
Sodium: 135 mmol/L (ref 135–145)

## 2016-08-06 LAB — PHOSPHORUS: Phosphorus: 3.6 mg/dL (ref 2.5–4.6)

## 2016-08-06 LAB — BASIC METABOLIC PANEL
Anion gap: 4 — ABNORMAL LOW (ref 5–15)
BUN: 10 mg/dL (ref 6–20)
CHLORIDE: 102 mmol/L (ref 101–111)
CO2: 28 mmol/L (ref 22–32)
Calcium: 7.2 mg/dL — ABNORMAL LOW (ref 8.9–10.3)
Creatinine, Ser: 0.32 mg/dL — ABNORMAL LOW (ref 0.44–1.00)
GFR calc Af Amer: >60 mL/min (ref >60)
GFR calc non Af Amer: >60 mL/min (ref >60)
GLUCOSE: 132 mg/dL — AB (ref 65–99)
Potassium: 3.2 mmol/L — ABNORMAL LOW (ref 3.5–5.1)
SODIUM: 134 mmol/L — AB (ref 135–145)

## 2016-08-06 LAB — CBC
HCT: 23.1 % — ABNORMAL LOW (ref 35.0–47.0)
HEMOGLOBIN: 7.4 g/dL — AB (ref 12.0–16.0)
MCH: 24.2 pg — AB (ref 26.0–34.0)
MCHC: 31.9 g/dL — ABNORMAL LOW (ref 32.0–36.0)
MCV: 75.8 fL — ABNORMAL LOW (ref 80.0–100.0)
Platelets: 557 10*3/uL — ABNORMAL HIGH (ref 150–440)
RBC: 3.06 MIL/uL — AB (ref 3.80–5.20)
RDW: 25.6 % — ABNORMAL HIGH (ref 11.5–14.5)
WBC: 30.9 10*3/uL — ABNORMAL HIGH (ref 3.6–11.0)

## 2016-08-06 LAB — MAGNESIUM
MAGNESIUM: 1.7 mg/dL (ref 1.7–2.4)
MAGNESIUM: 1.9 mg/dL (ref 1.7–2.4)

## 2016-08-06 MED ORDER — PANTOPRAZOLE SODIUM 40 MG IV SOLR
40.0000 mg | Freq: Two times a day (BID) | INTRAVENOUS | Status: DC
  Administered 2016-08-06 – 2016-09-13 (×76): 40 mg via INTRAVENOUS
  Filled 2016-08-06 (×78): qty 40

## 2016-08-06 MED ORDER — LORAZEPAM 2 MG/ML IJ SOLN
1.0000 mg | INTRAMUSCULAR | Status: DC | PRN
  Administered 2016-08-06 – 2016-08-09 (×3): 1 mg via INTRAVENOUS
  Filled 2016-08-06 (×4): qty 1

## 2016-08-06 MED ORDER — PANTOPRAZOLE SODIUM 40 MG IV SOLR: 40.0000 mg | INTRAVENOUS | Status: DC

## 2016-08-06 MED ORDER — SODIUM CHLORIDE 0.9 % IV SOLN
30.0000 meq | Freq: Once | INTRAVENOUS | Status: AC
  Administered 2016-08-06: 30 meq via INTRAVENOUS
  Filled 2016-08-06: qty 15

## 2016-08-06 MED ORDER — LORAZEPAM 2 MG/ML IJ SOLN: 0.5000 mg | Freq: Four times a day (QID) | INTRAMUSCULAR | Status: DC | PRN

## 2016-08-06 MED ORDER — METOPROLOL TARTRATE 5 MG/5ML IV SOLN: 2.5000 mg | INTRAVENOUS | Status: DC | PRN

## 2016-08-06 MED ORDER — TRACE MINERALS CR-CU-MN-SE-ZN 10-1000-500-60 MCG/ML IV SOLN
INTRAVENOUS | Status: AC
  Administered 2016-08-06: 19:00:00 via INTRAVENOUS
  Filled 2016-08-06: qty 1992

## 2016-08-06 MED ORDER — MAGNESIUM SULFATE 2 GM/50ML IV SOLN
2.0000 g | Freq: Once | INTRAVENOUS | Status: AC
  Administered 2016-08-06: 2 g via INTRAVENOUS
  Filled 2016-08-06: qty 50

## 2016-08-06 MED ORDER — FAT EMULSION 20 % IV EMUL
250.0000 mL | INTRAVENOUS | Status: AC
  Administered 2016-08-06: 250 mL via INTRAVENOUS
  Filled 2016-08-06: qty 250

## 2016-08-06 NOTE — Clinical Social Work Note (Signed)
Clinical Social Work Assessment  Patient Details  Name: Lindsey Wolf MRN: [id number] Date of Birth: 10-Apr-1966  Date of referral:  08/06/16               Reason for consult:  Facility Placement                Permission sought to share information with:  Case Manager, Customer service manager, Family Supports Permission granted to share information::  Yes, Verbal Permission Granted  Name::        Agency::     Relationship::     Contact Information:     Housing/Transportation Living arrangements for the past 2 months:  [place] of Information:  Patient, Medical Team, Case Manager Patient Interpreter Needed:  None Criminal Activity/Legal Involvement Pertinent to Current Situation/Hospitalization:  No - Comment as needed Significant Relationships:  Other Family Members Lives with:  Other (Comment) (other family members) Do you feel safe going back to the place where you live?  Yes Need for family participation in patient care:  Yes (Comment)  Care giving concerns:  Patient reports prior to admission was working full time, living independently and this admission has set her back, however reports positive care supports and does not live alone.   Patient currently on TPN and going to move to the floor. She is sitting up in the chair, alert and oriented and reports she is not interested at this time to DC to SNF, but rather go home.  She would also like to work with physical therapy again as she feels better and feels she can do more than on her assessment 3/2.   Social Worker assessment / plan:  LCSW completed consult and explained role while in hospital. Patient open and receptive to services.  She explains she does feel better and understands she is weak, but planning to go back home at this time. She voices excitement that she is moving out of ICU in the next day and hopeful she can return home shortly.  Plan:  Will need physical therapy to return and  re-assess needs. Patient currently wanting to go home, reports caregivers and family available for needs. Will be available for assistance as patient progresses with PT and closer to DC.  Employment status:  Therapist, music:  Managed Care PT Recommendations:  [place] (will need to be reassessed) Information / Referral to community resources:  [place]  Patient/Family's Response to care:  Agreeable to plan  Patient/Family's Understanding of and Emotional Response to Diagnosis, Current Treatment, and Prognosis:  Patient aware of all her diagnoses and admission/current treatment. She is undecided about her prognosis with returning home, but hopeful it will be soon.  Emotional Assessment Appearance:  Appears stated age Attitude/Demeanor/Rapport:    Affect (typically observed):  Accepting, Adaptable, Overwhelmed Orientation:  Oriented to Self, Oriented to Place, Oriented to  Time, Oriented to Situation Alcohol / Substance use:  Not Applicable Psych involvement (Current and /or in the community):  No (Comment)  Discharge Needs  Concerns to be addressed:  No discharge needs identified Readmission within the last 30 days:  No Current discharge risk:  None Barriers to Discharge:  No Barriers Identified, Continued Medical Work up   [place], LCSW 08/06/2016, 1:42 PM

## 2016-08-06 NOTE — Progress Notes (Signed)
MD notified of patients increased confusion. Prn ativan given will continue to monitor.

## 2016-08-06 NOTE — Progress Notes (Signed)
PHARMACY - ADULT TOTAL PARENTERAL NUTRITION CONSULT NOTE   Pharmacy Consult for TPN/ electrolytes Indication: Perforated ulcer  Patient Measurements: Height: 5\' 10"  (177.8 cm) Weight: 157 lb 13.6 oz (71.6 kg) IBW/kg (Calculated) : 68.5 TPN AdjBW (KG): 58.8 Body mass index is 22.65 kg/m.  BMP Latest Ref Rng & Units 08/05/2016 08/05/2016 08/04/2016  Glucose 65 - 99 mg/dL - 142(H) 157(H)  BUN 6 - 20 mg/dL - 9 10  Creatinine 0.44 - 1.00 mg/dL - <0.30(L) 0.38(L)  Sodium 135 - 145 mmol/L - 138 134(L)  Potassium 3.5 - 5.1 mmol/L 3.1(L) 3.2(L) 3.1(L)  Chloride 101 - 111 mmol/L - 101 97(L)  CO2 22 - 32 mmol/L - 30 33(H)  Calcium 8.9 - 10.3 mg/dL - 7.3(L) 7.2(L)     Assessment:Pharmacy consulted to assist in the management of electrolytes and glucose in this 51 year old female being starting on parenteral nutrition. Patient extubated on 3/1.   Plan: Continue Clinimix E 5/20to 83 ml/hrwith Trace elements and MVI along with lipids 20% @ 20 ml/hr for 12 hours daily per dietary's recommendations.   Ordered lasix 20mg  IV Q8H x 3 doses today (one dose has been given so far) potassium 28mEq IV x 2 and magnesium 2g IV x 2 was given today  K has dropped from 3.2 to 3.1. Will give an additional 30 MEQ IV x 2 (60 MEQ) and recheck with AM labs.   Has used 3 units of SSI in 24 hours, continue Q4H accuchecks.  3/5 @ 0500 K+ 3.2, Mg 1.7, Ca 7.2 -- will replace: Mg 2g IV x 1, Kcl 30 mEq IV x 1 and will recheck labs w/ Calcium and albumin to assess corrected calcium 3/5 @ 0900.  Thank you for this consult.  Tobie Lords, PharmD, BCPS Clinical Pharmacist 08/06/2016

## 2016-08-06 NOTE — Progress Notes (Signed)
7 Days Post-Op  Subjective: Patient is status post repair of perforated ulcers with subsequent drainage for continued perforation. Today she is doing better in the ICU and is off of her drips. Care was discussed with nursing.  Objective: Vital signs in last 24 hours: Temp:  [97.6 F (36.4 C)-99 F (37.2 C)] 98.9 F (37.2 C) (03/05 0745) Pulse Rate:  [96-109] 109 (03/05 1200) Resp:  [26-38] 38 (03/05 1200) BP: (110-143)/(71-83) 129/82 (03/05 1200) SpO2:  [88 %-100 %] 93 % (03/05 1200) Weight:  [147 lb 11.3 oz (67 kg)] 147 lb 11.3 oz (67 kg) (03/05 0429) Last BM Date: 07/28/16  Intake/Output from previous day: 03/04 0701 - 03/05 0700 In: 3453.3 [I.V.:2288.3; IV Piggyback:1165] Out: [id number] [Urine:7375; Emesis/NG output:200; Drains:133] Intake/Output this shift: Total I/O In: 828 [I.V.:563; IV Piggyback:265] Out: 3000 [Urine:3000]  Physical exam:  Vital signs are reviewed And is soft and minimally distended wound is draining purulent material from the Penrose drain but the overall appearance of the wound externally is good with minimal if any erythema. Drains are functional with no sign of methylene blue and only serosanguineous fluid in the JP drains.  Lab Results: CBC   Recent Labs  08/05/16 0500 08/06/16 0510  WBC 27.8* 30.9*  HGB 7.7* 7.4*  HCT 24.7* 23.1*  PLT 504* 557*   BMET  Recent Labs  08/05/16 0500 08/05/16 1725 08/06/16 0510  NA 138  --  134*  K 3.2* 3.1* 3.2*  CL 101  --  102  CO2 30  --  28  GLUCOSE 142*  --  132*  BUN 9  --  10  CREATININE <0.30*  --  0.32*  CALCIUM 7.3*  --  7.2*   PT/INR No results for input(s): LABPROT, INR in the last 72 hours. ABG No results for input(s): PHART, HCO3 in the last 72 hours.  Invalid input(s): PCO2, PO2  Studies/Results: Dg Chest Port 1 View  Result Date: 08/05/2016 CLINICAL DATA:  Shortness of breath. EXAM: PORTABLE CHEST 1 VIEW COMPARISON:  08/04/2016 FINDINGS: Right PICC terminates over the right  atrium, unchanged. Enteric tube courses into the left upper abdomen with tip not imaged. Left pleural catheter has been removed. Right basilar chest tube versus subdiaphragmatic drain remains in place. A left upper quadrant abdominal drain is partially visualized. The cardiomediastinal silhouette is unchanged. There are persistent small pleural effusions, left larger than right and with the left-sided effusion extending towards the apex, not significantly changed. There is again the suggestion of a few small locules of gas in the basilar component of the left pleural effusion, unchanged. Airspace opacity in the left perihilar region has increased. Bibasilar airspace opacities have not significantly changed. Interstitial markings remain mildly increased diffusely. IMPRESSION: 1. Interval removal of left pleural catheter. Unchanged pleural effusions as above, partially loculated on the left. 2. Increased left perihilar opacity which may reflect worsening edema or pneumonia. Unchanged bibasilar airspace disease. Electronically Signed   By: Logan Bores M.D.   On: 08/05/2016 10:10    Anti-infectives: Anti-infectives    Start     Dose/Rate Route Frequency Ordered Stop   08/05/16 1500  vancomycin (VANCOCIN) IVPB 1000 mg/200 mL premix  Status:  Discontinued     1,000 mg 200 mL/hr over 60 Minutes Intravenous Every 12 hours 08/05/16 0753 08/06/16 0850   08/05/16 0800  vancomycin (VANCOCIN) IVPB 1000 mg/200 mL premix     1,000 mg 200 mL/hr over 60 Minutes Intravenous  Once 08/05/16 0753 08/05/16 [id number]  07/30/16 1815  fluconazole (DIFLUCAN) IVPB 400 mg     400 mg 100 mL/hr over 120 Minutes Intravenous Every 24 hours 07/30/16 1743     07/30/16 1528  piperacillin-tazobactam (ZOSYN) 3.375 GM/50ML IVPB    Comments:  LEWIS, CINDY: cabinet override      07/30/16 1528 07/30/16 1525   07/21/16 0000  piperacillin-tazobactam (ZOSYN) IVPB 3.375 g     3.375 g 12.5 mL/hr over 240 Minutes Intravenous Every 8 hours  07/20/16 1809     07/20/16 1430  piperacillin-tazobactam (ZOSYN) IVPB 3.375 g     3.375 g 100 mL/hr over 30 Minutes Intravenous  Once 07/20/16 1424 07/20/16 1603      Assessment/Plan: s/p Procedure(s): EXPLORATORY LAPAROTOMY drainage peritoneal abscess LYSIS OF ADHESION   White blood cell count remains elevated. She is draining purulent material from her Penrose drain but her wound seems to be healing well area JP drains are serosanguineous at this point and mostly serous without frank purulence. We'll continue drains a nasogastric tube. She is on TPN and nothing by mouth and will attempt to move her to the MedSurg floor this afternoon.  Florene Glen, MD, FACS  08/06/2016

## 2016-08-06 NOTE — Progress Notes (Signed)
Pt came with perforated ulcer and had intra abdominal infection, under surgical and ICU care. Now improved, and ready to transfer to floor, so ICU signed out to [place] to help with medical care. Will start that tomorrow.

## 2016-08-06 NOTE — Progress Notes (Addendum)
PT SUMMARY: Admitted 07/20/16 with abdominal pain and free air on KUB.  07/20/2016 CT abdomen: Extensive free intraperitoneal air and fluid compatible with perforated viscus 07/20/16 Laparotomy: Laparotomy with Repair of gastric ulcers x 2 using both omental and Falciform as a patch 07/25/2016 CT abdomen: Recently perforated gastric ulcers with extensive organized Large subcapsular hepatic abscess encompassing the right lobe. 9 cm lesser sac collection which contains leaked contrast from UGI 2 days ago.  6 cm left subdiaphragmatic abscess. Insinuating abscess in the ventral right lower quadrant peritoneum. Moderate pleural effusions and multi segment atelectasis. 07/25/16  CT IMAGE GUIDED DRAINAGE BY PERCUTANEOUS CATHETER  07/27/2016 CT abdomen: Persistent multifocal intra-abdominal fluid collections likely reflecting abscesses. The large right subcapsular hepatic abscess is similar to perhaps minimally improved despite percutaneous drain placement. Unchanged to slightly larger left subdiaphragmatic perisplenic abscess. Unchanged fluid, gas and extravasated contrast collection in the lesser sac. Unchanged insinuating right lower quadrant peritoneal abscess 07/30/16: Laparotomy for drainage or peritoneal abscess, lysis of adhesions 07/30/16: re-intubated post op for respiratory distress 07/30/16:  L chest tube placed for large L pleural effusion. Removed 08/04/16.  07/31/16: AFRVR (new onset). Resolved with amiodarone 08/01/2016 CT abdomen: Postsurgical changes are noted with multiple tubes and drains in place. The overall appearance of the intra-abdominal fluid collections has improved particularly in the region of the perihepatic fluid collection and subcapsular splenic fluid collection as well as the lesser sac fluid collection 08/02/16 extubated 08/06/16 Transfer to telemetry floor. [facility] to assume care beginning 03/06 (discussed with Dr Modena Nunnery)  SUBJ: Cognition intact. No new  complaints. No distress.  OBJ: Vitals:   08/06/16 0900 08/06/16 1000 08/06/16 1100 08/06/16 1200  BP: 124/82 127/83 127/76 129/82  Pulse: 100 96 97 (!) 109  Resp: (!) 28 (!) 27 (!) 29 (!) 38  Temp:      TempSrc:      SpO2: (!) 88% 97% 96% 93%  Weight:      Height:       [place] O2  HEENT:Within normal limits Neck: No jugular venous distention noted Chest: Clear anteriorly Cardiac: Regular without murmurs (sinus rhythm on monitor) Abdomen: Surgical dressing noted, multiple JP drains, mildly tender diffusely, active bowel sounds throughout Extremities: No edema, warm Neuro: No focal deficits, diffusely weak  BMP Latest Ref Rng & Units 08/06/2016 08/05/2016 08/05/2016  Glucose 65 - 99 mg/dL 132(H) - 142(H)  BUN 6 - 20 mg/dL 10 - 9  Creatinine 0.44 - 1.00 mg/dL 0.32(L) - <0.30(L)  Sodium 135 - 145 mmol/L 134(L) - 138  Potassium 3.5 - 5.1 mmol/L 3.2(L) 3.1(L) 3.2(L)  Chloride 101 - 111 mmol/L 102 - 101  CO2 22 - 32 mmol/L 28 - 30  Calcium 8.9 - 10.3 mg/dL 7.2(L) - 7.3(L)   CBC Latest Ref Rng & Units 08/06/2016 08/05/2016 08/04/2016  WBC 3.6 - 11.0 K/uL 30.9(H) 27.8(H) 24.5(H)  Hemoglobin 12.0 - 16.0 g/dL 7.4(L) 7.7(L) 7.5(L)  Hematocrit 35.0 - 47.0 % 23.1(L) 24.7(L) 23.8(L)  Platelets 150 - 440 K/uL 557(H) 504(H) 423   Results for orders placed or performed during the hospital encounter of 07/20/16  Chlamydia/NGC rt PCR ([place] only)     Status: None   Collection Time: 07/20/16  1:15 PM  Result Value Ref Range Status   Specimen source GC/Chlam ENDOCERVICAL  Final   Chlamydia Tr NOT DETECTED NOT DETECTED Final   N gonorrhoeae NOT DETECTED NOT DETECTED Final    Comment: (NOTE) 100  This methodology has not been evaluated in pregnant women  or in 200  patients with a history of hysterectomy. 300 400  This methodology will not be performed on patients less than 14  years of age.   Wet prep, genital     Status: Abnormal   Collection Time: 07/20/16  1:15 PM  Result Value Ref Range Status    Yeast Wet Prep HPF POC NONE SEEN NONE SEEN Final   Trich, Wet Prep NONE SEEN NONE SEEN Final   Clue Cells Wet Prep HPF POC NONE SEEN NONE SEEN Final   WBC, Wet Prep HPF POC RARE (A) NONE SEEN Final   Sperm NONE SEEN  Final  Blood culture (routine x 2)     Status: None   Collection Time: 07/20/16  3:07 PM  Result Value Ref Range Status   Specimen Description BLOOD left forearm  Final   Special Requests   Final    BOTTLES DRAWN AEROBIC AND ANAEROBIC AER12ML ANA12ML   Culture NO GROWTH 5 DAYS  Final   Report Status 07/25/2016 FINAL  Final  MRSA PCR Screening     Status: None   Collection Time: 07/20/16  9:29 PM  Result Value Ref Range Status   MRSA by PCR NEGATIVE NEGATIVE Final    Comment:        The GeneXpert MRSA Assay (FDA approved for NASAL specimens only), is one component of a comprehensive MRSA colonization surveillance program. It is not intended to diagnose MRSA infection nor to guide or monitor treatment for MRSA infections.   Aerobic/Anaerobic Culture (surgical/deep wound)     Status: None   Collection Time: 07/25/16  3:00 PM  Result Value Ref Range Status   Specimen Description ABSCESS RIGHT ABDOMEN  Final   Special Requests NONE  Final   Gram Stain   Final    RARE WBC PRESENT, PREDOMINANTLY PMN NO ORGANISMS SEEN    Culture   Final    No growth aerobically or anaerobically. Performed at [facility], [place] [address] [place], [place] [address]    Report Status 07/31/2016 FINAL  Final  Body fluid culture     Status: None   Collection Time: 07/30/16 11:40 AM  Result Value Ref Range Status   Specimen Description PLEURAL  Final   Special Requests NONE  Final   Gram Stain   Final    MODERATE WBC PRESENT, PREDOMINANTLY PMN NO ORGANISMS SEEN    Culture   Final    No growth aerobically or anaerobically. Performed at [facility], [place] [address] [place], [place] [address]    Report Status 08/03/2016 FINAL  Final  Acid Fast Smear (AFB)     Status:  None   Collection Time: 07/30/16 11:40 AM  Result Value Ref Range Status   AFB Specimen Processing Concentration  Final   Acid Fast Smear Negative  Final    Comment: (NOTE) Performed At: [facility] [place], [place] [id number] Lindon Romp MD [id number]    Source (AFB) PLEURAL  Final   Anti-infectives    Start     Dose/Rate Route Frequency Ordered Stop   08/05/16 1500  vancomycin (VANCOCIN) IVPB 1000 mg/200 mL premix  Status:  Discontinued     1,000 mg 200 mL/hr over 60 Minutes Intravenous Every 12 hours 08/05/16 0753 08/06/16 0850   08/05/16 0800  vancomycin (VANCOCIN) IVPB 1000 mg/200 mL premix     1,000 mg 200 mL/hr over 60 Minutes Intravenous  Once 08/05/16 0753 08/05/16 0923   07/30/16  1815  fluconazole (DIFLUCAN) IVPB 400 mg     400 mg 100 mL/hr over 120 Minutes Intravenous Every 24 hours 07/30/16 1743     07/30/16 1528  piperacillin-tazobactam (ZOSYN) 3.375 GM/50ML IVPB    Comments:  LEWIS, CINDY: cabinet override      07/30/16 1528 07/30/16 1525   07/21/16 0000  piperacillin-tazobactam (ZOSYN) IVPB 3.375 g     3.375 g 12.5 mL/hr over 240 Minutes Intravenous Every 8 hours 07/20/16 1809     07/20/16 1430  piperacillin-tazobactam (ZOSYN) IVPB 3.375 g     3.375 g 100 mL/hr over 30 Minutes Intravenous  Once 07/20/16 1424 07/20/16 1603      CXR (03/04): edema vs ALI pattern  IMPRESSION: 1) Acute hypoxic respiratory failure post-op - Oxygen requirements are improving. 2) edema versus acute lung injury pattern on chest x-ray from March 4 3) paroxysmal atrial fibrillation-resolved 4) hypokalemia 5) post laparotomy 2. Postoperative ileus 6) intraperitoneal abscesses Due to perforated viscus 7) severe sepsis, resolved 8) left pleural effusion-resolved 9) profound deconditioning  PLAN/REC: Transfer to telemetry bed.  Hospitalist service to assume medical care beginning March 6. Discussed with Dr. Modena Nunnery Postoperative management by  general surgery-she might require repeat laparotomy  If she returns to the intensive care unit postoperatively, PCCM will be happy to participate in her care Wean O2 to off as able Repeat CXR in AM 03/06 to follow up on pulmonary infiltrates Amiodarone DC'd - should not need it long term Monitor BMET intermittently Monitor I/Os Correct electrolytes as indicated Monitor temp, WBC count Micro and abx as above Vancomycin DC'd 03/05. Cont pip-tazo and fluconazole PT evaluation and treatment   Merton Border, MD PCCM service Mobile [phone] Pager [phone] 08/06/2016

## 2016-08-06 NOTE — Consult Note (Signed)
[facility] Infectious Disease     Reason for Consult Abdominal abscess   Referring Physician: Meryl Dare Date of Admission:  07/20/2016   Active Problems:   Perforated ulcer ([place])   Abdominal abscess ([place])   Abnormal respirations   Pleural effusion   Respiratory distress   HPI: Lindsey Wolf is a 52 y.o. female admitted with perforation of peptic ulcer on 2/16 and underwent emergent surgery with findings of purulent peritonitis. She was started on IV zosyn but had persistent leukocytosis and had Ct done 2.21 with findings of multiple abscesses.  Had IR drainage of RUQ abscess. Fu CT 2/23 had persistent fluid collections. Had repeat ex lap 2/26 with extensive adhesion and washout of multiple abscesses.  She was started on fluconazole 2/27. WBC has remained elevated but no fevers and she is out of the ICU today.  Cultures from 2/21 and 2/26 were negative. MRSA PCR negative.  She has R picc line in place and has been on TPN.    History reviewed. No pertinent past medical history. Past Surgical History:  Procedure Laterality Date  . LAPAROTOMY N/A 07/20/2016   Procedure: EXPLORATORY LAPAROTOMY;  Surgeon: Jules Husbands, MD;  Location: [facility] ORS;  Service: General;  Laterality: N/A;  . LAPAROTOMY N/A 07/30/2016   Procedure: EXPLORATORY LAPAROTOMY drainage peritoneal abscess;  Surgeon: Clayburn Pert, MD;  Location: [facility] ORS;  Service: General;  Laterality: N/A;  . LYSIS OF ADHESION  07/30/2016   Procedure: LYSIS OF ADHESION;  Surgeon: Clayburn Pert, MD;  Location: [facility] ORS;  Service: General;;   Social History  Substance Use Topics  . Smoking status: Current Every Day Smoker    Types: Cigarettes  . Smokeless tobacco: Never Used  . Alcohol use No   No family history on file.  Allergies: No Known Allergies  Current antibiotics: Antibiotics Given (last 72 hours)    Date/Time Action Medication Dose Rate   08/03/16 2255 Given   piperacillin-tazobactam (ZOSYN) IVPB 3.375 g 3.375 g  12.5 mL/hr   08/04/16 0509 Given   piperacillin-tazobactam (ZOSYN) IVPB 3.375 g 3.375 g 12.5 mL/hr   08/04/16 2253 Given   piperacillin-tazobactam (ZOSYN) IVPB 3.375 g 3.375 g 12.5 mL/hr   08/05/16 0649 Given   piperacillin-tazobactam (ZOSYN) IVPB 3.375 g 3.375 g 12.5 mL/hr   08/05/16 0272 Given   vancomycin (VANCOCIN) IVPB 1000 mg/200 mL premix 1,000 mg 200 mL/hr   08/05/16 1300 Given   piperacillin-tazobactam (ZOSYN) IVPB 3.375 g 3.375 g 12.5 mL/hr   08/05/16 1515 Given   vancomycin (VANCOCIN) IVPB 1000 mg/200 mL premix 1,000 mg 200 mL/hr   08/05/16 2122 Given   piperacillin-tazobactam (ZOSYN) IVPB 3.375 g 3.375 g 12.5 mL/hr   08/06/16 0209 Given   vancomycin (VANCOCIN) IVPB 1000 mg/200 mL premix 1,000 mg 200 mL/hr   08/06/16 0520 Given   piperacillin-tazobactam (ZOSYN) IVPB 3.375 g 3.375 g 12.5 mL/hr   08/06/16 1444 Given   piperacillin-tazobactam (ZOSYN) IVPB 3.375 g 3.375 g 12.5 mL/hr      MEDICATIONS: . enoxaparin (LOVENOX) injection  40 mg Subcutaneous Q24H  . fluconazole (DIFLUCAN) IV  400 mg Intravenous Q24H  . insulin aspart  0-9 Units Subcutaneous Q4H  . pantoprazole (PROTONIX) IV  40 mg Intravenous Q12H  . piperacillin-tazobactam (ZOSYN)  IV  3.375 g Intravenous Q8H  . potassium chloride (KCL MULTIRUN) 30 mEq in 265 mL IVPB  30 mEq Intravenous Once  . sodium chloride flush  10-40 mL Intracatheter Q12H    Review of Systems - 11  systems reviewed and negative per HPI   OBJECTIVE: Temp:  [97.6 F (36.4 C)-99 F (37.2 C)] 98.9 F (37.2 C) (03/05 0745) Pulse Rate:  [96-109] 101 (03/05 1300) Resp:  [26-38] 31 (03/05 1400) BP: (110-143)/(71-83) 126/74 (03/05 1400) SpO2:  [88 %-100 %] 97 % (03/05 1300) Weight:  [67 kg (147 lb 11.3 oz)] 67 kg (147 lb 11.3 oz) (03/05 0429) Physical Exam  Constitutional:  Ill appearing HENT: [place]/AT, PERRLA, no scleral icterus Mouth/Throat: Oropharynx is clear and dry. No oropharyngeal exudate.  Cardiovascular: Normal rate, regular  rhythm and normal heart sounds.  Pulmonary/Chest: Effort normal and breath sounds normal. No respiratory distress.  has no wheezes.  Neck = supple, no nuchal rigidity Abdominal: Soft. abd mildly distended, inciision along midline with mild erythema, penrose drain at lower portion with thin drainage 3 JP drains in place, 2 on R and 1 on L with ss drainage. abd mildly tender Lymphadenopathy: no cervical adenopathy. No axillary adenopathy Neurological: alert and oriented to person, place, and time.  Skin: Skin is warm and dry. No rash noted. No erythema.  PICC RUE wnl Foley cath in place Psychiatric: a normal mood and affect.  behavior is normal.    LABS: Results for orders placed or performed during the hospital encounter of 07/20/16 (from the past 48 hour(s))  Glucose, capillary     Status: Abnormal   Collection Time: 08/04/16  4:25 PM  Result Value Ref Range   Glucose-Capillary 121 (H) 65 - 99 mg/dL  Glucose, capillary     Status: Abnormal   Collection Time: 08/04/16  8:02 PM  Result Value Ref Range   Glucose-Capillary 106 (H) 65 - 99 mg/dL  Glucose, capillary     Status: Abnormal   Collection Time: 08/04/16 11:59 PM  Result Value Ref Range   Glucose-Capillary 104 (H) 65 - 99 mg/dL  Glucose, capillary     Status: Abnormal   Collection Time: 08/05/16  4:00 AM  Result Value Ref Range   Glucose-Capillary 126 (H) 65 - 99 mg/dL  Magnesium     Status: None   Collection Time: 08/05/16  5:00 AM  Result Value Ref Range   Magnesium 1.7 1.7 - 2.4 mg/dL  Phosphorus     Status: None   Collection Time: 08/05/16  5:00 AM  Result Value Ref Range   Phosphorus 3.3 2.5 - 4.6 mg/dL  Basic metabolic panel     Status: Abnormal   Collection Time: 08/05/16  5:00 AM  Result Value Ref Range   Sodium 138 135 - 145 mmol/L   Potassium 3.2 (L) 3.5 - 5.1 mmol/L   Chloride 101 101 - 111 mmol/L   CO2 30 22 - 32 mmol/L   Glucose, Bld 142 (H) 65 - 99 mg/dL   BUN 9 6 - 20 mg/dL   Creatinine, Ser <0.30  (L) 0.44 - 1.00 mg/dL   Calcium 7.3 (L) 8.9 - 10.3 mg/dL   GFR calc non Af Amer NOT CALCULATED >60 mL/min   GFR calc Af Amer NOT CALCULATED >60 mL/min    Comment: (NOTE) The eGFR has been calculated using the CKD EPI equation. This calculation has not been validated in all clinical situations. eGFR's persistently <60 mL/min signify possible Chronic Kidney Disease.    Anion gap 7 5 - 15  CBC     Status: Abnormal   Collection Time: 08/05/16  5:00 AM  Result Value Ref Range   WBC 27.8 (H) 3.6 - 11.0 K/uL   RBC 3.27 (L)  3.80 - 5.20 MIL/uL   Hemoglobin 7.7 (L) 12.0 - 16.0 g/dL   HCT 24.7 (L) 35.0 - 47.0 %   MCV 75.5 (L) 80.0 - 100.0 fL   MCH 23.6 (L) 26.0 - 34.0 pg   MCHC 31.2 (L) 32.0 - 36.0 g/dL   RDW 25.9 (H) 11.5 - 14.5 %   Platelets 504 (H) 150 - 440 K/uL  Glucose, capillary     Status: Abnormal   Collection Time: 08/05/16  7:29 AM  Result Value Ref Range   Glucose-Capillary 115 (H) 65 - 99 mg/dL  Glucose, capillary     Status: Abnormal   Collection Time: 08/05/16 11:25 AM  Result Value Ref Range   Glucose-Capillary 126 (H) 65 - 99 mg/dL  Glucose, capillary     Status: Abnormal   Collection Time: 08/05/16  3:51 PM  Result Value Ref Range   Glucose-Capillary 123 (H) 65 - 99 mg/dL  Potassium     Status: Abnormal   Collection Time: 08/05/16  5:25 PM  Result Value Ref Range   Potassium 3.1 (L) 3.5 - 5.1 mmol/L  Glucose, capillary     Status: Abnormal   Collection Time: 08/05/16  7:27 PM  Result Value Ref Range   Glucose-Capillary 116 (H) 65 - 99 mg/dL  Glucose, capillary     Status: Abnormal   Collection Time: 08/06/16 12:02 AM  Result Value Ref Range   Glucose-Capillary 121 (H) 65 - 99 mg/dL  Glucose, capillary     Status: Abnormal   Collection Time: 08/06/16  3:56 AM  Result Value Ref Range   Glucose-Capillary 126 (H) 65 - 99 mg/dL  Basic metabolic panel     Status: Abnormal   Collection Time: 08/06/16  5:10 AM  Result Value Ref Range   Sodium 134 (L) 135 - 145  mmol/L   Potassium 3.2 (L) 3.5 - 5.1 mmol/L   Chloride 102 101 - 111 mmol/L   CO2 28 22 - 32 mmol/L   Glucose, Bld 132 (H) 65 - 99 mg/dL   BUN 10 6 - 20 mg/dL   Creatinine, Ser 0.32 (L) 0.44 - 1.00 mg/dL   Calcium 7.2 (L) 8.9 - 10.3 mg/dL   GFR calc non Af Amer >60 >60 mL/min   GFR calc Af Amer >60 >60 mL/min    Comment: (NOTE) The eGFR has been calculated using the CKD EPI equation. This calculation has not been validated in all clinical situations. eGFR's persistently <60 mL/min signify possible Chronic Kidney Disease.    Anion gap 4 (L) 5 - 15  Magnesium     Status: None   Collection Time: 08/06/16  5:10 AM  Result Value Ref Range   Magnesium 1.7 1.7 - 2.4 mg/dL  Phosphorus     Status: None   Collection Time: 08/06/16  5:10 AM  Result Value Ref Range   Phosphorus 3.6 2.5 - 4.6 mg/dL  CBC     Status: Abnormal   Collection Time: 08/06/16  5:10 AM  Result Value Ref Range   WBC 30.9 (H) 3.6 - 11.0 K/uL   RBC 3.06 (L) 3.80 - 5.20 MIL/uL   Hemoglobin 7.4 (L) 12.0 - 16.0 g/dL   HCT 23.1 (L) 35.0 - 47.0 %   MCV 75.8 (L) 80.0 - 100.0 fL   MCH 24.2 (L) 26.0 - 34.0 pg   MCHC 31.9 (L) 32.0 - 36.0 g/dL   RDW 25.6 (H) 11.5 - 14.5 %   Platelets 557 (H) 150 - 440 K/uL  Glucose, capillary     Status: Abnormal   Collection Time: 08/06/16  7:21 AM  Result Value Ref Range   Glucose-Capillary 101 (H) 65 - 99 mg/dL  Glucose, capillary     Status: None   Collection Time: 08/06/16 12:01 PM  Result Value Ref Range   Glucose-Capillary 92 65 - 99 mg/dL  Renal function panel     Status: Abnormal   Collection Time: 08/06/16 12:11 PM  Result Value Ref Range   Sodium 135 135 - 145 mmol/L   Potassium 3.1 (L) 3.5 - 5.1 mmol/L   Chloride 103 101 - 111 mmol/L   CO2 25 22 - 32 mmol/L   Glucose, Bld 122 (H) 65 - 99 mg/dL   BUN 10 6 - 20 mg/dL   Creatinine, Ser 0.31 (L) 0.44 - 1.00 mg/dL   Calcium 7.1 (L) 8.9 - 10.3 mg/dL   Phosphorus 3.5 2.5 - 4.6 mg/dL   Albumin 1.2 (L) 3.5 - 5.0 g/dL   GFR  calc non Af Amer >60 >60 mL/min   GFR calc Af Amer >60 >60 mL/min    Comment: (NOTE) The eGFR has been calculated using the CKD EPI equation. This calculation has not been validated in all clinical situations. eGFR's persistently <60 mL/min signify possible Chronic Kidney Disease.    Anion gap 7 5 - 15  Magnesium     Status: None   Collection Time: 08/06/16 12:11 PM  Result Value Ref Range   Magnesium 1.9 1.7 - 2.4 mg/dL   No components found for: ESR, C REACTIVE PROTEIN MICRO: Recent Results (from the past 720 hour(s))  Chlamydia/NGC rt PCR ([facility] only)     Status: None   Collection Time: 07/20/16  1:15 PM  Result Value Ref Range Status   Specimen source GC/Chlam ENDOCERVICAL  Final   Chlamydia Tr NOT DETECTED NOT DETECTED Final   N gonorrhoeae NOT DETECTED NOT DETECTED Final    Comment: (NOTE) 100  This methodology has not been evaluated in pregnant women or in 200  patients with a history of hysterectomy. 300 400  This methodology will not be performed on patients less than 31  years of age.   Wet prep, genital     Status: Abnormal   Collection Time: 07/20/16  1:15 PM  Result Value Ref Range Status   Yeast Wet Prep HPF POC NONE SEEN NONE SEEN Final   Trich, Wet Prep NONE SEEN NONE SEEN Final   Clue Cells Wet Prep HPF POC NONE SEEN NONE SEEN Final   WBC, Wet Prep HPF POC RARE (A) NONE SEEN Final   Sperm NONE SEEN  Final  Blood culture (routine x 2)     Status: None   Collection Time: 07/20/16  3:07 PM  Result Value Ref Range Status   Specimen Description BLOOD left forearm  Final   Special Requests   Final    BOTTLES DRAWN AEROBIC AND ANAEROBIC AER12ML ANA12ML   Culture NO GROWTH 5 DAYS  Final   Report Status 07/25/2016 FINAL  Final  MRSA PCR Screening     Status: None   Collection Time: 07/20/16  9:29 PM  Result Value Ref Range Status   MRSA by PCR NEGATIVE NEGATIVE Final    Comment:        The GeneXpert MRSA Assay (FDA approved for NASAL specimens only), is one  component of a comprehensive MRSA colonization surveillance program. It is not intended to diagnose MRSA infection nor to guide or monitor treatment for  MRSA infections.   Aerobic/Anaerobic Culture (surgical/deep wound)     Status: None   Collection Time: 07/25/16  3:00 PM  Result Value Ref Range Status   Specimen Description ABSCESS RIGHT ABDOMEN  Final   Special Requests NONE  Final   Gram Stain   Final    RARE WBC PRESENT, PREDOMINANTLY PMN NO ORGANISMS SEEN    Culture   Final    No growth aerobically or anaerobically. Performed at [facility], [place] [address], [place], [place] [address]    Report Status 07/31/2016 FINAL  Final  Body fluid culture     Status: None   Collection Time: 07/30/16 11:40 AM  Result Value Ref Range Status   Specimen Description PLEURAL  Final   Special Requests NONE  Final   Gram Stain   Final    MODERATE WBC PRESENT, PREDOMINANTLY PMN NO ORGANISMS SEEN    Culture   Final    No growth aerobically or anaerobically. Performed at [facility], [place] [address], [place], [place] [address]    Report Status 08/03/2016 FINAL  Final  Acid Fast Smear (AFB)     Status: None   Collection Time: 07/30/16 11:40 AM  Result Value Ref Range Status   AFB Specimen Processing Concentration  Final   Acid Fast Smear Negative  Final    Comment: (NOTE) Performed At: [facility] [place], [place] [id number] Lindon Romp MD ON:[id number]    Source (AFB) PLEURAL  Final    IMAGING: Dg Chest 1 View  Result Date: 08/04/2016 CLINICAL DATA:  Dyspnea. EXAM: CHEST 1 VIEW COMPARISON:  08/03/2016 FINDINGS: Right PICC terminates over the high right atrium, unchanged. Enteric tube courses into the left upper abdomen with tip not imaged. Bilateral chest tubes remain in place. The cardiomediastinal silhouette is unchanged. Small bilateral pleural effusions have not significantly changed. A small amount of loculated gas is again noted in the  basilar component of the left pleural effusion, unchanged. Pulmonary vascular congestion and bilateral airspace and interstitial lung opacities have not significantly changed. IMPRESSION: No interval change. Bilateral pleural effusions with chest tubes in place. Pulmonary edema with bibasilar atelectasis or consolidation. Electronically Signed   By: Logan Bores M.D.   On: 08/04/2016 07:07   Dg Chest 1 View  Result Date: 08/03/2016 CLINICAL DATA:  Shortness of Breath EXAM: CHEST 1 VIEW COMPARISON:  August 02, 2016 FINDINGS: Endotracheal tube has been removed. Central catheter tip is at the cavoatrial junction. Nasogastric tube tip and side port are below the diaphragm. There are chest tubes bilaterally. No evident pneumothorax. There is interstitial edema bilaterally, slightly less than on 1 day prior. Edema is primarily perihilar in location on both sides. There is airspace consolidation in left base medially. There are pleural effusions bilaterally with a degree of loculated effusion on the left. There is air within this loculated effusion in the lateral left base, stable. There is mild cardiomegaly with pulmonary venous hypertension. No adenopathy evident. No bone lesions. IMPRESSION: Tube and catheter positions as described. No evident pneumothorax. Findings consistent with congestive heart failure. Superimposed pneumonia in the left base cannot be excluded radiographically. Note that there is loculated effusion on the left. A small amount of air within this fluid is noted laterally on the left, stable. Overall, there is slightly less interstitial edema compared to 1 day prior. Electronically Signed   By: Lowella Grip III M.D.   On: 08/03/2016 07:09   Dg Chest 1  View  Result Date: 08/02/2016 CLINICAL DATA:  Acute onset of dyspnea.  Initial encounter. EXAM: CHEST 1 VIEW COMPARISON:  Chest radiograph performed 08/01/2016 FINDINGS: The patient's endotracheal tube is seen ending 4-5 cm above the carina. A  right PICC is noted ending about the cavoatrial junction. The lungs are well-aerated. Small to moderate bilateral pleural effusions are noted. Patchy bilateral airspace opacities may reflect pulmonary edema or pneumonia, perhaps slightly worsened from the prior study. No pneumothorax is seen. The cardiomediastinal silhouette is borderline normal in size. No acute osseous abnormalities are seen. Drains are noted overlying the upper quadrants. IMPRESSION: 1. Endotracheal tube seen ending 4-5 cm above the carina. 2. Small bilateral pleural effusions noted. Patchy bilateral airspace opacification may reflect pulmonary edema or pneumonia, perhaps slightly worsened from the prior study. Electronically Signed   By: Garald Balding M.D.   On: 08/02/2016 04:00   Dg Chest 1 View  Result Date: 08/01/2016 CLINICAL DATA:  Dyspnea EXAM: CHEST 1 VIEW COMPARISON:  07/31/2016 FINDINGS: Endotracheal tube, nasogastric catheter and right-sided PICC line are again seen and stable. Small bore left chest tube is again identified. Bilateral pleural effusions are again seen similar to those noted on the prior CT examination. Surgical drains are noted in the right upper quadrant. No pneumothorax is seen. The loculated component of the left pleural effusion is not well appreciated on this exam. IMPRESSION: Tubes and lines as described above. The overall appearance is stable from the prior study Electronically Signed   By: Inez Catalina M.D.   On: 08/01/2016 07:31   Dg Chest 1 View  Result Date: 07/31/2016 CLINICAL DATA:  Status post abdominal surgery for adhesions; laparotomy also on July 20, 2016 for perforated ulcer. Current smoker EXAM: CHEST 1 VIEW COMPARISON:  Portable chest x-ray of July 30, 2016 FINDINGS: The lungs are adequately inflated. Small bilateral pleural effusions are present greatest on the left. The interstitial markings are increased. Bibasilar densities persist. The heart is normal in size. The pulmonary  vascularity is indistinct. The endotracheal tube tip projects 5.5 cm above the carina. The esophagogastric tube tip projects below the inferior margin of the image. The right-sided PICC line tip projects over the cavoatrial junction. The small caliber left-sided chest tube is in stable position with the tip projecting just above the left lateral costophrenic gutter. Surgical drainage tubes are present within the upper abdomen. IMPRESSION: Slight interval improvement in the appearance of the chest with decreased pleural fluid volume on the left. No pneumothorax. Persistent bibasilar atelectasis or infiltrate. Mild central pulmonary vascular congestion. Electronically Signed   By: Alexandrina Fiorini  [place] M.D.   On: 07/31/2016 07:08   Dg Chest 1 View  Result Date: 07/30/2016 CLINICAL DATA:  Right-sided chest tube. EXAM: CHEST 1 VIEW COMPARISON:  Earlier the same day FINDINGS: 1222 hours. Endotracheal tube tip 5.2 cm above the base the carina. NG tube tip positioned in the proximal stomach with proximal port just distal to the esophagogastric junction. Small bore left chest tube is new in the interval. Interval decrease in left pleural effusion without evidence for left pneumothorax. Right PICC line tip projects in the upper mid right atrium. Bibasilar collapse/consolidation noted with bilateral pleural effusions, left greater than right. Loculated component to the left effusion is suspected. Telemetry leads overlie the chest. Surgical drains overlie the left upper quadrant the abdomen. IMPRESSION: Interval decrease in left pleural effusion status post left pleural drain placement. Electronically Signed   By: Misty Stanley M.D.   On: 07/30/2016 12:43  Dg Abd 1 View  Result Date: 07/21/2016 CLINICAL DATA:  NG tube placement EXAM: ABDOMEN - 1 VIEW COMPARISON:  CT abdomen and pelvis 07/20/2016.  Abdomen 07/20/2016. FINDINGS: An enteric tube has been placed with tip in the left upper quadrant consistent with location in the  body of the stomach. Postoperative changes in the mid abdomen with skin clips along the midline and to intra-abdominal drains placed. Scattered gas demonstrated in the colon. IMPRESSION: NG tube tip in the left upper quadrant consistent with location in the body of the stomach. Surgical drains are present. Electronically Signed   By: Lucienne Capers M.D.   On: 07/21/2016 03:56   Ct Abdomen Pelvis W Contrast  Result Date: 08/01/2016 CLINICAL DATA:  History of complicated peptic ulcer disease repair with persistent intra-abdominal fluid collections EXAM: CT ABDOMEN AND PELVIS WITH CONTRAST TECHNIQUE: Multidetector CT imaging of the abdomen and pelvis was performed using the standard protocol following bolus administration of intravenous contrast. CONTRAST:  158m ISOVUE-300 IOPAMIDOL (ISOVUE-300) INJECTION 61% COMPARISON:  07/27/2016 FINDINGS: Lower chest: Bibasilar atelectatic changes are noted stable from the prior exam. Bilateral pleural effusions are seen right greater than left which are slightly improved when compared with the prior exam. Small bore left chest tube is noted. There is a new loculated component of the left pleural effusion noted along the anterolateral cardiac margin. Hepatobiliary: Liver is again well visualized with a cyst within the medial segment of the left lobe of the liver stable from the prior exam. Gallbladder remains well distended. The previously seen perihepatic fluid collection has reduced significantly with removal of previously placed percutaneous drain and placement of a large bore surgical drain along the dome of the liver. Pancreas: Unremarkable. No pancreatic ductal dilatation or surrounding inflammatory changes. Spleen: The previously seen subcapsular fluid collection along the medial aspect of the spleen has reduced in size now measuring 6 cm in greatest dimension with reduction in air component within. The remaining spleen enhances in a normal fashion. Adrenals/Urinary  Tract: Adrenal glands are unremarkable. Kidneys are normal, without renal calculi, focal lesion, or hydronephrosis. Bladder is well distended despite placement of a Foley catheter. Clinical correlation is recommended. Stomach/Bowel: Nasogastric catheter is noted within the stomach. The stomach is decompressed. The previously seen retrogastric lesser sac collection has reduced significantly with only a minimal amount of fluid adjacent to the tip of surgically placed drain best seen on image number 31 of series 2. This measures approximately 2.4 cm in greatest dimension. A small collection is noted along the tip of the left lobe of the liver and extending along the anterior aspect of the stomach measuring approximately 7.4 by 2.0 cm. This collection was not present on the prior exam and possibly could be related to some retained fluid from the recent surgeries. The third surgically placed drain is noted along the left pericolic gutter and spleen. Contrast is noted within the collapsed colon. Vascular/Lymphatic: Aortic atherosclerosis. No enlarged abdominal or pelvic lymph nodes. Reproductive: Uterus and bilateral adnexa are unremarkable. Other: Minimal free pelvic fluid is noted which may be related to the recent surgery. A previously low seen bilobed collection noted just beneath the abdominal wall on the right anteriorly is again identified and has significantly reduced in size. It now measures approximately 7.6 cm by 2 cm. It previously measured 12 x 2.3 cm. A small collection in the left hemipelvis is again identified measuring 2.6 cm decreased in size from 3.1 cm. A Penrose drain is noted in the anterior abdominal  wall in an area of prior subcutaneous fluid collection. The anterior abdominal wall changes consistent with the recent surgery are noted. Mild changes of anasarca are noted in the subcutaneous tissues. Musculoskeletal: No acute bony abnormality is noted. IMPRESSION: Postsurgical changes are noted with  multiple tubes and drains in place. The overall appearance of the intra-abdominal fluid collections has improved particularly in the region of the perihepatic fluid collection and subcapsular splenic fluid collection as well as the lesser sac fluid collection. Reduction in an a right anterior intra-abdominal collection is seen although a new small collection is noted along the tip of the left lobe of the liver and anterior aspect of the stomach as described. It is possible this new small collection represent some retained fluid from the recent surgery. Bilateral pleural effusions which have improved from the prior exam right greater than left. A small bore left chest tube is noted in place. The left effusion demonstrates a somewhat more loculated component along the anteromedial margin of the mediastinum and heart. Nasogastric catheter remains in a predominately decompressed stomach no definitive perforation to account for the methylene blue passage into the surgical drain is noted although no contrast was administered within the stomach for this exam. Electronically Signed   By: Inez Catalina M.D.   On: 08/01/2016 07:30   Ct Abdomen Pelvis W Contrast  Result Date: 07/27/2016 CLINICAL DATA:  51 year old female with a history of gastric perforation complicated by intra- abdominal abscesses. Most recently, a 41 French drainage catheter was placed into the right upper quadrant fluid collection. EXAM: CT ABDOMEN AND PELVIS WITH CONTRAST TECHNIQUE: Multidetector CT imaging of the abdomen and pelvis was performed using the standard protocol following bolus administration of intravenous contrast. CONTRAST:  70m ISOVUE-300 IOPAMIDOL (ISOVUE-300) INJECTION 61% COMPARISON:  Most recent prior CT scan of the abdomen and pelvis 07/25/2016 FINDINGS: Lower chest: Moderately large bilateral layering pleural effusions with associated compressive atelectasis of the lower lobes. The heart remains normal in size. No pericardial  effusion. A nasogastric tube is present. Hepatobiliary: Persistent large subcapsular fluid collection which is similar to slightly smaller in volume compared to 07/25/2016. Air is again noted within the fissure for the sinus venosus. Sludge versus contrast material layers dependently within the gallbladder. Simple hepatic cyst remains unchanged. Pancreas: No focal mass lesion or inflammatory change. A large fluid, gas and contrast collection in the lesser sac exerts some local mass effect on the pancreas. Spleen: Subcapsular splenic fluid and gas collection is similar to slightly enlarged measuring 7.4 x 4.9 cm compared to approximately 6.7 by 4.8 cm. Adrenals/Urinary Tract: Normal adrenal glands and kidneys. No hydronephrosis or stones. No enhancing renal mass. Stomach/Bowel: Edematous Ruby a throughout the stomach consistent with inflammatory change. Fluid, gas and contrast collection in the lesser sac displacing the stomach anteriorly in the pancreas posteriorly. This is essentially unchanged at 9.6 x 5.2 cm compared to 9.2 x 5.5 cm previously. The tip contrast material is present throughout the colon. No evidence of a bowel obstruction. Loculated fluid just deep to the anterior abdominal wall overlying the colon and small bowel in the right lower quadrant measures approximately 12.1 x 2.4 cm which is unchanged compared to prior. Vascular/Lymphatic: Scattered atherosclerotic plaque including within the abdominal aorta. No suspicious adenopathy. Reproductive: Uterus and bilateral adnexa are unremarkable. Other: Developing subincisional fluid collection in the anterior abdominal wall measuring up to 2.9 cm in width and 8.5 cm in length. Extensive body wall edema consistent with anasarca 0 or third-spacing. Two surgical  drains are present within the abdomen via right lower and left lower quadrant approaches. There is a percutaneous drainage catheter in the right subhepatic fluid collection as well. Musculoskeletal:  No acute or significant osseous findings. IMPRESSION: 1. Persistent multifocal intra-abdominal fluid collections likely reflecting abscesses. The large right subcapsular hepatic abscess is similar to perhaps minimally improved despite percutaneous drain placement. Unchanged to slightly larger left subdiaphragmatic perisplenic abscess. Unchanged fluid, gas and extravasated contrast collection in the lesser sac. Unchanged insinuating right lower quadrant peritoneal abscess. 2. Developing subincisional fluid collection in the anterior abdominal wall concerning for seroma formation versus leakage of intra-abdominal ascites. 3. Body wall edema/anasarca 4. Stable bilateral moderate layering pleural effusions with associated lower lobe atelectasis. Electronically Signed   By: Jacqulynn Cadet M.D.   On: 07/27/2016 13:58   Ct Abdomen Pelvis W Contrast  Result Date: 07/25/2016 CLINICAL DATA:  Post operative abscess. EXAM: CT ABDOMEN AND PELVIS WITH CONTRAST TECHNIQUE: Multidetector CT imaging of the abdomen and pelvis was performed using the standard protocol following bolus administration of intravenous contrast. CONTRAST:  149m ISOVUE-300 IOPAMIDOL (ISOVUE-300) INJECTION 61% COMPARISON:  07/10/2016 FINDINGS: Multifocal organized fluid collection/abscess. Subcapsular gas and fluid collection along the right and posterior aspect of the right liver measuring up to 4.4 cm in thickness and 18 cm in length. This likely communicates with fluid in the liver hilum tracking superiorly along the caudate and into the lesser sac, which is expanded by fluid, gas, and previously extravasated oral contrast. The lesser sac collection measures 9 cm in diameter. There is a discrete pocket of gas and fluid with internal contrast material at the medial spleen measuring 6 cm in diameter. Contiguous collection of rim enhancing peritoneal fluid and gas along the ventral right lower quadrant measuring up to 46 mm in thickness, tracking into  the interloop spaces. Ovoid cystic structure along the left pelvic side wall is likely peritoneal, less likely follicle. Surgical drains are present, but not in continuity with the largest collections Lower chest: Moderate layering pleural effusions with multi segment atelectasis. No suspicious pleural enhancement. Hepatobiliary:Negative biliary tree. Pancreas: Unremarkable. Spleen: Unremarkable. Adrenals/Urinary Tract: Negative adrenals. No hydronephrosis or stone. Unremarkable bladder. Stomach/Bowel: Sequela of gastric perforation described above. Postoperative stomach with 2 visible ulcers along the greater curvature at the antrum and bulb. No obstruction or ileus. Reactive submucosal edema in the proximal colon where contiguous with fluid collections. Gastric submucosal edema greatest at the fundus. Vascular/Lymphatic: No acute vascular abnormality. No mass or adenopathy. Reproductive:No pathologic findings. Other: Body wall edema. Recent laparotomy. No abdominal wall abscess. Musculoskeletal: No acute abnormalities. IMPRESSION: 1. Recently perforated gastric ulcers with extensive organized collection/abscess. 2. Large subcapsular hepatic abscess encompassing the right lobe. 3. 9 cm lesser sac collection which contains leaked contrast from UGI 2 days ago. 4. 6 cm left subdiaphragmatic abscess. 5. Insinuating abscess in the ventral right lower quadrant peritoneum. 6. Moderate pleural effusions and multi segment atelectasis. Electronically Signed   By: JMonte FantasiaM.D.   On: 07/25/2016 10:50   Ct Abdomen Pelvis W Contrast  Result Date: 07/20/2016 CLINICAL DATA:  Abnormal radiographs demonstrating free air, 3 days generalized abdominal and pelvic pain, denies recent surgery and trauma EXAM: CT ABDOMEN AND PELVIS WITH CONTRAST TECHNIQUE: Multidetector CT imaging of the abdomen and pelvis was performed using the standard protocol following bolus administration of intravenous contrast. Sagittal and coronal MPR  images reconstructed from axial data set. CONTRAST:  166mISOVUE-300 IOPAMIDOL (ISOVUE-300) INJECTION 61% PO, 10060mSOVUE-300 IOPAMIDOL (ISOVUE-300) INJECTION 61%  IV COMPARISON:  Abdominal radiographs 07/20/2016 FINDINGS: Lower chest: Lung bases clear Hepatobiliary: Distended gallbladder 5.1 cm transverse. Cystic lesion LEFT lobe liver 18 x 14 mm. No definite biliary dilatation. Pancreas: Normal appearance Spleen: Normal appearance Adrenals/Urinary Tract: Adrenal glands, kidneys, ureters, and bladder normal appearance Stomach/Bowel: Diffuse thickening of small bowel loops. Colon decompressed with some areas demonstrating diffuse colonic wall thickening as well. These changes may reflect diffuse peritonitis rather than a primary bowel process. Significant thickening of the antral wall of the stomach extending to pylorus could be due to gastritis or ulcer disease though an infiltrative process is not excluded. No definite contrast extravasation identified but findings are most concerning for gastric origin of perforation. Duodenum shows no definite wall thickening. Vascular/Lymphatic: Atherosclerotic calcifications aorta and iliac arteries. No aortic aneurysm or adenopathy. 9 mm perigastric node image 32. Reproductive: Unremarkable uterus and adnexa Other: Significant free intraperitoneal air and fluid compatible with perforated viscus. Scattered interloop air and fluid. Scattered areas of peritoneal enhancement compatible with peritonitis. No definite discrete loculated abscess collection. Tiny supraumbilical ventral hernia containing a small amount of free fluid and free air. Musculoskeletal: Unremarkable IMPRESSION: Extensive free intraperitoneal air and fluid compatible with perforated viscus. Site/source of perforation is not localized but greatest degree of wall thickening is identified at the gastric antrum, potentially could represent perforated ulcer disease. Mild diffuse bowel wall thickening of large and  small bowel loops throughout abdomen favoring reactive changes secondary to peritonitis. Findings called to Dr. Darl Householder on 07/20/2016 at 1440 hours. Electronically Signed   By: Lavonia Dana M.D.   On: 07/20/2016 14:43   Dg Chest Port 1 View  Result Date: 08/05/2016 CLINICAL DATA:  Shortness of breath. EXAM: PORTABLE CHEST 1 VIEW COMPARISON:  08/04/2016 FINDINGS: Right PICC terminates over the right atrium, unchanged. Enteric tube courses into the left upper abdomen with tip not imaged. Left pleural catheter has been removed. Right basilar chest tube versus subdiaphragmatic drain remains in place. A left upper quadrant abdominal drain is partially visualized. The cardiomediastinal silhouette is unchanged. There are persistent small pleural effusions, left larger than right and with the left-sided effusion extending towards the apex, not significantly changed. There is again the suggestion of a few small locules of gas in the basilar component of the left pleural effusion, unchanged. Airspace opacity in the left perihilar region has increased. Bibasilar airspace opacities have not significantly changed. Interstitial markings remain mildly increased diffusely. IMPRESSION: 1. Interval removal of left pleural catheter. Unchanged pleural effusions as above, partially loculated on the left. 2. Increased left perihilar opacity which may reflect worsening edema or pneumonia. Unchanged bibasilar airspace disease. Electronically Signed   By: Logan Bores M.D.   On: 08/05/2016 10:10   Dg Chest Port 1 View  Result Date: 07/30/2016 CLINICAL DATA:  Endotracheal tube placement.  Initial encounter. EXAM: PORTABLE CHEST 1 VIEW COMPARISON:  Chest radiograph performed earlier today at 2:53 a.m. FINDINGS: The patient's endotracheal tube is seen ending 2 cm above the carina. A right PICC is noted ending about the distal SVC. An enteric tube is seen extending below the diaphragm. A very large left-sided pleural effusion is again noted.  A small right pleural effusion is seen. Vascular congestion is noted, with increased interstitial markings, concerning for mild interstitial edema. No pneumothorax is seen. The cardiothymic silhouette is not well characterized. No acute osseous abnormalities are seen. IMPRESSION: 1. Endotracheal tube seen ending 2 cm above the carina. 2. Very large left-sided pleural effusion again noted. Small right pleural  effusion seen. Vascular congestion, with increased interstitial markings, concerning for mild interstitial edema. Electronically Signed   By: Garald Balding M.D.   On: 07/30/2016 04:36   Dg Chest Port 1 View  Result Date: 07/30/2016 CLINICAL DATA:  Acute onset of respiratory distress. Recent exploratory laparotomy. Initial encounter. EXAM: PORTABLE CHEST 1 VIEW COMPARISON:  Chest radiograph performed 07/29/2016 FINDINGS: Small to moderate right and large left pleural effusions are seen, markedly increased from the prior study. The left lung is largely collapsed due to the large pleural effusion. Underlying vascular congestion is seen. This is concerning for interstitial edema. No pneumothorax is seen. The cardiomediastinal silhouette is not well assessed due to opacification of the left hemithorax. A right PICC is noted ending about the cavoatrial junction. An enteric tube is noted extending below the diaphragm. A drain is noted at the left upper quadrant. No acute osseous abnormalities are seen. IMPRESSION: Small to moderate right and large left pleural effusions, markedly increased from the prior study. The left lung is largely collapsed due to the large pleural effusion. Underlying vascular congestion seen. This is concerning for interstitial edema. These results were called by telephone at the time of interpretation on 07/30/2016 at 3:23 am to [facility] in the [facility] CCU, who verbally acknowledged these results. Electronically Signed   By: Garald Balding M.D.   On: 07/30/2016 03:25   Dg  Chest Port 1 View  Result Date: 07/29/2016 CLINICAL DATA:  51 year old female with tachycardia and increased respiration. EXAM: PORTABLE CHEST 1 VIEW COMPARISON:  Chest chest radiograph dated 07/20/2016 and CT of the abdomen pelvis dated 07/27/2016 FINDINGS: Right-sided PICC with tip over the right cardiac border. An enteric tube is seen extending into the left hemiabdomen with side-port in the left upper abdomen and tip beyond the inferior margin of the image. A left pleural drainage catheter is noted. There is a small left pleural and probable trace right pleural effusions. Bibasilar hazy densities most consistent with atelectatic changes versus infiltrate. There is no pneumothorax. The cardiac silhouette is within normal limits. IMPRESSION: 1. Small bilateral pleural effusions, left greater than right with bibasilar atelectatic changes. Pneumonia is not excluded. 2. Support lines and tubes as described. Electronically Signed   By: Anner Crete M.D.   On: 07/29/2016 06:34   Dg Abd Acute W/chest  Addendum Date: 07/20/2016   ADDENDUM REPORT: 07/20/2016 13:50 ADDENDUM: Critical Value/emergent results were called by telephone at the time of interpretation on 07/20/2016 at 1348 hours to Dr. Shirlyn Goltz , who verbally acknowledged these results. CT Abdomen and Pelvis is planned and pending. Electronically Signed   By: Genevie Ann M.D.   On: 07/20/2016 13:50   Result Date: 07/20/2016 CLINICAL DATA:  51 year old female with severe abdominal pain and vomiting for 2 days. Denies abdominal surgery. Initial encounter. EXAM: DG ABDOMEN ACUTE W/ 1V CHEST COMPARISON:  None. FINDINGS: Seated AP view of the chest is positive for pneumoperitoneum under the right hemidiaphragm which is also confirmed on the upright abdomen view. No pneumothorax. No pneumomediastinum is evident. Normal cardiac size and mediastinal contours. Visualized tracheal air column is within normal limits. The lungs appear clear. No pleural effusion.  Unusual bowel-gas pattern including a central epigastric air-fluid level which seems unrelated to the stomach (arrow). On the upright abdomen view there is pneumoperitoneum under both hemidiaphragms. No other dilated loops. Relatively normal appearing distal small bowel and proximal colon gas. No acute osseous abnormality identified. IMPRESSION: 1. Positive for fairly large volume of free  air in the abdomen indicating ruptured abdominal viscus. If the patient is stable enough CT Abdomen and Pelvis with oral and IV contrast is recommended. 2. Unusual epigastric bowel-gas pattern suspicious for dilated and abnormal upper abdominal bowel loop. 3.  No superimposed acute cardiopulmonary abnormality. Electronically Signed: By: Genevie Ann M.D. On: 07/20/2016 13:45   Dg Duanne Limerick W/water Sol Cm  Result Date: 07/23/2016 CLINICAL DATA:  Perforated ulcers. EXAM: WATER SOLUBLE UPPER GI SERIES TECHNIQUE: Single-column upper GI series was performed using water soluble contrast. CONTRAST:  116m ISOVUE-300 IOPAMIDOL (ISOVUE-300) INJECTION 61% COMPARISON:  KUB 07/21/2016.  CT 07/20/2016. FLUOROSCOPY TIME:  Fluoroscopy Time:  1 minutes 12 seconds. Radiation Exposure Index (if provided by the fluoroscopic device): 24.2 mGy Number of Acquired Spot Images: 10 FINDINGS: NG tube noted good position. Standard were soluble upper GI performed through the NG tube. Dilute Isovue-300 was utilized. Exam was limited as the patient would not stay on the table but for a limited time. Thickened antral and duodenal folds are noted. Contrast extravasation is noted arising from the region of the antrum and duodenum. Multiple sites of perforation cannot be excluded. Prominent gastroesophageal reflux. IMPRESSION: 1. Limited exam. Contrast extravasation is noted arising from the region of the antrum and duodenum. Multiple sites of perforation cannot be excluded . 2. NG tube noted in good anatomic position. Prominent gastroesophageal reflux. Electronically  Signed   By: TMarcello Moores Register   On: 07/23/2016 08:50   Ct Image Guided Drainage Percut Cath  Peritoneal Retroperit  Result Date: 07/25/2016 INDICATION: Right upper quadrant abscess EXAM: CT CORE BIOPSY RENAL MEDICATIONS: The patient is currently admitted to the hospital and receiving intravenous antibiotics. The antibiotics were administered within an appropriate time frame prior to the initiation of the procedure. ANESTHESIA/SEDATION: Fentanyl 75 mcg IV; Versed 2.5 mg IV Moderate Sedation Time:  30 The patient was continuously monitored during the procedure by the interventional radiology nurse under my direct supervision. COMPLICATIONS: None immediate. PROCEDURE: Informed written consent was obtained from the patient after a thorough discussion of the procedural risks, benefits and alternatives. All questions were addressed. Maximal Sterile Barrier Technique was utilized including caps, mask, sterile gowns, sterile gloves, sterile drape, hand hygiene and skin antiseptic. A timeout was performed prior to the initiation of the procedure. Under CT guidance, an 18 gauge needle was inserted into the right upper quadrant abscess and removed over an Amplatz wire. Twelve FPakistandilator followed by a 12 FPakistandrain were inserted. It was looped and string fixed. Serous fluid was aspirated. FINDINGS: Images document 167French drain placement into a right upper quadrant abscess. IMPRESSION: Successful CT-guided right upper quadrant 12 French abscess drain. Electronically Signed   By: AMarybelle KillingsM.D.   On: 07/25/2016 15:45    Assessment:   THILDEGARD HLAVACis a 51y.o. female with perforated peptic ulcer, with complicated course since admission with placement of an IR drain and then repeat surgery 2/26. All cultures have been negative. She has been on zosyn since admission and started fluconazole 2/26. She has no fevers but her wbc remains elevated. She has neg MRSA PCR. Multiple drains in place. Has some drainage  from penrose site at incision. PICC in place and on TPN. Has pleural effusions as well. Remains quite ill with WBC 30 but has moved out of ICU today.   I would not make any abx changes today but monitor for worsening and consideration of starting vanco and changing fluconazole to eraxis if worsens.  Recommendations Cont  zosyn and fluconazole I have cultured the drainage from around the incision. Will follow Thank you very much for allowing me to participate in the care of this patient. Please call with questions.   Cheral Marker. Ola Spurr, MD

## 2016-08-06 NOTE — Progress Notes (Signed)
PHARMACY - ADULT TOTAL PARENTERAL NUTRITION CONSULT NOTE   Pharmacy Consult for TPN/ electrolytes Indication: Perforated ulcer  Patient Measurements: Height: 5\' 10"  (177.8 cm) Weight: 157 lb 13.6 oz (71.6 kg) IBW/kg (Calculated) : 68.5 TPN AdjBW (KG): 58.8 Body mass index is 22.65 kg/m.  CMP Latest Ref Rng & Units 08/06/2016 08/06/2016 08/05/2016  Glucose 65 - 99 mg/dL 122(H) 132(H) -  BUN 6 - 20 mg/dL 10 10 -  Creatinine 0.44 - 1.00 mg/dL 0.31(L) 0.32(L) -  Sodium 135 - 145 mmol/L 135 134(L) -  Potassium 3.5 - 5.1 mmol/L 3.1(L) 3.2(L) 3.1(L)  Chloride 101 - 111 mmol/L 103 102 -  CO2 22 - 32 mmol/L 25 28 -  Calcium 8.9 - 10.3 mg/dL 7.1(L) 7.2(L) -  Total Protein 6.5 - 8.1 g/dL - - -  Total Bilirubin 0.3 - 1.2 mg/dL - - -  Alkaline Phos 38 - 126 U/L - - -  AST 15 - 41 U/L - - -  ALT 14 - 54 U/L - - -      Magnesium  Date Value Ref Range Status  08/06/2016 1.9 1.7 - 2.4 mg/dL Final   Phosphorus  Date Value Ref Range Status  08/06/2016 3.5 2.5 - 4.6 mg/dL Final    Assessment:Pharmacy consulted to assist in the management of electrolytes and glucose in this 51 year old female being starting on parenteral nutrition. Patient extubated on 3/1.   Plan: Continue Clinimix E 5/20to 83 ml/hrwith Trace elements and MVI along with lipids 20% @ 20 ml/hr for 12 hours daily per dietary's recommendations.   Potassium and magnesium replaced with KCl 30 meq iv x 2 and magnesium sulfate 2 g iv once. Lasix order complete. Will f/u AM labs.    Has used 1 units of SSI in 24 hours, continue Q4H accuchecks.  Ulice Dash, PharmD Clinical Pharmacist  08/06/2016

## 2016-08-06 NOTE — Progress Notes (Signed)
PT Cancellation Note  Patient Details Name: UGANDA MATCZAK MRN: [id number] DOB: 14-Nov-1965   Cancelled Treatment:    Reason Eval/Treat Not Completed: Other (comment).  Nursing preparing to transfer pt to floor (pt not available for PT treatment).  Will re-attempt PT treatment session at a later date/time.  Leitha Bleak, PT 08/06/16, 3:12 PM [phone]

## 2016-08-07 ENCOUNTER — Inpatient Hospital Stay: Payer: BLUE CROSS/BLUE SHIELD

## 2016-08-07 LAB — CBC
HEMATOCRIT: 22.8 % — AB (ref 35.0–47.0)
HEMOGLOBIN: 7.2 g/dL — AB (ref 12.0–16.0)
MCH: 23.6 pg — ABNORMAL LOW (ref 26.0–34.0)
MCHC: 31.8 g/dL — ABNORMAL LOW (ref 32.0–36.0)
MCV: 74.1 fL — ABNORMAL LOW (ref 80.0–100.0)
Platelets: 639 10*3/uL — ABNORMAL HIGH (ref 150–440)
RBC: 3.08 MIL/uL — AB (ref 3.80–5.20)
RDW: 25.4 % — ABNORMAL HIGH (ref 11.5–14.5)
WBC: 35.6 10*3/uL — ABNORMAL HIGH (ref 3.6–11.0)

## 2016-08-07 LAB — BASIC METABOLIC PANEL
ANION GAP: 7 (ref 5–15)
BUN: 11 mg/dL (ref 6–20)
CO2: 23 mmol/L (ref 22–32)
Calcium: 7.2 mg/dL — ABNORMAL LOW (ref 8.9–10.3)
Chloride: 105 mmol/L (ref 101–111)
Creatinine, Ser: 0.34 mg/dL — ABNORMAL LOW (ref 0.44–1.00)
GFR calc Af Amer: >60 mL/min (ref >60)
Glucose, Bld: 116 mg/dL — ABNORMAL HIGH (ref 65–99)
POTASSIUM: 3 mmol/L — AB (ref 3.5–5.1)
SODIUM: 135 mmol/L (ref 135–145)

## 2016-08-07 LAB — GLUCOSE, CAPILLARY
GLUCOSE-CAPILLARY: 114 mg/dL — AB (ref 65–99)
Glucose-Capillary: 100 mg/dL — ABNORMAL HIGH (ref 65–99)
Glucose-Capillary: 106 mg/dL — ABNORMAL HIGH (ref 65–99)
Glucose-Capillary: 113 mg/dL — ABNORMAL HIGH (ref 65–99)
Glucose-Capillary: 114 mg/dL — ABNORMAL HIGH (ref 65–99)
Glucose-Capillary: 93 mg/dL (ref 65–99)

## 2016-08-07 LAB — MAGNESIUM: Magnesium: 1.7 mg/dL (ref 1.7–2.4)

## 2016-08-07 MED ORDER — POTASSIUM CHLORIDE CRYS ER 20 MEQ PO TBCR: 30.0000 meq | EXTENDED_RELEASE_TABLET | ORAL | Status: DC

## 2016-08-07 MED ORDER — IOPAMIDOL (ISOVUE-300) INJECTION 61%
15.0000 mL | INTRAVENOUS | Status: AC
  Administered 2016-08-07 (×2): 15 mL via ORAL

## 2016-08-07 MED ORDER — IOPAMIDOL (ISOVUE-300) INJECTION 61%
100.0000 mL | Freq: Once | INTRAVENOUS | Status: AC | PRN
  Administered 2016-08-07: 100 mL via INTRAVENOUS

## 2016-08-07 MED ORDER — FAT EMULSION 20 % IV EMUL
250.0000 mL | INTRAVENOUS | Status: AC
Start: 2016-08-07 — End: 2016-08-08
  Administered 2016-08-07: 250 mL via INTRAVENOUS
  Filled 2016-08-07: qty 250

## 2016-08-07 MED ORDER — TRACE MINERALS CR-CU-MN-SE-ZN 10-1000-500-60 MCG/ML IV SOLN
INTRAVENOUS | Status: AC
  Administered 2016-08-07: 18:00:00 via INTRAVENOUS
  Filled 2016-08-07: qty 1992

## 2016-08-07 MED ORDER — SODIUM CHLORIDE 0.9 % IV SOLN
Freq: Once | INTRAVENOUS | Status: AC
Start: 2016-08-07 — End: 2016-08-07
  Administered 2016-08-07: 18:00:00 via INTRAVENOUS

## 2016-08-07 MED ORDER — SODIUM CHLORIDE 0.9 % IV SOLN
30.0000 meq | Freq: Two times a day (BID) | INTRAVENOUS | Status: AC
  Administered 2016-08-07 (×2): 30 meq via INTRAVENOUS
  Filled 2016-08-07 (×2): qty 15

## 2016-08-07 MED ORDER — MAGNESIUM SULFATE 2 GM/50ML IV SOLN
2.0000 g | Freq: Once | INTRAVENOUS | Status: AC
  Administered 2016-08-07: 2 g via INTRAVENOUS
  Filled 2016-08-07: qty 50

## 2016-08-07 NOTE — Progress Notes (Signed)
CT scan reviewed personally. Discussed CT scan and reviewed personally with Dr. Marta Lamas Official reading is not yet back but films of been reviewed personally. There is a small fluid collection near the spleen but all the other fluid collections have been drained adequately. I see no obvious gastric leak. But I will await the final report concerning that.  Most concerning is the bilateral pleural effusions the right appears fairly simple but large. The left is loculated and has air bubbles within it and is quite thickened and extends anteriorly near the anterior cardiac border.  Dr. Faith Rogue and I discussed with the patient a plan for placing a right chest tube and performing a left thoracoscopy with possible thoracotomy for empyema or loculated pleural effusion. Cultures will be obtained at that time. The rationale for this was discussed and risks were detailed by Dr. Faith Rogue.  I also suggested that at the same time under general anesthesia since her Penrose in the midline abdominal wound was removed and is still draining purulence that inspection under anesthesia, possible debridement, and possible wound VAC placement would be performed at the same time.  I also reviewed for the patient in detail the rationale for offering a packed red blood cell transfusion preoperatively as her hemoglobin has dropped to 7.2 and she is to require additional general anesthetics with the potential for continued blood loss. She also has probable some compounded of anemia of chronic disease at this point. All of this was discussed with the patient. I also reviewed the risks of viral disease contraction via the packed red blood cell transfusion including HIV and hepatitis viruses she understood and agreed with this plan.

## 2016-08-07 NOTE — Progress Notes (Signed)
Pt pulled out NG tube--Dr. Burt Knack notified.  Okay to leave out NG tube at this time. No new orders given.  Will continue to monitor.

## 2016-08-07 NOTE — Progress Notes (Signed)
PHARMACY - ADULT TOTAL PARENTERAL NUTRITION CONSULT NOTE   Pharmacy Consult for TPN/ electrolytes Indication: Perforated ulcer  Patient Measurements: Height: 5\' 10"  (177.8 cm) Weight: 157 lb 13.6 oz (71.6 kg) IBW/kg (Calculated) : 68.5 TPN AdjBW (KG): 58.8 Body mass index is 22.65 kg/m.  CMP Latest Ref Rng & Units 08/07/2016 08/06/2016 08/06/2016  Glucose 65 - 99 mg/dL 116(H) 122(H) 132(H)  BUN 6 - 20 mg/dL 11 10 10   Creatinine 0.44 - 1.00 mg/dL 0.34(L) 0.31(L) 0.32(L)  Sodium 135 - 145 mmol/L 135 135 134(L)  Potassium 3.5 - 5.1 mmol/L 3.0(L) 3.1(L) 3.2(L)  Chloride 101 - 111 mmol/L 105 103 102  CO2 22 - 32 mmol/L 23 25 28   Calcium 8.9 - 10.3 mg/dL 7.2(L) 7.1(L) 7.2(L)  Total Protein 6.5 - 8.1 g/dL - - -  Total Bilirubin 0.3 - 1.2 mg/dL - - -  Alkaline Phos 38 - 126 U/L - - -  AST 15 - 41 U/L - - -  ALT 14 - 54 U/L - - -      Magnesium  Date Value Ref Range Status  08/07/2016 1.7 1.7 - 2.4 mg/dL Final   Phosphorus  Date Value Ref Range Status  08/06/2016 3.5 2.5 - 4.6 mg/dL Final    Assessment:Pharmacy consulted to assist in the management of electrolytes and glucose in this 51 year old female being starting on parenteral nutrition. Patient extubated on 3/1. Transferred out of ICU on 3/5.     Plan: Continue Clinimix E 5/20to 83 ml/hrwith Trace elements and MVI along with lipids 20% @ 20 ml/hr for 12 hours daily per dietary's recommendations.   Potassium and magnesium replaced with KCl 30 meq IV x 2 and magnesium sulfate 2 g iv once.  Will f/u AM labs.    Has not received any SSI in 24 hours, continue Q4H accuchecks.  Pernell Dupre, PharmD, BCPS Clinical Pharmacist 08/07/2016 7:35 AM

## 2016-08-07 NOTE — Progress Notes (Signed)
[facility] INFECTIOUS DISEASE PROGRESS NOTE Date of Admission:  07/20/2016     ID: Lindsey Wolf is a 51 y.o. female with  intrabdominal infection post perforated peptic ulcer Active Problems:   Perforated ulcer ([place])   Abdominal abscess ([place])   Abnormal respirations   Pleural effusion   Respiratory distress   Subjective: Was confused and pulled off abd derssing and penrose drain. Had Ct  Remains afebrile but wb cup to 36  ROS  Eleven systems are reviewed and negative except per hpi  Medications:  Antibiotics Given (last 72 hours)    Date/Time Action Medication Dose Rate   08/04/16 2253 Given   piperacillin-tazobactam (ZOSYN) IVPB 3.375 g 3.375 g 12.5 mL/hr   08/05/16 0649 Given   piperacillin-tazobactam (ZOSYN) IVPB 3.375 g 3.375 g 12.5 mL/hr   08/05/16 0823 Given   vancomycin (VANCOCIN) IVPB 1000 mg/200 mL premix 1,000 mg 200 mL/hr   08/05/16 1300 Given   piperacillin-tazobactam (ZOSYN) IVPB 3.375 g 3.375 g 12.5 mL/hr   08/05/16 1515 Given   vancomycin (VANCOCIN) IVPB 1000 mg/200 mL premix 1,000 mg 200 mL/hr   08/05/16 2122 Given   piperacillin-tazobactam (ZOSYN) IVPB 3.375 g 3.375 g 12.5 mL/hr   08/06/16 0209 Given   vancomycin (VANCOCIN) IVPB 1000 mg/200 mL premix 1,000 mg 200 mL/hr   08/06/16 0520 Given   piperacillin-tazobactam (ZOSYN) IVPB 3.375 g 3.375 g 12.5 mL/hr   08/06/16 1444 Given   piperacillin-tazobactam (ZOSYN) IVPB 3.375 g 3.375 g 12.5 mL/hr   08/06/16 2140 Given   piperacillin-tazobactam (ZOSYN) IVPB 3.375 g 3.375 g 12.5 mL/hr   08/07/16 0511 Given   piperacillin-tazobactam (ZOSYN) IVPB 3.375 g 3.375 g 12.5 mL/hr   08/07/16 1515 Given   piperacillin-tazobactam (ZOSYN) IVPB 3.375 g 3.375 g 12.5 mL/hr     . sodium chloride   Intravenous Once  . enoxaparin (LOVENOX) injection  40 mg Subcutaneous Q24H  . fluconazole (DIFLUCAN) IV  400 mg Intravenous Q24H  . insulin aspart  0-9 Units Subcutaneous Q4H  . pantoprazole (PROTONIX) IV  40 mg  Intravenous Q12H  . piperacillin-tazobactam (ZOSYN)  IV  3.375 g Intravenous Q8H  . sodium chloride flush  10-40 mL Intracatheter Q12H    Objective: Vital signs in last 24 hours: Temp:  [97.2 F (36.2 C)-99.3 F (37.4 C)] 97.2 F (36.2 C) (03/06 1214) Pulse Rate:  [97-113] 97 (03/06 1214) Resp:  [16-28] 16 (03/06 1214) BP: (126-136)/(73-80) 126/73 (03/06 1214) SpO2:  [93 %-95 %] 95 % (03/06 1214) Weight:  [63.2 kg (139 lb 6.4 oz)] 63.2 kg (139 lb 6.4 oz) (03/06 0500) Constitutional:  Ill appearing HENT: [place]/AT, PERRLA, no scleral icterus Mouth/Throat: Oropharynx is clear and dry. No oropharyngeal exudate.  Cardiovascular: Normal rate, regular rhythm and normal heart sounds.  Pulmonary/Chest: Effort normal and breath sounds normal. No respiratory distress.  has no wheezes.  Neck = supple, no nuchal rigidity Abdominal: Soft. abd mildly distended, inciision along midline with mild erythema, penrose drain at lower portion with thin drainage 3 JP drains in place, 2 on R and 1 on L with ss drainage. abd mildly tender Lymphadenopathy: no cervical adenopathy. No axillary adenopathy Neurological: alert and oriented to person, place, and time.  Skin: Skin is warm and dry. No rash noted. No erythema.  PICC RUE wnl Foley cath in place Psychiatric: a normal mood and affect.  behavior is normal.   Lab Results  Recent Labs  08/06/16 0510 08/06/16 1211 08/07/16 0228  WBC 30.9*  --  35.6*  HGB 7.4*  --  7.2*  HCT 23.1*  --  22.8*  NA 134* 135 135  K 3.2* 3.1* 3.0*  CL 102 103 105  CO2 28 25 23   BUN 10 10 11   CREATININE 0.32* 0.31* 0.34*    Microbiology: Results for orders placed or performed during the hospital encounter of 07/20/16  [place] rt PCR ([place] only)     Status: None   Collection Time: 07/20/16  1:15 PM  Result Value Ref Range Status   Specimen source GC/Chlam ENDOCERVICAL  Final   Chlamydia Tr NOT DETECTED NOT DETECTED Final   N gonorrhoeae NOT DETECTED NOT  DETECTED Final    Comment: (NOTE) 100  This methodology has not been evaluated in pregnant women or in 200  patients with a history of hysterectomy. 300 400  This methodology will not be performed on patients less than 25  years of age.   Wet prep, genital     Status: Abnormal   Collection Time: 07/20/16  1:15 PM  Result Value Ref Range Status   Yeast Wet Prep HPF POC NONE SEEN NONE SEEN Final   Trich, Wet Prep NONE SEEN NONE SEEN Final   Clue Cells Wet Prep HPF POC NONE SEEN NONE SEEN Final   WBC, Wet Prep HPF POC RARE (A) NONE SEEN Final   Sperm NONE SEEN  Final  Blood culture (routine x 2)     Status: None   Collection Time: 07/20/16  3:07 PM  Result Value Ref Range Status   Specimen Description BLOOD left forearm  Final   Special Requests   Final    BOTTLES DRAWN AEROBIC AND ANAEROBIC AER12ML ANA12ML   Culture NO GROWTH 5 DAYS  Final   Report Status 07/25/2016 FINAL  Final  MRSA PCR Screening     Status: None   Collection Time: 07/20/16  9:29 PM  Result Value Ref Range Status   MRSA by PCR NEGATIVE NEGATIVE Final    Comment:        The GeneXpert MRSA Assay (FDA approved for NASAL specimens only), is one component of a comprehensive MRSA colonization surveillance program. It is not intended to diagnose MRSA infection nor to guide or monitor treatment for MRSA infections.   Aerobic/Anaerobic Culture (surgical/deep wound)     Status: None   Collection Time: 07/25/16  3:00 PM  Result Value Ref Range Status   Specimen Description ABSCESS RIGHT ABDOMEN  Final   Special Requests NONE  Final   Gram Stain   Final    RARE WBC PRESENT, PREDOMINANTLY PMN NO ORGANISMS SEEN    Culture   Final    No growth aerobically or anaerobically. Performed at [facility], [place] [address], [place], [place] [address]    Report Status 07/31/2016 FINAL  Final  Body fluid culture     Status: None   Collection Time: 07/30/16 11:40 AM  Result Value Ref Range Status   Specimen  Description PLEURAL  Final   Special Requests NONE  Final   Gram Stain   Final    MODERATE WBC PRESENT, PREDOMINANTLY PMN NO ORGANISMS SEEN    Culture   Final    No growth aerobically or anaerobically. Performed at [facility], [place] [address] [place], [place] [address]    Report Status 08/03/2016 FINAL  Final  Acid Fast Smear (AFB)     Status: None   Collection Time: 07/30/16 11:40 AM  Result Value Ref Range Status  AFB Specimen Processing Concentration  Final   Acid Fast Smear Negative  Final    Comment: (NOTE) Performed At: [facility] [place], [place] [id number] Lindon Romp MD [id number]    Source (AFB) PLEURAL  Final  Aerobic Culture (superficial specimen)     Status: None (Preliminary result)   Collection Time: 08/06/16  3:34 PM  Result Value Ref Range Status   Specimen Description ABDOMEN  Final   Special Requests Normal  Final   Gram Stain   Final    RARE WBC PRESENT,BOTH PMN AND MONONUCLEAR NO ORGANISMS SEEN    Culture   Final    RARE GRAM NEGATIVE RODS CULTURE REINCUBATED FOR BETTER GROWTH Performed at [facility], [place] [address], [place], [place] [address]    Report Status PENDING  Incomplete    Studies/Results: Dg Abd 1 View  Result Date: 08/07/2016 CLINICAL DATA:  Nasogastric tube placement EXAM: ABDOMEN - 1 VIEW COMPARISON:  CT 08/01/2016 FINDINGS: Multiple abdominal drains are again evident. Nasogastric tube reaches the stomach but the proximal port may be at the EG junction. Consolidation and effusion in both bases. IMPRESSION: 1. Nasogastric tube reaches the stomach but advancement by 5-10 cm is recommended for optimal placement, as the proximal port is probably at the EG junction. 2. Consolidation and effusion in both bases. 3. These results will be called to the ordering clinician or representative by the Radiologist Assistant, and communication documented in the PACS or zVision Dashboard. Electronically  Signed   By: Andreas Newport M.D.   On: 08/07/2016 01:45   Dg Chest Port 1 View  Result Date: 08/07/2016 CLINICAL DATA:  Respiratory failure EXAM: PORTABLE CHEST 1 VIEW COMPARISON:  08/05/2016 FINDINGS: Nasogastric tube extends below the diaphragm. Right upper extremity PICC line extends to the cavoatrial junction. Central and basilar lung opacities persist, slightly improved with partial clearance since 08/05/2016. Pleural effusions persist bilaterally without significant interval change. IMPRESSION: 1. Nasogastric tube extends below the diaphragm. As described in the accompanying abdominal imaging, it should be advanced for optimal placement. 2. Persistent central and basilar airspace opacities with slight improvement. Persistent pleural effusions. Electronically Signed   By: Andreas Newport M.D.   On: 08/07/2016 01:47    Assessment/Plan: Lindsey Wolf is a 51 y.o. female with perforated peptic ulcer, with complicated course since admission with placement of an IR drain and then repeat surgery 2/26. All cultures have been negative. She has been on zosyn since admission and started fluconazole 2/26. She has no fevers but her wbc remains elevated. She has neg MRSA PCR. Multiple drains in place. Has some drainage from penrose site at incision. PICC in place and on TPN. Has pleural effusions as well.  Wound cx pending from incision site. WBC up to 36, CT shows empyema   Recommendations Agree with Dr Genevive Bi plan for surgical drainage of empyema Cont zosyn and fluconazole  Will follow Thank you very much for the consult. Will follow with you.  Vedanth Sirico P   08/07/2016, 4:05 PM

## 2016-08-07 NOTE — Plan of Care (Signed)
Problem: Safety: Goal: Ability to remain free from injury will improve Outcome: Not Progressing Tele sitter ordered   Problem: Nutrition: Goal: Adequate nutrition will be maintained Outcome: Progressing TPN

## 2016-08-07 NOTE — Progress Notes (Signed)
Nutrition Follow-up  DOCUMENTATION CODES:   Not applicable  INTERVENTION:  Continue Clinimix E 5/20 @ 83 ml/hr + 20% ILE @ 20 ml/hr over 12 hours. Provides 2233 kcal, 99.6 grams of protein, 2232 ml fluid daily.  NUTRITION DIAGNOSIS:   Inadequate oral intake related to inability to eat as evidenced by NPO status.  Ongoing - addressing with TPN.   GOAL:   Patient will meet greater than or equal to 90% of their needs  Met with TPN.  MONITOR:   I & O's, Diet advancement, Labs, Weight trends, Supplement acceptance  REASON FOR ASSESSMENT:   Malnutrition Screening Tool    ASSESSMENT:   Lindsey Wolf is a 51 y.o. female with a 3 days hx of abdominal pain in the epigastric area and she thinks it was a gas type. Pain is moderate to severe in intensity and has worsening today.  Significant Events:  -Per HPI patient takes daily BC powder for headache. Found to have perforated peptic ulcers. -Patient s/p exploratory laparotomy with repair of gastric ulcers x 2 and repair of umbilical hernia on 9/70. -TPN initiated 2/21.  -Right abdominal 12 Fr abscess drain placed with CT on 2/21 -Patient transferred to ICU for hypoxia and SOB on 2/25 and was subsequently intubated on 2/26 due to respiratory failure. -Left thoracostomy tube placed 2/26 for pleural effusion. Later removed on 3/3. -Patient s/p exploratory laparotomy with lysis of adhesions and drainage of peritoneal abscess on 2/26. -Patient extubated 3/1. -Transferred back to [place] on 3/5.   Spoke with patient at bedside. She reports she is having some nausea and abdominal pain. Also discussed with RN who reports patient had one large bowel movement earlier today after drinking contrast.   Access: Triple Lumen PICC placed 07/25/2016  TPN: Tolerating goal regimen of Clinimix E 5/20 @ 83 ml/hr + 20% ILE @ 20 ml/hr over 12 hours (GIR 4.4, 0.76 grams lipid/kg, 0.06 grams lipid/kg/hr)  Medications reviewed and include: Novolog sliding  scale Q4hrs (none received past 24 hrs), pantoprazole, Zosyn, potassium chloride 30 mEq BID, Ativan 1 mg Q4hrs PRN.  Labs reviewed: CBG 72-120 past 24 hrs, Potassium 3, Creatinine 0.34.   UOP: 5.825 L (3.8 ml/kg/hr) from 0700 3/5 to 0700 3/6  NG tube replaced this morning after patient removed yesterday. Per Abd X-ray this AM port likely in GE junction and recommend advancing 5-10 cm. Now s/p advancement by 10 cm (now 65 cm at nare). 14 french to LIS with 10 ml output past 24 hrs.  Weight trend: 63.2 kg on 3/6 (-2.2 kg from 3/5 which is likely due to new bed scale with transfer)  Diet Order:  Diet NPO time specified .TPN (CLINIMIX-E) Adult TPN (CLINIMIX-E) Adult  Skin:  Reviewed, no issues  Last BM:  07/28/16  Height:   Ht Readings from Last 1 Encounters:  07/20/16 _0  (1.778 m)    Weight:   Wt Readings from Last 1 Encounters:  08/07/16 139 lb 6.4 oz (63.2 kg)    Ideal Body Weight:  68.18 kg  BMI:  Body mass index is 20 kg/m.  Estimated Nutritional Needs:   Kcal:  2050-2400 kcals  Protein:  102-136 g  Fluid:  >/= 2 L  EDUCATION NEEDS:   No education needs identified at this time  Willey Blade, MS, RD, LDN Pager: [phone] After Hours Pager: [phone]

## 2016-08-07 NOTE — Progress Notes (Signed)
  Called the nursing staff due to patient with confusion. Patient pulled out her NG and then apparently pulled off her dressing and pulled out her Penrose drain.  At the time of my dilation the patient was resting comfortably in her bed and appeared oriented.  NG tube replaced, dressing resecured, sitter ordered for confusion.  Clayburn Pert, MD [facility]  Day [facility] [phone] Night ASCOM [phone]

## 2016-08-07 NOTE — Progress Notes (Signed)
PT Cancellation Note  Patient Details Name: Lindsey Wolf MRN: [id number] DOB: [birth year]   Cancelled Treatment:    Reason Eval/Treat Not Completed: Medical issues which prohibited therapy; Pt's HgB 7.2, Ka 3.0 with pt somewhat confused/disoriented having recently pulled out her NG tube.  Pt scheduled for multiple surgeries 08/08/16 and will hold PT services this date.  Will reassess pt after scheduled surgeries as medically appropriate.     Linus Salmons PT, DPT 08/07/16, 4:11 PM

## 2016-08-07 NOTE — Progress Notes (Signed)
Dr Adonis Huguenin, notified of pt pulling ng tube out. Orders to place new tube. LIS. Tube placed in R nare. CXR ordered for verification. Tele sitter ordered for pt confusion. Md also made aware of penrose drain coming out.

## 2016-08-07 NOTE — Progress Notes (Signed)
[place] [facility] [place] NAME: Lindsey Wolf    MR#:  [id number]  DATE OF BIRTH:  [birth year]  SUBJECTIVE:  Patient transferred from ICU with prolonged hospital stay due to complications of perforated ulcer  REVIEW OF SYSTEMS:    ROS She is with some confusion this morning  Tolerating Diet: TPN      DRUG ALLERGIES:  No Known Allergies  VITALS:  Blood pressure 136/77, pulse (!) 108, temperature 98.2 F (36.8 C), temperature source Oral, resp. rate 20, height 5\' 10"  (1.778 m), weight 63.2 kg (139 lb 6.4 oz), SpO2 93 %.  PHYSICAL EXAMINATION:   Physical Exam  Constitutional: She is well-developed, well-nourished, and in no distress. No distress.  HENT:  Head: Normocephalic.  Eyes: No scleral icterus.  Neck: Normal range of motion. Neck supple. No JVD present. No tracheal deviation present.  Cardiovascular: Normal rate, regular rhythm and normal heart sounds.  Exam reveals no gallop and no friction rub.   No murmur heard. Pulmonary/Chest: Effort normal and breath sounds normal. No respiratory distress. She has no wheezes. She has no rales. She exhibits no tenderness.  Abdominal: She exhibits no distension and no mass. There is no tenderness. There is no rebound and no guarding.  jp drain Drainage from wound   Musculoskeletal: Normal range of motion. She exhibits no edema.  Neurological: She is alert.  confused  Skin: Skin is warm. No rash noted. No erythema.      LABORATORY PANEL:   CBC  Recent Labs Lab 08/07/16 0228  WBC 35.6*  HGB 7.2*  HCT 22.8*  PLT 639*   ------------------------------------------------------------------------------------------------------------------  Chemistries   Recent Labs Lab 08/03/16 0348  08/07/16 0228  NA 134*  < > 135  K 3.1*  < > 3.0*  CL 97*  < > 105  CO2 31  < > 23  GLUCOSE 127*  < > 116*  BUN 10  < > 11  CREATININE 0.31*  < > 0.34*  CALCIUM 7.2*  < > 7.2*  MG 1.7  < >  1.7  AST 36  --   --   ALT 19  --   --   ALKPHOS 155*  --   --   BILITOT <0.1*  --   --   < > = values in this interval not displayed. ------------------------------------------------------------------------------------------------------------------  Cardiac Enzymes No results for input(s): TROPONINI in the last 168 hours. ------------------------------------------------------------------------------------------------------------------  RADIOLOGY:  Dg Abd 1 View  Result Date: 08/07/2016 CLINICAL DATA:  Nasogastric tube placement EXAM: ABDOMEN - 1 VIEW COMPARISON:  CT 08/01/2016 FINDINGS: Multiple abdominal drains are again evident. Nasogastric tube reaches the stomach but the proximal port may be at the EG junction. Consolidation and effusion in both bases. IMPRESSION: 1. Nasogastric tube reaches the stomach but advancement by 5-10 cm is recommended for optimal placement, as the proximal port is probably at the EG junction. 2. Consolidation and effusion in both bases. 3. These results will be called to the ordering clinician or representative by the Radiologist Assistant, and communication documented in the PACS or zVision Dashboard. Electronically Signed   By: Andreas Newport M.D.   On: 08/07/2016 01:45   Dg Chest Port 1 View  Result Date: 08/07/2016 CLINICAL DATA:  Respiratory failure EXAM: PORTABLE CHEST 1 VIEW COMPARISON:  08/05/2016 FINDINGS: Nasogastric tube extends below the diaphragm. Right upper extremity PICC line extends to the cavoatrial junction. Central and basilar lung opacities persist, slightly improved with partial clearance since  08/05/2016. Pleural effusions persist bilaterally without significant interval change. IMPRESSION: 1. Nasogastric tube extends below the diaphragm. As described in the accompanying abdominal imaging, it should be advanced for optimal placement. 2. Persistent central and basilar airspace opacities with slight improvement. Persistent pleural effusions.  Electronically Signed   By: Andreas Newport M.D.   On: 08/07/2016 01:47     ASSESSMENT AND PLAN:    51 year old female admitted with the perforation peptic ulcer on fingers 16th and underwent emergent surgery with findings of purulent peritonitis who has had a complicated course.  1. Perforated peptic ulcer with complicated course since admission: Plan per surgery for repeat CT scan given increasing white blood cell count and confusion Continue. For TPN Continue Zosyn and fluconazole as requested by ID Follow up on culture from drainage around the incision.  2. Hypokalemia: Replete and repeat imaging  3. Acute blood loss anemia: Consider blood transfusion    4. Severe sepsis on admission: Resolved  5. PAF: Resolved      Management plans discussed with Dr Burt Knack CODE STATUS: FULL  TOTAL TIME TAKING CARE OF THIS PATIENT: 26 minutes.     POSSIBLE D/C ??, DEPENDING ON CLINICAL CONDITION.   Chasiti Waddington M.D on 08/07/2016 at 9:12 AM  Between 7am to 6pm - Pager - [phone] After 6pm go to [email or web address] - password [email or web address] [facility]  Office  [phone]  CC: Primary care physician; No PCP Per Patient  Note: This dictation was prepared with Dragon dictation along with smaller phrase technology. Any transcriptional errors that result from this process are unintentional.

## 2016-08-07 NOTE — Progress Notes (Signed)
I was asked by Dr. Cooper to review this patient's CT scan. She is a 50-year-old woman with a complicated past medical history including several abdominal procedures for gastric ulcers. She also had bilateral pleural effusions and had a left-sided chest tube placed in the past. This drained purulent material for several days but ultimately the tube was removed. She has had a steadily increasing white blood cell count and a repeat CT scan the chest and abdomen revealed a multiloculated left-sided pleural effusion with pleural enhancement and air within the pleural effusion. This was thought to be consistent with a empyema. I was asked to see her for consideration of drainage.  On exam her prior chest tube site is well-healed. She has a large midline abdominal incision with some areas of drainage. Her CT scan was independently reviewed and shows a simple appearing right-sided pleural effusion and a more complex left-sided pleural effusion.  I reviewed with patient indications and risks of drainage. I explained her that I would recommend we place a right-sided chest tube as well as a left-sided thoracoscopy possible thoracotomy for drainage of her empyema. At the same time Dr. Cooper plans on opening her abdominal wound. There is no evidence of any significant intra-abdominal process on the CT scan and therefore it is hoped that this will allow the patient a quicker recovery with drainage of her bilateral pleural effusions.  The patient is agreeable to doing this. We will plan on performing this tomorrow. She will be transfused some blood today and have some available for tomorrow.  Tim Tuesday Terlecki 

## 2016-08-07 NOTE — Progress Notes (Signed)
8 Days Post-Op  Subjective: Patient became confused last night pulled out her nasogastric tube and pulled off her dressing which removed the Penrose drain from her midline incision. All drains are still intact.  This morning she is less confused and a CT scan is been ordered.  Objective: Vital signs in last 24 hours: Temp:  [97.6 F (36.4 C)-99.3 F (37.4 C)] 98.2 F (36.8 C) (03/06 0506) Pulse Rate:  [96-113] 108 (03/06 0506) Resp:  [16-38] 20 (03/06 0506) BP: (123-136)/(71-83) 136/77 (03/06 0506) SpO2:  [88 %-97 %] 93 % (03/06 0506) Weight:  [139 lb 6.4 oz (63.2 kg)] 139 lb 6.4 oz (63.2 kg) (03/06 0500) Last BM Date: 08/06/16  Intake/Output from previous day: 03/05 0701 - 03/06 0700 In: 2794.7 [I.V.:2197.7; IV Piggyback:597] Out: [id number] [Urine:5825; Emesis/NG output:10] Intake/Output this shift: No intake/output data recorded.  Physical exam:  Awake alert nasogastric tube is been replaced drains are intact and functional and serous only. There is purulence draining from the inferior caudad portion of the midline wound with no erythema present but there is obvious purulence draining from the inferior portion.  Lab Results: CBC   Recent Labs  08/06/16 0510 08/07/16 0228  WBC 30.9* 35.6*  HGB 7.4* 7.2*  HCT 23.1* 22.8*  PLT 557* 639*   BMET  Recent Labs  08/06/16 1211 08/07/16 0228  NA 135 135  K 3.1* 3.0*  CL 103 105  CO2 25 23  GLUCOSE 122* 116*  BUN 10 11  CREATININE 0.31* 0.34*  CALCIUM 7.1* 7.2*   PT/INR No results for input(s): LABPROT, INR in the last 72 hours. ABG No results for input(s): PHART, HCO3 in the last 72 hours.  Invalid input(s): PCO2, PO2  Studies/Results: Dg Abd 1 View  Result Date: 08/07/2016 CLINICAL DATA:  Nasogastric tube placement EXAM: ABDOMEN - 1 VIEW COMPARISON:  CT 08/01/2016 FINDINGS: Multiple abdominal drains are again evident. Nasogastric tube reaches the stomach but the proximal port may be at the EG junction.  Consolidation and effusion in both bases. IMPRESSION: 1. Nasogastric tube reaches the stomach but advancement by 5-10 cm is recommended for optimal placement, as the proximal port is probably at the EG junction. 2. Consolidation and effusion in both bases. 3. These results will be called to the ordering clinician or representative by the Radiologist Assistant, and communication documented in the PACS or zVision Dashboard. Electronically Signed   By: Andreas Newport M.D.   On: 08/07/2016 01:45   Dg Chest Port 1 View  Result Date: 08/07/2016 CLINICAL DATA:  Respiratory failure EXAM: PORTABLE CHEST 1 VIEW COMPARISON:  08/05/2016 FINDINGS: Nasogastric tube extends below the diaphragm. Right upper extremity PICC line extends to the cavoatrial junction. Central and basilar lung opacities persist, slightly improved with partial clearance since 08/05/2016. Pleural effusions persist bilaterally without significant interval change. IMPRESSION: 1. Nasogastric tube extends below the diaphragm. As described in the accompanying abdominal imaging, it should be advanced for optimal placement. 2. Persistent central and basilar airspace opacities with slight improvement. Persistent pleural effusions. Electronically Signed   By: Andreas Newport M.D.   On: 08/07/2016 01:47    Anti-infectives: Anti-infectives    Start     Dose/Rate Route Frequency Ordered Stop   08/05/16 1500  vancomycin (VANCOCIN) IVPB 1000 mg/200 mL premix  Status:  Discontinued     1,000 mg 200 mL/hr over 60 Minutes Intravenous Every 12 hours 08/05/16 0753 08/06/16 0850   08/05/16 0800  vancomycin (VANCOCIN) IVPB 1000 mg/200 mL premix  1,000 mg 200 mL/hr over 60 Minutes Intravenous  Once 08/05/16 0753 08/05/16 0923   07/30/16 1815  fluconazole (DIFLUCAN) IVPB 400 mg     400 mg 100 mL/hr over 120 Minutes Intravenous Every 24 hours 07/30/16 1743     07/30/16 1528  piperacillin-tazobactam (ZOSYN) 3.375 GM/50ML IVPB    Comments:  LEWIS, CINDY:  cabinet override      07/30/16 1528 07/30/16 1525   07/21/16 0000  piperacillin-tazobactam (ZOSYN) IVPB 3.375 g     3.375 g 12.5 mL/hr over 240 Minutes Intravenous Every 8 hours 07/20/16 1809     07/20/16 1430  piperacillin-tazobactam (ZOSYN) IVPB 3.375 g     3.375 g 100 mL/hr over 30 Minutes Intravenous  Once 07/20/16 1424 07/20/16 1603      Assessment/Plan: s/p Procedure(s): EXPLORATORY LAPAROTOMY drainage peritoneal abscess LYSIS OF ADHESION   White blood cell count is climbing and this may be due to her midline wound which is obviously infected but draining appropriately however at the need to look at her retroperitoneum in nature and Peritoneal cavity is to be addressed by the CT scan. The midline abdominal wound may need to be formally opened and placing a wound VAC at some point but that is to be determined. She will continues on TPN at this point and multiple antibiotics. I appreciate infectious diseases assistance by Dr. Ola Spurr.  Florene Glen, MD, FACS  08/07/2016

## 2016-08-08 ENCOUNTER — Inpatient Hospital Stay: Payer: BLUE CROSS/BLUE SHIELD | Admitting: Anesthesiology

## 2016-08-08 ENCOUNTER — Inpatient Hospital Stay: Payer: BLUE CROSS/BLUE SHIELD

## 2016-08-08 ENCOUNTER — Encounter: Payer: Self-pay | Admitting: Anesthesiology

## 2016-08-08 ENCOUNTER — Encounter
Admission: EM | Disposition: A | Discharge status: Home Health | Payer: Self-pay | Source: Home / Self Care | Attending: Surgery

## 2016-08-08 DIAGNOSIS — J869 Pyothorax without fistula: Secondary | ICD-10-CM

## 2016-08-08 HISTORY — PX: VIDEO ASSISTED THORACOSCOPY (VATS)/THOROCOTOMY: SHX6173

## 2016-08-08 HISTORY — PX: CHEST TUBE INSERTION: SHX231

## 2016-08-08 HISTORY — PX: DEBRIDEMENT OF ABDOMINAL WALL ABSCESS: SHX6396

## 2016-08-08 LAB — BASIC METABOLIC PANEL
Anion gap: 4 — ABNORMAL LOW (ref 5–15)
BUN: 14 mg/dL (ref 6–20)
CO2: 23 mmol/L (ref 22–32)
CREATININE: 0.3 mg/dL — AB (ref 0.44–1.00)
Calcium: 7 mg/dL — ABNORMAL LOW (ref 8.9–10.3)
Chloride: 108 mmol/L (ref 101–111)
GFR calc Af Amer: >60 mL/min (ref >60)
Glucose, Bld: 152 mg/dL — ABNORMAL HIGH (ref 65–99)
Potassium: 3.4 mmol/L — ABNORMAL LOW (ref 3.5–5.1)
SODIUM: 135 mmol/L (ref 135–145)

## 2016-08-08 LAB — GLUCOSE, CAPILLARY
GLUCOSE-CAPILLARY: 107 mg/dL — AB (ref 65–99)
GLUCOSE-CAPILLARY: 176 mg/dL — AB (ref 65–99)
GLUCOSE-CAPILLARY: 174 mg/dL — AB (ref 65–99)
Glucose-Capillary: 111 mg/dL — ABNORMAL HIGH (ref 65–99)
Glucose-Capillary: 118 mg/dL — ABNORMAL HIGH (ref 65–99)
Glucose-Capillary: 119 mg/dL — ABNORMAL HIGH (ref 65–99)
Glucose-Capillary: 141 mg/dL — ABNORMAL HIGH (ref 65–99)

## 2016-08-08 LAB — COMPREHENSIVE METABOLIC PANEL
ALBUMIN: 1.4 g/dL — AB (ref 3.5–5.0)
ALT: 18 U/L (ref 14–54)
AST: 27 U/L (ref 15–41)
Alkaline Phosphatase: 202 U/L — ABNORMAL HIGH (ref 38–126)
Anion gap: 9 (ref 5–15)
BILIRUBIN TOTAL: 0.5 mg/dL (ref 0.3–1.2)
BUN: 9 mg/dL (ref 6–20)
CHLORIDE: 104 mmol/L (ref 101–111)
CO2: 23 mmol/L (ref 22–32)
Calcium: 7.2 mg/dL — ABNORMAL LOW (ref 8.9–10.3)
GLUCOSE: 107 mg/dL — AB (ref 65–99)
POTASSIUM: 2.5 mmol/L — AB (ref 3.5–5.1)
Sodium: 136 mmol/L (ref 135–145)
Total Protein: 5.4 g/dL — ABNORMAL LOW (ref 6.5–8.1)

## 2016-08-08 LAB — CBC
HCT: 28.4 % — ABNORMAL LOW (ref 35.0–47.0)
Hemoglobin: 8.8 g/dL — ABNORMAL LOW (ref 12.0–16.0)
MCH: 23.9 pg — AB (ref 26.0–34.0)
MCHC: 30.9 g/dL — AB (ref 32.0–36.0)
MCV: 77.3 fL — ABNORMAL LOW (ref 80.0–100.0)
PLATELETS: 827 10*3/uL — AB (ref 150–440)
RBC: 3.68 MIL/uL — ABNORMAL LOW (ref 3.80–5.20)
RDW: 24.1 % — AB (ref 11.5–14.5)
WBC: 48.2 10*3/uL — ABNORMAL HIGH (ref 3.6–11.0)

## 2016-08-08 LAB — CBC WITH DIFFERENTIAL/PLATELET
BASOS ABS: 0 10*3/uL (ref 0–0.1)
Basophils Relative: 0 %
EOS PCT: 0 %
Eosinophils Absolute: 0 10*3/uL (ref 0–0.7)
HEMATOCRIT: 28.1 % — AB (ref 35.0–47.0)
Hemoglobin: 9.1 g/dL — ABNORMAL LOW (ref 12.0–16.0)
LYMPHS ABS: 1.1 10*3/uL (ref 1.0–3.6)
Lymphocytes Relative: 3 %
MCH: 25 pg — ABNORMAL LOW (ref 26.0–34.0)
MCHC: 32.3 g/dL (ref 32.0–36.0)
MCV: 77.4 fL — ABNORMAL LOW (ref 80.0–100.0)
MONOS PCT: 6 %
Monocytes Absolute: 2.2 10*3/uL — ABNORMAL HIGH (ref 0.2–0.9)
NEUTROS PCT: 91 %
Neutro Abs: 33.6 10*3/uL — ABNORMAL HIGH (ref 1.4–6.5)
PLATELETS: 691 10*3/uL — AB (ref 150–440)
RBC: 3.63 MIL/uL — AB (ref 3.80–5.20)
RDW: 24.5 % — AB (ref 11.5–14.5)
WBC: 36.9 10*3/uL — ABNORMAL HIGH (ref 3.6–11.0)

## 2016-08-08 LAB — POCT PREGNANCY, URINE: PREG TEST UR: NEGATIVE

## 2016-08-08 LAB — PHOSPHORUS: Phosphorus: 3.6 mg/dL (ref 2.5–4.6)

## 2016-08-08 LAB — MAGNESIUM: MAGNESIUM: 1.7 mg/dL (ref 1.7–2.4)

## 2016-08-08 LAB — POTASSIUM: POTASSIUM: 2.9 mmol/L — AB (ref 3.5–5.1)

## 2016-08-08 SURGERY — CHEST TUBE INSERTION
Anesthesia: General | Laterality: Right

## 2016-08-08 MED ORDER — LACTATED RINGERS IV SOLN
INTRAVENOUS | Status: DC | PRN
  Administered 2016-08-08 (×2): via INTRAVENOUS

## 2016-08-08 MED ORDER — FENTANYL CITRATE (PF) 100 MCG/2ML IJ SOLN
INTRAMUSCULAR | Status: AC
  Filled 2016-08-08: qty 2

## 2016-08-08 MED ORDER — SODIUM CHLORIDE 0.9 % IV SOLN
INTRAVENOUS | Status: DC | PRN
  Administered 2016-08-08: 17:00:00 via INTRAVENOUS

## 2016-08-08 MED ORDER — LIDOCAINE HCL (PF) 2 % IJ SOLN
INTRAMUSCULAR | Status: AC
  Filled 2016-08-08: qty 2

## 2016-08-08 MED ORDER — FAT EMULSION 20 % IV EMUL
250.0000 mL | INTRAVENOUS | Status: AC
  Administered 2016-08-08: 250 mL via INTRAVENOUS
  Filled 2016-08-08 (×2): qty 250

## 2016-08-08 MED ORDER — ROCURONIUM BROMIDE 50 MG/5ML IV SOLN
INTRAVENOUS | Status: AC
  Filled 2016-08-08: qty 1

## 2016-08-08 MED ORDER — FAT EMULSION 20 % IV EMUL
250.0000 mL | INTRAVENOUS | Status: AC
  Administered 2016-08-09: 250 mL via INTRAVENOUS
  Filled 2016-08-08: qty 250

## 2016-08-08 MED ORDER — PHENYLEPHRINE HCL 10 MG/ML IJ SOLN
INTRAMUSCULAR | Status: AC
  Filled 2016-08-08: qty 1

## 2016-08-08 MED ORDER — FENTANYL CITRATE (PF) 100 MCG/2ML IJ SOLN
25.0000 ug | INTRAMUSCULAR | Status: AC | PRN
  Administered 2016-08-08 (×6): 25 ug via INTRAVENOUS

## 2016-08-08 MED ORDER — ESTROGENS, CONJUGATED 0.625 MG/GM VA CREA
TOPICAL_CREAM | VAGINAL | Status: AC
  Filled 2016-08-08: qty 30

## 2016-08-08 MED ORDER — HYDROMORPHONE HCL 1 MG/ML IJ SOLN
INTRAMUSCULAR | Status: AC
  Filled 2016-08-08: qty 1

## 2016-08-08 MED ORDER — TRACE MINERALS CR-CU-MN-SE-ZN 10-1000-500-60 MCG/ML IV SOLN
INTRAVENOUS | Status: AC
  Administered 2016-08-08: 22:00:00 via INTRAVENOUS
  Filled 2016-08-08: qty 1992

## 2016-08-08 MED ORDER — ETOMIDATE 2 MG/ML IV SOLN
INTRAVENOUS | Status: AC
  Filled 2016-08-08: qty 10

## 2016-08-08 MED ORDER — FENTANYL CITRATE (PF) 100 MCG/2ML IJ SOLN
INTRAMUSCULAR | Status: DC | PRN
  Administered 2016-08-08: 25 ug via INTRAVENOUS
  Administered 2016-08-08: 100 ug via INTRAVENOUS
  Administered 2016-08-08: 25 ug via INTRAVENOUS
  Administered 2016-08-08: 50 ug via INTRAVENOUS

## 2016-08-08 MED ORDER — SODIUM CHLORIDE 0.9 % IV SOLN
30.0000 meq | Freq: Once | INTRAVENOUS | Status: AC
  Administered 2016-08-08 (×2): 30 meq via INTRAVENOUS
  Filled 2016-08-08: qty 15

## 2016-08-08 MED ORDER — HYDROMORPHONE HCL 1 MG/ML IJ SOLN
0.2500 mg | INTRAMUSCULAR | Status: DC | PRN
  Administered 2016-08-08 (×2): 0.5 mg via INTRAVENOUS
  Administered 2016-08-08 (×4): 0.25 mg via INTRAVENOUS

## 2016-08-08 MED ORDER — SUCCINYLCHOLINE CHLORIDE 20 MG/ML IJ SOLN
INTRAMUSCULAR | Status: DC | PRN
  Administered 2016-08-08: 120 mg via INTRAVENOUS

## 2016-08-08 MED ORDER — DEXAMETHASONE SODIUM PHOSPHATE 10 MG/ML IJ SOLN
INTRAMUSCULAR | Status: DC | PRN
  Administered 2016-08-08: 5 mg via INTRAVENOUS

## 2016-08-08 MED ORDER — SUCCINYLCHOLINE CHLORIDE 20 MG/ML IJ SOLN
INTRAMUSCULAR | Status: AC
  Filled 2016-08-08: qty 1

## 2016-08-08 MED ORDER — TRACE MINERALS CR-CU-MN-SE-ZN 10-1000-500-60 MCG/ML IV SOLN
INTRAVENOUS | Status: AC
  Administered 2016-08-09: 18:00:00 via INTRAVENOUS
  Filled 2016-08-08: qty 1992

## 2016-08-08 MED ORDER — MIDAZOLAM HCL 5 MG/5ML IJ SOLN
INTRAMUSCULAR | Status: DC | PRN
  Administered 2016-08-08 (×2): 1 mg via INTRAVENOUS

## 2016-08-08 MED ORDER — SODIUM CHLORIDE 0.9 % IV SOLN
30.0000 meq | Freq: Once | INTRAVENOUS | Status: AC
  Administered 2016-08-09: 30 meq via INTRAVENOUS
  Filled 2016-08-08: qty 15

## 2016-08-08 MED ORDER — ONDANSETRON HCL 4 MG/2ML IJ SOLN
INTRAMUSCULAR | Status: AC
  Filled 2016-08-08: qty 2

## 2016-08-08 MED ORDER — ACETAMINOPHEN 10 MG/ML IV SOLN
INTRAVENOUS | Status: DC | PRN
  Administered 2016-08-08: 1000 mg via INTRAVENOUS

## 2016-08-08 MED ORDER — BUPIVACAINE LIPOSOME 1.3 % IJ SUSP
INTRAMUSCULAR | Status: AC
  Filled 2016-08-08: qty 20

## 2016-08-08 MED ORDER — PHENYLEPHRINE HCL 10 MG/ML IJ SOLN
INTRAMUSCULAR | Status: DC | PRN
Start: 2016-08-08 — End: 2016-08-08
  Administered 2016-08-08: 150 ug via INTRAVENOUS
  Administered 2016-08-08: 50 ug via INTRAVENOUS
  Administered 2016-08-08: 100 ug via INTRAVENOUS
  Administered 2016-08-08: 50 ug via INTRAVENOUS
  Administered 2016-08-08 (×2): 200 ug via INTRAVENOUS
  Administered 2016-08-08: 150 ug via INTRAVENOUS
  Administered 2016-08-08 (×2): 100 ug via INTRAVENOUS

## 2016-08-08 MED ORDER — ORAL CARE MOUTH RINSE
15.0000 mL | Freq: Two times a day (BID) | OROMUCOSAL | Status: DC
  Administered 2016-08-08 – 2016-09-14 (×44): 15 mL via OROMUCOSAL

## 2016-08-08 MED ORDER — SUGAMMADEX SODIUM 200 MG/2ML IV SOLN
INTRAVENOUS | Status: DC | PRN
  Administered 2016-08-08: 120 mg via INTRAVENOUS

## 2016-08-08 MED ORDER — SODIUM CHLORIDE 0.9 % IV SOLN: 30.0000 meq | Freq: Once | INTRAVENOUS | Status: DC

## 2016-08-08 MED ORDER — LIDOCAINE HCL (CARDIAC) 20 MG/ML IV SOLN
INTRAVENOUS | Status: DC | PRN
  Administered 2016-08-08: 60 mg via INTRAVENOUS

## 2016-08-08 MED ORDER — FENTANYL CITRATE (PF) 100 MCG/2ML IJ SOLN
50.0000 ug | Freq: Once | INTRAMUSCULAR | Status: AC
  Administered 2016-08-08: 50 ug via INTRAVENOUS

## 2016-08-08 MED ORDER — MIDAZOLAM HCL 2 MG/2ML IJ SOLN
INTRAMUSCULAR | Status: AC
  Filled 2016-08-08: qty 2

## 2016-08-08 MED ORDER — PROPOFOL 10 MG/ML IV BOLUS
INTRAVENOUS | Status: AC
  Filled 2016-08-08: qty 20

## 2016-08-08 MED ORDER — SODIUM CHLORIDE 0.9 % IV SOLN
INTRAVENOUS | Status: DC | PRN
  Administered 2016-08-08: 75 ug/min via INTRAVENOUS

## 2016-08-08 MED ORDER — DEXAMETHASONE SODIUM PHOSPHATE 10 MG/ML IJ SOLN
INTRAMUSCULAR | Status: AC
  Filled 2016-08-08: qty 1

## 2016-08-08 MED ORDER — SODIUM CHLORIDE 0.9 % IV SOLN
30.0000 meq | Freq: Once | INTRAVENOUS | Status: AC
  Administered 2016-08-08: 30 meq via INTRAVENOUS
  Filled 2016-08-08: qty 15

## 2016-08-08 MED ORDER — SUGAMMADEX SODIUM 200 MG/2ML IV SOLN
INTRAVENOUS | Status: AC
  Filled 2016-08-08: qty 2

## 2016-08-08 MED ORDER — SODIUM CHLORIDE 0.9 % IV SOLN
INTRAVENOUS | Status: DC | PRN
  Administered 2016-08-08: 60 mL

## 2016-08-08 MED ORDER — NYSTATIN 100000 UNIT/GM EX CREA: TOPICAL_CREAM | Freq: Three times a day (TID) | CUTANEOUS | Status: DC

## 2016-08-08 MED ORDER — ONDANSETRON HCL 4 MG/2ML IJ SOLN: 4.0000 mg | Freq: Once | INTRAMUSCULAR | Status: DC | PRN

## 2016-08-08 MED ORDER — ETOMIDATE 2 MG/ML IV SOLN
INTRAVENOUS | Status: DC | PRN
  Administered 2016-08-08: 14 mg via INTRAVENOUS

## 2016-08-08 MED ORDER — ROCURONIUM BROMIDE 100 MG/10ML IV SOLN
INTRAVENOUS | Status: DC | PRN
  Administered 2016-08-08: 20 mg via INTRAVENOUS
  Administered 2016-08-08: 5 mg via INTRAVENOUS
  Administered 2016-08-08: 20 mg via INTRAVENOUS
  Administered 2016-08-08: 25 mg via INTRAVENOUS

## 2016-08-08 MED ORDER — ACETAMINOPHEN 10 MG/ML IV SOLN
INTRAVENOUS | Status: AC
  Filled 2016-08-08: qty 100

## 2016-08-08 SURGICAL SUPPLY — 97 items
BLADE SURG SZ11 CARB STEEL (BLADE) ×4 IMPLANT
BNDG COHESIVE 4X5 TAN STRL (GAUZE/BANDAGES/DRESSINGS) ×4 IMPLANT
BRONCHOSCOPE PED SLIM DISP (MISCELLANEOUS) ×4 IMPLANT
CANISTER SUCT 1200ML W/VALVE (MISCELLANEOUS) ×8 IMPLANT
CATH THORACIC RT ANG 28FR SOFT (CATHETERS) ×8 IMPLANT
CATH TRAY 16F METER LATEX (MISCELLANEOUS) IMPLANT
CATH URET ROBINSON 16FR STRL (CATHETERS) IMPLANT
CHLORAPREP W/TINT 26ML (MISCELLANEOUS) ×12 IMPLANT
CONN REDUCER 1/4X3/8 STR (CONNECTOR) ×8
CONN REDUCER 3/8X3/8X3/8Y (CONNECTOR) ×4
CONNECTOR REDUCER 1/4X3/8 STR (CONNECTOR) ×6 IMPLANT
CONNECTOR REDUCER 3/8X3/8X3/8Y (CONNECTOR) ×3 IMPLANT
COVER LIGHT HANDLE STERIS (MISCELLANEOUS) ×8 IMPLANT
CUTTER ECHEON FLEX ENDO 45 340 (ENDOMECHANICALS) IMPLANT
DEFOGGER SCOPE WARMER CLEARIFY (MISCELLANEOUS) ×4 IMPLANT
DRAIN CHANNEL 28F RND 3/8 FF (WOUND CARE) ×20 IMPLANT
DRAIN CHEST DRY SUCT SGL (MISCELLANEOUS) ×4 IMPLANT
DRAPE C-SECTION (MISCELLANEOUS) ×4 IMPLANT
DRAPE INCISE IOBAN 66X45 STRL (DRAPES) ×4 IMPLANT
DRAPE INCISE IOBAN 66X60 STRL (DRAPES) ×4 IMPLANT
DRAPE LAPAROTOMY 100X77 ABD (DRAPES) ×4 IMPLANT
DRAPE LAPAROTOMY 77X122 PED (DRAPES) IMPLANT
DRAPE MAG INST 16X20 L/F (DRAPES) ×4 IMPLANT
DRSG OPSITE POSTOP 3X4 (GAUZE/BANDAGES/DRESSINGS) IMPLANT
DRSG TEGADERM 6X8 (GAUZE/BANDAGES/DRESSINGS) ×4 IMPLANT
DRSG VAC ATS MED SENSATRAC (GAUZE/BANDAGES/DRESSINGS) ×4 IMPLANT
ELECT BLADE 6 FLAT ULTRCLN (ELECTRODE) ×4 IMPLANT
ELECT BLADE 6.5 EXT (BLADE) ×4 IMPLANT
ELECT CAUTERY BLADE TIP 2.5 (TIP) ×4
ELECT CAUTERY NEEDLE TIP 1.0 (MISCELLANEOUS) ×4
ELECT REM PT RETURN 9FT ADLT (ELECTROSURGICAL) ×8
ELECTRODE CAUTERY BLDE TIP 2.5 (TIP) ×3 IMPLANT
ELECTRODE CAUTERY NEDL TIP 1.0 (MISCELLANEOUS) ×3 IMPLANT
ELECTRODE REM PT RTRN 9FT ADLT (ELECTROSURGICAL) ×6 IMPLANT
GAUZE SPONGE 4X4 12PLY STRL (GAUZE/BANDAGES/DRESSINGS) ×8 IMPLANT
GLOVE BIO SURGEON STRL SZ 6.5 (GLOVE) ×24 IMPLANT
GLOVE BIO SURGEON STRL SZ8 (GLOVE) ×8 IMPLANT
GLOVE SURG SYN 7.5  E (GLOVE) ×6
GLOVE SURG SYN 7.5 E (GLOVE) ×18 IMPLANT
GOWN STRL REUS W/ TWL LRG LVL3 (GOWN DISPOSABLE) ×30 IMPLANT
GOWN STRL REUS W/TWL LRG LVL3 (GOWN DISPOSABLE) ×10
KIT PLEURX DRAIN CATH 15.5FR (DRAIN) IMPLANT
KIT RM TURNOVER STRD PROC AR (KITS) ×8 IMPLANT
LABEL OR SOLS (LABEL) ×8 IMPLANT
LOOP RED MAXI  1X406MM (MISCELLANEOUS) ×1
LOOP VESSEL MAXI 1X406 RED (MISCELLANEOUS) ×3 IMPLANT
MARKER SKIN DUAL TIP RULER LAB (MISCELLANEOUS) ×4 IMPLANT
NEEDLE FILTER BLUNT 18X 1/2SAF (NEEDLE) ×1
NEEDLE FILTER BLUNT 18X1 1/2 (NEEDLE) ×3 IMPLANT
NEEDLE SPNL 20GX3.5 QUINCKE YW (NEEDLE) ×4 IMPLANT
NS IRRIG 1000ML POUR BTL (IV SOLUTION) ×4 IMPLANT
NS IRRIG 500ML POUR BTL (IV SOLUTION) ×4 IMPLANT
PACK BASIN MAJOR ARMC (MISCELLANEOUS) ×8 IMPLANT
PACK BASIN MINOR ARMC (MISCELLANEOUS) ×4 IMPLANT
PAD ABD DERMACEA PRESS 5X9 (GAUZE/BANDAGES/DRESSINGS) ×4 IMPLANT
REMOVER STAPLE SKIN (DISPOSABLE) ×4 IMPLANT
SCISSORS METZENBAUM CVD 33 (INSTRUMENTS) IMPLANT
SPONGE KITTNER 5P (MISCELLANEOUS) ×8 IMPLANT
STAPLER SKIN PROX 35W (STAPLE) ×8 IMPLANT
STAPLER VASCULAR ECHELON 35 (CUTTER) ×4 IMPLANT
STRIP CLOSURE SKIN 1/2X4 (GAUZE/BANDAGES/DRESSINGS) ×4 IMPLANT
SUCTION FRAZIER HANDLE 10FR (MISCELLANEOUS)
SUCTION TUBE FRAZIER 10FR DISP (MISCELLANEOUS) IMPLANT
SUT CHROMIC 0 CT 1 (SUTURE) IMPLANT
SUT CHROMIC 3 0 PS 2 (SUTURE) ×4 IMPLANT
SUT CHROMIC BR 1/2CLE 2-0 54IN (SUTURE) IMPLANT
SUT ETHILON 3-0 FS-10 30 BLK (SUTURE)
SUT ETHILON 4-0 (SUTURE)
SUT ETHILON 4-0 FS2 18XMFL BLK (SUTURE)
SUT MAXON ABS #0 GS21 30IN (SUTURE) IMPLANT
SUT MNCRL AB 3-0 PS2 27 (SUTURE) ×8 IMPLANT
SUT PROLENE 1 CT 1 30 (SUTURE) ×36 IMPLANT
SUT SILK 1 SH (SUTURE) ×36 IMPLANT
SUT VIC AB 0 CT1 36 (SUTURE) ×8 IMPLANT
SUT VIC AB 0 SH 27 (SUTURE) ×4 IMPLANT
SUT VIC AB 2-0 CT1 27 (SUTURE) ×2
SUT VIC AB 2-0 CT1 TAPERPNT 27 (SUTURE) ×6 IMPLANT
SUT VIC AB 2-0 CT2 27 (SUTURE) ×8 IMPLANT
SUT VIC AB 2-0 SH 27 (SUTURE) ×1
SUT VIC AB 2-0 SH 27XBRD (SUTURE) ×3 IMPLANT
SUT VIC AB 3-0 SH 27 (SUTURE)
SUT VIC AB 3-0 SH 27X BRD (SUTURE) IMPLANT
SUT VICRYL 2 TP 1 (SUTURE) ×16 IMPLANT
SUTURE EHLN 3-0 FS-10 30 BLK (SUTURE) IMPLANT
SUTURE ETHLN 4-0 FS2 18XMF BLK (SUTURE) IMPLANT
SWAB DUAL CULTURE TRANS RED ST (MISCELLANEOUS) ×16 IMPLANT
SYR 30ML LL (SYRINGE) ×4 IMPLANT
SYR BULB IRRIG 60ML STRL (SYRINGE) ×4 IMPLANT
TAPE ADH 3 LX (MISCELLANEOUS) ×4 IMPLANT
TAPE TRANSPORE STRL 2 31045 (GAUZE/BANDAGES/DRESSINGS) ×4 IMPLANT
TROCAR FLEXIPATH 20X80 (ENDOMECHANICALS) IMPLANT
TROCAR FLEXIPATH THORACIC 15MM (ENDOMECHANICALS) IMPLANT
TUBING CHEST DRAIN (MISCELLANEOUS) ×4 IMPLANT
TUBING CONNECTING 10 (TUBING) ×4 IMPLANT
WATER STERILE IRR 1000ML POUR (IV SOLUTION) ×4 IMPLANT
WND VAC CANISTER 500ML (MISCELLANEOUS) ×4 IMPLANT
YANKAUER SUCT BULB TIP FLEX NO (MISCELLANEOUS) ×4 IMPLANT

## 2016-08-08 NOTE — Anesthesia Procedure Notes (Signed)
Procedure Name: Intubation Date/Time: 08/08/2016 3:28 PM Performed by: Dionne Bucy Pre-anesthesia Checklist: Patient identified, Patient being monitored, Timeout performed, Emergency Drugs available and Suction available Patient Re-evaluated:Patient Re-evaluated prior to inductionOxygen Delivery Method: Circle system utilized Preoxygenation: Pre-oxygenation with 100% oxygen Intubation Type: IV induction Ventilation: Mask ventilation without difficulty Laryngoscope Size: 3 and McGraph Grade View: Grade I Endobronchial tube: Double lumen EBT and Left and 37 Fr Number of attempts: 1 Airway Equipment and Method: Stylet Placement Confirmation: ETT inserted through vocal cords under direct vision,  positive ETCO2 and breath sounds checked- equal and bilateral Secured at: 31 cm Tube secured with: Tape Dental Injury: Teeth and Oropharynx as per pre-operative assessment

## 2016-08-08 NOTE — OR Nursing (Signed)
Received report from [place], [place] on the floor. Patient still receiving first IV bag of replacement potassium.  Dr. Randa Lynn notified of early morning level of 2.5.  Will recheck potassium level prior to bringing patient to [place] for surgery.  Operating room notified.

## 2016-08-08 NOTE — Op Note (Signed)
07/20/2016 - 08/08/2016  4:18 PM  PATIENT:  Lindsey Wolf  51 y.o. female  PRE-OPERATIVE DIAGNOSIS:  Wound sepsis  POST-OPERATIVE DIAGNOSIS:  Same  PROCEDURE: Debridement and wound VAC placement, midline abdominal wound  SURGEON:  Florene Glen MD, FACS   ANESTHESIA:  Gen. with endotracheal tube  This a patient with a history of a perforated ulcer multiple abdominal abscesses who now presents with bilateral pleural effusions left is loculated and she has wound sepsis from her midline wound. She had pulled out her Penrose drain previously but continues to roll purulence from her wound. Preoperatively we discussed the rationale for surgery the options of observation risk bleeding infection recurrence and the need for wound VAC placement she understood and agreed to proceed.  Findings wound sepsis with necrotic fascia but no sign of evisceration cultures were obtained 2.   Details of Procedure: Patient was induced general anesthesia by endotracheal tube with a double-lumen tube for the following procedure of right chest tube placement and left thoracoscopy by Dr. Faith Rogue which will be detailed and dictated separately.  She was under general anesthesia timeout was held and then the staples were removed. The inferior portion of the wound was cultured and then the wound edges that had been sealed were opened up bluntly without difficulty as they were not very well sealed immediate findings of purulence and necrotic fascia was identified. This area was cultured as well for a second culture from the wound.  Rough debridement with the utilization of a laparotomy pad was performed to remove the fibular no purulent exudate over the fascia sutures were present but not completely intact.  Was no sign of evisceration.  A wound VAC was placed in a standard fashion with no bowel identified at the time of surgery. Wound VAC sealed properly. The patient was left in stable but critical condition in  the operating room for Dr. Faith Rogue to place a right chest tube and a left thoracoscopy video assistance.  Estimated blood loss nil   Florene Glen, MD FACS

## 2016-08-08 NOTE — Op Note (Signed)
07/20/2016 - 08/08/2016  7:52 PM  PATIENT:  Lindsey Wolf  51 y.o. female  PRE-OPERATIVE DIAGNOSIS:  Bilateral pleural effusions  POST-OPERATIVE DIAGNOSIS:  #1 simple pleural effusion right side #2 empyema left chest  PROCEDURE:  Preoperative bronchoscopy to assess endobronchial anatomy #2 insertion of right-sided chest tube #3 decortication of visceral parietal pleura left side  SURGEON:  Surgeon(s) and Role: Panel 1:    * [name], MD - Primary  Panel 2:    * Florene Glen, MD - Primary  ASSISTANTS: Gardiner Sleeper physician's assistant student  ANESTHESIA: Gen.  INDICATIONS FOR PROCEDURE this is a 51 year old woman whose have multiple intra-abdominal procedures and now has a rising white blood cell count with a CT scan showing large bilateral pleural effusions. Pleural effusion on the left was complicated with some air present as well as some enhancement of the pleural space. For this reason it was felt that there was probably a simple pleural effusion on the right and had complicated empyema on the left. Our intraoperative findings confirmed this. Was explained to the patient preoperatively and she gave her consent to proceed on with the following procedure.  DICTATION: The patient was brought to the operating suite and placed in the supine position. General endotracheal anesthesia was given through a double-lumen tube. Preoperative bronchoscopy was carried out and the tubes in good position. There is no evidence of endobronchial tumor. There was no evidence of purulent secretions. The midline wound was then opened by Dr. Burt Knack and this will be dictated under separate cover.  The right chest was then prepped and draped in usual sterile fashion. A small skin incision was made and a hemostat was used to tunnel up several interspaces until the pleural space was entered. Upon entering the pleural space there is a large amount of free-flowing slightly serosanguineous fluid. I  attempted to place a Keenan Bachelor drain however it was not stiff enough to penetrate through the soft tissues and into the chest so therefore a 20 French chest tube was inserted. This was an angled tube and we attempted to position it inferiorly. The tube was secured to the skin with #1 silk and a pursestring. It was connected to suction and approximately 500 cc of serosanguineous fluid issued forth. There was no air leak.  We then repositioned the patient and turned her for a left-sided thoracoscopy. The patient was prepped and draped in usual sterile fashion. We began by making a small skin incision anteriorly in the posterior axillary line. Incision was deepened down through the muscles of the chest wall to the pleural space was entered. Upon entering the pleural space there is a large amount of pus. Eighth suction catheter was placed into the chest and about 500 cc of thick purulent material was removed. We then placed a thoracoscope through our incision and it was clear that the procedure could not be performed with a thoracoscopy alone and we therefore extended our incision posteriorly towards the tip of the scapula. Once this was complete we then placed a small tooth a retractor and opened the chest. There is a very large and thick rind inferiorly within the fissure and anteriorly. All 3 of these areas were widely opened and the lung was freed up from the mediastinum. There was no evidence of intra-thoracic malignancy. The majority of the purulent secretions were present inferiorly along the diaphragmatic surface. The entire lung was decorticated and the chest was then copiously irrigated. Appropriate cultures were sent. [place] drains were  positioned one anteriorly and one posteriorly and one along the diaphragm. The lead along the diaphragm was held in place with a single suture of 3-0 chromic on the rind and around the tube. The tubes were secured to the skin and then the chest was closed. #2 Vicryl  pericostal sutures were used to approximate the ribs. The serratus and latissimus muscles were closed with #2 Vicryl the subcutaneous tissues with 2-0 Vicryl and the skin with skin clips. Sterile dressings were applied.  The patient was then extubated and taken to the recovery room in stable condition. A chest x-ray obtained in the recovery room showed good expansion of both lungs with no evidence of pneumothorax or pleural effusion.    [name], MD

## 2016-08-08 NOTE — H&P (View-Only) (Signed)
I was asked by Dr. Burt Knack to review this patient's CT scan. She is a 51 year old woman with a complicated past medical history including several abdominal procedures for gastric ulcers. She also had bilateral pleural effusions and had a left-sided chest tube placed in the past. This drained purulent material for several days but ultimately the tube was removed. She has had a steadily increasing white blood cell count and a repeat CT scan the chest and abdomen revealed a multiloculated left-sided pleural effusion with pleural enhancement and air within the pleural effusion. This was thought to be consistent with a empyema. I was asked to see her for consideration of drainage.  On exam her prior chest tube site is well-healed. She has a large midline abdominal incision with some areas of drainage. Her CT scan was independently reviewed and shows a simple appearing right-sided pleural effusion and a more complex left-sided pleural effusion.  I reviewed with patient indications and risks of drainage. I explained her that I would recommend we place a right-sided chest tube as well as a left-sided thoracoscopy possible thoracotomy for drainage of her empyema. At the same time Dr. Burt Knack plans on opening her abdominal wound. There is no evidence of any significant intra-abdominal process on the CT scan and therefore it is hoped that this will allow the patient a quicker recovery with drainage of her bilateral pleural effusions.  The patient is agreeable to doing this. We will plan on performing this tomorrow. She will be transfused some blood today and have some available for tomorrow.  Tim [employer]

## 2016-08-08 NOTE — Progress Notes (Signed)
PT Cancellation Note  Patient Details Name: Lindsey Wolf MRN: [id number] DOB: 1965/12/08   Cancelled Treatment:    Reason Eval/Treat Not Completed: Other (comment). Per chart review. Pt plans to go to OR this date for Chest tube placement and possible thoracotomy along with possible abdominal surgery. Will hold therapy this date. Pending change in status, may need new orders for resumption of therapy treatment.   Bryant Saye 08/08/2016, 11:56 AM  Greggory Stallion, PT, DPT [phone]

## 2016-08-08 NOTE — Progress Notes (Signed)
Preoperative Review    The history is reviewed in the chart and with the patient. I personally reviewed the options and rationale as well as the risks of this procedure that have been previously discussed with the patient. All questions asked by the patient and/or family were answered to their satisfaction.  Patient agrees to proceed with this procedure at this time.  Florene Glen M.D. FACS

## 2016-08-08 NOTE — Progress Notes (Signed)
Pt arrived from PACU alert and awake with occasional dosing . Pt has [place] at 3L sats WNL.pt has midline incision with wound vac in place @125mmhg  with scant drainage. Pt also has 3 JP drains noted to Abd area. Pt has chest tube Y-connection to Right side drainage noted to site marked upon arrival drainage appears old Ct at -20 and set up to suction. Chest tube to left side Y connection no drainage noted set at -40cm and set to wall suction . Will cont to monitor

## 2016-08-08 NOTE — Progress Notes (Signed)
I discussed with anesthesia the patient's repeated potassium level after repletion of 2.9. While this operation is not a true emergency it is certainly an urgency and this patient is at 6 considerable risk for sepsis and SIRS. Drainage of her chest is absolutely necessary today and we should not delay this any longer. Anesthesia agreed to give the patient IV potassium replacement in the operating room on a monitor. We will proceed with surgery as planned and as discussed previously with the patient. Dr. Faith Rogue was in agreement with this plan as well

## 2016-08-08 NOTE — Progress Notes (Signed)
PHARMACY - ADULT TOTAL PARENTERAL NUTRITION CONSULT NOTE   Pharmacy Consult for TPN/ electrolytes Indication: Perforated ulcer  Patient Measurements: Height: 5\' 10"  (177.8 cm) Weight: 157 lb 13.6 oz (71.6 kg) IBW/kg (Calculated) : 68.5 TPN AdjBW (KG): 58.8 Body mass index is 22.65 kg/m.  CMP Latest Ref Rng & Units 08/08/2016 08/07/2016 08/06/2016  Glucose 65 - 99 mg/dL 107(H) 116(H) 122(H)  BUN 6 - 20 mg/dL 9 11 10   Creatinine 0.44 - 1.00 mg/dL <0.30(L) 0.34(L) 0.31(L)  Sodium 135 - 145 mmol/L 136 135 135  Potassium 3.5 - 5.1 mmol/L 2.5(LL) 3.0(L) 3.1(L)  Chloride 101 - 111 mmol/L 104 105 103  CO2 22 - 32 mmol/L 23 23 25   Calcium 8.9 - 10.3 mg/dL 7.2(L) 7.2(L) 7.1(L)  Total Protein 6.5 - 8.1 g/dL 5.4(L) - -  Total Bilirubin 0.3 - 1.2 mg/dL 0.5 - -  Alkaline Phos 38 - 126 U/L 202(H) - -  AST 15 - 41 U/L 27 - -  ALT 14 - 54 U/L 18 - -      Magnesium  Date Value Ref Range Status  08/08/2016 1.7 1.7 - 2.4 mg/dL Final   Phosphorus  Date Value Ref Range Status  08/08/2016 3.6 2.5 - 4.6 mg/dL Final    Assessment:Pharmacy consulted to assist in the management of electrolytes and glucose in this 51 year old female being starting on parenteral nutrition. Patient extubated on 3/1. Transferred out of ICU on 3/5.     Plan: Continue Clinimix E 5/20to 83 ml/hrwith Trace elements and MVI along with lipids 20% @ 20 ml/hr for 12 hours daily per dietary's recommendations.   Potassium replaced with KCl 30 meq IV x 2.  Will f/u at 1800 and replace as needed.     Has not received any SSI in 24 hours, continue Q4H accuchecks.  Pernell Dupre, PharmD, BCPS Clinical Pharmacist 08/08/2016 12:55 PM

## 2016-08-08 NOTE — Progress Notes (Signed)
[facility] NAME: Lindsey Wolf    MR#:  [id number]  DATE OF BIRTH:  August 10, 1965  SUBJECTIVE:  Patient's CT yesterday shows complex left sided pleural affusion Going to OR today  REVIEW OF SYSTEMS:    Review of Systems  Constitutional: Positive for malaise/fatigue. Negative for chills and fever.  HENT: Negative.  Negative for ear discharge, ear pain, hearing loss, nosebleeds and sore throat.   Eyes: Negative.  Negative for blurred vision and pain.  Respiratory: Negative.  Negative for cough, hemoptysis, shortness of breath and wheezing.   Cardiovascular: Negative.  Negative for chest pain, palpitations and leg swelling.  Gastrointestinal: Positive for abdominal pain. Negative for blood in stool, diarrhea, nausea and vomiting.  Genitourinary: Negative.  Negative for dysuria.  Musculoskeletal: Negative for back pain.  Skin: Negative.   Neurological: Positive for weakness. Negative for dizziness, tremors, speech change, focal weakness, seizures and headaches.  Endo/Heme/Allergies: Negative.  Does not bruise/bleed easily.  Psychiatric/Behavioral: Negative.  Negative for depression, hallucinations and suicidal ideas.     Tolerating Diet: TPN      DRUG ALLERGIES:  No Known Allergies  VITALS:  Blood pressure 128/76, pulse (!) 102, temperature 97.8 F (36.6 C), resp. rate 18, height 5\' 10"  (1.778 m), weight 59.7 kg (131 lb 9.6 oz), SpO2 98 %.  PHYSICAL EXAMINATION:   Physical Exam  Constitutional: She is well-developed, well-nourished, and in no distress. No distress.  HENT:  Head: Normocephalic.  Eyes: No scleral icterus.  Neck: Normal range of motion. Neck supple. No JVD present. No tracheal deviation present.  Cardiovascular: Normal rate, regular rhythm and normal heart sounds.  Exam reveals no gallop and no friction rub.   No murmur heard. Pulmonary/Chest: Effort normal and breath sounds normal. No respiratory  distress. She has no wheezes. She has no rales. She exhibits no tenderness.  Abdominal: She exhibits no distension and no mass. There is no tenderness. There is no rebound and no guarding.  2 jp drain Has 3 large bandages covering wounds.  Musculoskeletal: Normal range of motion. She exhibits no edema.  Neurological: She is alert.  Skin: Skin is warm. No rash noted. No erythema.      LABORATORY PANEL:   CBC  Recent Labs Lab 08/08/16 0607  WBC 36.9*  HGB 9.1*  HCT 28.1*  PLT 691*   ------------------------------------------------------------------------------------------------------------------  Chemistries   Recent Labs Lab 08/08/16 0607  NA 136  K 2.5*  CL 104  CO2 23  GLUCOSE 107*  BUN 9  CREATININE <0.30*  CALCIUM 7.2*  MG 1.7  AST 27  ALT 18  ALKPHOS 202*  BILITOT 0.5   ------------------------------------------------------------------------------------------------------------------  Cardiac Enzymes No results for input(s): TROPONINI in the last 168 hours. ------------------------------------------------------------------------------------------------------------------  RADIOLOGY:  Dg Abd 1 View  Result Date: 08/07/2016 CLINICAL DATA:  Nasogastric tube placement EXAM: ABDOMEN - 1 VIEW COMPARISON:  CT 08/01/2016 FINDINGS: Multiple abdominal drains are again evident. Nasogastric tube reaches the stomach but the proximal port may be at the EG junction. Consolidation and effusion in both bases. IMPRESSION: 1. Nasogastric tube reaches the stomach but advancement by 5-10 cm is recommended for optimal placement, as the proximal port is probably at the EG junction. 2. Consolidation and effusion in both bases. 3. These results will be called to the ordering clinician or representative by the Radiologist Assistant, and communication documented in the PACS or zVision Dashboard. Electronically Signed   By: Andreas Newport M.D.   On: 08/07/2016  01:45   Ct Abdomen Pelvis  W Contrast  Result Date: 08/07/2016 CLINICAL DATA:  Perforated peptic ulcer on 02/16. Peritonitis. Multiple abscesses. Right upper quadrant abscess drainage. Repeat exploratory laparotomy on 02/26. EXAM: CT ABDOMEN AND PELVIS WITH CONTRAST TECHNIQUE: Multidetector CT imaging of the abdomen and pelvis was performed using the standard protocol following bolus administration of intravenous contrast. CONTRAST:  159mL ISOVUE-300 IOPAMIDOL (ISOVUE-300) INJECTION 61% COMPARISON:  08/02/2015 abdominopelvic CT. Chest radiograph of 08/08/2015. FINDINGS: Lower chest: Dependent bibasilar opacities. Atelectasis on the right, similar. Slight increase in left base airspace disease. There is also a right middle lobe ground-glass opacity with mild septal thickening. Normal heart size. Small to moderate right pleural effusion is similar. Left-sided effusion is increased with areas of pleural air within. Left-sided pleural thickening and hyper enhancement are increased. Loculated component of the left-sided pleural effusion including antromedially, along the left heart border. 9.0 x 5.3 cm today versus 10.5 x 4.9 cm on the prior. Left pleural catheter no longer identified. Hepatobiliary: Left hepatic lobe cyst. Normal gallbladder, without biliary ductal dilatation. Pancreas: Normal, without mass or ductal dilatation. Tiny periampullary duodenal diverticulum. Spleen: Subcapsular perisplenic collection measures maximally 5.5 cm on image 24/series 2. Compare 6.1 cm on the prior. Adrenals/Urinary Tract: Mild left adrenal thickening. Normal right adrenal. Normal kidneys, without hydronephrosis. Foley catheter in the urinary bladder. Stomach/Bowel: Nasogastric terminates at the body of the stomach. Gastric underdistention. Apparent wall thickening is likely secondary. Sigmoid is also underdistended. Normal terminal ileum and appendix. Normal caliber of small bowel loops. Vascular/Lymphatic: Aortic and branch vessel atherosclerosis. No  abdominopelvic adenopathy. Reproductive: New small ocular lobes of air within the uterus, including on image 80/series 2. Likely endometrial, when correlated with sagittal image 67. No adnexal mass. Other: Multiple drains are again identified. Felt to be similar in configuration to on the prior. Perihepatic loculated ascites with peritoneal thickening is slightly decreased, including along the right lobe of the liver on image 42/series 2. A right anterior abdominopelvic collection is nearly resolved, measuring 3.4 x 0.8 cm today on image 63/series 2. Compare 7.6 x 1.7 cm on the prior. A left pelvic sidewall collection measures 4.1 x 1.1 cm today versus 4.1 x 1.5 cm on the prior exam (when remeasured). No new collections are identified. Laparotomy staples. Anasarca. Fluid underneath the midline laparotomy staples with minimal gas. No well-defined collection. Musculoskeletal: No acute osseous abnormality. IMPRESSION: 1. Overall response to therapy within the abdomen and pelvis. Decrease in size of multiple fluid collections as detailed above. 2. Mildly worsened appearance of the left pleural space with increase in pleural fluid and thickening persistent loculated components, especially anteromedially. New left pleural air with interval removal of pleural drain. This area could relate to recent instrumentation. However, fistulous communication to the lungs cannot be excluded. 3. Right base atelectasis. Right middle lobe and possible left lower lobe pneumonia. A right-sided pleural effusion is unchanged and more simple in appearance. 4. New air within the endometrium is indeterminate. Correlate with symptoms to suggest endometritis. This could theoretically be postoperative in appearance (communication from the intraperitoneal space). Electronically Signed   By: Abigail Miyamoto M.D.   On: 08/07/2016 12:05   Dg Chest Port 1 View  Result Date: 08/07/2016 CLINICAL DATA:  Respiratory failure EXAM: PORTABLE CHEST 1 VIEW  COMPARISON:  08/05/2016 FINDINGS: Nasogastric tube extends below the diaphragm. Right upper extremity PICC line extends to the cavoatrial junction. Central and basilar lung opacities persist, slightly improved with partial clearance since 08/05/2016. Pleural effusions persist bilaterally  without significant interval change. IMPRESSION: 1. Nasogastric tube extends below the diaphragm. As described in the accompanying abdominal imaging, it should be advanced for optimal placement. 2. Persistent central and basilar airspace opacities with slight improvement. Persistent pleural effusions. Electronically Signed   By: Andreas Newport M.D.   On: 08/07/2016 01:47     ASSESSMENT AND PLAN:    51 year old female admitted with the perforation peptic ulcer on fingers 16th and underwent emergent surgery with findings of purulent peritonitis who has had a complicated course.  1. Perforated peptic ulcer with complicated course since admission: Plan for right chest tube andleft-sided thoracoscopy possible thoracotomy for drainage of her empyema. And opening of abdominal wound.  Continue Zosyn and fluconazole as requested by ID Follow up on culture from drainage around the incision.  2. Hypokalemia:Needs repletion prior to surgery  3. Acute blood loss anemia: Patient transfused yesterday. Hgb up to 9 this am  4. Severe sepsis on admission: Resolved  5. PAF: Resolved       CODE STATUS: FULL  TOTAL TIME TAKING CARE OF THIS PATIENT: 22 minutes.     POSSIBLE D/C ??, DEPENDING ON CLINICAL CONDITION.   Jayant Kriz M.D on 08/08/2016 at 9:38 AM  Between 7am to 6pm - Pager - [phone] After 6pm go to [email or web address] - password [email or web address] [facility]  Office  [phone]  CC: Primary care physician; No PCP Per Patient  Note: This dictation was prepared with Dragon dictation along with smaller phrase technology. Any transcriptional errors that result from this process are  unintentional.

## 2016-08-08 NOTE — Anesthesia Preprocedure Evaluation (Addendum)
Anesthesia Evaluation  Patient identified by MRN, date of birth, ID band Patient awake    Reviewed: Allergy & Precautions, NPO status , Patient's Chart, lab work & pertinent test results, reviewed documented beta blocker date and time   Airway Mallampati: III  TM Distance: >3 FB     Dental  (+) Chipped   Pulmonary Current Smoker,           Cardiovascular + dysrhythmias      Neuro/Psych    GI/Hepatic PUD,   Endo/Other    Renal/GU      Musculoskeletal   Abdominal   Peds  (+) mental retardation Hematology   Anesthesia Other Findings K is now 2.9. Discussed with Dr. Burt Knack, he cannot wait indefintely on this pt as she could get septic from her abdominal wound. Will give her infusion of K in the OR. She has L pleural effusion with increased HR. Hb 9.1. PACs. Perforated ulcer with abdominal abscess. Some confusion earlier.  Reproductive/Obstetrics                            Anesthesia Physical Anesthesia Plan  ASA: IV  Anesthesia Plan: General   Post-op Pain Management:    Induction: Intravenous  Airway Management Planned: Double Lumen EBT  Additional Equipment:   Intra-op Plan:   Post-operative Plan:   Informed Consent: I have reviewed the patients History and Physical, chart, labs and discussed the procedure including the risks, benefits and alternatives for the proposed anesthesia with the patient or authorized representative who has indicated his/her understanding and acceptance.     Plan Discussed with: CRNA  Anesthesia Plan Comments: (I have seen Lindsey Wolf today and again reviewed the indications risks of right chest tube insertion and left thoracoscopy possible thoracotomy with decortication of lung.  Her potassium has been low and has been replenished throughout the morning with a recent potassium of 2.9.  I agree with Dr. Burt Knack that it is necessary to proceed with the  above procedure because of the need to drain/correct the infection in her left chest.  Dr. Burt Knack will also open her midline wound for drainage/debridement with possible wound vac coverage.  Lindsey Wolf is aware of the risks and wishes to proceed.  Marta Lamas.  )       Anesthesia Quick Evaluation

## 2016-08-08 NOTE — Interval H&P Note (Signed)
History and Physical Interval Note:  08/08/2016 2:27 PM  Lindsey Wolf  has presented today for surgery, with the diagnosis of Pleural effusion  The various methods of treatment have been discussed with the patient and family. After consideration of risks, benefits and other options for treatment, the patient has consented to  Procedure(s): CHEST TUBE INSERTION (Right) VIDEO ASSISTED THORACOSCOPY (VATS)/THOROCOTOMY Possible Thoracotomy (Left) DEBRIDEMENT OF ABDOMINAL WALL ABSCESS (N/A) as a surgical intervention .  The patient's history has been reviewed, patient examined, no change in status, stable for surgery.  I have reviewed the patient's chart and labs.  Questions were answered to the patient's satisfaction.     [name]

## 2016-08-08 NOTE — Anesthesia Postprocedure Evaluation (Signed)
Anesthesia Post Note  Patient: Lindsey Wolf  Procedure(s) Performed: Procedure(s) (LRB): CHEST TUBE INSERTION (Right) VIDEO ASSISTED THORACOSCOPY (VATS)/THOROCOTOMY Possible Thoracotomy (Left) DEBRIDEMENT OF ABDOMINAL WALL ABSCESS (N/A)  Patient location during evaluation: PACU Anesthesia Type: General Level of consciousness: awake and alert Pain management: pain level controlled Vital Signs Assessment: post-procedure vital signs reviewed and stable Respiratory status: spontaneous breathing, nonlabored ventilation, respiratory function stable and patient connected to nasal cannula oxygen Cardiovascular status: blood pressure returned to baseline and stable Postop Assessment: no signs of nausea or vomiting Anesthetic complications: no     Last Vitals:  Vitals:   08/08/16 1439 08/08/16 1916  BP: (!) 146/79   Pulse: (!) 101   Resp:  (!) (P) 29  Temp: 37.4 C (P) 36.4 C    Last Pain:  Vitals:   08/08/16 1439  TempSrc: Tympanic  PainSc: 7                  Broadus John K Miabella Shannahan

## 2016-08-08 NOTE — Anesthesia Post-op Follow-up Note (Cosign Needed)
Anesthesia QCDR form completed.        

## 2016-08-08 NOTE — Progress Notes (Addendum)
PULMONARY / CRITICAL CARE MEDICINE   Name: Lindsey Wolf MRN: [id number] DOB: 23-Nov-1965    ADMISSION DATE:  07/20/2016 CONSULTATION DATE:  08/08/16  REFERRING MD:  Dr. Genevive Bi  CHIEF COMPLAINT:  Pleural  effussion  Patient Profile Lindsey Wolf is a 51 year old female with recurrent bilateral  pleural effusion -s/p insertion of right sided chest tube and decortication of visceral and parietal pleura Patient has multiple perforated ulcers with abdominal abscesses and severe sepsis.Her abdominal wound continues to drain purulent drainage therefore patient went to the OR on 3/7 for placement of [place] in the midline under anesthesia,debridement, and  wound VAC placement.  REVIEW OF SYSTEMS:   Unable to obtain as the patient is drowsy post operatively  SUBJECTIVE:  Unable to obtain as the patient is drowsy post operatively  VITAL SIGNS: BP 101/69   Pulse 85   Temp 98.6 F (37 C) (Axillary)   Resp (!) 32   Ht 5\' 10"  (1.778 m)   Wt 59.4 kg (131 lb)   SpO2 98%   BMI 18.80 kg/m   HEMODYNAMICS:    VENTILATOR SETTINGS:    INTAKE / OUTPUT: I/O last 3 completed shifts: In: 7241.4 [I.V.:5455.4; Blood:441; NG/GT:480; IV Piggyback:865] Out: 9409.5 [Urine:8800; Drains:7.5; Other:500; Stool:2; Blood:100]  PHYSICAL EXAMINATION: HEENT:Within normal limits Neck: No jugular venous distention noted Chest: Clear,no wheezes, crackles, rhonchi noted Cardiac:  S1S2,Regular , NO m/r/g noted Abdomen: Surgical dressing noted,  Wound vac, multiple JP drains, mildly tender diffusely, active  No bowel sounds  Extremities: No edema, warm Neuro: No focal deficits, diffusely weak LABS:  BMET  Recent Labs Lab 08/06/16 1211 08/07/16 0228 08/08/16 0607 08/08/16 1335  NA 135 135 136  --   K 3.1* 3.0* 2.5* 2.9*  CL 103 105 104  --   CO2 25 23 23   --   BUN 10 11 9   --   CREATININE 0.31* 0.34* <0.30*  --   GLUCOSE 122* 116* 107*  --     Electrolytes  Recent Labs Lab 08/06/16 0510  08/06/16 1211 08/07/16 0228 08/08/16 0607  CALCIUM 7.2* 7.1* 7.2* 7.2*  MG 1.7 1.9 1.7 1.7  PHOS 3.6 3.5  --  3.6    CBC  Recent Labs Lab 08/06/16 0510 08/07/16 0228 08/08/16 0607  WBC 30.9* 35.6* 36.9*  HGB 7.4* 7.2* 9.1*  HCT 23.1* 22.8* 28.1*  PLT 557* 639* 691*    Coag's No results for input(s): APTT, INR in the last 168 hours.  Sepsis Markers No results for input(s): LATICACIDVEN, PROCALCITON, O2SATVEN in the last 168 hours.  ABG  Recent Labs Lab 08/02/16 0915  PHART 7.41  PCO2ART 51*  PO2ART 64*    Liver Enzymes  Recent Labs Lab 08/03/16 0348 08/06/16 1211 08/08/16 0607  AST 36  --  27  ALT 19  --  18  ALKPHOS 155*  --  202*  BILITOT <0.1*  --  0.5  ALBUMIN 1.1* 1.2* 1.4*    Cardiac Enzymes No results for input(s): TROPONINI, PROBNP in the last 168 hours.  Glucose  Recent Labs Lab 08/08/16 0011 08/08/16 0327 08/08/16 0738 08/08/16 1117 08/08/16 1959 08/08/16 2106  GLUCAP 111* 119* 107* 118* 176* 174*    Imaging Dg Chest Port 1 View  Result Date: 08/08/2016 CLINICAL DATA:  Thoracotomy, chest tube placement EXAM: PORTABLE CHEST 1 VIEW COMPARISON:  08/07/2016 and 0057 hours FINDINGS: Skin staples project over the periphery of the left hemithorax from recent thoracotomy with bilateral chest tubes in place, one  on the right with side port projecting over the right cardiophrenic angle and what appear to be potentially three tubes on the left, two projecting up to the left lung apex and one along the periphery of the right lung base. A surgical drain also projects over the liver shadow berneath the right hemidiaphragm. Heart is borderline enlarged with mild interstitial edema. Right side PICC line tip in the proximal right atrium. Streaky atelectasis is noted at the left lung base and/or scarring with interval decrease in left-sided pleural fluid. No significant pneumothorax. Gastric tube has been removed. IMPRESSION: Status post left thoracotomy  with bilateral chest tubes in place. Stable cardiomegaly with interval decrease in left-sided pleural fluid. No significant pneumothorax identified. Surgical drain projects over right upper quadrant of the abdomen. Electronically Signed   By: Ashley Royalty M.D.   On: 08/08/2016 20:01    SIGNIFICANT EVENTS/TEST RESULTS: 07/20/2016 CT abdomen: Extensive free intraperitoneal air and fluid compatible with perforated viscus 07/20/16 Laparotomy: Laparotomy with Repair of gastric ulcers x 2 using both omental and Falciform as a patch 07/25/2016 CT abdomen: Recently perforated gastric ulcers with extensive organized Large subcapsular hepatic abscess encompassing the right lobe. 9 cm lesser sac collection which contains leaked contrast from UGI 2 days ago.  6 cm left subdiaphragmatic abscess. Insinuating abscess in the ventral right lower quadrant peritoneum. Moderate pleural effusions and multi segment atelectasis. 07/25/16  CT IMAGE GUIDED DRAINAGE BY PERCUTANEOUS CATHETER  07/27/2016 CT abdomen: Persistent multifocal intra-abdominal fluid collections likely reflecting abscesses. The large right subcapsular hepatic abscess is similar to perhaps minimally improved despite percutaneous drain placement. Unchanged to slightly larger left subdiaphragmatic perisplenic abscess. Unchanged fluid, gas and extravasated contrast collection in the lesser sac. Unchanged insinuating right lower quadrant peritoneal abscess 07/30/16: Laparotomy for drainage or peritoneal abscess, lysis of adhesions 07/30/16: re-intubated post op for respiratory distress 07/30/16:  L chest tube placed for large L pleural effusion. Removed 08/04/16.  07/31/16: AFRVR (new onset). Resolved with amiodarone 08/01/2016 CT abdomen: Postsurgical changes are noted with multiple tubes and drains in place. The overall appearance of the intra-abdominal fluid collections has improved particularly in the region of the perihepatic fluid collection and  subcapsular splenic fluid collection as well as the lesser sac fluid collection 08/02/16 extubated 08/06/16 Transfer to telemetry floor. [facility] to assume care beginning 03/06 (discussed with Dr Modena Nunnery). Infectious Disease consulted  08/07/16 CT Abdomen Pelvis: Overall response to therapy within the abdomen and pelvis. Decrease in size of multiple fluid collections as detailed above. Mildly worsened appearance of the left pleural space with increase in pleural fluid and thickening persistent loculated components, especially anteromedially. New left pleural air with interval removal of pleural drain. This area could relate to recent instrumentation. However, fistulous communication to the lungs cannot be excluded. Right base atelectasis. Right middle lobe and possible left lower lobe pneumonia. A right-sided pleural effusion is unchanged and more simple in appearance. New air within the endometrium is indeterminate. Correlate with symptoms to suggest endometritis. This could theoretically be postoperative in appearance (communication from the intraperitoneal space). 08/08/16 Pt taken back to OR by Dr. Genevive Bi for placement of right sided chest tube as well as a left sided thoracotomy for drainage of empyema (drained 600 ml purulent drainage). Due to wound sepsis and persistent purulent drainage Dr. Burt Knack performed rough debridement (findings were consistent with wound sepsis with necrotic fascia but no signs of evisceration cultures were obtained x2) and placed a wound vac to the pts midline abdominal wound while  the pt was in the OR.  Pt transferred back to ICU and PCCM contacted for assistance with management   OBJ: Vitals:   08/08/16 0500 08/08/16 0509 08/08/16 1208 08/08/16 1439  BP:  128/76 126/74 (!) 146/79  Pulse:  (!) 102 100 (!) 101  Resp:  18 18   Temp:  97.8 F (36.6 C) 97.7 F (36.5 C) 99.3 F (37.4 C)  TempSrc:   Oral Tympanic  SpO2:  98% 98% 97%  Weight: 59.7 kg (131 lb 9.6  oz)   59.4 kg (131 lb)  Height:    5\' 10"  (1.778 m)   [place] O2    BMP Latest Ref Rng & Units 08/08/2016 08/08/2016 08/07/2016  Glucose 65 - 99 mg/dL - 107(H) 116(H)  BUN 6 - 20 mg/dL - 9 11  Creatinine 0.44 - 1.00 mg/dL - <0.30(L) 0.34(L)  Sodium 135 - 145 mmol/L - 136 135  Potassium 3.5 - 5.1 mmol/L 2.9(L) 2.5(LL) 3.0(L)  Chloride 101 - 111 mmol/L - 104 105  CO2 22 - 32 mmol/L - 23 23  Calcium 8.9 - 10.3 mg/dL - 7.2(L) 7.2(L)   CBC Latest Ref Rng & Units 08/08/2016 08/07/2016 08/06/2016  WBC 3.6 - 11.0 K/uL 36.9(H) 35.6(H) 30.9(H)  Hemoglobin 12.0 - 16.0 g/dL 9.1(L) 7.2(L) 7.4(L)  Hematocrit 35.0 - 47.0 % 28.1(L) 22.8(L) 23.1(L)  Platelets 150 - 440 K/uL 691(H) 639(H) 557(H)   Results for orders placed or performed during the hospital encounter of 07/20/16  [place] rt PCR ([place] only)     Status: None   Collection Time: 07/20/16  1:15 PM  Result Value Ref Range Status   Specimen source GC/Chlam ENDOCERVICAL  Final   Chlamydia Tr NOT DETECTED NOT DETECTED Final   N gonorrhoeae NOT DETECTED NOT DETECTED Final    Comment: (NOTE) 100  This methodology has not been evaluated in pregnant women or in 200  patients with a history of hysterectomy. 300 400  This methodology will not be performed on patients less than 35  years of age.   Wet prep, genital     Status: Abnormal   Collection Time: 07/20/16  1:15 PM  Result Value Ref Range Status   Yeast Wet Prep HPF POC NONE SEEN NONE SEEN Final   Trich, Wet Prep NONE SEEN NONE SEEN Final   Clue Cells Wet Prep HPF POC NONE SEEN NONE SEEN Final   WBC, Wet Prep HPF POC RARE (A) NONE SEEN Final   Sperm NONE SEEN  Final  Blood culture (routine x 2)     Status: None   Collection Time: 07/20/16  3:07 PM  Result Value Ref Range Status   Specimen Description BLOOD left forearm  Final   Special Requests   Final    BOTTLES DRAWN AEROBIC AND ANAEROBIC AER12ML ANA12ML   Culture NO GROWTH 5 DAYS  Final   Report Status 07/25/2016 FINAL  Final  MRSA  PCR Screening     Status: None   Collection Time: 07/20/16  9:29 PM  Result Value Ref Range Status   MRSA by PCR NEGATIVE NEGATIVE Final    Comment:        The GeneXpert MRSA Assay (FDA approved for NASAL specimens only), is one component of a comprehensive MRSA colonization surveillance program. It is not intended to diagnose MRSA infection nor to guide or monitor treatment for MRSA infections.   Aerobic/Anaerobic Culture (surgical/deep wound)     Status: None   Collection Time: 07/25/16  3:00 PM  Result Value Ref Range Status   Specimen Description ABSCESS RIGHT ABDOMEN  Final   Special Requests NONE  Final   Gram Stain   Final    RARE WBC PRESENT, PREDOMINANTLY PMN NO ORGANISMS SEEN    Culture   Final    No growth aerobically or anaerobically. Performed at [facility], [place] [address] [place], [place] [address]    Report Status 07/31/2016 FINAL  Final  Body fluid culture     Status: None   Collection Time: 07/30/16 11:40 AM  Result Value Ref Range Status   Specimen Description PLEURAL  Final   Special Requests NONE  Final   Gram Stain   Final    MODERATE WBC PRESENT, PREDOMINANTLY PMN NO ORGANISMS SEEN    Culture   Final    No growth aerobically or anaerobically. Performed at [facility], [place] [address], [place], [place] [address]    Report Status 08/03/2016 FINAL  Final  Acid Fast Smear (AFB)     Status: None   Collection Time: 07/30/16 11:40 AM  Result Value Ref Range Status   AFB Specimen Processing Concentration  Final   Acid Fast Smear Negative  Final    Comment: (NOTE) Performed At: [facility] [place], [place] [id number] Lindon Romp MD GE:[id number]    Source (AFB) PLEURAL  Final  Aerobic Culture (superficial specimen)     Status: None (Preliminary result)   Collection Time: 08/06/16  3:34 PM  Result Value Ref Range Status   Specimen Description ABDOMEN  Final   Special Requests Normal  Final   Gram Stain    Final    RARE WBC PRESENT,BOTH PMN AND MONONUCLEAR NO ORGANISMS SEEN    Culture   Final    RARE GRAM NEGATIVE RODS IDENTIFICATION AND SUSCEPTIBILITIES TO FOLLOW Performed at [facility], [place] [address] [place],  [address]    Report Status PENDING  Incomplete   Anti-infectives    Start     Dose/Rate Route Frequency Ordered Stop   08/05/16 1500  vancomycin (VANCOCIN) IVPB 1000 mg/200 mL premix  Status:  Discontinued     1,000 mg 200 mL/hr over 60 Minutes Intravenous Every 12 hours 08/05/16 0753 08/06/16 0850   08/05/16 0800  vancomycin (VANCOCIN) IVPB 1000 mg/200 mL premix     1,000 mg 200 mL/hr over 60 Minutes Intravenous  Once 08/05/16 0753 08/05/16 0923   07/30/16 1815  [MAR Hold]  fluconazole (DIFLUCAN) IVPB 400 mg     (MAR Hold since 08/08/16 1430)   400 mg 100 mL/hr over 120 Minutes Intravenous Every 24 hours 07/30/16 1743     07/30/16 1528  piperacillin-tazobactam (ZOSYN) 3.375 GM/50ML IVPB    Comments:  LEWIS, CINDY: cabinet override      07/30/16 1528 07/30/16 1525   07/21/16 0000  [MAR Hold]  piperacillin-tazobactam (ZOSYN) IVPB 3.375 g     (MAR Hold since 08/08/16 1430)   3.375 g 12.5 mL/hr over 240 Minutes Intravenous Every 8 hours 07/20/16 1809     07/20/16 1430  piperacillin-tazobactam (ZOSYN) IVPB 3.375 g     3.375 g 100 mL/hr over 30 Minutes Intravenous  Once 07/20/16 1424 07/20/16 1603      CXR (03/04): edema vs ALI pattern  IMPRESSION: 1.Recurrent B/l Pleural effussion s/p insertion of right-sided chest tube and decortication of visceral and Parietal pleura left side 2) paroxysmal atrial fibrillation-resolved 3) hypokalemia 4) post laparotomy 2. Postoperative ileus 5) intraperitoneal abscesses  Due to perforated viscus 6 severe  woundsepsis, s/p Debridement and wound VAC placement, midline abdominal wound 7)  profound deconditioning  PLAN/REC:  Repeat CXR in AM 03/08  Monitor BMET Monitor I/Os Correct electrolytes as  indicated Monitor temp, WBC count Micro and abx as above Cont pip-tazo and fluconazole PT evaluation and treatment CT surgery /general surgery following   Bincy Varughese,AG-ACNP Pulmonary and [place]   08/08/2016, 9:37 PM   PCCM ATTENDING ATTESTATION:  I have evaluated patient with the APP Varughese, reviewed database in its entirety and discussed care plan in detail. In addition, this patient was discussed on multidisciplinary rounds.   This is a follow-up progress note. Patient has undergone left thoracotomy and repeat exploratory laparotomy. She had finding of a large amount of pus in her left pleural space.  Important exam findings:  Presently tachypneic but not overtly dyspneic appearing Chest without wheezes Cardiac exam: Tachycardia, regular, no murmurs Abdomen soft, surgical wound VAC in place Extremities warm without edema  Major problems addressed by PCCM team: Acute hypoxic respiratory failure Post op, left thoracotomy for empyema Status post right chest tube placement for benign pleural effusion Status post multiple exploratory laparotomies for perforated viscus and intra-abdominal abscesses Postop pain Protein-calorie malnutrition, on TPN Severe deconditioning   PLAN/REC: Monitor in ICU post op Continue empiric nebulized bronchodilators Antibiotics per ID service Chest tube management per thoracic surgery Abdominal management and surgery per general surgery Continue analgesia Monitor BMET intermittently Monitor I/Os Correct electrolytes as indicated Begin physical therapy    Merton Border, MD PCCM service Mobile [phone] Pager [phone]

## 2016-08-08 NOTE — OR Nursing (Signed)
Spoke with OR after they spoke with Dr. Marcello Moores (anesthesia) and since the K+ was still low, would hold on surgery for now and recheck potassium later.

## 2016-08-08 NOTE — Transfer of Care (Signed)
Immediate Anesthesia Transfer of Care Note  Patient: Lindsey Wolf  Procedure(s) Performed: Procedure(s): CHEST TUBE INSERTION (Right) VIDEO ASSISTED THORACOSCOPY (VATS)/THOROCOTOMY Possible Thoracotomy (Left) DEBRIDEMENT OF ABDOMINAL WALL ABSCESS (N/A)  Patient Location: PACU  Anesthesia Type:General  Level of Consciousness: awake, alert  and patient cooperative  Airway & Oxygen Therapy: Patient Spontanous Breathing and Patient connected to face mask oxygen  Post-op Assessment: Report given to RN and Post -op Vital signs reviewed and stable  Post vital signs: Reviewed and stable  Last Vitals:  Vitals:   08/08/16 1208 08/08/16 1439  BP: 126/74 (!) 146/79  Pulse: 100 (!) 101  Resp: 18   Temp: 36.5 C 37.4 C    Last Pain:  Vitals:   08/08/16 1439  TempSrc: Tympanic  PainSc: 7       Patients Stated Pain Goal: 0 (01/77/93 9030)  Complications: No apparent anesthesia complications

## 2016-08-08 NOTE — H&P (Signed)
I have seen Mrs. Lindsey Wolf today and again reviewed the indications risks of right chest tube insertion and left thoracoscopy possible thoracotomy with decortication of lung.  Her potassium has been low and has been replenished throughout the morning with a recent potassium of 2.9.  I agree with Dr. Burt Knack that it is necessary to proceed with the above procedure because of the need to drain/correct the infection in her left chest.  Dr. Burt Knack will also open her midline wound for drainage/debridement with possible wound vac coverage.  Lindsey Wolf is aware of the risks and wishes to proceed.  [employer].

## 2016-08-08 NOTE — Progress Notes (Signed)
Potassium drawn per anesthesia request and came back at 2.9. Second IV potassium replacement hung. OR notified, patient ready for transport.

## 2016-08-09 ENCOUNTER — Inpatient Hospital Stay: Payer: BLUE CROSS/BLUE SHIELD

## 2016-08-09 ENCOUNTER — Encounter: Payer: Self-pay | Admitting: Cardiothoracic Surgery

## 2016-08-09 LAB — BASIC METABOLIC PANEL
Anion gap: 5 (ref 5–15)
BUN: 14 mg/dL (ref 6–20)
CO2: 21 mmol/L — ABNORMAL LOW (ref 22–32)
Calcium: 7.6 mg/dL — ABNORMAL LOW (ref 8.9–10.3)
Chloride: 106 mmol/L (ref 101–111)
GLUCOSE: 179 mg/dL — AB (ref 65–99)
Potassium: 3.7 mmol/L (ref 3.5–5.1)
Sodium: 132 mmol/L — ABNORMAL LOW (ref 135–145)

## 2016-08-09 LAB — CBC WITH DIFFERENTIAL/PLATELET
BASOS PCT: 0 %
Basophils Absolute: 0.1 10*3/uL (ref 0–0.1)
EOS ABS: 0 10*3/uL (ref 0–0.7)
Eosinophils Relative: 0 %
HEMATOCRIT: 27.9 % — AB (ref 35.0–47.0)
HEMOGLOBIN: 8.8 g/dL — AB (ref 12.0–16.0)
Lymphocytes Relative: 2 %
Lymphs Abs: 0.8 10*3/uL — ABNORMAL LOW (ref 1.0–3.6)
MCH: 25.1 pg — ABNORMAL LOW (ref 26.0–34.0)
MCHC: 31.4 g/dL — ABNORMAL LOW (ref 32.0–36.0)
MCV: 79.8 fL — ABNORMAL LOW (ref 80.0–100.0)
MONOS PCT: 4 %
Monocytes Absolute: 1.6 10*3/uL — ABNORMAL HIGH (ref 0.2–0.9)
NEUTROS ABS: 37.3 10*3/uL — AB (ref 1.4–6.5)
NEUTROS PCT: 94 %
Platelets: 741 10*3/uL — ABNORMAL HIGH (ref 150–440)
RBC: 3.5 MIL/uL — AB (ref 3.80–5.20)
RDW: 24.7 % — ABNORMAL HIGH (ref 11.5–14.5)
WBC: 39.8 10*3/uL — AB (ref 3.6–11.0)

## 2016-08-09 LAB — MAGNESIUM: MAGNESIUM: 1.7 mg/dL (ref 1.7–2.4)

## 2016-08-09 LAB — GLUCOSE, CAPILLARY
GLUCOSE-CAPILLARY: 107 mg/dL — AB (ref 65–99)
GLUCOSE-CAPILLARY: 113 mg/dL — AB (ref 65–99)
GLUCOSE-CAPILLARY: 122 mg/dL — AB (ref 65–99)
GLUCOSE-CAPILLARY: 125 mg/dL — AB (ref 65–99)
GLUCOSE-CAPILLARY: 137 mg/dL — AB (ref 65–99)
Glucose-Capillary: 111 mg/dL — ABNORMAL HIGH (ref 65–99)

## 2016-08-09 LAB — AEROBIC CULTURE W GRAM STAIN (SUPERFICIAL SPECIMEN)

## 2016-08-09 LAB — AEROBIC CULTURE  (SUPERFICIAL SPECIMEN): SPECIAL REQUESTS: NORMAL

## 2016-08-09 MED ORDER — IPRATROPIUM-ALBUTEROL 0.5-2.5 (3) MG/3ML IN SOLN
3.0000 mL | RESPIRATORY_TRACT | Status: DC | PRN
  Administered 2016-08-09 – 2016-08-13 (×10): 3 mL via RESPIRATORY_TRACT
  Filled 2016-08-09 (×10): qty 3

## 2016-08-09 MED ORDER — MAGNESIUM SULFATE 2 GM/50ML IV SOLN
2.0000 g | Freq: Once | INTRAVENOUS | Status: AC
  Administered 2016-08-09: 2 g via INTRAVENOUS
  Filled 2016-08-09: qty 50

## 2016-08-09 MED ORDER — POTASSIUM CHLORIDE 2 MEQ/ML IV SOLN
30.0000 meq | INTRAVENOUS | Status: DC
  Administered 2016-08-09: 30 meq via INTRAVENOUS
  Filled 2016-08-09 (×2): qty 15

## 2016-08-09 MED ORDER — SODIUM CHLORIDE 0.9 % IV SOLN
30.0000 meq | Freq: Once | INTRAVENOUS | Status: DC
  Filled 2016-08-09: qty 15

## 2016-08-09 NOTE — Progress Notes (Signed)
Lindsey Wolf at [place] NAME: Lindsey Wolf    MR#:  [id number]  DATE OF BIRTH:  [birth year]  SUBJECTIVE:  Was anxious this am Wound vac and chest tubes in place  REVIEW OF SYSTEMS:    Review of Systems  Constitutional: Positive for malaise/fatigue. Negative for chills and fever.  HENT: Negative.  Negative for ear discharge, ear pain, hearing loss, nosebleeds and sore throat.   Eyes: Negative.  Negative for blurred vision and pain.  Respiratory: Negative.  Negative for cough, hemoptysis, shortness of breath and wheezing.   Cardiovascular: Negative.  Negative for chest pain, palpitations and leg swelling.  Gastrointestinal: Positive for abdominal pain. Negative for blood in stool, diarrhea, nausea and vomiting.  Genitourinary: Negative.  Negative for dysuria.  Musculoskeletal: Negative for back pain.  Skin: Negative.   Neurological: Positive for weakness. Negative for dizziness, tremors, speech change, focal weakness, seizures and headaches.  Endo/Heme/Allergies: Negative.  Does not bruise/bleed easily.  Psychiatric/Behavioral: Negative.  Negative for depression, hallucinations and suicidal ideas.     Tolerating Diet: TPN      DRUG ALLERGIES:  No Known Allergies  VITALS:  Blood pressure 107/60, pulse 94, temperature 98.1 F (36.7 C), temperature source Axillary, resp. rate (!) 40, height 5\' 10"  (1.778 m), weight 62.8 kg (138 lb 7.2 oz), SpO2 98 %.  PHYSICAL EXAMINATION:   Physical Exam  Constitutional: She is well-developed, well-nourished, and in no distress. No distress.  HENT:  Head: Normocephalic.  Eyes: No scleral icterus.  Neck: Normal range of motion. Neck supple. No JVD present. No tracheal deviation present.  Cardiovascular: Normal rate, regular rhythm and normal heart sounds.  Exam reveals no gallop and no friction rub.   No murmur heard. Pulmonary/Chest: Effort normal and breath sounds normal. No respiratory  distress. She has no wheezes. She has no rales. She exhibits no tenderness.  B/l chest tubes  Abdominal: She exhibits no distension and no mass. There is tenderness. There is no rebound and no guarding.  JP drains   Musculoskeletal: Normal range of motion. She exhibits no edema.  Neurological: She is alert.  Skin: Skin is warm. No rash noted. No erythema.  Psychiatric: Affect and judgment normal.      LABORATORY PANEL:   CBC  Recent Labs Lab 08/09/16 0511  WBC 39.8*  HGB 8.8*  HCT 27.9*  PLT 741*   ------------------------------------------------------------------------------------------------------------------  Chemistries   Recent Labs Lab 08/08/16 0607  08/09/16 0511  NA 136  < > 132*  K 2.5*  < > 3.7  CL 104  < > 106  CO2 23  < > 21*  GLUCOSE 107*  < > 179*  BUN 9  < > 14  CREATININE <0.30*  < > <0.30*  CALCIUM 7.2*  < > 7.6*  MG 1.7  --  1.7  AST 27  --   --   ALT 18  --   --   ALKPHOS 202*  --   --   BILITOT 0.5  --   --   < > = values in this interval not displayed. ------------------------------------------------------------------------------------------------------------------  Cardiac Enzymes No results for input(s): TROPONINI in the last 168 hours. ------------------------------------------------------------------------------------------------------------------  RADIOLOGY:  Ct Abdomen Pelvis W Contrast  Result Date: 08/07/2016 CLINICAL DATA:  Perforated peptic ulcer on 02/16. Peritonitis. Multiple abscesses. Right upper quadrant abscess drainage. Repeat exploratory laparotomy on 02/26. EXAM: CT ABDOMEN AND PELVIS WITH CONTRAST TECHNIQUE: Multidetector CT imaging of the abdomen and pelvis was performed  using the standard protocol following bolus administration of intravenous contrast. CONTRAST:  162mL ISOVUE-300 IOPAMIDOL (ISOVUE-300) INJECTION 61% COMPARISON:  08/02/2015 abdominopelvic CT. Chest radiograph of 08/08/2015. FINDINGS: Lower chest:  Dependent bibasilar opacities. Atelectasis on the right, similar. Slight increase in left base airspace disease. There is also a right middle lobe ground-glass opacity with mild septal thickening. Normal heart size. Small to moderate right pleural effusion is similar. Left-sided effusion is increased with areas of pleural air within. Left-sided pleural thickening and hyper enhancement are increased. Loculated component of the left-sided pleural effusion including antromedially, along the left heart border. 9.0 x 5.3 cm today versus 10.5 x 4.9 cm on the prior. Left pleural catheter no longer identified. Hepatobiliary: Left hepatic lobe cyst. Normal gallbladder, without biliary ductal dilatation. Pancreas: Normal, without mass or ductal dilatation. Tiny periampullary duodenal diverticulum. Spleen: Subcapsular perisplenic collection measures maximally 5.5 cm on image 24/series 2. Compare 6.1 cm on the prior. Adrenals/Urinary Tract: Mild left adrenal thickening. Normal right adrenal. Normal kidneys, without hydronephrosis. Foley catheter in the urinary bladder. Stomach/Bowel: Nasogastric terminates at the body of the stomach. Gastric underdistention. Apparent wall thickening is likely secondary. Sigmoid is also underdistended. Normal terminal ileum and appendix. Normal caliber of small bowel loops. Vascular/Lymphatic: Aortic and branch vessel atherosclerosis. No abdominopelvic adenopathy. Reproductive: New small ocular lobes of air within the uterus, including on image 80/series 2. Likely endometrial, when correlated with sagittal image 67. No adnexal mass. Other: Multiple drains are again identified. Felt to be similar in configuration to on the prior. Perihepatic loculated ascites with peritoneal thickening is slightly decreased, including along the right lobe of the liver on image 42/series 2. A right anterior abdominopelvic collection is nearly resolved, measuring 3.4 x 0.8 cm today on image 63/series 2. Compare 7.6  x 1.7 cm on the prior. A left pelvic sidewall collection measures 4.1 x 1.1 cm today versus 4.1 x 1.5 cm on the prior exam (when remeasured). No new collections are identified. Laparotomy staples. Anasarca. Fluid underneath the midline laparotomy staples with minimal gas. No well-defined collection. Musculoskeletal: No acute osseous abnormality. IMPRESSION: 1. Overall response to therapy within the abdomen and pelvis. Decrease in size of multiple fluid collections as detailed above. 2. Mildly worsened appearance of the left pleural space with increase in pleural fluid and thickening persistent loculated components, especially anteromedially. New left pleural air with interval removal of pleural drain. This area could relate to recent instrumentation. However, fistulous communication to the lungs cannot be excluded. 3. Right base atelectasis. Right middle lobe and possible left lower lobe pneumonia. A right-sided pleural effusion is unchanged and more simple in appearance. 4. New air within the endometrium is indeterminate. Correlate with symptoms to suggest endometritis. This could theoretically be postoperative in appearance (communication from the intraperitoneal space). Electronically Signed   By: Abigail Miyamoto M.D.   On: 08/07/2016 12:05   Dg Chest Port 1 View  Result Date: 08/09/2016 CLINICAL DATA:  51 year old female status post left-sided video assisted thoracoscopic surgery with decortication and bilateral chest tube placement EXAM: PORTABLE CHEST 1 VIEW COMPARISON:  Prior chest x-ray 08/08/2016 FINDINGS: Stable position of right upper extremity PICC. Catheter tip projects over the upper right atrium. Right-sided chest tumor main send unchanged position. Right subdiaphragmatic abdominal drainage catheter remains in unchanged position. Interval development of a small (5%) right-sided pneumothorax. There are 2 left-sided chest tubes which remain in unchanged position. Stable cardiac and mediastinal contours.  Slightly increased atelectasis in the left lower lobe. Slightly increased atelectasis in  the right lower lobe as well. Scattered patchy airspace opacities throughout both mid lungs. IMPRESSION: 1. Interval development of a small (5%) right apical pneumothorax. 2. The right-sided chest tube remains in unchanged position. 3. Lower inspiratory volumes with increased bibasilar atelectasis. 4. Patchy airspace opacities in both mid lungs which may represent multifocal pneumonia or areas of subsegmental atelectasis. 5. Other support apparatus in stable and unchanged position. These results will be called to the ordering clinician or representative by the Radiologist Assistant, and communication documented in the PACS or zVision Dashboard. Electronically Signed   By: Jacqulynn Cadet M.D.   On: 08/09/2016 08:39   Dg Chest Port 1 View  Result Date: 08/08/2016 CLINICAL DATA:  Thoracotomy, chest tube placement EXAM: PORTABLE CHEST 1 VIEW COMPARISON:  08/07/2016 and 0057 hours FINDINGS: Skin staples project over the periphery of the left hemithorax from recent thoracotomy with bilateral chest tubes in place, one on the right with side port projecting over the right cardiophrenic angle and what appear to be potentially three tubes on the left, two projecting up to the left lung apex and one along the periphery of the right lung base. A surgical drain also projects over the liver shadow berneath the right hemidiaphragm. Heart is borderline enlarged with mild interstitial edema. Right side PICC line tip in the proximal right atrium. Streaky atelectasis is noted at the left lung base and/or scarring with interval decrease in left-sided pleural fluid. No significant pneumothorax. Gastric tube has been removed. IMPRESSION: Status post left thoracotomy with bilateral chest tubes in place. Stable cardiomegaly with interval decrease in left-sided pleural fluid. No significant pneumothorax identified. Surgical drain projects over  right upper quadrant of the abdomen. Electronically Signed   By: Ashley Royalty M.D.   On: 08/08/2016 20:01     ASSESSMENT AND PLAN:    51 year old female admitted with the perforation peptic ulcer on fingers 16th and underwent emergent surgery with findings of purulent peritonitis who has had a complicated course.  1. Perforated peptic ulcer with complicated course since admission: POD #1 left thoracoscopy and thoracotomy for empyema  Continue Zosyn and fluconazole as requested by ID Follow up on final culture from drainage around the incision.  2. Hypokalemia:3.7 this am  3. Acute blood loss anemia: Patient transfused 3/6 Hgb up to 8.8 this am  4. Severe sepsis on admission: Resolved  5. PAF: Resolved  6. Hyponatremia: Following intake and output for need for  continousIVF  7. Anxiety; Ativan PRn   CODE STATUS: FULL  TOTAL TIME TAKING CARE OF THIS PATIENT: 24 minutes.    POSSIBLE D/C ??, DEPENDING ON CLINICAL CONDITION.   Taimane Stimmel M.D on 08/09/2016 at 11:23 AM  Between 7am to 6pm - Pager - [phone] After 6pm go to [email or web address] - password [email or web address] [facility]  Office  [phone]  CC: Primary care physician; No PCP Per Patient  Note: This dictation was prepared with Dragon dictation along with smaller phrase technology. Any transcriptional errors that result from this process are unintentional.

## 2016-08-09 NOTE — Progress Notes (Signed)
Report given to Apolonio Schneiders, RN who is now taking over patient's care.  Patient has been A&Ox4.  Pain controlled with PRN dilaudid.  Napped late this morning.  Continues to be tachypnic with shallow breathing but denies feeling short of breath.

## 2016-08-09 NOTE — Progress Notes (Signed)
Pt lying in bed resting quietly watching TV. has c/o pain and given pain meds as needed.. Pt output to Chest tubes/drains noted and charted. Will cont to monitor.

## 2016-08-09 NOTE — Progress Notes (Signed)
PHARMACY - ADULT TOTAL PARENTERAL NUTRITION CONSULT NOTE   Pharmacy Consult for TPN/ electrolytes Indication: Perforated ulcer  Patient Measurements: Height: 5\' 10"  (177.8 cm) Weight: 157 lb 13.6 oz (71.6 kg) IBW/kg (Calculated) : 68.5 TPN AdjBW (KG): 58.8 Body mass index is 22.65 kg/m.  CMP Latest Ref Rng & Units 08/09/2016 08/08/2016 08/08/2016  Glucose 65 - 99 mg/dL 179(H) 152(H) -  BUN 6 - 20 mg/dL 14 14 -  Creatinine 0.44 - 1.00 mg/dL <0.30(L) 0.30(L) -  Sodium 135 - 145 mmol/L 132(L) 135 -  Potassium 3.5 - 5.1 mmol/L 3.7 3.4(L) 2.9(L)  Chloride 101 - 111 mmol/L 106 108 -  CO2 22 - 32 mmol/L 21(L) 23 -  Calcium 8.9 - 10.3 mg/dL 7.6(L) 7.0(L) -  Total Protein 6.5 - 8.1 g/dL - - -  Total Bilirubin 0.3 - 1.2 mg/dL - - -  Alkaline Phos 38 - 126 U/L - - -  AST 15 - 41 U/L - - -  ALT 14 - 54 U/L - - -      Magnesium  Date Value Ref Range Status  08/09/2016 1.7 1.7 - 2.4 mg/dL Final   Phosphorus  Date Value Ref Range Status  08/08/2016 3.6 2.5 - 4.6 mg/dL Final    Assessment:Pharmacy consulted to assist in the management of electrolytes and glucose in this 51 year old female being starting on parenteral nutrition.   Plan: Continue Clinimix E 5/20to 83 ml/hrwith Trace elements and MVI along with lipids 20% @ 20 ml/hr for 12 hours daily per dietary's recommendations.   Potassium and magnesium replaced. Will f/u AM labs.     Has received 4 units SSI in 24 hours, continue Q4H accuchecks.  Ulice Dash D, PharmD Clinical Pharmacist 08/09/2016 1:11 PM

## 2016-08-09 NOTE — Progress Notes (Signed)
1 Day Post-Op  Subjective: Postop from left thoracoscopy and thoracotomy for empyema patient appears much better and feels better. Some problems breathing and is just had a breathing treatment  Objective: Vital signs in last 24 hours: Temp:  [97.3 F (36.3 C)-99.3 F (37.4 C)] 98.1 F (36.7 C) (03/08 0752) Pulse Rate:  [77-101] 98 (03/08 1000) Resp:  [18-37] 34 (03/08 1000) BP: (72-146)/(53-79) 119/66 (03/08 1000) SpO2:  [96 %-99 %] 96 % (03/08 1000) Weight:  [131 lb (59.4 kg)-138 lb 7.2 oz (62.8 kg)] 138 lb 7.2 oz (62.8 kg) (03/08 0500) Last BM Date: 08/07/16  Intake/Output from previous day: 03/07 0701 - 03/08 0700 In: 3138.9 [I.V.:2823.9; IV Piggyback:315] Out: 5027 [Urine:2475; Drains:8; Blood:100; Chest Tube:1080] Intake/Output this shift: Total I/O In: 564 [I.V.:249; IV Piggyback:315] Out: 250 [Urine:250]  Physical exam:  Vital signs reviewed. Some wheezes especially in the left chest chest tubes are functional Wound VAC in place with clean non-erythematous wound edges Nontender calves  Lab Results: CBC   Recent Labs  08/08/16 2156 08/09/16 0511  WBC 48.2* 39.8*  HGB 8.8* 8.8*  HCT 28.4* 27.9*  PLT 827* 741*   BMET  Recent Labs  08/08/16 2156 08/09/16 0511  NA 135 132*  K 3.4* 3.7  CL 108 106  CO2 23 21*  GLUCOSE 152* 179*  BUN 14 14  CREATININE 0.30* <0.30*  CALCIUM 7.0* 7.6*   PT/INR No results for input(s): LABPROT, INR in the last 72 hours. ABG No results for input(s): PHART, HCO3 in the last 72 hours.  Invalid input(s): PCO2, PO2  Studies/Results: Ct Abdomen Pelvis W Contrast  Result Date: 08/07/2016 CLINICAL DATA:  Perforated peptic ulcer on 02/16. Peritonitis. Multiple abscesses. Right upper quadrant abscess drainage. Repeat exploratory laparotomy on 02/26. EXAM: CT ABDOMEN AND PELVIS WITH CONTRAST TECHNIQUE: Multidetector CT imaging of the abdomen and pelvis was performed using the standard protocol following bolus administration of  intravenous contrast. CONTRAST:  178mL ISOVUE-300 IOPAMIDOL (ISOVUE-300) INJECTION 61% COMPARISON:  08/02/2015 abdominopelvic CT. Chest radiograph of 08/08/2015. FINDINGS: Lower chest: Dependent bibasilar opacities. Atelectasis on the right, similar. Slight increase in left base airspace disease. There is also a right middle lobe ground-glass opacity with mild septal thickening. Normal heart size. Small to moderate right pleural effusion is similar. Left-sided effusion is increased with areas of pleural air within. Left-sided pleural thickening and hyper enhancement are increased. Loculated component of the left-sided pleural effusion including antromedially, along the left heart border. 9.0 x 5.3 cm today versus 10.5 x 4.9 cm on the prior. Left pleural catheter no longer identified. Hepatobiliary: Left hepatic lobe cyst. Normal gallbladder, without biliary ductal dilatation. Pancreas: Normal, without mass or ductal dilatation. Tiny periampullary duodenal diverticulum. Spleen: Subcapsular perisplenic collection measures maximally 5.5 cm on image 24/series 2. Compare 6.1 cm on the prior. Adrenals/Urinary Tract: Mild left adrenal thickening. Normal right adrenal. Normal kidneys, without hydronephrosis. Foley catheter in the urinary bladder. Stomach/Bowel: Nasogastric terminates at the body of the stomach. Gastric underdistention. Apparent wall thickening is likely secondary. Sigmoid is also underdistended. Normal terminal ileum and appendix. Normal caliber of small bowel loops. Vascular/Lymphatic: Aortic and branch vessel atherosclerosis. No abdominopelvic adenopathy. Reproductive: New small ocular lobes of air within the uterus, including on image 80/series 2. Likely endometrial, when correlated with sagittal image 67. No adnexal mass. Other: Multiple drains are again identified. Felt to be similar in configuration to on the prior. Perihepatic loculated ascites with peritoneal thickening is slightly decreased,  including along the right lobe of the  liver on image 42/series 2. A right anterior abdominopelvic collection is nearly resolved, measuring 3.4 x 0.8 cm today on image 63/series 2. Compare 7.6 x 1.7 cm on the prior. A left pelvic sidewall collection measures 4.1 x 1.1 cm today versus 4.1 x 1.5 cm on the prior exam (when remeasured). No new collections are identified. Laparotomy staples. Anasarca. Fluid underneath the midline laparotomy staples with minimal gas. No well-defined collection. Musculoskeletal: No acute osseous abnormality. IMPRESSION: 1. Overall response to therapy within the abdomen and pelvis. Decrease in size of multiple fluid collections as detailed above. 2. Mildly worsened appearance of the left pleural space with increase in pleural fluid and thickening persistent loculated components, especially anteromedially. New left pleural air with interval removal of pleural drain. This area could relate to recent instrumentation. However, fistulous communication to the lungs cannot be excluded. 3. Right base atelectasis. Right middle lobe and possible left lower lobe pneumonia. A right-sided pleural effusion is unchanged and more simple in appearance. 4. New air within the endometrium is indeterminate. Correlate with symptoms to suggest endometritis. This could theoretically be postoperative in appearance (communication from the intraperitoneal space). Electronically Signed   By: Abigail Miyamoto M.D.   On: 08/07/2016 12:05   Dg Chest Port 1 View  Result Date: 08/09/2016 CLINICAL DATA:  50 year old female status post left-sided video assisted thoracoscopic surgery with decortication and bilateral chest tube placement EXAM: PORTABLE CHEST 1 VIEW COMPARISON:  Prior chest x-ray 08/08/2016 FINDINGS: Stable position of right upper extremity PICC. Catheter tip projects over the upper right atrium. Right-sided chest tumor main send unchanged position. Right subdiaphragmatic abdominal drainage catheter remains in  unchanged position. Interval development of a small (5%) right-sided pneumothorax. There are 2 left-sided chest tubes which remain in unchanged position. Stable cardiac and mediastinal contours. Slightly increased atelectasis in the left lower lobe. Slightly increased atelectasis in the right lower lobe as well. Scattered patchy airspace opacities throughout both mid lungs. IMPRESSION: 1. Interval development of a small (5%) right apical pneumothorax. 2. The right-sided chest tube remains in unchanged position. 3. Lower inspiratory volumes with increased bibasilar atelectasis. 4. Patchy airspace opacities in both mid lungs which may represent multifocal pneumonia or areas of subsegmental atelectasis. 5. Other support apparatus in stable and unchanged position. These results will be called to the ordering clinician or representative by the Radiologist Assistant, and communication documented in the PACS or zVision Dashboard. Electronically Signed   By: Jacqulynn Cadet M.D.   On: 08/09/2016 08:39   Dg Chest Port 1 View  Result Date: 08/08/2016 CLINICAL DATA:  Thoracotomy, chest tube placement EXAM: PORTABLE CHEST 1 VIEW COMPARISON:  08/07/2016 and 0057 hours FINDINGS: Skin staples project over the periphery of the left hemithorax from recent thoracotomy with bilateral chest tubes in place, one on the right with side port projecting over the right cardiophrenic angle and what appear to be potentially three tubes on the left, two projecting up to the left lung apex and one along the periphery of the right lung base. A surgical drain also projects over the liver shadow berneath the right hemidiaphragm. Heart is borderline enlarged with mild interstitial edema. Right side PICC line tip in the proximal right atrium. Streaky atelectasis is noted at the left lung base and/or scarring with interval decrease in left-sided pleural fluid. No significant pneumothorax. Gastric tube has been removed. IMPRESSION: Status post  left thoracotomy with bilateral chest tubes in place. Stable cardiomegaly with interval decrease in left-sided pleural fluid. No significant pneumothorax identified.  Surgical drain projects over right upper quadrant of the abdomen. Electronically Signed   By: Ashley Royalty M.D.   On: 08/08/2016 20:01    Anti-infectives: Anti-infectives    Start     Dose/Rate Route Frequency Ordered Stop   08/05/16 1500  vancomycin (VANCOCIN) IVPB 1000 mg/200 mL premix  Status:  Discontinued     1,000 mg 200 mL/hr over 60 Minutes Intravenous Every 12 hours 08/05/16 0753 08/06/16 0850   08/05/16 0800  vancomycin (VANCOCIN) IVPB 1000 mg/200 mL premix     1,000 mg 200 mL/hr over 60 Minutes Intravenous  Once 08/05/16 0753 08/05/16 0923   07/30/16 1815  fluconazole (DIFLUCAN) IVPB 400 mg     400 mg 100 mL/hr over 120 Minutes Intravenous Every 24 hours 07/30/16 1743     07/30/16 1528  piperacillin-tazobactam (ZOSYN) 3.375 GM/50ML IVPB    Comments:  LEWIS, CINDY: cabinet override      07/30/16 1528 07/30/16 1525   07/21/16 0000  piperacillin-tazobactam (ZOSYN) IVPB 3.375 g     3.375 g 12.5 mL/hr over 240 Minutes Intravenous Every 8 hours 07/20/16 1809     07/20/16 1430  piperacillin-tazobactam (ZOSYN) IVPB 3.375 g     3.375 g 100 mL/hr over 30 Minutes Intravenous  Once 07/20/16 1424 07/20/16 1603      Assessment/Plan: s/p Procedure(s): CHEST TUBE INSERTION VIDEO ASSISTED THORACOSCOPY (VATS)/THOROCOTOMY Possible Thoracotomy DEBRIDEMENT OF ABDOMINAL WALL ABSCESS   Patient looks much better than she did yesterday she is mentating well and her physical exam is much improved. Continue current care including TPN but I will start clear liquids today.  Florene Glen, MD, FACS  08/09/2016

## 2016-08-09 NOTE — Progress Notes (Signed)
RN spoke with Dr. Genevive Bi and made MD aware that 5% pneumothorax was noted on chest xray.  MD acknowledged and stated that he had already seen the xray.  No orders given.

## 2016-08-09 NOTE — Evaluation (Signed)
Physical Therapy RE-Evaluation Patient Details Name: Lindsey Wolf MRN: [id number] DOB: 1965/10/25 Today's Date: 08/09/2016   History of Present Illness  Pt is a 51 year old female status post complicated repair of peptic ulcer disease. She later underwent a repeat laparotomy for drainage of intra-abdominal abscesses. Pt diagnosed with Acute hypoxic respiratory failure with L chest tube placement, septic shock with hypotension, and A-fib with RVR.  Pt also has multiple JP drains and was intubated but is now extubated. Pt underwent L thoracotomy and thoracoscopy along with debridement of abdominal wall abscess on 08/08/16.  Pt now with B chest tubes, JP drains, and wound vac. Of note, small pneumothorax per chart review. Received continue upon transfer orders this date and RN cleared for particiaption with therapy.  Clinical Impression  Pt is a pleasant 51 year old female who was admitted for complicated repair of peptic ulcer disease. Hospital stay has been complicated with respiratory failure including intubation and now recent surgeries on 3/7.  Pt A&O x 4. Re-evaluation performed this date. Pt extremely weak and becomes SOB with any exertion increasing RR to 40-44. Unable to perform OOB mobility this date. Will plan to perform next date if pt able to tolerate. Multiple lines/leads tube present. Pt demonstrates deficits with endurance/strength/mobility. Would benefit from skilled PT to address above deficits and promote optimal return to PLOF; continue to recommend transition to STR upon discharge from acute hospitalization.       Follow Up Recommendations SNF    Equipment Recommendations  None recommended by PT    Recommendations for Other Services       Precautions / Restrictions Precautions Precautions: Fall Restrictions Weight Bearing Restrictions: No      Mobility  Bed Mobility               General bed mobility comments: not appropriate this date secondary to medical  issues and respiratory status  Transfers                    Ambulation/Gait                Stairs            Wheelchair Mobility    Modified Rankin (Stroke Patients Only)       Balance                                             Pertinent Vitals/Pain Pain Assessment: 0-10 Pain Score: 8  Pain Location: abdomen Pain Descriptors / Indicators: Operative site guarding Pain Intervention(s): RN gave pain meds during session    Home Living Family/patient expects to be discharged to:: Private residence Living Arrangements: Parent Available Help at Discharge: Family (pt lives with mother, and was her caregiver, ) Type of Home: Mobile home Home Access: Ramped entrance     Home Layout: One level Home Equipment: Environmental consultant - 2 wheels;Walker - 4 wheels;Cane - quad;Wheelchair - manual      Prior Function Level of Independence: Independent         Comments: Pt works FT, Ind with amb without AD, Ind with ADLs, 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     Hand Dominance        Extremity/Trunk Assessment   Upper Extremity Assessment Upper Extremity Assessment: Generalized weakness (B UE grossly 3+/5)    Lower Extremity Assessment Lower Extremity Assessment: Generalized weakness (B LE grossly 3/5)  Communication   Communication: No difficulties  Cognition Arousal/Alertness: Awake/alert Behavior During Therapy: WFL for tasks assessed/performed Overall Cognitive Status: Within Functional Limits for tasks assessed                      General Comments      Exercises Other Exercises Other Exercises: Supine ther-ex performed on B LE including ankle pumps, quad sets, hip abd/add, SLRs, and heel slides. All ther-ex performed x 10 reps with min assist. No increased pain reported with ther-ex. Needed several breaks secondary to respirations increasing to 44. All ther-ex performed on 2L of O2. Sats WNL   Assessment/Plan    PT Assessment  Patient needs continued PT services  PT Problem List Decreased strength;Decreased range of motion;Decreased activity tolerance;Decreased mobility       PT Treatment Interventions Gait training;Therapeutic activities;Therapeutic exercise    PT Goals (Current goals can be found in the Care Plan section)  Acute Rehab PT Goals Patient Stated Goal: to get stronger PT Goal Formulation: With patient Time For Goal Achievement: 08/23/16 Potential to Achieve Goals: Good    Frequency Min 2X/week   Barriers to discharge        Co-evaluation               End of Session Equipment Utilized During Treatment: Oxygen Activity Tolerance: Patient tolerated treatment well Patient left: in bed;with call bell/phone within reach;with family/visitor present Nurse Communication: Mobility status PT Visit Diagnosis: Muscle weakness (generalized) (M62.81);Difficulty in walking, not elsewhere classified (R26.2)         Time: 4944-9675 PT Time Calculation (min) (ACUTE ONLY): 16 min   Charges:   PT Evaluation $PT Re-evaluation: 1 Procedure     PT G Codes:         Zebedee Segundo August 10, 2016, 2:14 PM Greggory Stallion, PT, DPT [phone]

## 2016-08-09 NOTE — Addendum Note (Signed)
Addendum  created 08/09/16 8473 by Aline Brochure, CRNA   Sign clinical note

## 2016-08-09 NOTE — Progress Notes (Signed)
Dr. Burt Knack present at bedside and gave order for clear liquid diet, no carbonation.

## 2016-08-09 NOTE — Progress Notes (Signed)
She looked fairly well this morning. She did complain of some pain on both her right and left sides. She stated that she was hungry and although she was somewhat short of breath she thought this was mostly related to anxiety.  All of her wounds were examined. The midline abdominal incision has an intact wound VAC present. It is draining some serosanguineous purulent fluid. The right and left chest tubes were also examined. There is no air leak on either side. There is some serosanguineous drainage on the right chest tube incision dressing.  Her chest x-ray this morning shows a small right apical pneumothorax but otherwise looks quite well overall.  I did discuss her care with Dr. Burt Knack. I did believe that she could be managed on the floor. She will also be permitted a liquid diet today. We will continue to assess her wounds daily as well as her chest tube drainage.

## 2016-08-09 NOTE — Progress Notes (Signed)
RN spoke with Dr. Benjie Karvonen on the phone and made MD aware that patient is complaining of shortness of breath and does have wheezes but that duoneb can't be given at this time per PRN order for q6H and patient asking for breathing treatment.  Patient also asking for something for anxiety and stated that ativan does not work at the 1 mg dose that is ordered and that she cant pee when she gets the ativan.  Dr. Benjie Karvonen gave order to change nebs to q4H.  MD stated "i dont want to give her more ativan because she is a small lady and I cant give her anything by mouth at this time." no further orders at this time.

## 2016-08-09 NOTE — Progress Notes (Signed)
Nutrition Follow-up  DOCUMENTATION CODES:   Not applicable  INTERVENTION:  -Continue TPN at goal rate (5%AA/20%Dextrose at rate of 83 ml/hr with 20% ILE at rate of 20 ml/hr for 12 hours) -If tolerating po, recommend addition of nutritional supplement. Continue to assess   NUTRITION DIAGNOSIS:   Inadequate oral intake related to inability to eat as evidenced by NPO status.  Continues but being addressed via TPN  GOAL:   Patient will meet greater than or equal to 90% of their needs  Met  MONITOR:   I & O's, Diet advancement, Labs, Weight trends, Supplement acceptance  REASON FOR ASSESSMENT:   Malnutrition Screening Tool    ASSESSMENT:    3/7 OR penrose in midline abdomen, debridment of wound with wound vac placement, pre-op bronch with insertion of chest tube and decortication of visceral parietal pleura . Pt has 4 chest tubes and 3 JP drains with wound vac Pt on 2L [place], pt did receive 2L fluid yesterday, weight up TPN 5%AA/20%Dextrose infusing at rate of 83 ml/hr, 20% ILE at rate of 20 ml/hr for 12 hours Diet advanced to CL this AM Labs: sodium 132 Meds: reviewed  Diet Order:  TPN (CLINIMIX-E) Adult TPN (CLINIMIX-E) Adult Diet clear liquid Room service appropriate? Yes; Fluid consistency: Thin  Skin:  Wound (see comment) (stage II pressure ulcer in nare)  Last BM:  08/07/16  Height:   Ht Readings from Last 1 Encounters:  08/08/16 '5\' 10"'  (1.778 m)    Weight:   Wt Readings from Last 1 Encounters:  08/09/16 138 lb 7.2 oz (62.8 kg)   Filed Weights   08/08/16 0500 08/08/16 1439 08/09/16 0500  Weight: 131 lb 9.6 oz (59.7 kg) 131 lb (59.4 kg) 138 lb 7.2 oz (62.8 kg)    Ideal Body Weight:  68.18 kg  BMI:  Body mass index is 19.87 kg/m.  Estimated Nutritional Needs:   Kcal:  2050-2400 kcals  Protein:  102-136 g  Fluid:  >/= 2 L  EDUCATION NEEDS:   No education needs identified at this time  [place], [place], LDN [phone] Pager  [phone] Weekend/On-Call Pager

## 2016-08-09 NOTE — Anesthesia Postprocedure Evaluation (Signed)
Anesthesia Post Note  Patient: Lindsey Wolf  Procedure(s) Performed: Procedure(s) (LRB): CHEST TUBE INSERTION (Right) VIDEO ASSISTED THORACOSCOPY (VATS)/THOROCOTOMY Possible Thoracotomy (Left) DEBRIDEMENT OF ABDOMINAL WALL ABSCESS (N/A)  Patient location during evaluation: ICU Anesthesia Type: General Level of consciousness: awake and alert and oriented Pain management: pain level controlled Vital Signs Assessment: post-procedure vital signs reviewed and stable Respiratory status: spontaneous breathing and patient connected to nasal cannula oxygen Cardiovascular status: blood pressure returned to baseline and stable Postop Assessment: no signs of nausea or vomiting Anesthetic complications: no     Last Vitals:  Vitals:   08/09/16 0000 08/09/16 0400  BP:    Pulse:    Resp:    Temp: 37.2 C 37 C    Last Pain:  Vitals:   08/09/16 0400  TempSrc: Axillary  PainSc:                  Estill Batten

## 2016-08-09 NOTE — Progress Notes (Signed)
PHARMACY - ADULT TOTAL PARENTERAL NUTRITION CONSULT NOTE   Pharmacy Consult for TPN/ electrolytes Indication: Perforated ulcer  Patient Measurements: Height: 5\' 10"  (177.8 cm) Weight: 157 lb 13.6 oz (71.6 kg) IBW/kg (Calculated) : 68.5 TPN AdjBW (KG): 58.8 Body mass index is 22.65 kg/m.  CMP Latest Ref Rng & Units 08/08/2016 08/08/2016 08/08/2016  Glucose 65 - 99 mg/dL 152(H) - 107(H)  BUN 6 - 20 mg/dL 14 - 9  Creatinine 0.44 - 1.00 mg/dL 0.30(L) - <0.30(L)  Sodium 135 - 145 mmol/L 135 - 136  Potassium 3.5 - 5.1 mmol/L 3.4(L) 2.9(L) 2.5(LL)  Chloride 101 - 111 mmol/L 108 - 104  CO2 22 - 32 mmol/L 23 - 23  Calcium 8.9 - 10.3 mg/dL 7.0(L) - 7.2(L)  Total Protein 6.5 - 8.1 g/dL - - 5.4(L)  Total Bilirubin 0.3 - 1.2 mg/dL - - 0.5  Alkaline Phos 38 - 126 U/L - - 202(H)  AST 15 - 41 U/L - - 27  ALT 14 - 54 U/L - - 18      Magnesium  Date Value Ref Range Status  08/08/2016 1.7 1.7 - 2.4 mg/dL Final   Phosphorus  Date Value Ref Range Status  08/08/2016 3.6 2.5 - 4.6 mg/dL Final    Assessment:Pharmacy consulted to assist in the management of electrolytes and glucose in this 51 year old female being starting on parenteral nutrition. Patient extubated on 3/1. Transferred out of ICU on 3/5.     Plan: Continue Clinimix E 5/20to 83 ml/hrwith Trace elements and MVI along with lipids 20% @ 20 ml/hr for 12 hours daily per dietary's recommendations.   Potassium replaced with KCl 30 meq IV x 2.  Will f/u at 1800 and replace as needed.     Has not received any SSI in 24 hours, continue Q4H accuchecks.  3/7 PM K+ 3.4. 30 mEq KCl IV ordered by MD. Recheck BMP in AM.  Eloise Harman, PharmD, BCPS Clinical Pharmacist 08/09/2016 1:26 AM

## 2016-08-10 ENCOUNTER — Inpatient Hospital Stay: Payer: BLUE CROSS/BLUE SHIELD

## 2016-08-10 LAB — MAGNESIUM: MAGNESIUM: 1.8 mg/dL (ref 1.7–2.4)

## 2016-08-10 LAB — TYPE AND SCREEN
ABO/RH(D): A POS
Antibody Screen: NEGATIVE
UNIT DIVISION: 0
Unit division: 0
Unit division: 0

## 2016-08-10 LAB — GLUCOSE, CAPILLARY
GLUCOSE-CAPILLARY: 79 mg/dL (ref 65–99)
Glucose-Capillary: 104 mg/dL — ABNORMAL HIGH (ref 65–99)
Glucose-Capillary: 106 mg/dL — ABNORMAL HIGH (ref 65–99)
Glucose-Capillary: 117 mg/dL — ABNORMAL HIGH (ref 65–99)
Glucose-Capillary: 94 mg/dL (ref 65–99)
Glucose-Capillary: 96 mg/dL (ref 65–99)

## 2016-08-10 LAB — BPAM RBC
BLOOD PRODUCT EXPIRATION DATE: 201803192359
BLOOD PRODUCT EXPIRATION DATE: 201803262359
Blood Product Expiration Date: 201803262359
ISSUE DATE / TIME: 201803061815
UNIT TYPE AND RH: 600
UNIT TYPE AND RH: 6200
Unit Type and Rh: 6200

## 2016-08-10 LAB — BASIC METABOLIC PANEL
Anion gap: 6 (ref 5–15)
BUN: 15 mg/dL (ref 6–20)
CALCIUM: 7.3 mg/dL — AB (ref 8.9–10.3)
CO2: 24 mmol/L (ref 22–32)
Chloride: 103 mmol/L (ref 101–111)
Creatinine, Ser: <0.3 mg/dL — ABNORMAL LOW (ref 0.44–1.00)
Glucose, Bld: 117 mg/dL — ABNORMAL HIGH (ref 65–99)
Potassium: 3.5 mmol/L (ref 3.5–5.1)
Sodium: 133 mmol/L — ABNORMAL LOW (ref 135–145)

## 2016-08-10 LAB — CBC
HCT: 25.9 % — ABNORMAL LOW (ref 35.0–47.0)
Hemoglobin: 8.2 g/dL — ABNORMAL LOW (ref 12.0–16.0)
MCH: 27.1 pg (ref 26.0–34.0)
MCHC: 31.8 g/dL — AB (ref 32.0–36.0)
MCV: 85.1 fL (ref 80.0–100.0)
PLATELETS: 718 10*3/uL — AB (ref 150–440)
RBC: 3.04 MIL/uL — ABNORMAL LOW (ref 3.80–5.20)
RDW: 26 % — AB (ref 11.5–14.5)
WBC: 26.5 10*3/uL — ABNORMAL HIGH (ref 3.6–11.0)

## 2016-08-10 LAB — PREPARE RBC (CROSSMATCH)

## 2016-08-10 LAB — SURGICAL PATHOLOGY

## 2016-08-10 LAB — PHOSPHORUS: PHOSPHORUS: 3.8 mg/dL (ref 2.5–4.6)

## 2016-08-10 MED ORDER — TRACE MINERALS CR-CU-MN-SE-ZN 10-1000-500-60 MCG/ML IV SOLN
INTRAVENOUS | Status: AC
  Administered 2016-08-10: 19:00:00 via INTRAVENOUS
  Filled 2016-08-10: qty 960

## 2016-08-10 MED ORDER — FAT EMULSION 20 % IV EMUL
250.0000 mL | INTRAVENOUS | Status: AC
  Administered 2016-08-10: 250 mL via INTRAVENOUS
  Filled 2016-08-10: qty 250

## 2016-08-10 MED ORDER — HYDROMORPHONE HCL 1 MG/ML IJ SOLN
0.5000 mg | Freq: Once | INTRAMUSCULAR | Status: AC
  Administered 2016-08-10: 0.5 mg via INTRAVENOUS
  Filled 2016-08-10: qty 0.5

## 2016-08-10 MED ORDER — ENSURE ENLIVE PO LIQD
237.0000 mL | Freq: Three times a day (TID) | ORAL | Status: DC
Start: 2016-08-10 — End: 2016-08-25
  Administered 2016-08-10 – 2016-08-24 (×17): 237 mL via ORAL

## 2016-08-10 MED ORDER — MAGNESIUM SULFATE 2 GM/50ML IV SOLN
2.0000 g | Freq: Once | INTRAVENOUS | Status: AC
  Administered 2016-08-10: 2 g via INTRAVENOUS
  Filled 2016-08-10: qty 50

## 2016-08-10 MED ORDER — SODIUM CHLORIDE 0.9 % IV SOLN
30.0000 meq | Freq: Once | INTRAVENOUS | Status: AC
  Administered 2016-08-10: 30 meq via INTRAVENOUS
  Filled 2016-08-10: qty 15

## 2016-08-10 MED ORDER — STERILE WATER FOR INJECTION IJ SOLN
INTRAMUSCULAR | Status: AC
  Administered 2016-08-10: 10 mL
  Filled 2016-08-10: qty 10

## 2016-08-10 MED ORDER — LORAZEPAM 2 MG/ML IJ SOLN
0.5000 mg | INTRAMUSCULAR | Status: DC | PRN
  Administered 2016-08-10 – 2016-08-11 (×2): 1 mg via INTRAVENOUS
  Administered 2016-08-11: 0.5 mg via INTRAVENOUS
  Administered 2016-08-11 – 2016-08-13 (×4): 1 mg via INTRAVENOUS
  Administered 2016-08-14 – 2016-08-16 (×4): 0.5 mg via INTRAVENOUS
  Administered 2016-08-16 – 2016-08-19 (×5): 1 mg via INTRAVENOUS
  Administered 2016-08-20 – 2016-08-21 (×2): 0.5 mg via INTRAVENOUS
  Administered 2016-08-22 – 2016-08-28 (×14): 1 mg via INTRAVENOUS
  Filled 2016-08-10 (×32): qty 1

## 2016-08-10 NOTE — Progress Notes (Signed)
Awake and alert today.  Some pain.  Did get out of bed to chair.  Wounds are clean and dry. Lungs distant with scattered rhonchi Heart tachy  No air leak seen on either side Drainage from both chest tubes is serous to serosanguinous.    CXRay from today is not much different than yesterday with small right apical pneumothorax.  Will leave chest tubes on suction when she is in room.  Chest tubes may be placed to water seal for ambulation in halls.  Tim [employer]

## 2016-08-10 NOTE — Progress Notes (Signed)
Foley removed per MD order. External catheter placed for patient comfort. Will continue to assess.

## 2016-08-10 NOTE — Progress Notes (Signed)
PHARMACY - ADULT TOTAL PARENTERAL NUTRITION CONSULT NOTE   Pharmacy Consult for TPN/ electrolytes Indication: Perforated ulcer  Patient Measurements: Height: 5\' 10"  (177.8 cm) Weight: 157 lb 13.6 oz (71.6 kg) IBW/kg (Calculated) : 68.5 TPN AdjBW (KG): 58.8 Body mass index is 22.65 kg/m.  CMP Latest Ref Rng & Units 08/10/2016 08/09/2016 08/08/2016  Glucose 65 - 99 mg/dL 117(H) 179(H) 152(H)  BUN 6 - 20 mg/dL 15 14 14   Creatinine 0.44 - 1.00 mg/dL <0.30(L) <0.30(L) 0.30(L)  Sodium 135 - 145 mmol/L 133(L) 132(L) 135  Potassium 3.5 - 5.1 mmol/L 3.5 3.7 3.4(L)  Chloride 101 - 111 mmol/L 103 106 108  CO2 22 - 32 mmol/L 24 21(L) 23  Calcium 8.9 - 10.3 mg/dL 7.3(L) 7.6(L) 7.0(L)  Total Protein 6.5 - 8.1 g/dL - - -  Total Bilirubin 0.3 - 1.2 mg/dL - - -  Alkaline Phos 38 - 126 U/L - - -  AST 15 - 41 U/L - - -  ALT 14 - 54 U/L - - -      Magnesium  Date Value Ref Range Status  08/10/2016 1.8 1.7 - 2.4 mg/dL Final   Phosphorus  Date Value Ref Range Status  08/10/2016 3.8 2.5 - 4.6 mg/dL Final    Assessment:Pharmacy consulted to assist in the management of electrolytes and glucose in this 51 year old female being starting on parenteral nutrition.   Plan: Clinimix E 5/20rate reduced (in anticipation of TPN DC tomorrow) to 40 ml/hrwith Trace elements and MVI along with lipids 20% @ 20 ml/hr for 12 hours daily per dietary's recommendations.   Potassium and magnesium replaced. Will f/u AM labs.     Has received 0 units SSI in 24 hours, continue Q4H accuchecks.  Ulice Dash D, PharmD Clinical Pharmacist 08/10/2016 3:05 PM

## 2016-08-10 NOTE — Progress Notes (Signed)
[name] NAME: Lindsey Wolf    MR#:  [id number]  DATE OF BIRTH:  29-Nov-1965  SUBJECTIVE:  Was anxious this am Wound vac and chest tubes in place  REVIEW OF SYSTEMS:    Review of Systems  Constitutional: Positive for malaise/fatigue. Negative for chills and fever.  HENT: Negative.  Negative for ear discharge, ear pain, hearing loss, nosebleeds and sore throat.   Eyes: Negative.  Negative for blurred vision and pain.  Respiratory: Negative.  Negative for cough, hemoptysis, shortness of breath and wheezing.   Cardiovascular: Negative.  Negative for chest pain, palpitations and leg swelling.  Gastrointestinal: Positive for abdominal pain. Negative for blood in stool, diarrhea, nausea and vomiting.  Genitourinary: Negative.  Negative for dysuria.  Musculoskeletal: Negative for back pain.  Skin: Negative.   Neurological: Positive for weakness. Negative for dizziness, tremors, speech change, focal weakness, seizures and headaches.  Endo/Heme/Allergies: Negative.  Does not bruise/bleed easily.  Psychiatric/Behavioral: Negative.  Negative for depression, hallucinations and suicidal ideas.     Tolerating Diet: TPN      DRUG ALLERGIES:  No Known Allergies  VITALS:  Blood pressure 115/66, pulse 98, temperature 98.3 F (36.8 C), resp. rate (!) 32, height 5\' 10"  (1.778 m), weight 64 kg (141 lb 1.5 oz), SpO2 97 %.  PHYSICAL EXAMINATION:   Physical Exam  Constitutional: She is well-developed, well-nourished, and in no distress. No distress.  HENT:  Head: Normocephalic.  Eyes: No scleral icterus.  Neck: Normal range of motion. Neck supple. No JVD present. No tracheal deviation present.  Cardiovascular: Normal rate, regular rhythm and normal heart sounds.  Exam reveals no gallop and no friction rub.   No murmur heard. Pulmonary/Chest: Effort normal and breath sounds normal. No respiratory distress. She has no wheezes. She has  no rales. She exhibits no tenderness.  B/l chest tubes  Abdominal: She exhibits no distension and no mass. There is tenderness. There is no rebound and no guarding.  JP drains   Musculoskeletal: Normal range of motion. She exhibits no edema.  Neurological: She is alert.  Skin: Skin is warm. No rash noted. No erythema.  Psychiatric: Affect and judgment normal.      LABORATORY PANEL:   CBC  Recent Labs Lab 08/10/16 0431  WBC 26.5*  HGB 8.2*  HCT 25.9*  PLT 718*   ------------------------------------------------------------------------------------------------------------------  Chemistries   Recent Labs Lab 08/08/16 0607  08/10/16 0536  NA 136  < > 133*  K 2.5*  < > 3.5  CL 104  < > 103  CO2 23  < > 24  GLUCOSE 107*  < > 117*  BUN 9  < > 15  CREATININE <0.30*  < > <0.30*  CALCIUM 7.2*  < > 7.3*  MG 1.7  < > 1.8  AST 27  --   --   ALT 18  --   --   ALKPHOS 202*  --   --   BILITOT 0.5  --   --   < > = values in this interval not displayed. ------------------------------------------------------------------------------------------------------------------  Cardiac Enzymes No results for input(s): TROPONINI in the last 168 hours. ------------------------------------------------------------------------------------------------------------------  RADIOLOGY:  Dg Chest Port 1 View  Result Date: 08/09/2016 CLINICAL DATA:  51 year old female status post left-sided video assisted thoracoscopic surgery with decortication and bilateral chest tube placement EXAM: PORTABLE CHEST 1 VIEW COMPARISON:  Prior chest x-ray 08/08/2016 FINDINGS: Stable position of right upper extremity PICC. Catheter tip projects over  the upper right atrium. Right-sided chest tumor main send unchanged position. Right subdiaphragmatic abdominal drainage catheter remains in unchanged position. Interval development of a small (5%) right-sided pneumothorax. There are 2 left-sided chest tubes which remain in  unchanged position. Stable cardiac and mediastinal contours. Slightly increased atelectasis in the left lower lobe. Slightly increased atelectasis in the right lower lobe as well. Scattered patchy airspace opacities throughout both mid lungs. IMPRESSION: 1. Interval development of a small (5%) right apical pneumothorax. 2. The right-sided chest tube remains in unchanged position. 3. Lower inspiratory volumes with increased bibasilar atelectasis. 4. Patchy airspace opacities in both mid lungs which may represent multifocal pneumonia or areas of subsegmental atelectasis. 5. Other support apparatus in stable and unchanged position. These results will be called to the ordering clinician or representative by the Radiologist Assistant, and communication documented in the PACS or zVision Dashboard. Electronically Signed   By: Jacqulynn Cadet M.D.   On: 08/09/2016 08:39   Dg Chest Port 1 View  Result Date: 08/08/2016 CLINICAL DATA:  Thoracotomy, chest tube placement EXAM: PORTABLE CHEST 1 VIEW COMPARISON:  08/07/2016 and 0057 hours FINDINGS: Skin staples project over the periphery of the left hemithorax from recent thoracotomy with bilateral chest tubes in place, one on the right with side port projecting over the right cardiophrenic angle and what appear to be potentially three tubes on the left, two projecting up to the left lung apex and one along the periphery of the right lung base. A surgical drain also projects over the liver shadow berneath the right hemidiaphragm. Heart is borderline enlarged with mild interstitial edema. Right side PICC line tip in the proximal right atrium. Streaky atelectasis is noted at the left lung base and/or scarring with interval decrease in left-sided pleural fluid. No significant pneumothorax. Gastric tube has been removed. IMPRESSION: Status post left thoracotomy with bilateral chest tubes in place. Stable cardiomegaly with interval decrease in left-sided pleural fluid. No  significant pneumothorax identified. Surgical drain projects over right upper quadrant of the abdomen. Electronically Signed   By: Ashley Royalty M.D.   On: 08/08/2016 20:01     ASSESSMENT AND PLAN:    51 year old female admitted with the perforation peptic ulcer on fingers 16th and underwent emergent surgery with findings of purulent peritonitis who has had a complicated course.  1. Perforated peptic ulcer with complicated course since admission: POD #2 left thoracoscopy and thoracotomy for empyema  Continue Zosyn and fluconazole as requested by ID Follow up on final culture from drainage around the incision.  2. Hypokalemia: Replete as needed  3. Acute blood loss anemia: Patient transfused 3/6 Hemoglobin 8.2 this morning CBC for a.m. would transfuse if hemoglobin less than 7  4. Severe sepsis on admission: Resolved  5. PAF: Resolved  6. Hyponatremia: Sodium 133 this a.m. Continue to monitor 7. Anxiety; Ativan PRn   CODE STATUS: FULL  TOTAL TIME TAKING CARE OF THIS PATIENT: 23 minutes.   Physical therapy is recommending skilled nursing facility at discharge  POSSIBLE D/C ??, DEPENDING ON CLINICAL CONDITION.   Sedale Jenifer M.D on 08/10/2016 at 8:35 AM  Between 7am to 6pm - Pager - [phone] After 6pm go to [email or web address] - password [email or web address] [facility]  Office  [phone]  CC: Primary care physician; No PCP Per Patient  Note: This dictation was prepared with Dragon dictation along with smaller phrase technology. Any transcriptional errors that result from this process are unintentional.

## 2016-08-10 NOTE — Progress Notes (Signed)
2 Days Post-Op  Subjective: Patient feels much better today tolerating clears she is not feeling short of breath today  Objective: Vital signs in last 24 hours: Temp:  [98.3 F (36.8 C)-99 F (37.2 C)] 98.3 F (36.8 C) (03/09 0000) Pulse Rate:  [94-105] 98 (03/09 0600) Resp:  [28-48] 32 (03/09 0600) BP: (100-120)/(60-73) 115/66 (03/09 0600) SpO2:  [89 %-99 %] 97 % (03/09 0600) Weight:  [141 lb 1.5 oz (64 kg)] 141 lb 1.5 oz (64 kg) (03/09 0500) Last BM Date: 08/07/16  Intake/Output from previous day: 03/08 0701 - 03/09 0700 In: 2829.1 [I.V.:2154.1; IV Piggyback:665] Out: 1205 [Urine:875; Drains:30; Chest Tube:300] Intake/Output this shift: No intake/output data recorded.  Physical exam:  Respiratory removed rate remains in the 30s but she appears more comfortable Abdomen is soft nontender wound VAC in place Chest with less wheezing today drains functional  Chest x-ray reviewed  Lab Results: CBC   Recent Labs  08/09/16 0511 08/10/16 0431  WBC 39.8* 26.5*  HGB 8.8* 8.2*  HCT 27.9* 25.9*  PLT 741* 718*   BMET  Recent Labs  08/09/16 0511 08/10/16 0536  NA 132* 133*  K 3.7 3.5  CL 106 103  CO2 21* 24  GLUCOSE 179* 117*  BUN 14 15  CREATININE <0.30* <0.30*  CALCIUM 7.6* 7.3*   PT/INR No results for input(s): LABPROT, INR in the last 72 hours. ABG No results for input(s): PHART, HCO3 in the last 72 hours.  Invalid input(s): PCO2, PO2  Studies/Results: Dg Chest Port 1 View  Result Date: 08/09/2016 CLINICAL DATA:  51 year old female status post left-sided video assisted thoracoscopic surgery with decortication and bilateral chest tube placement EXAM: PORTABLE CHEST 1 VIEW COMPARISON:  Prior chest x-ray 08/08/2016 FINDINGS: Stable position of right upper extremity PICC. Catheter tip projects over the upper right atrium. Right-sided chest tumor main send unchanged position. Right subdiaphragmatic abdominal drainage catheter remains in unchanged position.  Interval development of a small (5%) right-sided pneumothorax. There are 2 left-sided chest tubes which remain in unchanged position. Stable cardiac and mediastinal contours. Slightly increased atelectasis in the left lower lobe. Slightly increased atelectasis in the right lower lobe as well. Scattered patchy airspace opacities throughout both mid lungs. IMPRESSION: 1. Interval development of a small (5%) right apical pneumothorax. 2. The right-sided chest tube remains in unchanged position. 3. Lower inspiratory volumes with increased bibasilar atelectasis. 4. Patchy airspace opacities in both mid lungs which may represent multifocal pneumonia or areas of subsegmental atelectasis. 5. Other support apparatus in stable and unchanged position. These results will be called to the ordering clinician or representative by the Radiologist Assistant, and communication documented in the PACS or zVision Dashboard. Electronically Signed   By: Jacqulynn Cadet M.D.   On: 08/09/2016 08:39   Dg Chest Port 1 View  Result Date: 08/08/2016 CLINICAL DATA:  Thoracotomy, chest tube placement EXAM: PORTABLE CHEST 1 VIEW COMPARISON:  08/07/2016 and 0057 hours FINDINGS: Skin staples project over the periphery of the left hemithorax from recent thoracotomy with bilateral chest tubes in place, one on the right with side port projecting over the right cardiophrenic angle and what appear to be potentially three tubes on the left, two projecting up to the left lung apex and one along the periphery of the right lung base. A surgical drain also projects over the liver shadow berneath the right hemidiaphragm. Heart is borderline enlarged with mild interstitial edema. Right side PICC line tip in the proximal right atrium. Streaky atelectasis is noted at  the left lung base and/or scarring with interval decrease in left-sided pleural fluid. No significant pneumothorax. Gastric tube has been removed. IMPRESSION: Status post left thoracotomy with  bilateral chest tubes in place. Stable cardiomegaly with interval decrease in left-sided pleural fluid. No significant pneumothorax identified. Surgical drain projects over right upper quadrant of the abdomen. Electronically Signed   By: Ashley Royalty M.D.   On: 08/08/2016 20:01    Anti-infectives: Anti-infectives    Start     Dose/Rate Route Frequency Ordered Stop   08/05/16 1500  vancomycin (VANCOCIN) IVPB 1000 mg/200 mL premix  Status:  Discontinued     1,000 mg 200 mL/hr over 60 Minutes Intravenous Every 12 hours 08/05/16 0753 08/06/16 0850   08/05/16 0800  vancomycin (VANCOCIN) IVPB 1000 mg/200 mL premix     1,000 mg 200 mL/hr over 60 Minutes Intravenous  Once 08/05/16 0753 08/05/16 0923   07/30/16 1815  fluconazole (DIFLUCAN) IVPB 400 mg     400 mg 100 mL/hr over 120 Minutes Intravenous Every 24 hours 07/30/16 1743     07/30/16 1528  piperacillin-tazobactam (ZOSYN) 3.375 GM/50ML IVPB    Comments:  LEWIS, CINDY: cabinet override      07/30/16 1528 07/30/16 1525   07/21/16 0000  piperacillin-tazobactam (ZOSYN) IVPB 3.375 g     3.375 g 12.5 mL/hr over 240 Minutes Intravenous Every 8 hours 07/20/16 1809     07/20/16 1430  piperacillin-tazobactam (ZOSYN) IVPB 3.375 g     3.375 g 100 mL/hr over 30 Minutes Intravenous  Once 07/20/16 1424 07/20/16 1603      Assessment/Plan: s/p Procedure(s): CHEST TUBE INSERTION VIDEO ASSISTED THORACOSCOPY (VATS)/THOROCOTOMY Possible Thoracotomy DEBRIDEMENT OF ABDOMINAL WALL ABSCESS   Patient is much improved. Continue current therapy will advance diet to full liquids likely DC TPN tomorrow Patient asked about oral Ativan instead of the IV form. I'm concerned that the patient will become very sedated with the oral form as the IV form is ordered when necessary. Therefore we will keep the IV form as needed.  Florene Glen, MD, FACS  08/10/2016 [id number]

## 2016-08-10 NOTE — Progress Notes (Signed)
Physical Therapy Treatment Patient Details Name: Lindsey Wolf MRN: [id number] DOB: 12-25-1965 Today's Date: 08/10/2016    History of Present Illness Pt is a 51 year old female status post complicated repair of peptic ulcer disease. She later underwent a repeat laparotomy for drainage of intra-abdominal abscesses. Pt diagnosed with Acute hypoxic respiratory failure with L chest tube placement, septic shock with hypotension, and A-fib with RVR.  Pt also has multiple JP drains and was intubated but is now extubated. Pt underwent L thoracotomy and thoracoscopy along with debridement of abdominal wall abscess on 08/08/16.  Pt now with B chest tubes, JP drains, and wound vac. Of note, small pneumothorax per chart review. Received continue upon transfer orders this date and RN cleared for particiaption with therapy.    PT Comments    Pt is making good progress towards goals with ability to sit at EOB with +2 assist. Pt reports pain with movement, mostly in abdomen, wound vac in place. Pt with multiple lines/leads increasing complexity of treatment. 2nd person also assisting in line management. Pt able to perform there-ex with improved technique. Increased dizziness with EOB sitting this date. May be able to progress to standing trial with RW next date pending medical stability. RR increased this date from 40-mid 62s. Pt responds well to verbal cues.   Follow Up Recommendations  CIR     Equipment Recommendations  None recommended by PT    Recommendations for Other Services Rehab consult     Precautions / Restrictions Precautions Precautions: Fall Restrictions Weight Bearing Restrictions: No    Mobility  Bed Mobility Overal bed mobility: Needs Assistance Bed Mobility: Supine to Sit     Supine to sit: +2 for physical assistance;Min assist     General bed mobility comments: assist for sliding B LEs off bed. 2nd person available to management of lines/leads. Increased pain noted in  abdomen with sitting upright. Complaints of dizziness noted, vitals WNL. Dizziness subsides withing a few minutes. Pt able to tolerate sitting with cga for approx 5 minutes prior to fatigue.  Transfers                 General transfer comment: not performed this date, not safe yet  Ambulation/Gait                 Stairs            Wheelchair Mobility    Modified Rankin (Stroke Patients Only)       Balance                                    Cognition Arousal/Alertness: Awake/alert Behavior During Therapy: WFL for tasks assessed/performed Overall Cognitive Status: Within Functional Limits for tasks assessed                      Exercises Other Exercises Other Exercises: Supine ther-ex performed on B LE including hip abd/add, SLRs, SAQ, LAQ, supine bridging, and B UE resisted elbow flexion/extension. All ther-ex perfomred x 12 reps with cga. Needed a few rest breaks secondary to SOB symptoms, although O2 sats WNL. Respirations increased to mid 76s. O2 at 1L of O2.    General Comments        Pertinent Vitals/Pain Pain Assessment: 0-10 Pain Score: 7  Pain Location: abdomen Pain Descriptors / Indicators: Operative site guarding Pain Intervention(s): Limited activity within patient's tolerance    Home Living  Prior Function            PT Goals (current goals can now be found in the care plan section) Acute Rehab PT Goals Patient Stated Goal: to get stronger PT Goal Formulation: With patient Time For Goal Achievement: 08/23/16 Potential to Achieve Goals: Good Progress towards PT goals: Progressing toward goals    Frequency    7X/week      PT Plan Discharge plan needs to be updated;Frequency needs to be updated    Co-evaluation             End of Session Equipment Utilized During Treatment: Oxygen Activity Tolerance: Treatment limited secondary to medical complications  (Comment) Patient left: in bed;with call bell/phone within reach;with family/visitor present Nurse Communication: Mobility status PT Visit Diagnosis: Muscle weakness (generalized) (M62.81);Pain;Difficulty in walking, not elsewhere classified (R26.2) Pain - part of body:  (abdomen)     Time: 5366-4403 PT Time Calculation (min) (ACUTE ONLY): 27 min  Charges:  $Therapeutic Exercise: 8-22 mins $Therapeutic Activity: 8-22 mins                    G Codes:       Millicent Blazejewski 2016/08/20, 11:37 AM Greggory Stallion, PT, DPT [phone]

## 2016-08-10 NOTE — Progress Notes (Signed)
Pt remains in stable condition. Pain remains an issue for this patient. Mild relief achieved with repositioning, PRN medication, and environmental changes. Pt is alert and oriented. No drainage in JP drains, or wound vac. Pt changed from foley to external catheter. Pt also had moderate output from chest tube, serous to serosanguinous, output in chart.  Full assessment in EPIC. Report given to Ginger, Therapist, sports.

## 2016-08-10 NOTE — Progress Notes (Signed)
Dr.Cooper called requesting clarification on orders for negative pressure wound therapy and discontinue foley. Will continue to assess.

## 2016-08-10 NOTE — Progress Notes (Addendum)
Nutrition Follow-up  DOCUMENTATION CODES:   Not applicable  INTERVENTION:  -Discussed TPN plan with MD Burt Knack; will decrease TPN to rate of 40 ml/hr in anticipation of d/c of TPN tomorrow if pt tolerating diet advancement -Recommend addition of Ensure Enlive po TID, each supplement provides 350 kcal and 20 grams of protein  NUTRITION DIAGNOSIS:   Inadequate oral intake related to inability to eat as evidenced by NPO status.  Being addressed via diet advancement, supplement, TPN titration  GOAL:   Patient will meet greater than or equal to 90% of their needs  Progressing  MONITOR:   I & O's, Diet advancement, Labs, Weight trends, Supplement acceptance  REASON FOR ASSESSMENT:   Malnutrition Screening Tool    ASSESSMENT:   5%AA/20%Dextrose at rate of 83 ml/hr with 20% ILE at rate of 20 ml/hr for 12 hours Wound Vac, Chest tubes, JP drains in place Tolerated CL diet, diet advanced to FL today Labs: sodium 133 Meds: reviewed  Diet Order:  TPN (CLINIMIX-E) Adult Diet full liquid Room service appropriate? Yes; Fluid consistency: Thin  Skin:  Wound (see comment) (stage II pressure ulcer in nare)  Last BM:  08/07/16  Height:   Ht Readings from Last 1 Encounters:  08/08/16 5\' 10"  (1.778 m)    Weight:   Wt Readings from Last 1 Encounters:  08/10/16 141 lb 1.5 oz (64 kg)    Ideal Body Weight:  68.18 kg  BMI:  Body mass index is 20.24 kg/m.  Estimated Nutritional Needs:   Kcal:  2050-2400 kcals  Protein:  102-136 g  Fluid:  >/= 2 L  EDUCATION NEEDS:   No education needs identified at this time  [place], [place], LDN [phone] Pager  [phone] Weekend/On-Call Pager

## 2016-08-10 NOTE — Progress Notes (Signed)
[facility] INFECTIOUS DISEASE PROGRESS NOTE Date of Admission:  07/20/2016     ID: Lindsey Wolf is a 51 y.o. female with  intrabdominal infection post perforated peptic ulcer Active Problems:   Perforated ulcer ([place])   Abdominal abscess ([place])   Abnormal respirations   Pleural effusion   Respiratory distress   Empyema of left pleural space (HCC)   Subjective: S/p decortication, has bil chest tubes, feels a little better.   ROS  Eleven systems are reviewed and negative except per hpi  Medications:  Antibiotics Given (last 72 hours)    Date/Time Action Medication Dose Rate   08/07/16 1515 Given   piperacillin-tazobactam (ZOSYN) IVPB 3.375 g 3.375 g 12.5 mL/hr   08/07/16 2333 Given   piperacillin-tazobactam (ZOSYN) IVPB 3.375 g 3.375 g 12.5 mL/hr   08/08/16 1610 Given   piperacillin-tazobactam (ZOSYN) IVPB 3.375 g 3.375 g 12.5 mL/hr   08/08/16 1531 Given   piperacillin-tazobactam (ZOSYN) IVPB 3.375 g 3.375 g    08/08/16 2232 Given   piperacillin-tazobactam (ZOSYN) IVPB 3.375 g 3.375 g 12.5 mL/hr   08/09/16 0521 Given   piperacillin-tazobactam (ZOSYN) IVPB 3.375 g 3.375 g 12.5 mL/hr   08/09/16 1330 Given   piperacillin-tazobactam (ZOSYN) IVPB 3.375 g 3.375 g 12.5 mL/hr   08/09/16 2211 Given   piperacillin-tazobactam (ZOSYN) IVPB 3.375 g 3.375 g 12.5 mL/hr   08/10/16 0547 Given   piperacillin-tazobactam (ZOSYN) IVPB 3.375 g 3.375 g 12.5 mL/hr   08/10/16 1422 Given   piperacillin-tazobactam (ZOSYN) IVPB 3.375 g 3.375 g 12.5 mL/hr     . enoxaparin (LOVENOX) injection  40 mg Subcutaneous Q24H  . feeding supplement (ENSURE ENLIVE)  237 mL Oral TID BM  . fluconazole (DIFLUCAN) IV  400 mg Intravenous Q24H  . insulin aspart  0-9 Units Subcutaneous Q4H  . mouth rinse  15 mL Mouth Rinse BID  . pantoprazole (PROTONIX) IV  40 mg Intravenous Q12H  . piperacillin-tazobactam (ZOSYN)  IV  3.375 g Intravenous Q8H  . potassium chloride (KCL MULTIRUN) 30 mEq in 265 mL IVPB  30 mEq  Intravenous Once  . sodium chloride flush  10-40 mL Intracatheter Q12H    Objective: Vital signs in last 24 hours: Temp:  [98.3 F (36.8 C)-98.9 F (37.2 C)] 98.9 F (37.2 C) (03/09 1200) Pulse Rate:  [94-108] 108 (03/09 0800) Resp:  [28-48] 40 (03/09 0900) BP: (106-125)/(60-75) 125/63 (03/09 0900) SpO2:  [89 %-99 %] 99 % (03/09 0800) Weight:  [64 kg (141 lb 1.5 oz)] 64 kg (141 lb 1.5 oz) (03/09 0500) Constitutional:  Ill appearing HENT: [place]/AT, PERRLA, no scleral icterus Mouth/Throat: Oropharynx is clear and dry. No oropharyngeal exudate.  Cardiovascular: Normal rate, regular rhythm and normal heart sounds.  Pulmonary/Chest: bil rhonchi Bil chest tubes with ss drainage Neck = supple, no nuchal rigidity Abdominal: Soft. abd mildly distended, inciision along midline covered with wound vac 3 JP drains in place, 2 on R - one has purulent material, other ss  1 on L with ss drainage. abd mildly tender Lymphadenopathy: no cervical adenopathy. No axillary adenopathy Neurological: alert and oriented to person, place, and time.  Skin: Skin is warm and dry. No rash noted. No erythema.  PICC RUE wnl Foley cath in place Psychiatric: a normal mood and affect.  behavior is normal.   Lab Results  Recent Labs  08/09/16 0511 08/10/16 0431 08/10/16 0536  WBC 39.8* 26.5*  --   HGB 8.8* 8.2*  --   HCT 27.9* 25.9*  --  NA 132*  --  133*  K 3.7  --  3.5  CL 106  --  103  CO2 21*  --  24  BUN 14  --  15  CREATININE <0.30*  --  <0.30*    Microbiology: Results for orders placed or performed during the hospital encounter of 07/20/16  [place] rt PCR ([place] only)     Status: None   Collection Time: 07/20/16  1:15 PM  Result Value Ref Range Status   Specimen source GC/Chlam ENDOCERVICAL  Final   Chlamydia Tr NOT DETECTED NOT DETECTED Final   N gonorrhoeae NOT DETECTED NOT DETECTED Final    Comment: (NOTE) 100  This methodology has not been evaluated in pregnant women or in 200   patients with a history of hysterectomy. 300 400  This methodology will not be performed on patients less than 79  years of age.   Wet prep, genital     Status: Abnormal   Collection Time: 07/20/16  1:15 PM  Result Value Ref Range Status   Yeast Wet Prep HPF POC NONE SEEN NONE SEEN Final   Trich, Wet Prep NONE SEEN NONE SEEN Final   Clue Cells Wet Prep HPF POC NONE SEEN NONE SEEN Final   WBC, Wet Prep HPF POC RARE (A) NONE SEEN Final   Sperm NONE SEEN  Final  Blood culture (routine x 2)     Status: None   Collection Time: 07/20/16  3:07 PM  Result Value Ref Range Status   Specimen Description BLOOD left forearm  Final   Special Requests   Final    BOTTLES DRAWN AEROBIC AND ANAEROBIC AER12ML ANA12ML   Culture NO GROWTH 5 DAYS  Final   Report Status 07/25/2016 FINAL  Final  MRSA PCR Screening     Status: None   Collection Time: 07/20/16  9:29 PM  Result Value Ref Range Status   MRSA by PCR NEGATIVE NEGATIVE Final    Comment:        The GeneXpert MRSA Assay (FDA approved for NASAL specimens only), is one component of a comprehensive MRSA colonization surveillance program. It is not intended to diagnose MRSA infection nor to guide or monitor treatment for MRSA infections.   Aerobic/Anaerobic Culture (surgical/deep wound)     Status: None   Collection Time: 07/25/16  3:00 PM  Result Value Ref Range Status   Specimen Description ABSCESS RIGHT ABDOMEN  Final   Special Requests NONE  Final   Gram Stain   Final    RARE WBC PRESENT, PREDOMINANTLY PMN NO ORGANISMS SEEN    Culture   Final    No growth aerobically or anaerobically. Performed at [facility], [place] [address] [place], [place] [address]    Report Status 07/31/2016 FINAL  Final  Body fluid culture     Status: None   Collection Time: 07/30/16 11:40 AM  Result Value Ref Range Status   Specimen Description PLEURAL  Final   Special Requests NONE  Final   Gram Stain   Final    MODERATE WBC PRESENT,  PREDOMINANTLY PMN NO ORGANISMS SEEN    Culture   Final    No growth aerobically or anaerobically. Performed at [facility], [place] [address], [place], [place] [address]    Report Status 08/03/2016 FINAL  Final  Acid Fast Smear (AFB)     Status: None   Collection Time: 07/30/16 11:40 AM  Result Value Ref Range Status   AFB Specimen  Processing Concentration  Final   Acid Fast Smear Negative  Final    Comment: (NOTE) Performed At: [facility] [place], [place] [id number] Lindon Romp MD EL:[id number]    Source (AFB) PLEURAL  Final  Aerobic Culture (superficial specimen)     Status: None   Collection Time: 08/06/16  3:34 PM  Result Value Ref Range Status   Specimen Description ABDOMEN  Final   Special Requests Normal  Final   Gram Stain   Final    RARE WBC PRESENT,BOTH PMN AND MONONUCLEAR NO ORGANISMS SEEN Performed at [facility], [address], [place], [place] [address]    Culture RARE ESCHERICHIA COLI  Final   Report Status 08/09/2016 FINAL  Final   Organism ID, Bacteria ESCHERICHIA COLI  Final      Susceptibility   Escherichia coli - MIC*    AMPICILLIN <=2 SENSITIVE Sensitive     CEFAZOLIN <=4 SENSITIVE Sensitive     CEFEPIME <=1 SENSITIVE Sensitive     CEFTAZIDIME <=1 SENSITIVE Sensitive     CEFTRIAXONE <=1 SENSITIVE Sensitive     CIPROFLOXACIN <=0.25 SENSITIVE Sensitive     GENTAMICIN <=1 SENSITIVE Sensitive     IMIPENEM <=0.25 SENSITIVE Sensitive     TRIMETH/SULFA <=20 SENSITIVE Sensitive     AMPICILLIN/SULBACTAM <=2 SENSITIVE Sensitive     PIP/TAZO <=4 SENSITIVE Sensitive     Extended ESBL NEGATIVE Sensitive     * RARE ESCHERICHIA COLI  Aerobic/Anaerobic Culture (surgical/deep wound)     Status: None (Preliminary result)   Collection Time: 08/08/16  3:31 PM  Result Value Ref Range Status   Specimen Description WOUND  Final   Special Requests ABDOMINAL WALL WOUND  Final   Gram Stain   Final    ABUNDANT WBC PRESENT,  PREDOMINANTLY PMN RARE GRAM POSITIVE COCCI IN PAIRS    Culture   Final    FEW GRAM NEGATIVE RODS HOLDING FOR POSSIBLE ANAEROBE Performed at [facility], [address] [place], [place] [address]    Report Status PENDING  Incomplete  Aerobic/Anaerobic Culture (surgical/deep wound)     Status: None (Preliminary result)   Collection Time: 08/08/16  3:31 PM  Result Value Ref Range Status   Specimen Description WOUND  Final   Special Requests ABDOMINAL WALL WOUND 2  Final   Gram Stain   Final    ABUNDANT WBC PRESENT, PREDOMINANTLY PMN RARE GRAM POSITIVE COCCI IN PAIRS FEW GRAM POSITIVE RODS    Culture   Final    RARE GRAM NEGATIVE RODS HOLDING FOR POSSIBLE ANAEROBE Performed at [facility], [place] [address] [place], [place] [address]    Report Status PENDING  Incomplete  Aerobic/Anaerobic Culture (surgical/deep wound)     Status: None (Preliminary result)   Collection Time: 08/08/16  3:31 PM  Result Value Ref Range Status   Specimen Description WOUND  Final   Special Requests RIGHT PLURAL SPACE  Final   Gram Stain   Final    FEW WBC PRESENT,BOTH PMN AND MONONUCLEAR NO ORGANISMS SEEN    Culture   Final    NO GROWTH 2 DAYS NO ANAEROBES ISOLATED; CULTURE IN PROGRESS FOR 5 DAYS Performed at [facility], [place] [address], [place], [place] [address]    Report Status PENDING  Incomplete  Aerobic/Anaerobic Culture (surgical/deep wound)     Status: None (Preliminary result)   Collection Time: 08/08/16  3:31 PM  Result Value Ref Range Status  Specimen Description WOUND  Final   Special Requests LEFT PLERAL SPACE  Final   Gram Stain   Final    FEW WBC PRESENT,BOTH PMN AND MONONUCLEAR NO ORGANISMS SEEN    Culture   Final    MODERATE BACTEROIDES FRAGILIS BETA LACTAMASE POSITIVE Performed at [facility], [address] [place], [place] [address]    Report Status PENDING  Incomplete  Aerobic/Anaerobic Culture (surgical/deep wound)     Status: None  (Preliminary result)   Collection Time: 08/08/16  3:31 PM  Result Value Ref Range Status   Specimen Description WOUND  Final   Special Requests PLEURAL PEEL LEFT  Final   Gram Stain   Final    FEW WBC PRESENT,BOTH PMN AND MONONUCLEAR NO ORGANISMS SEEN    Culture   Final    FEW BACTEROIDES FRAGILIS BETA LACTAMASE POSITIVE HOLDING FOR POSSIBLE ANAEROBE Performed at [facility], [place] [address] [place], [place] [address]    Report Status PENDING  Incomplete    Studies/Results: Dg Chest Port 1 View  Result Date: 08/10/2016 CLINICAL DATA:  Respiratory failure EXAM: PORTABLE CHEST 1 VIEW COMPARISON:  08/09/2016 FINDINGS: Bilateral chest tubes remain in place. Small right apical pneumothorax is stable. No left pneumothorax. Right PICC line tip is in the SVC, stable. Stable cardiomegaly. Bilateral perihilar and lower lobe airspace opacities are again noted, not significantly changed. Small bilateral effusions. IMPRESSION: Bilateral perihilar and lower lobe airspace opacities. Small right apical pneumothorax, stable. Electronically Signed   By: Rolm Baptise M.D.   On: 08/10/2016 09:03   Dg Chest Port 1 View  Result Date: 08/09/2016 CLINICAL DATA:  51 year old female status post left-sided video assisted thoracoscopic surgery with decortication and bilateral chest tube placement EXAM: PORTABLE CHEST 1 VIEW COMPARISON:  Prior chest x-ray 08/08/2016 FINDINGS: Stable position of right upper extremity PICC. Catheter tip projects over the upper right atrium. Right-sided chest tumor main send unchanged position. Right subdiaphragmatic abdominal drainage catheter remains in unchanged position. Interval development of a small (5%) right-sided pneumothorax. There are 2 left-sided chest tubes which remain in unchanged position. Stable cardiac and mediastinal contours. Slightly increased atelectasis in the left lower lobe. Slightly increased atelectasis in the right lower lobe as well. Scattered patchy  airspace opacities throughout both mid lungs. IMPRESSION: 1. Interval development of a small (5%) right apical pneumothorax. 2. The right-sided chest tube remains in unchanged position. 3. Lower inspiratory volumes with increased bibasilar atelectasis. 4. Patchy airspace opacities in both mid lungs which may represent multifocal pneumonia or areas of subsegmental atelectasis. 5. Other support apparatus in stable and unchanged position. These results will be called to the ordering clinician or representative by the Radiologist Assistant, and communication documented in the PACS or zVision Dashboard. Electronically Signed   By: Jacqulynn Cadet M.D.   On: 08/09/2016 08:39   Dg Chest Port 1 View  Result Date: 08/08/2016 CLINICAL DATA:  Thoracotomy, chest tube placement EXAM: PORTABLE CHEST 1 VIEW COMPARISON:  08/07/2016 and 0057 hours FINDINGS: Skin staples project over the periphery of the left hemithorax from recent thoracotomy with bilateral chest tubes in place, one on the right with side port projecting over the right cardiophrenic angle and what appear to be potentially three tubes on the left, two projecting up to the left lung apex and one along the periphery of the right lung base. A surgical drain also projects over the liver shadow berneath the right hemidiaphragm. Heart is borderline enlarged with mild interstitial edema. Right  side PICC line tip in the proximal right atrium. Streaky atelectasis is noted at the left lung base and/or scarring with interval decrease in left-sided pleural fluid. No significant pneumothorax. Gastric tube has been removed. IMPRESSION: Status post left thoracotomy with bilateral chest tubes in place. Stable cardiomegaly with interval decrease in left-sided pleural fluid. No significant pneumothorax identified. Surgical drain projects over right upper quadrant of the abdomen. Electronically Signed   By: Ashley Royalty M.D.   On: 08/08/2016 20:01    Assessment/Plan: Lindsey Wolf is a 51 y.o. female with perforated peptic ulcer, with complicated course since admission with placement of an IR drain and then repeat surgery 2/26. All cultures have been negative. She has been on zosyn since admission and started fluconazole 2/26.  Multiple drains in place.  PICC in place and on TPN.  Had L empyema noted and s/p decortication with about 500 cc pus aspirated with cx growing bacteroides. Abd wound cx with E coli- sensitive to zosyn. Fungal cxs neg to date WBC down 39->26 after surgery Her R abd JP drain has some purulent drainage  Recommendations Cont zosyn  Can continue fluconazole for now but if neg cxs on Monday can dc Continue management of chest tubes per Dr Genevive Bi  Thank you very much for the consult. Will follow with you.  FITZGERALD, DAVID P   08/10/2016, 2:49 PM

## 2016-08-10 NOTE — Plan of Care (Signed)
Problem: Safety: Goal: Ability to remain free from injury will improve Outcome: Progressing Pt remained free from falls on my shift.  All tubes and drains remain in place.   Problem: Pain Managment: Goal: General experience of comfort will improve Outcome: Not Progressing Pt receiving PRN pain management. Mild relieve. Pt able to gain minimal relief from enviromental changes and repositioning. Pt required pain medicine intervention 2x on my shift.   Problem: Skin Integrity: Goal: Risk for impaired skin integrity will decrease Outcome: Not Progressing Pt continues to have poor PO intake. Pt remains on TPN at this time. Pt educated on importance of nutrition. Pt states "Just not hungry right now, maybe later.

## 2016-08-10 NOTE — Progress Notes (Signed)
Per Dr.Simmonds gave orders to contact Dr. Genevive Bi and Dr. Burt Knack about patient transfer.  Dr. Genevive Bi and Dr. Burt Knack contacted to ask about moving patient to the floor. Dr. Burt Knack declined the transfer.Will continue to assess.

## 2016-08-11 LAB — BASIC METABOLIC PANEL
ANION GAP: 7 (ref 5–15)
BUN: 11 mg/dL (ref 6–20)
CALCIUM: 7.2 mg/dL — AB (ref 8.9–10.3)
CO2: 25 mmol/L (ref 22–32)
Chloride: 103 mmol/L (ref 101–111)
Glucose, Bld: 93 mg/dL (ref 65–99)
Potassium: 3.3 mmol/L — ABNORMAL LOW (ref 3.5–5.1)
SODIUM: 135 mmol/L (ref 135–145)

## 2016-08-11 LAB — CBC
HEMATOCRIT: 25.2 % — AB (ref 35.0–47.0)
Hemoglobin: 7.9 g/dL — ABNORMAL LOW (ref 12.0–16.0)
MCH: 24.4 pg — ABNORMAL LOW (ref 26.0–34.0)
MCHC: 31.4 g/dL — ABNORMAL LOW (ref 32.0–36.0)
MCV: 77.7 fL — ABNORMAL LOW (ref 80.0–100.0)
PLATELETS: 803 10*3/uL — AB (ref 150–440)
RBC: 3.24 MIL/uL — ABNORMAL LOW (ref 3.80–5.20)
RDW: 24.6 % — AB (ref 11.5–14.5)
WBC: 32.1 10*3/uL — AB (ref 3.6–11.0)

## 2016-08-11 LAB — GLUCOSE, CAPILLARY
Glucose-Capillary: 88 mg/dL (ref 65–99)
Glucose-Capillary: 95 mg/dL (ref 65–99)
Glucose-Capillary: 87 mg/dL (ref 65–99)
Glucose-Capillary: 111 mg/dL — ABNORMAL HIGH (ref 65–99)
Glucose-Capillary: 90 mg/dL (ref 65–99)

## 2016-08-11 LAB — MAGNESIUM: Magnesium: 1.7 mg/dL (ref 1.7–2.4)

## 2016-08-11 MED ORDER — ERTAPENEM SODIUM 1 G IJ SOLR
1.0000 g | INTRAMUSCULAR | Status: DC
Start: 2016-08-11 — End: 2016-08-15
  Administered 2016-08-11 – 2016-08-15 (×4): 1 g via INTRAVENOUS
  Filled 2016-08-11 (×7): qty 1

## 2016-08-11 NOTE — Progress Notes (Signed)
Physical Therapy Treatment Patient Details Name: Lindsey Wolf MRN: [id number] DOB: 1965/07/25 Today's Date: 08/11/2016    History of Present Illness Pt is a 51 year old female status post complicated repair of peptic ulcer disease. She later underwent a repeat laparotomy for drainage of intra-abdominal abscesses. Pt diagnosed with Acute hypoxic respiratory failure with L chest tube placement, septic shock with hypotension, and A-fib with RVR.  Pt also has multiple JP drains and was intubated but is now extubated. Pt underwent L thoracotomy and thoracoscopy along with debridement of abdominal wall abscess on 08/08/16.  Pt now with B chest tubes, JP drains, and wound vac. Of note, small pneumothorax per chart review.      PT Comments    Participated in exercises as described below.  Tolerated exercised well with short rest periods.  After exercises, nursing in to transfer pt out of ICU.  Assisted pt with bed pan and rolling left and right for transfers.  Pt voiced fear of leaving ICU.  Encouragement given.  Fatigued from activities.  Will continue as appropriate.   Follow Up Recommendations  CIR     Equipment Recommendations  None recommended by PT    Recommendations for Other Services Rehab consult     Precautions / Restrictions Restrictions Weight Bearing Restrictions: No    Mobility  Bed Mobility Overal bed mobility: Needs Assistance Bed Mobility: Rolling Rolling: Mod assist;+2 for physical assistance         General bed mobility comments: requried +2 assist for rolling for comfort  Transfers                 General transfer comment: not performed this date, - pt fearful - "my feet felt like clubs last time I tried"  Ambulation/Gait                 Stairs            Wheelchair Mobility    Modified [place] (Stroke Patients Only)       Balance                                    Cognition Arousal/Alertness:  Awake/alert Behavior During Therapy: WFL for tasks assessed/performed Overall Cognitive Status: Within Functional Limits for tasks assessed                      Exercises Total Joint Exercises Ankle Circles/Pumps: Both;10 reps;15 reps;AROM Short Arc Quad: Both;10 reps;AROM Heel Slides: Both;10 reps;AROM Hip ABduction/ADduction: Both;10 reps;AROM Straight Leg Raises: Both;10 reps;AROM Other Exercises Other Exercises: assisted on/off bed pan and rolling left and right during lateral transfer from ICU to regular unit bed    General Comments        Pertinent Vitals/Pain Pain Assessment: 0-10 Pain Score: 7  Pain Location: abdomen Pain Descriptors / Indicators: Operative site guarding Pain Intervention(s): Limited activity within patient's tolerance    Home Living                      Prior Function            PT Goals (current goals can now be found in the care plan section) Progress towards PT goals: Progressing toward goals    Frequency    7X/week      PT Plan      Co-evaluation  End of Session Equipment Utilized During Treatment: Oxygen Activity Tolerance: Patient limited by fatigue Patient left: in bed;with bed alarm set;with nursing/sitter in room         Time: 8185-6314 PT Time Calculation (min) (ACUTE ONLY): 26 min  Charges:  $Therapeutic Exercise: 8-22 mins $Therapeutic Activity: 8-22 mins                    G Codes:       Chesley Noon 08/17/2016, 11:06 AM

## 2016-08-11 NOTE — Progress Notes (Signed)
[facility] NAME: Lindsey Wolf    MR#:  [id number]  DATE OF BIRTH:  [birth year]  SUBJECTIVE:  No issues this am   REVIEW OF SYSTEMS:    Review of Systems  Constitutional: Negative for chills, fever and malaise/fatigue.  HENT: Negative.  Negative for ear discharge, ear pain, hearing loss, nosebleeds and sore throat.   Eyes: Negative.  Negative for blurred vision and pain.  Respiratory: Negative.  Negative for cough, hemoptysis, shortness of breath and wheezing.   Cardiovascular: Negative.  Negative for chest pain, palpitations and leg swelling.  Gastrointestinal: Positive for abdominal pain. Negative for blood in stool, diarrhea, nausea and vomiting.  Genitourinary: Negative.  Negative for dysuria.  Musculoskeletal: Negative for back pain.  Skin: Negative.   Neurological: Positive for weakness. Negative for dizziness, tremors, speech change, focal weakness, seizures and headaches.  Endo/Heme/Allergies: Negative.  Does not bruise/bleed easily.  Psychiatric/Behavioral: Negative.  Negative for depression, hallucinations and suicidal ideas.     Tolerating Diet: TPN Clear liquid diet      DRUG ALLERGIES:  No Known Allergies  VITALS:  Blood pressure 111/61, pulse 91, temperature 98.1 F (36.7 C), temperature source Oral, resp. rate (!) 31, height 5\' 10"  (1.778 m), weight 60.5 kg (133 lb 6.1 oz), SpO2 100 %.  PHYSICAL EXAMINATION:   Physical Exam  Constitutional: She is well-developed, well-nourished, and in no distress. No distress.  HENT:  Head: Normocephalic.  Eyes: No scleral icterus.  Neck: Normal range of motion. Neck supple. No JVD present. No tracheal deviation present.  Cardiovascular: Normal rate, regular rhythm and normal heart sounds.  Exam reveals no gallop and no friction rub.   No murmur heard. Pulmonary/Chest: Effort normal and breath sounds normal. No respiratory distress. She has no wheezes. She has no  rales. She exhibits no tenderness.  B/l chest tubes  Abdominal: She exhibits no distension and no mass. There is tenderness. There is no rebound and no guarding.  JP drains  Wound vac  Musculoskeletal: Normal range of motion. She exhibits no edema.  Neurological: She is alert.  Skin: Skin is warm. No rash noted. No erythema.  Psychiatric: Affect and judgment normal.      LABORATORY PANEL:   CBC  Recent Labs Lab 08/11/16 0522  WBC 32.1*  HGB 7.9*  HCT 25.2*  PLT 803*   ------------------------------------------------------------------------------------------------------------------  Chemistries   Recent Labs Lab 08/08/16 0607  08/11/16 0522  NA 136  < > 135  K 2.5*  < > 3.3*  CL 104  < > 103  CO2 23  < > 25  GLUCOSE 107*  < > 93  BUN 9  < > 11  CREATININE <0.30*  < > <0.30*  CALCIUM 7.2*  < > 7.2*  MG 1.7  < > 1.7  AST 27  --   --   ALT 18  --   --   ALKPHOS 202*  --   --   BILITOT 0.5  --   --   < > = values in this interval not displayed. ------------------------------------------------------------------------------------------------------------------  Cardiac Enzymes No results for input(s): TROPONINI in the last 168 hours. ------------------------------------------------------------------------------------------------------------------  RADIOLOGY:  Dg Chest Port 1 View  Result Date: 08/10/2016 CLINICAL DATA:  Respiratory failure EXAM: PORTABLE CHEST 1 VIEW COMPARISON:  08/09/2016 FINDINGS: Bilateral chest tubes remain in place. Small right apical pneumothorax is stable. No left pneumothorax. Right PICC line tip is in the SVC, stable. Stable cardiomegaly. Bilateral perihilar  and lower lobe airspace opacities are again noted, not significantly changed. Small bilateral effusions. IMPRESSION: Bilateral perihilar and lower lobe airspace opacities. Small right apical pneumothorax, stable. Electronically Signed   By: Rolm Baptise M.D.   On: 08/10/2016 09:03      ASSESSMENT AND PLAN:    51 year old female admitted with the perforation peptic ulcer on fingers 16th and underwent emergent surgery with findings of purulent peritonitis who has had a complicated course.  1. Perforated peptic ulcer with complicated course since admission: POD #3 left thoracoscopy and thoracotomy for empyema  Continue Zosyn and continue fluconazole for now but if neg cxs on Monday can dc Fluconazole. Follow up on final culture from drainage around the incision.  2. Hypokalemia: Replete as needed  3. Acute blood loss anemia: Patient transfused 3/6 Hemoglobin 7.9 this morning Would transfuse if hemoglobin less than 7.5  4. Severe sepsis on admission: Resolved  5. PAF: Resolved  6. Hyponatremia: Sodium 135 this a.m. Continue to monitor 7. Anxiety; Ativan PRn  8. Hypokalemia: replete as per Pharmacy consult.   CODE STATUS: FULL  TOTAL TIME TAKING CARE OF THIS PATIENT: 23 minutes.   Physical therapy is recommending skilled nursing facility at discharge  POSSIBLE D/C ??, DEPENDING ON CLINICAL CONDITION.   Zineb Glade M.D on 08/11/2016 at 9:23 AM  Between 7am to 6pm - Pager - [phone] After 6pm go to [email or web address] - password [email or web address] [facility]  Office  [phone]  CC: Primary care physician; No PCP Per Patient  Note: This dictation was prepared with Dragon dictation along with smaller phrase technology. Any transcriptional errors that result from this process are unintentional.

## 2016-08-11 NOTE — Progress Notes (Signed)
Her post op care is per general surgery and abx management is per ID. I will "keep an eye on her" while she is in ICU/SDU and am available for anything that I might be able to help with. Please assess for transfer to telemetry each day. She is tachypneic but is not in overt distress and has been stable or improving since most recent surgery.  Merton Border, MD PCCM service Mobile [phone] Pager [phone] 08/11/2016

## 2016-08-11 NOTE — Plan of Care (Signed)
Problem: Education: Goal: Knowledge of [facility] Education information/materials will improve Outcome: Progressing POC and treatment reviewed with pt, all questions answered.  Problem: Safety: Goal: Ability to remain free from injury will improve Outcome: Progressing Pt free from falls, call bell within reach. Pt stood to side of bed with assist x 2.   Problem: Pain Managment: Goal: General experience of comfort will improve Outcome: Progressing Pt with c/o pain to thoracotomy site (left chest), prn pain meds given per MD order.   Problem: Physical Regulation: Goal: Ability to maintain clinical measurements within normal limits will improve Outcome: Progressing PT to work with pt today, pt with bil LE weakness. ? Foot drop to bil feet. Goal: Will remain free from infection Outcome: Progressing Pt afebrile, will cont to monitor WBC.  Problem: Skin Integrity: Goal: Risk for impaired skin integrity will decrease Outcome: Progressing Pt refuses to turn herself q2hours d/t pain and SOB. Importance of turning reinforced with pt, pulm toilet.   Problem: Tissue Perfusion: Goal: Risk factors for ineffective tissue perfusion will decrease Outcome: Progressing Pt on Lovenox therapy per md order.  Problem: Activity: Goal: Risk for activity intolerance will decrease Outcome: Progressing Pt able to stand at side of bed with RN and CNA, breathing techniques reinforced.   Problem: Nutrition: Goal: Adequate nutrition will be maintained Outcome: Progressing Pt on FLD, pt requested ice water on multiple occassions.   Problem: Bowel/Gastric: Goal: Will not experience complications related to bowel motility Outcome: Progressing Pt stated passing gas but no BM noted, pt denies nausea.   Problem: Pain Management: Goal: Pain level will decrease Outcome: Progressing Pt with increased pain with oob activity, NP ordered 1 time dose of Dilaudid.   Problem: Urinary  Elimination: Goal: Will remain free from infection Outcome: Progressing Foley d/c'd on day shift.

## 2016-08-11 NOTE — Progress Notes (Signed)
3 Days Post-Op  Subjective: Patient feels well today she is tolerating her full liquid diet. No fevers or chills  Objective: Vital signs in last 24 hours: Temp:  [98.1 F (36.7 C)-98.9 F (37.2 C)] 98.1 F (36.7 C) (03/10 0200) Pulse Rate:  [83-116] 91 (03/10 0800) Resp:  [17-42] 31 (03/10 0900) BP: (111-132)/(61-84) 111/61 (03/10 0900) SpO2:  [94 %-100 %] 100 % (03/10 0800) Weight:  [133 lb 6.1 oz (60.5 kg)] 133 lb 6.1 oz (60.5 kg) (03/10 0500) Last BM Date: 08/07/16  Intake/Output from previous day: 03/09 0701 - 03/10 0700 In: 2352.2 [P.O.:150; I.V.:1587.2; IV Piggyback:615] Out: 2965.5 [Urine:2200; Drains:85.5; Chest Tube:680] Intake/Output this shift: Total I/O In: 196.7 [P.O.:90; I.V.:106.7] Out: 305 [Urine:125; Drains:75; Chest Tube:105]  Physical exam:  Less wheezing today wound is clean no erythema abdomen is soft nondistended nontender drains are all serous.  Lab Results: CBC   Recent Labs  08/10/16 0431 08/11/16 0522  WBC 26.5* 32.1*  HGB 8.2* 7.9*  HCT 25.9* 25.2*  PLT 718* 803*   BMET  Recent Labs  08/10/16 0536 08/11/16 0522  NA 133* 135  K 3.5 3.3*  CL 103 103  CO2 24 25  GLUCOSE 117* 93  BUN 15 11  CREATININE <0.30* <0.30*  CALCIUM 7.3* 7.2*   PT/INR No results for input(s): LABPROT, INR in the last 72 hours. ABG No results for input(s): PHART, HCO3 in the last 72 hours.  Invalid input(s): PCO2, PO2  Studies/Results: Dg Chest Port 1 View  Result Date: 08/10/2016 CLINICAL DATA:  Respiratory failure EXAM: PORTABLE CHEST 1 VIEW COMPARISON:  08/09/2016 FINDINGS: Bilateral chest tubes remain in place. Small right apical pneumothorax is stable. No left pneumothorax. Right PICC line tip is in the SVC, stable. Stable cardiomegaly. Bilateral perihilar and lower lobe airspace opacities are again noted, not significantly changed. Small bilateral effusions. IMPRESSION: Bilateral perihilar and lower lobe airspace opacities. Small right apical  pneumothorax, stable. Electronically Signed   By: Rolm Baptise M.D.   On: 08/10/2016 09:03    Anti-infectives: Anti-infectives    Start     Dose/Rate Route Frequency Ordered Stop   08/05/16 1500  vancomycin (VANCOCIN) IVPB 1000 mg/200 mL premix  Status:  Discontinued     1,000 mg 200 mL/hr over 60 Minutes Intravenous Every 12 hours 08/05/16 0753 08/06/16 0850   08/05/16 0800  vancomycin (VANCOCIN) IVPB 1000 mg/200 mL premix     1,000 mg 200 mL/hr over 60 Minutes Intravenous  Once 08/05/16 0753 08/05/16 0923   07/30/16 1815  fluconazole (DIFLUCAN) IVPB 400 mg     400 mg 100 mL/hr over 120 Minutes Intravenous Every 24 hours 07/30/16 1743     07/30/16 1528  piperacillin-tazobactam (ZOSYN) 3.375 GM/50ML IVPB    Comments:  LEWIS, CINDY: cabinet override      07/30/16 1528 07/30/16 1525   07/21/16 0000  piperacillin-tazobactam (ZOSYN) IVPB 3.375 g     3.375 g 12.5 mL/hr over 240 Minutes Intravenous Every 8 hours 07/20/16 1809     07/20/16 1430  piperacillin-tazobactam (ZOSYN) IVPB 3.375 g     3.375 g 100 mL/hr over 30 Minutes Intravenous  Once 07/20/16 1424 07/20/16 1603      Assessment/Plan: s/p Procedure(s): CHEST TUBE INSERTION VIDEO ASSISTED THORACOSCOPY (VATS)/THOROCOTOMY Possible Thoracotomy DEBRIDEMENT OF ABDOMINAL WALL ABSCESS   White blood cell count remains elevated. Patient doing quite well. We'll review antibiotic therapy and consider changing antibiotics as she has been on Zosyn for at least 2 weeks now. Will return to  the operating room tomorrow for wound VAC change and possible drain removal. I reviewed her CT scans in detail. With her the rationale for the return to the operating room and the risk bleeding infection recurrence the need for additional drainage etc. she understood and agreed to proceed  Florene Glen, MD, FACS  08/11/2016

## 2016-08-12 ENCOUNTER — Encounter
Admission: EM | Disposition: A | Discharge status: Home Health | Payer: Self-pay | Source: Home / Self Care | Attending: Surgery

## 2016-08-12 ENCOUNTER — Encounter: Payer: Self-pay | Admitting: Anesthesiology

## 2016-08-12 ENCOUNTER — Inpatient Hospital Stay: Payer: BLUE CROSS/BLUE SHIELD | Admitting: Anesthesiology

## 2016-08-12 HISTORY — PX: APPLICATION OF WOUND VAC: SHX5189

## 2016-08-12 LAB — CBC WITH DIFFERENTIAL/PLATELET
BASOS ABS: 0.1 10*3/uL (ref 0–0.1)
Basophils Relative: 0 %
Eosinophils Absolute: 0.2 10*3/uL (ref 0–0.7)
Eosinophils Relative: 1 %
HEMATOCRIT: 24.4 % — AB (ref 35.0–47.0)
HEMOGLOBIN: 7.9 g/dL — AB (ref 12.0–16.0)
Lymphocytes Relative: 5 %
Lymphs Abs: 1.2 10*3/uL (ref 1.0–3.6)
MCH: 25.4 pg — ABNORMAL LOW (ref 26.0–34.0)
MCHC: 32.6 g/dL (ref 32.0–36.0)
MCV: 78 fL — ABNORMAL LOW (ref 80.0–100.0)
Monocytes Absolute: 1.4 10*3/uL — ABNORMAL HIGH (ref 0.2–0.9)
Monocytes Relative: 6 %
NEUTROS ABS: 21.3 10*3/uL — AB (ref 1.4–6.5)
NEUTROS PCT: 88 %
Platelets: 871 10*3/uL — ABNORMAL HIGH (ref 150–440)
RBC: 3.12 MIL/uL — ABNORMAL LOW (ref 3.80–5.20)
RDW: 24.7 % — AB (ref 11.5–14.5)
WBC: 24.2 10*3/uL — ABNORMAL HIGH (ref 3.6–11.0)

## 2016-08-12 LAB — BASIC METABOLIC PANEL
ANION GAP: 5 (ref 5–15)
BUN: 9 mg/dL (ref 6–20)
CO2: 27 mmol/L (ref 22–32)
Calcium: 7.3 mg/dL — ABNORMAL LOW (ref 8.9–10.3)
Chloride: 107 mmol/L (ref 101–111)
Creatinine, Ser: <0.3 mg/dL — ABNORMAL LOW (ref 0.44–1.00)
Glucose, Bld: 74 mg/dL (ref 65–99)
Potassium: 3.1 mmol/L — ABNORMAL LOW (ref 3.5–5.1)
SODIUM: 139 mmol/L (ref 135–145)

## 2016-08-12 LAB — GLUCOSE, CAPILLARY
GLUCOSE-CAPILLARY: 74 mg/dL (ref 65–99)
GLUCOSE-CAPILLARY: 75 mg/dL (ref 65–99)
GLUCOSE-CAPILLARY: 76 mg/dL (ref 65–99)

## 2016-08-12 SURGERY — APPLICATION, WOUND VAC
Anesthesia: General | Site: Abdomen | Wound class: Dirty or Infected

## 2016-08-12 MED ORDER — ROCURONIUM BROMIDE 50 MG/5ML IV SOLN
INTRAVENOUS | Status: AC
  Filled 2016-08-12: qty 1

## 2016-08-12 MED ORDER — FENTANYL CITRATE (PF) 250 MCG/5ML IJ SOLN
INTRAMUSCULAR | Status: AC
  Filled 2016-08-12: qty 5

## 2016-08-12 MED ORDER — ONDANSETRON HCL 4 MG/2ML IJ SOLN: 4.0000 mg | Freq: Once | INTRAMUSCULAR | Status: DC | PRN

## 2016-08-12 MED ORDER — MIDAZOLAM HCL 2 MG/2ML IJ SOLN
INTRAMUSCULAR | Status: DC | PRN
  Administered 2016-08-12: 2 mg via INTRAVENOUS

## 2016-08-12 MED ORDER — FENTANYL CITRATE (PF) 100 MCG/2ML IJ SOLN
INTRAMUSCULAR | Status: DC | PRN
  Administered 2016-08-12 (×2): 50 ug via INTRAVENOUS

## 2016-08-12 MED ORDER — LIDOCAINE HCL (CARDIAC) 20 MG/ML IV SOLN
INTRAVENOUS | Status: DC | PRN
  Administered 2016-08-12: 80 mg via INTRAVENOUS

## 2016-08-12 MED ORDER — LACTATED RINGERS IV SOLN
INTRAVENOUS | Status: DC | PRN
  Administered 2016-08-12: 08:00:00 via INTRAVENOUS

## 2016-08-12 MED ORDER — FENTANYL CITRATE (PF) 100 MCG/2ML IJ SOLN: 25.0000 ug | INTRAMUSCULAR | Status: DC | PRN

## 2016-08-12 MED ORDER — SUCCINYLCHOLINE CHLORIDE 20 MG/ML IJ SOLN
INTRAMUSCULAR | Status: DC | PRN
  Administered 2016-08-12: 100 mg via INTRAVENOUS

## 2016-08-12 MED ORDER — SUCCINYLCHOLINE CHLORIDE 20 MG/ML IJ SOLN
INTRAMUSCULAR | Status: AC
  Filled 2016-08-12: qty 1

## 2016-08-12 MED ORDER — ACETAMINOPHEN 500 MG PO TABS: 500.0000 mg | ORAL_TABLET | Freq: Four times a day (QID) | ORAL | Status: DC | PRN

## 2016-08-12 MED ORDER — ROCURONIUM BROMIDE 100 MG/10ML IV SOLN
INTRAVENOUS | Status: DC | PRN
  Administered 2016-08-12: 10 mg via INTRAVENOUS

## 2016-08-12 MED ORDER — PROPOFOL 10 MG/ML IV BOLUS
INTRAVENOUS | Status: AC
  Filled 2016-08-12: qty 20

## 2016-08-12 MED ORDER — DEXAMETHASONE SODIUM PHOSPHATE 10 MG/ML IJ SOLN
INTRAMUSCULAR | Status: DC | PRN
  Administered 2016-08-12: 10 mg via INTRAVENOUS

## 2016-08-12 MED ORDER — LIDOCAINE HCL (PF) 2 % IJ SOLN
INTRAMUSCULAR | Status: AC
  Filled 2016-08-12: qty 2

## 2016-08-12 MED ORDER — SUGAMMADEX SODIUM 200 MG/2ML IV SOLN
INTRAVENOUS | Status: AC
  Filled 2016-08-12: qty 2

## 2016-08-12 MED ORDER — PHENYLEPHRINE HCL 10 MG/ML IJ SOLN
INTRAMUSCULAR | Status: DC | PRN
  Administered 2016-08-12 (×4): 100 ug via INTRAVENOUS
  Administered 2016-08-12: 200 ug via INTRAVENOUS
  Administered 2016-08-12: 100 ug via INTRAVENOUS

## 2016-08-12 MED ORDER — MIDAZOLAM HCL 2 MG/2ML IJ SOLN
INTRAMUSCULAR | Status: AC
  Filled 2016-08-12: qty 2

## 2016-08-12 MED ORDER — SUGAMMADEX SODIUM 200 MG/2ML IV SOLN
INTRAVENOUS | Status: DC | PRN
  Administered 2016-08-12: 116.6 mg via INTRAVENOUS

## 2016-08-12 MED ORDER — PROPOFOL 10 MG/ML IV BOLUS
INTRAVENOUS | Status: DC | PRN
  Administered 2016-08-12: 100 mg via INTRAVENOUS

## 2016-08-12 SURGICAL SUPPLY — 14 items
CANISTER SUCT 1200ML W/VALVE (MISCELLANEOUS) ×3 IMPLANT
DRAIN PENROSE 1/4X12 LTX (DRAIN) ×3 IMPLANT
DRAPE LAPAROTOMY 100X77 ABD (DRAPES) ×3 IMPLANT
DRSG VAC ATS MED SENSATRAC (GAUZE/BANDAGES/DRESSINGS) ×3 IMPLANT
GAUZE SPONGE 4X4 12PLY STRL (GAUZE/BANDAGES/DRESSINGS) IMPLANT
GLOVE INDICATOR 7.5 STRL GRN (GLOVE) ×3 IMPLANT
GLOVE SURG LX 7.5 STRW (GLOVE) ×2
GLOVE SURG LX STRL 7.5 STRW (GLOVE) ×1 IMPLANT
GOWN STRL REUS W/ TWL LRG LVL3 (GOWN DISPOSABLE) ×2 IMPLANT
GOWN STRL REUS W/TWL LRG LVL3 (GOWN DISPOSABLE) ×4
KIT RM TURNOVER STRD PROC AR (KITS) ×3 IMPLANT
NS IRRIG 1000ML POUR BTL (IV SOLUTION) ×3 IMPLANT
PACK BASIN MINOR ARMC (MISCELLANEOUS) ×3 IMPLANT
WND VAC CANISTER 500ML (MISCELLANEOUS) IMPLANT

## 2016-08-12 NOTE — Transfer of Care (Signed)
Immediate Anesthesia Transfer of Care Note  Patient: Lindsey Wolf  Procedure(s) Performed: Procedure(s): ,remove 3 JP drains,add one pennrose (N/A)  Patient Location: PACU  Anesthesia Type:General  Level of Consciousness: sedated  Airway & Oxygen Therapy: Patient Spontanous Breathing and Patient connected to face mask oxygen  Post-op Assessment: Report given to RN and Post -op Vital signs reviewed and stable  Post vital signs: Reviewed and stable  Last Vitals:  Vitals:   08/11/16 2000 08/12/16 0424  BP: 114/68 112/68  Pulse: (!) 110 100  Resp: 19 16  Temp: 37.1 C 36.9 C    Last Pain:  Vitals:   08/12/16 0000  TempSrc:   PainSc: Asleep      Patients Stated Pain Goal: 2 (62/19/47 1252)  Complications: No apparent anesthesia complications

## 2016-08-12 NOTE — Progress Notes (Signed)
Preoperative Review   Patient is met in the preoperative holding area. The history is reviewed in the chart and with the patient. I personally reviewed the options and rationale as well as the risks of this procedure that have been previously discussed with the patient. All questions asked by the patient and/or family were answered to their satisfaction.  Patient agrees to proceed with this procedure at this time.  Blair Lundeen E Birdie Fetty M.D. FACS  

## 2016-08-12 NOTE — Op Note (Signed)
07/20/2016 - 08/12/2016  8:49 AM  PATIENT:  Lindsey Wolf  51 y.o. female  PRE-OPERATIVE DIAGNOSIS:  Wound sepsis  POST-OPERATIVE DIAGNOSIS:  Same  PROCEDURE: Wound VAC placement, debridement of abdominal wound, drainage of abscess  SURGEON:  Florene Glen MD, FACS   ANESTHESIA:   Gen. with endotracheal tube   Details of Procedure: This a patient with a history of a perforated ulcer multiple abdominal abscesses as well as empyema the lung. She requires wound VAC change for midline abdominal wound. Preoperatively we discussed rationale for surgery the options of observation and the need for further surgery she understood and agreed to proceed.  Findings necrotic fascia with granulation tissue present seropurulent material sharply debrided. Unattached PDS suture removed. Right lower quadrant abscess drained via midline wound. See Below  Patient was brought to the operating room and induced to general anesthesia and a surgical pause was performed. No draping was performed nor was prepping but sterile technique otherwise was observed.  The right upper JP drain and the left sided JP drain were removed without difficulty. The right lower quadrant drain which is posterior to the stomach was left in place.  Inspection of the midline wound was performed and granulation tissue was occurring at the edges there was extensive loose and unattached PDS suture which was cut and removed. The abdominal viscera were not easily visible utilized within this wound of granulation tissue and seropurulent exudate with necrotic fascia. Sharp dissection with scissors was performed to remove a small amount of necrotic fascia and minimal roughening of the tissue with a laparotomy pad was performed to enhance bleeding surface. This was also done to remove the minimal amount of seropurulent material present as well.  Further inspection of the wound demonstrated a pool of purulent material in the very lower or  caudad aspect of the midline wound. This was aspirated and cultured. Pressure on the right lateral side of the wound in the right lower quadrant allowed for further exudation of the purulent material inspection of this area could not identify anything but a pinhole and it was unclear if this was coming from intra-abdominal or subcutaneous probing of the wound was considered dangerous due to the risk of damage to bowel which could result in an enterocutaneous fistula therefore only minimal probing was performed. A Penrose drain was placed near the exit of this cavity in the subcutaneous tissue of the midline wound and held in with 3-0 nylon suture.  Wound VAC was then placed in a standard fashion over the entire midline wound and incorporating the Penrose drain. Adequate seal and suction was obtained.  Patient was awoken from general anesthesia and taken to the recovery room in stable condition with no complication and a sponge lap needle count which was correct. EBL was nil   Florene Glen, MD FACS

## 2016-08-12 NOTE — Progress Notes (Signed)
Physical Therapy Re-evaluation Patient Details Name: Lindsey Wolf MRN: [id number] DOB: 08-15-1965 Today's Date: 08/12/2016    History of Present Illness Pt is a 51 year old female status post complicated repair of peptic ulcer disease. She later underwent a repeat laparotomy for drainage of intra-abdominal abscesses. Pt diagnosed with Acute hypoxic respiratory failure with L chest tube placement, septic shock with hypotension, and A-fib with RVR.  Pt also has multiple JP drains and was intubated but is now extubated. Pt underwent L thoracotomy and thoracoscopy along with debridement of abdominal wall abscess on 08/08/16.  Pt now with B chest tubes, JP drains, and wound vac. Of note, small pneumothorax per chart review.  Patient underwent wound vac placement and debridement of abd wound 08/12/16.    PT Comments    Pt s/p abdominal I&D and wound vac placement 08/12/16 with continue upon transfer PT orders received.  Pt seen for PT re-eval.  No change to PT POC or frequency at this time.   Pt participated in gentle supine therex only this date secondary to soreness from procedures but no increase in pain noted during session.  Pt will benefit from PT services to address deficits in strength, gait, mobility, transfers, and activity tolerance for decreased caregiver assistance upon discharge.      Follow Up Recommendations        Equipment Recommendations       Recommendations for Other Services       Precautions / Restrictions Precautions Precautions: Fall Restrictions Weight Bearing Restrictions: No    Mobility  Bed Mobility               General bed mobility comments: Not tested this date secondary to patient underwent wound vac placement and debridement of abd wound this am  Transfers                 General transfer comment: Not tested this date  Ambulation/Gait             General Gait Details: Not tested   Stairs            Wheelchair  Mobility    Modified Rankin (Stroke Patients Only)       Balance                                    Cognition Arousal/Alertness: Awake/alert Behavior During Therapy: WFL for tasks assessed/performed Overall Cognitive Status: Within Functional Limits for tasks assessed                      Exercises Total Joint Exercises Ankle Circles/Pumps: Strengthening;Both;10 reps;15 reps Quad Sets: Strengthening;10 reps;15 reps;Both Gluteal Sets: Strengthening;Both;10 reps;15 reps Short Arc Quad: Strengthening;Both;10 reps;15 reps Heel Slides: AAROM;Both;Other (comment) (2 x 10) Hip ABduction/ADduction: AAROM;Both;Other (comment) (2 x 10) Straight Leg Raises: AAROM;Both;Other (comment) (2 x 10) Other Exercises Other Exercises: HEP edcuation/review for BLE APs, QS, and GS x 10 each  Other Exercises: Physiological benefits of activity education provided    General Comments        Pertinent Vitals/Pain Pain Assessment: 0-10 Pain Score: 6  Pain Location: L chest near chest tube placement Pain Descriptors / Indicators: Aching Pain Intervention(s): Premedicated before session;Monitored during session;Limited activity within patient's tolerance    Home Living                      Prior Function  PT Goals (current goals can now be found in the care plan section)      Frequency    7X/week      PT Plan Current plan remains appropriate    Co-evaluation             End of Session Equipment Utilized During Treatment: Oxygen Activity Tolerance: Patient limited by fatigue;No increased pain Patient left: in bed;with bed alarm set;with call bell/phone within reach         Time: 1452-1521 PT Time Calculation (min) (ACUTE ONLY): 29 min  Charges:  $Therapeutic Exercise: 23-37 mins                    G Codes:       DRoyetta Asal PT, DPT 08/12/16, 4:29 PM

## 2016-08-12 NOTE — Anesthesia Procedure Notes (Signed)
Procedure Name: Intubation Date/Time: 08/12/2016 8:30 AM Performed by: Nelda Marseille Pre-anesthesia Checklist: Patient identified, Patient being monitored, Timeout performed, Emergency Drugs available and Suction available Patient Re-evaluated:Patient Re-evaluated prior to inductionOxygen Delivery Method: Circle system utilized Preoxygenation: Pre-oxygenation with 100% oxygen Intubation Type: IV induction Ventilation: Mask ventilation without difficulty Laryngoscope Size: Mac and 3 Grade View: Grade I Tube type: Oral Tube size: 7.0 mm Number of attempts: 1 Airway Equipment and Method: Stylet Placement Confirmation: ETT inserted through vocal cords under direct vision,  positive ETCO2 and breath sounds checked- equal and bilateral Secured at: 21 cm Tube secured with: Tape Dental Injury: Teeth and Oropharynx as per pre-operative assessment

## 2016-08-12 NOTE — Progress Notes (Signed)
[facility] [place] NAME: Lindsey Wolf    MR#:  [id number]  DATE OF BIRTH:  1965-08-02  SUBJECTIVE:   patient underwent wound vac placement and debridement of abd wound this am/drainage of abscess.  REVIEW OF SYSTEMS:    Review of Systems  Constitutional: Negative for chills, fever and malaise/fatigue.  HENT: Negative.  Negative for ear discharge, ear pain, hearing loss, nosebleeds and sore throat.   Eyes: Negative.  Negative for blurred vision and pain.  Respiratory: Negative.  Negative for cough, hemoptysis, shortness of breath and wheezing.   Cardiovascular: Negative.  Negative for chest pain, palpitations and leg swelling.  Gastrointestinal: Positive for abdominal pain. Negative for blood in stool, diarrhea, nausea and vomiting.  Genitourinary: Negative.  Negative for dysuria.  Musculoskeletal: Negative for back pain.  Skin: Negative.   Neurological: Positive for weakness. Negative for dizziness, tremors, speech change, focal weakness, seizures and headaches.  Endo/Heme/Allergies: Negative.  Does not bruise/bleed easily.  Psychiatric/Behavioral: Negative.  Negative for depression, hallucinations and suicidal ideas.     Tolerating Diet: TPN clears      DRUG ALLERGIES:  No Known Allergies  VITALS:  Blood pressure (!) 104/58, pulse 89, temperature 98.3 F (36.8 C), temperature source Axillary, resp. rate (!) 24, height 5\' 10"  (1.778 m), weight 58.3 kg (128 lb 8 oz), SpO2 99 %.  PHYSICAL EXAMINATION:   Physical Exam  Constitutional: She is well-developed, well-nourished, and in no distress. No distress.  HENT:  Head: Normocephalic.  Eyes: No scleral icterus.  Neck: Normal range of motion. Neck supple. No JVD present. No tracheal deviation present.  Cardiovascular: Normal rate, regular rhythm and normal heart sounds.  Exam reveals no gallop and no friction rub.   No murmur heard. Pulmonary/Chest: Effort normal and breath  sounds normal. No respiratory distress. She has no wheezes. She has no rales. She exhibits no tenderness.  B/l chest tubes  Abdominal: She exhibits no distension and no mass. There is tenderness. There is no rebound and no guarding.  Penrose drain  Wound vac  Musculoskeletal: Normal range of motion. She exhibits no edema.  Neurological: She is alert.  Skin: Skin is warm. No rash noted. No erythema.  Psychiatric: Affect and judgment normal.      LABORATORY PANEL:   CBC  Recent Labs Lab 08/12/16 0631  WBC 24.2*  HGB 7.9*  HCT 24.4*  PLT 871*   ------------------------------------------------------------------------------------------------------------------  Chemistries   Recent Labs Lab 08/08/16 0607  08/11/16 0522 08/12/16 0631  NA 136  < > 135 139  K 2.5*  < > 3.3* 3.1*  CL 104  < > 103 107  CO2 23  < > 25 27  GLUCOSE 107*  < > 93 74  BUN 9  < > 11 9  CREATININE <0.30*  < > <0.30* <0.30*  CALCIUM 7.2*  < > 7.2* 7.3*  MG 1.7  < > 1.7  --   AST 27  --   --   --   ALT 18  --   --   --   ALKPHOS 202*  --   --   --   BILITOT 0.5  --   --   --   < > = values in this interval not displayed. ------------------------------------------------------------------------------------------------------------------  Cardiac Enzymes No results for input(s): TROPONINI in the last 168 hours. ------------------------------------------------------------------------------------------------------------------  RADIOLOGY:  No results found.   ASSESSMENT AND PLAN:    51 year old female admitted with the perforation  peptic ulcer on fingers 16th and underwent emergent surgery with findings of purulent peritonitis who has had a complicated course.  1. Perforated peptic ulcer with complicated course since admission: POD #4 left thoracoscopy and thoracotomy for empyema  POD #0 for debridement/drainage of absess Continue Zosyn and continue fluconazole for now but if cxs remain negative  for fungal growth then can dc Fluconazole on Monday as per ID consult Wound culture from 3/5 and 3/7 e coli pansensitve,  WBC decreasing  2. Hypokalemia: replete as per pharmacy consult and check BMP in am  3. Acute blood loss anemia: Patient transfused 3/6 Hemoglobin 7.9 this morning and stable Would transfuse if hemoglobin less than 7.5  4. Severe sepsis on admission: Resolved  5. PAF: Resolved  6. Hyponatremia: Sodium 139 this a.m.  7. Anxiety; Ativan PRn    CODE STATUS: FULL  TOTAL TIME TAKING CARE OF THIS PATIENT: 21 minutes.   Physical therapy is recommending skilled nursing facility at discharge  POSSIBLE D/C ??, DEPENDING ON CLINICAL CONDITION.   Lindsey Wolf M.D on 08/12/2016 at 10:41 AM  Between 7am to 6pm - Pager - [phone] After 6pm go to [email or web address] - password [email or web address] [facility]  Office  [phone]  CC: Primary care physician; No PCP Per Patient  Note: This dictation was prepared with Dragon dictation along with smaller phrase technology. Any transcriptional errors that result from this process are unintentional.

## 2016-08-12 NOTE — Anesthesia Post-op Follow-up Note (Cosign Needed)
Anesthesia QCDR form completed.        

## 2016-08-12 NOTE — Anesthesia Preprocedure Evaluation (Addendum)
Anesthesia Evaluation  Patient identified by MRN, date of birth, ID band Patient awake    Reviewed: Allergy & Precautions, NPO status , Patient's Chart, lab work & pertinent test results, reviewed documented beta blocker date and time   Airway Mallampati: II  TM Distance: >3 FB     Dental  (+) Chipped   Pulmonary Current Smoker,           Cardiovascular      Neuro/Psych    GI/Hepatic PUD,   Endo/Other    Renal/GU      Musculoskeletal   Abdominal   Peds  Hematology  (+) anemia ,   Anesthesia Other Findings Pt well known to me. Hx of perforated peptic ulcer with a rocky few days. Doing much better now. Still severely anemic, s/p tranfusion.  Reproductive/Obstetrics                            Anesthesia Physical Anesthesia Plan  ASA: III  Anesthesia Plan: General   Post-op Pain Management:    Induction: Intravenous  Airway Management Planned:   Additional Equipment:   Intra-op Plan:   Post-operative Plan:   Informed Consent: I have reviewed the patients History and Physical, chart, labs and discussed the procedure including the risks, benefits and alternatives for the proposed anesthesia with the patient or authorized representative who has indicated his/her understanding and acceptance.     Plan Discussed with: CRNA  Anesthesia Plan Comments:         Anesthesia Quick Evaluation

## 2016-08-12 NOTE — Anesthesia Postprocedure Evaluation (Signed)
Anesthesia Post Note  Patient: Lindsey Wolf  Procedure(s) Performed: Procedure(s) (LRB): ,remove 2  JP drains,add one pennrose (N/A)  Patient location during evaluation: PACU Anesthesia Type: General Level of consciousness: awake and alert Pain management: pain level controlled Vital Signs Assessment: post-procedure vital signs reviewed and stable Respiratory status: spontaneous breathing, nonlabored ventilation, respiratory function stable and patient connected to nasal cannula oxygen Cardiovascular status: blood pressure returned to baseline and stable Postop Assessment: no signs of nausea or vomiting Anesthetic complications: no     Last Vitals:  Vitals:   08/12/16 1033 08/12/16 1233  BP: (!) 104/58 102/61  Pulse: 89 88  Resp: (!) 24 (!) 24  Temp: 36.8 C 36.8 C    Last Pain:  Vitals:   08/12/16 1233  TempSrc: Oral  PainSc:                  [name]

## 2016-08-13 ENCOUNTER — Encounter: Payer: Self-pay | Admitting: Surgery

## 2016-08-13 LAB — AEROBIC/ANAEROBIC CULTURE W GRAM STAIN (SURGICAL/DEEP WOUND): Culture: NO GROWTH

## 2016-08-13 LAB — CBC WITH DIFFERENTIAL/PLATELET
BASOS PCT: 0 %
Basophils Absolute: 0 10*3/uL (ref 0–0.1)
EOS ABS: 0 10*3/uL (ref 0–0.7)
EOS PCT: 0 %
HCT: 24.2 % — ABNORMAL LOW (ref 35.0–47.0)
HEMOGLOBIN: 7.9 g/dL — AB (ref 12.0–16.0)
Lymphocytes Relative: 6 %
Lymphs Abs: 1 10*3/uL (ref 1.0–3.6)
MCH: 25.4 pg — AB (ref 26.0–34.0)
MCHC: 32.4 g/dL (ref 32.0–36.0)
MCV: 78.3 fL — ABNORMAL LOW (ref 80.0–100.0)
Monocytes Absolute: 1 10*3/uL — ABNORMAL HIGH (ref 0.2–0.9)
Monocytes Relative: 6 %
NEUTROS PCT: 88 %
Neutro Abs: 15.1 10*3/uL — ABNORMAL HIGH (ref 1.4–6.5)
PLATELETS: 950 10*3/uL — AB (ref 150–440)
RBC: 3.1 MIL/uL — AB (ref 3.80–5.20)
RDW: 24.7 % — ABNORMAL HIGH (ref 11.5–14.5)
WBC: 17.1 10*3/uL — AB (ref 3.6–11.0)

## 2016-08-13 LAB — AEROBIC/ANAEROBIC CULTURE (SURGICAL/DEEP WOUND)

## 2016-08-13 LAB — BASIC METABOLIC PANEL
Anion gap: 3 — ABNORMAL LOW (ref 5–15)
BUN: 16 mg/dL (ref 6–20)
CHLORIDE: 104 mmol/L (ref 101–111)
CO2: 28 mmol/L (ref 22–32)
Calcium: 7.7 mg/dL — ABNORMAL LOW (ref 8.9–10.3)
Glucose, Bld: 109 mg/dL — ABNORMAL HIGH (ref 65–99)
POTASSIUM: 3.5 mmol/L (ref 3.5–5.1)
SODIUM: 135 mmol/L (ref 135–145)

## 2016-08-13 LAB — MAGNESIUM: MAGNESIUM: 1.9 mg/dL (ref 1.7–2.4)

## 2016-08-13 MED ORDER — HYDROMORPHONE HCL 1 MG/ML IJ SOLN
0.5000 mg | INTRAMUSCULAR | Status: DC | PRN
  Administered 2016-08-13 – 2016-09-14 (×69): 0.5 mg via INTRAVENOUS
  Filled 2016-08-13 (×70): qty 0.5

## 2016-08-13 MED ORDER — OXYCODONE HCL 5 MG PO TABS
5.0000 mg | ORAL_TABLET | ORAL | Status: DC | PRN
  Administered 2016-08-13 – 2016-08-15 (×6): 10 mg via ORAL
  Administered 2016-08-16: 5 mg via ORAL
  Administered 2016-08-17 – 2016-08-23 (×12): 10 mg via ORAL
  Administered 2016-08-25: 5 mg via ORAL
  Administered 2016-08-26: 10 mg via ORAL
  Administered 2016-08-26: 5 mg via ORAL
  Administered 2016-08-27 (×2): 10 mg via ORAL
  Administered 2016-08-27 – 2016-08-28 (×3): 5 mg via ORAL
  Administered 2016-08-28 – 2016-08-30 (×6): 10 mg via ORAL
  Filled 2016-08-13 (×13): qty 2
  Filled 2016-08-13: qty 1
  Filled 2016-08-13: qty 2
  Filled 2016-08-13: qty 1
  Filled 2016-08-13: qty 2
  Filled 2016-08-13: qty 1
  Filled 2016-08-13 (×4): qty 2
  Filled 2016-08-13: qty 1
  Filled 2016-08-13 (×7): qty 2
  Filled 2016-08-13: qty 1
  Filled 2016-08-13 (×3): qty 2
  Filled 2016-08-13 (×2): qty 1
  Filled 2016-08-13: qty 2

## 2016-08-13 MED ORDER — FLUOXETINE HCL 20 MG PO CAPS
40.0000 mg | ORAL_CAPSULE | Freq: Every morning | ORAL | Status: DC
  Administered 2016-08-14 – 2016-09-14 (×32): 40 mg via ORAL
  Filled 2016-08-13 (×32): qty 2

## 2016-08-13 MED ORDER — ACETAMINOPHEN 500 MG PO TABS
1000.0000 mg | ORAL_TABLET | Freq: Four times a day (QID) | ORAL | Status: DC | PRN
  Administered 2016-09-03 – 2016-09-07 (×3): 1000 mg via ORAL
  Filled 2016-08-13 (×3): qty 2

## 2016-08-13 NOTE — Progress Notes (Signed)
Police from [place] came to the hospital and notified patient that patient mother passed today. Patients ex-husband called and stated that patient mother died today also Dr. Hampton Abbot notified that patient lost her mother, no new orders recieved

## 2016-08-13 NOTE — NC FL2 (Signed)
[place] LEVEL OF CARE SCREENING TOOL     IDENTIFICATION  Patient Name: Lindsey Wolf Birthdate: April 01, 1966 Sex: female Admission Date (Current Location): 07/20/2016  [facility] and [place] Number:  Engineering geologist and Address:  [facility], [address], [place], [place] [address]      Provider Number: [id number]  Attending Physician Name and Address:  Olean Ree, MD  Relative Name and Phone Number:       Current Level of Care: Hospital Recommended Level of Care: [place] Prior Approval Number:    Date Approved/Denied:   PASRR Number:    Discharge Plan: SNF    Current Diagnoses: Patient Active Problem List   Diagnosis Date Noted  . Empyema of left pleural space ([place])   . Abdominal abscess ([place])   . Abnormal respirations   . Pleural effusion   . Respiratory distress   . Perforated ulcer ([place])     Orientation RESPIRATION BLADDER Height & Weight     Self, Place, Situation  Normal, O2 (2 liters) Continent Weight: 131 lb 4.8 oz (59.6 kg) Height:  5\' 10"  (177.8 cm)  BEHAVIORAL SYMPTOMS/MOOD NEUROLOGICAL BOWEL NUTRITION STATUS   (none)  (none) Continent Diet (soft)  AMBULATORY STATUS COMMUNICATION OF NEEDS Skin   Extensive Assist Verbally PU Stage and Appropriate Care, Surgical wounds                       Personal Care Assistance Level of Assistance  Bathing, Dressing Bathing Assistance: Maximum assistance   Dressing Assistance: Maximum assistance     Functional Limitations Info   (no issues)          SPECIAL CARE FACTORS FREQUENCY  PT (By licensed PT) (wound vac and 2 chest tubes)                    Contractures Contractures Info: Not present    Additional Factors Info  Code Status, Allergies Code Status Info: full Allergies Info: nka           Current Medications (08/13/2016):  This is the current hospital active medication list Current Facility-Administered  Medications  Medication Dose Route Frequency Provider Last Rate Last Dose  . acetaminophen (TYLENOL) tablet 1,000 mg  1,000 mg Oral Q6H PRN Olean Ree, MD      . enoxaparin (LOVENOX) injection 40 mg  40 mg Subcutaneous Q24H Jules Husbands, MD   40 mg at 08/12/16 2153  . ertapenem (INVANZ) 1 g in sodium chloride 0.9 % 50 mL IVPB  1 g Intravenous Q24H Florene Glen, MD   1 g at 08/13/16 1000  . feeding supplement (ENSURE ENLIVE) (ENSURE ENLIVE) liquid 237 mL  237 mL Oral TID BM Sital Mody, MD   237 mL at 08/11/16 1413  . HYDROmorphone (DILAUDID) injection 0.5 mg  0.5 mg Intravenous Q4H PRN Olean Ree, MD   0.5 mg at 08/13/16 1223  . ipratropium-albuterol (DUONEB) 0.5-2.5 (3) MG/3ML nebulizer solution 3 mL  3 mL Nebulization Q4H PRN Bettey Costa, MD   3 mL at 08/13/16 1149  . LORazepam (ATIVAN) injection 0.5-1 mg  0.5-1 mg Intravenous Q4H PRN Wilhelmina Mcardle, MD   1 mg at 08/13/16 1233  . MEDLINE mouth rinse  15 mL Mouth Rinse BID Bettey Costa, MD   15 mL at 08/13/16 1240  . metoprolol (LOPRESSOR) injection 2.5-5 mg  2.5-5 mg Intravenous Q3H PRN Wilhelmina Mcardle, MD      .  ondansetron (ZOFRAN-ODT) disintegrating tablet 4 mg  4 mg Oral Q6H PRN Jules Husbands, MD   4 mg at 08/13/16 0858   Or  . ondansetron (ZOFRAN) injection 4 mg  4 mg Intravenous Q6H PRN Jules Husbands, MD   4 mg at 08/12/16 0827  . oxyCODONE (Oxy IR/ROXICODONE) immediate release tablet 5-10 mg  5-10 mg Oral Q4H PRN Olean Ree, MD      . pantoprazole (PROTONIX) injection 40 mg  40 mg Intravenous Q12H Wilhelmina Mcardle, MD   40 mg at 08/13/16 1243  . phenol (CHLORASEPTIC) mouth spray 1 spray  1 spray Mouth/Throat PRN Laverle Hobby, MD   1 spray at 08/02/16 1102  . sodium chloride flush (NS) 0.9 % injection 10-40 mL  10-40 mL Intracatheter Q12H Flora Lipps, MD   10 mL at 08/13/16 1246  . sodium chloride flush (NS) 0.9 % injection 10-40 mL  10-40 mL Intracatheter PRN Flora Lipps, MD   10 mL at 08/04/16 0116     Discharge  Medications: Please see discharge summary for a list of discharge medications.  Relevant Imaging Results:  Relevant Lab Results:   Additional Information Will need wound vac and has two chest tubes  Shela Leff, LCSW

## 2016-08-13 NOTE — Progress Notes (Signed)
Chaplain was paged for patient in room 210. [place] police office was going to deliver a news that the patient's mother was found dead in her home. Pt. Was visibly emotional and she cries as she was listening to the officer. Chaplain was by patient's side. Pt. Was not interested in prayer but Chaplain offered emotional support.

## 2016-08-13 NOTE — Progress Notes (Signed)
Physical Therapy Treatment Patient Details Name: Lindsey Wolf MRN: [id number] DOB: 1966-05-26 Today's Date: 08/13/2016    History of Present Illness Pt is a 51 year old female status post complicated repair of peptic ulcer disease. She later underwent a repeat laparotomy for drainage of intra-abdominal abscesses. Pt diagnosed with Acute hypoxic respiratory failure with L chest tube placement, septic shock with hypotension, and A-fib with RVR.  Pt also has multiple JP drains and was intubated but is now extubated. Pt underwent L thoracotomy and thoracoscopy along with debridement of abdominal wall abscess on 08/08/16.  Pt now with B chest tubes, JP drains, and wound vac. Of note, small pneumothorax per chart review.  Patient underwent wound vac placement and debridement of abd wound 08/12/16.    PT Comments    Pt requesting to use bedpan.  Encouraged bedside commode and agreed to try.  Bed mobility significantly improved today with min a +1 for assist and +1 to manage drains/tubes/lines.  She was able to stand with verbal cues for safety and transfer to/from bedside commode with min a x 1.  She was able to stand for care without difficulty.  Overall significant improvement in activity tolerance and mobility.  Exercises were deferred this treatment as she was requesting a breathing treatment and asked to return later.  Will return as time allows today.  Bilateral foot drop noted.  Feet positioned with blankets and encouraged dorsiflexion exercises to strengthen muscles.  Pt agreed.    Follow Up Recommendations  CIR     Equipment Recommendations  None recommended by PT    Recommendations for Other Services       Precautions / Restrictions Precautions Precautions: Fall Restrictions Weight Bearing Restrictions: No    Mobility  Bed Mobility Overal bed mobility: Needs Assistance Bed Mobility: Supine to Sit;Sit to Supine     Supine to sit: Min assist;+2 for safety/equipment Sit to  supine: Min assist;+2 for safety/equipment   General bed mobility comments: +2 asssit for tubes/lines/drains  Transfers Overall transfer level: Needs assistance Equipment used: None Transfers: Stand Pivot Transfers   Stand pivot transfers: Min assist;+2 safety/equipment          Ambulation/Gait                 Stairs            Wheelchair Mobility    Modified Rankin (Stroke Patients Only)       Balance Overall balance assessment: Needs assistance Sitting-balance support: Single extremity supported;Feet supported Sitting balance-Leahy Scale: Fair     Standing balance support: Bilateral upper extremity supported Standing balance-Leahy Scale: Fair                      Cognition Arousal/Alertness: Awake/alert Behavior During Therapy: WFL for tasks assessed/performed Overall Cognitive Status: Within Functional Limits for tasks assessed                      Exercises Other Exercises Other Exercises: commode transfers    General Comments        Pertinent Vitals/Pain Pain Assessment: 0-10 Pain Score: 6  Pain Location: L chest near chest tube placement Pain Descriptors / Indicators: Aching Pain Intervention(s): Limited activity within patient's tolerance;Monitored during session    Home Living                      Prior Function            PT Goals (  current goals can now be found in the care plan section) Progress towards PT goals: Progressing toward goals    Frequency    7X/week      PT Plan Current plan remains appropriate    Co-evaluation             End of Session Equipment Utilized During Treatment: Oxygen Activity Tolerance: Patient limited by fatigue Patient left: in bed;with bed alarm set;with call bell/phone within reach Nurse Communication: Other (comment)       Time: 1100-1116 PT Time Calculation (min) (ACUTE ONLY): 16 min  Charges:  $Therapeutic Activity: 8-22 mins                     G Codes:       Chesley Noon Aug 15, 2016, 11:23 AM

## 2016-08-13 NOTE — Progress Notes (Signed)
[place] [facility] [place] NAME: Lindsey Wolf    MR#:  [id number]  DATE OF BIRTH:  Oct 17, 1965  SUBJECTIVE:    No acute events overnight. No abdominal pain. No N/V. B/l CT still in place.  Patient is status post exploratory laparotomy for perforated peptic ulcer and also left-sided thoracoscopy for empyema.  REVIEW OF SYSTEMS:    Review of Systems  Constitutional: Negative for chills, fever and malaise/fatigue.  HENT: Negative.  Negative for ear discharge, ear pain, hearing loss, nosebleeds and sore throat.   Eyes: Negative.  Negative for blurred vision and pain.  Respiratory: Negative.  Negative for cough, hemoptysis, shortness of breath and wheezing.   Cardiovascular: Negative.  Negative for chest pain, palpitations and leg swelling.  Gastrointestinal: Positive for abdominal pain. Negative for blood in stool, diarrhea, nausea and vomiting.  Genitourinary: Negative.  Negative for dysuria.  Musculoskeletal: Negative for back pain.  Skin: Negative.   Neurological: Positive for weakness. Negative for dizziness, tremors, speech change, focal weakness, seizures and headaches.  Endo/Heme/Allergies: Negative.  Does not bruise/bleed easily.  Psychiatric/Behavioral: Negative.  Negative for depression, hallucinations and suicidal ideas.   Tolerating Diet: Soft diet Yes   DRUG ALLERGIES:  No Known Allergies  VITALS:  Blood pressure 107/66, pulse 77, temperature 98.2 F (36.8 C), temperature source Oral, resp. rate 20, height 5\' 10"  (1.778 m), weight 59.6 kg (131 lb 4.8 oz), SpO2 100 %.  PHYSICAL EXAMINATION:   Physical Exam  Constitutional: She is well-developed, well-nourished, and in no distress. No distress.  HENT:  Head: Normocephalic.  Eyes: No scleral icterus.  Neck: Normal range of motion. Neck supple. No JVD present. No tracheal deviation present.  Cardiovascular: Normal rate, regular rhythm and normal heart sounds.  Exam reveals no  gallop and no friction rub.   No murmur heard. Pulmonary/Chest: Effort normal and breath sounds normal. No respiratory distress. She has no wheezes. She has no rales. She exhibits no tenderness.  B/l chest tubes  Abdominal: She exhibits no distension and no mass. There is tenderness. There is no rebound and no guarding.  Penrose drain  Wound vac  Musculoskeletal: Normal range of motion. She exhibits no edema.  Neurological: She is alert.  Skin: Skin is warm. No rash noted. No erythema.  Psychiatric: Affect and judgment normal.      LABORATORY PANEL:   CBC  Recent Labs Lab 08/13/16 0357  WBC 17.1*  HGB 7.9*  HCT 24.2*  PLT 950*   ------------------------------------------------------------------------------------------------------------------  Chemistries   Recent Labs Lab 08/08/16 0607  08/13/16 0802  NA 136  < > 135  K 2.5*  < > 3.5  CL 104  < > 104  CO2 23  < > 28  GLUCOSE 107*  < > 109*  BUN 9  < > 16  CREATININE <0.30*  < > <0.30*  CALCIUM 7.2*  < > 7.7*  MG 1.7  < > 1.9  AST 27  --   --   ALT 18  --   --   ALKPHOS 202*  --   --   BILITOT 0.5  --   --   < > = values in this interval not displayed. ------------------------------------------------------------------------------------------------------------------  Cardiac Enzymes No results for input(s): TROPONINI in the last 168 hours. ------------------------------------------------------------------------------------------------------------------  RADIOLOGY:  No results found.   ASSESSMENT AND PLAN:    51 year old female admitted with the perforation peptic ulcer and s/p 2 territory laparotomies, and also left-sided thoracoscopy for empyema.  1. Perforated peptic ulcer with complicated course since admission: - POD #5 left thoracoscopy and thoracotomy for empyema  - POD #1 for debridement/drainage of abscess of an abdominal wound.  - cont. Ertapenem and d/c Fluconazole as cultures are (-) so far.   - Wound culture from 3/5 and 3/7 e coli pansensitve,  - WBC decreasing and cont. Further care as per Gen. Surgery.   2. Hypokalemia: improved w/ supplementation and will cont. To monitor.   3. Acute blood loss anemia: Patient transfused 3/6 - Hemoglobin 7.9 this morning and stable no further need for transfusion.  Would transfuse if hemoglobin less than 7.   4. Severe sepsis on admission: Resolved  5. PAF: Resolved  6. Hyponatremia: Sodium 135 today and resolved.   7. Anxiety - Ativan PRn  Will transfer to surgical service and discussed w/ Dr. Hampton Abbot.   CODE STATUS: FULL  TOTAL TIME TAKING CARE OF THIS PATIENT: 25 minutes.   Physical therapy is recommending skilled nursing facility at discharge  POSSIBLE D/C ??, DEPENDING ON CLINICAL CONDITION.   Henreitta Leber M.D on 08/13/2016 at 1:49 PM  Between 7am to 6pm - Pager - [phone]  After 6pm go to [email or web address] - password [email or web address] [facility]  Office  [phone]  CC: Primary care physician; No PCP Per Patient  Note: This dictation was prepared with Dragon dictation along with smaller phrase technology. Any transcriptional errors that result from this process are unintentional.

## 2016-08-13 NOTE — Clinical Social Work Note (Signed)
PT is recommending [facility]. RN CM is going to look into this to see if this would be a viable option. The consideration for LTAC is also possible. In the meantime, CSW will conduct a local SNF bed search in the event patient requires this level of care. Shela Leff MSW,LCSW [phone]

## 2016-08-13 NOTE — Progress Notes (Signed)
Pt. Transferred to Surgical services.  Discussed w/ Dr. Hampton Abbot.    I will cont. To follow as a Optometrist.

## 2016-08-13 NOTE — Progress Notes (Signed)
08/13/2016  Subjective: Patient is s/p two exploratory laparotomies for perforated peptic ulcers and thoracoscopy for left empyema.  No acute events overnight.  Patient reports that her pain is well controlled and is tolerating her soft diet.  Reports some discomfort in the abdomen and in the bilateral chest tube sites, but controlled with pain meds.    Vital signs: Temp:  [98.2 F (36.8 C)-98.5 F (36.9 C)] 98.2 F (36.8 C) (03/12 0800) Pulse Rate:  [50-98] 77 (03/12 0800) Resp:  [17-24] 20 (03/12 0800) BP: (102-114)/(54-66) 107/66 (03/12 0800) SpO2:  [95 %-100 %] 100 % (03/12 0800) Weight:  [59.6 kg (131 lb 4.8 oz)] 59.6 kg (131 lb 4.8 oz) (03/12 0407)   Intake/Output: 03/11 0701 - 03/12 0700 In: 545 [P.O.:120; I.V.:425] Out: 4920 [Urine:1550; Blood:5; Chest Tube:265] Last BM Date: 08/11/16  Physical Exam: Constitutional:  No acute distress Cardiac: Regular rhythm and rate Pulm:  Bilateral chest tubes in place to suction, with no air leak in the system on either side.  Serosanguinous fluid in pleuravacs.  Abdomen:  Midline incision is clean with wound vac in place to suction with no leak.  No cellulitis or evidence of infection.  JP drain in place with seropurulent fluid, low volume.  Labs:   Recent Labs  08/12/16 0631 08/13/16 0357  WBC 24.2* 17.1*  HGB 7.9* 7.9*  HCT 24.4* 24.2*  PLT 871* 950*    Recent Labs  08/12/16 0631 08/13/16 0802  NA 139 135  K 3.1* 3.5  CL 107 104  CO2 27 28  GLUCOSE 74 109*  BUN 9 16  CREATININE <0.30* <0.30*  CALCIUM 7.3* 7.7*   No results for input(s): LABPROT, INR in the last 72 hours.  Imaging: No results found.  Assessment/Plan: 51 yo female s/p exploratory laparotomy x 2 and L VATS for empyema.  --Transition to oral pain medication with oxycodone and tylenol.  Dilaudid IV for breakthrough. --Continue chest tubes to suction.  May place to waterseal for ambulation.  Continue encouraging incentive spirometry and wean O2 as  tolerated.  Dr. Genevive Bi following. --Continue soft diet and Ensure supplement. --Continue wound vac dressing.  Change on Tuesday at bedside. --Continue JP drain -- this remaining drain is posterior to the stomach --Ambulate 3-4 times daily with assistance.  PT involved --f/u ID recs re antibiotic / antifungal management.   Melvyn Neth, [place]

## 2016-08-14 ENCOUNTER — Inpatient Hospital Stay: Payer: BLUE CROSS/BLUE SHIELD

## 2016-08-14 LAB — CBC WITH DIFFERENTIAL/PLATELET
Basophils Absolute: 0 10*3/uL (ref 0–0.1)
Basophils Relative: 0 %
Eosinophils Absolute: 0.1 10*3/uL (ref 0–0.7)
Eosinophils Relative: 1 %
HEMATOCRIT: 22.4 % — AB (ref 35.0–47.0)
Hemoglobin: 7.8 g/dL — ABNORMAL LOW (ref 12.0–16.0)
LYMPHS ABS: 1.5 10*3/uL (ref 1.0–3.6)
LYMPHS PCT: 9 %
MCH: 27.6 pg (ref 26.0–34.0)
MCHC: 35 g/dL (ref 32.0–36.0)
MCV: 79 fL — AB (ref 80.0–100.0)
MONO ABS: 1.5 10*3/uL — AB (ref 0.2–0.9)
MONOS PCT: 9 %
NEUTROS ABS: 13.6 10*3/uL — AB (ref 1.4–6.5)
Neutrophils Relative %: 81 %
Platelets: 837 10*3/uL — ABNORMAL HIGH (ref 150–440)
RBC: 2.83 MIL/uL — ABNORMAL LOW (ref 3.80–5.20)
RDW: 24.3 % — AB (ref 11.5–14.5)
WBC: 16.7 10*3/uL — ABNORMAL HIGH (ref 3.6–11.0)

## 2016-08-14 NOTE — Clinical Social Work Note (Signed)
CSW spoke with patient and extended bed offers. Patient chose [facility]. Will facilitate discharge to [facility] when time. Shela Leff MSW,LCSW [phone]

## 2016-08-14 NOTE — Progress Notes (Signed)
Physical Therapy Treatment Patient Details Name: Lindsey Wolf MRN: [id number] DOB: 1966-05-01 Today's Date: 08/14/2016    History of Present Illness Pt is a 51 year old female status post complicated repair of peptic ulcer disease. She later underwent a repeat laparotomy for drainage of intra-abdominal abscesses. Pt diagnosed with Acute hypoxic respiratory failure with L chest tube placement, septic shock with hypotension, and A-fib with RVR.  Pt also has multiple JP drains and was intubated but is now extubated. Pt underwent L thoracotomy and thoracoscopy along with debridement of abdominal wall abscess on 08/08/16.  Pt now with B chest tubes, JP drains, and wound vac. Of note, small pneumothorax per chart review.  Patient underwent wound vac placement and debridement of abd wound 08/12/16.    PT Comments    Pt is making good progress towards goals with improved tolerance for OOB mobility. Still requires +2 for all OOB mobility secondary to weakness/safety/lines/leads. Needs RW for balance/stability. Good endurance with there-ex, however still requires rest breaks for all exertion. Pt is motivated to work with therapy. WIll plan to continue to progress.   Follow Up Recommendations  SNF     Equipment Recommendations  Rolling walker with 5" wheels    Recommendations for Other Services       Precautions / Restrictions Precautions Precautions: Fall Restrictions Weight Bearing Restrictions: No    Mobility  Bed Mobility Overal bed mobility: Needs Assistance Bed Mobility: Supine to Sit     Supine to sit: Min assist;+2 for safety/equipment     General bed mobility comments: +2 for lines/tubes. Assist for trunk posture. Once seated at EOB, pt able to sit with safe technique. SLight discomfort with change in position.  Transfers Overall transfer level: Needs assistance Equipment used: Rolling walker (2 wheeled) Transfers: Sit to/from Stand Sit to Stand: Min assist;Mod assist;+2  physical assistance;+2 safety/equipment         General transfer comment: assist for standing using RW. Once standing, pt able to stand with upright posture with heavy WBing on RW. +2 for assist. All mobility performed on room air with sats WNL  Ambulation/Gait Ambulation/Gait assistance: Min assist;+2 physical assistance Ambulation Distance (Feet): 3 Feet Assistive device: Rolling walker (2 wheeled) Gait Pattern/deviations: Step-to pattern     General Gait Details: Short steps towards recliner with min assist +2 for equipment. Pt fatigues with exertion.   Stairs            Wheelchair Mobility    Modified Rankin (Stroke Patients Only)       Balance                                    Cognition Arousal/Alertness: Awake/alert Behavior During Therapy: WFL for tasks assessed/performed Overall Cognitive Status: Within Functional Limits for tasks assessed                      Exercises Other Exercises Other Exercises: Seated ther-ex performed including B LE SLR, hip abd/add, quad sets, and ankle pumps. ALl ther-ex performed x 15 reps with rest breaks required. Safe technique performed    General Comments        Pertinent Vitals/Pain Pain Assessment: No/denies pain Pain Score: 7  Pain Location: surgical site, and tubes. Pain Descriptors / Indicators: Aching Pain Intervention(s): Monitored during session;Limited activity within patient's tolerance    Home Living Family/patient expects to be discharged to:: Private residence Living Arrangements: Alone;Children (Son  may live with her.) Available Help at Discharge: Family Type of Home: Mobile home Home Access: Ramped entrance (7 steps)   Home Layout: One level Home Equipment: Walker - 2 wheels;Walker - 4 wheels;Cane - quad;Wheelchair - manual;Tub bench      Prior Function Level of Independence: Independent          PT Goals (current goals can now be found in the care plan section)  Acute Rehab PT Goals Patient Stated Goal: To return home, and to be able to pay her bills. PT Goal Formulation: With patient Time For Goal Achievement: 08/23/16 Potential to Achieve Goals: Good Progress towards PT goals: Progressing toward goals    Frequency    Min 2X/week      PT Plan Frequency needs to be updated;Discharge plan needs to be updated    Co-evaluation             End of Session   Activity Tolerance: Patient limited by fatigue Patient left: in chair;with chair alarm set Nurse Communication: Mobility status PT Visit Diagnosis: Muscle weakness (generalized) (M62.81);Pain;Difficulty in walking, not elsewhere classified (R26.2)     Time: 5883-2549 PT Time Calculation (min) (ACUTE ONLY): 27 min  Charges:  $Gait Training: 8-22 mins $Therapeutic Exercise: 8-22 mins                    G Codes:       Lindsey Wolf 2016/09/09, 5:09 PM  Greggory Stallion, PT, DPT [phone]

## 2016-08-14 NOTE — Progress Notes (Signed)
OT Cancellation Note  Patient Details Name: Lindsey Wolf MRN: [id number] DOB: Sep 09, 1965   Cancelled Treatment:    Reason Eval/Treat Not Completed: Patient at procedure or test/ unavailable;Other (comment). Order received, chart reviewed. Per PT/Nsg, pt currently with MD for wound vac change. Will re-attempt OT evaluation at later time when pt is available.  Jeni Salles, MPH, MS, OTR/L ascom [phone] 08/14/16, 9:47 AM

## 2016-08-14 NOTE — Progress Notes (Signed)
SNF, Non-Emergent EMS Transport, and Home Health Benefits:  Number called: [phone] [facility] Provider Line) Rep: Altha Harm Reference Number: [id number]  [place] active as of 06/04/16.  $400 deductible, which has been met. Out of pocket max is $800, which has been satisfied.  In-network SNF: covered at 100% of the allowed amount for services as both deductible and out of pocket max met for calendar year.  Limited to 60 days per calendar year, all available per rep at time of call.  Auth required: [id number].   Non-emergent EMS transport: covered at 100% of the allowed amount for services as both deductible and out of pocket max met for calendar year.  Must be medically necessary.  No coverage for transports done for convenience reasons.  [name] is not required for service codes [phone] or [phone].   In-network Home Health: covered at 100% of the allowed amount for services as both deductible and out of pocket max met for calendar year. Auth required: [id number].

## 2016-08-14 NOTE — Progress Notes (Signed)
[facility] INFECTIOUS DISE[facility] PROGRESS NOTE Date of Admission:  07/20/2016     ID: Eliseo Gum Mclinden is a 51 y.o. female with  intrabdominal infection post perforated peptic ulcer Active Problems:   Perforated ulcer ([place])   Abdominal abscess ([place])   Abnormal respirations   Pleural effusion   Respiratory distress   Empyema of left pleural space (HCC)   Subjective: S/p decortication, has bil chest tubes, feels a little better.   ROS  Eleven systems are reviewed and negative except per hpi  Medications:  Antibiotics Given (last 72 hours)    Date/Time Action Medication Dose Rate   08/13/16 1000 Given   ertapenem (INVANZ) 1 g in sodium chloride 0.9 % 50 mL IVPB 1 g 100 mL/hr   08/14/16 0950 Given   ertapenem (INVANZ) 1 g in sodium chloride 0.9 % 50 mL IVPB 1 g 100 mL/hr     . enoxaparin (LOVENOX) injection  40 mg Subcutaneous Q24H  . ertapenem  1 g Intravenous Q24H  . feeding supplement (ENSURE ENLIVE)  237 mL Oral TID BM  . FLUoxetine  40 mg Oral q morning - 10a  . mouth rinse  15 mL Mouth Rinse BID  . pantoprazole (PROTONIX) IV  40 mg Intravenous Q12H  . sodium chloride flush  10-40 mL Intracatheter Q12H    Objective: Vital signs in last 24 hours: Temp:  [97.6 F (36.4 C)-98.8 F (37.1 C)] 98.2 F (36.8 C) (03/13 1318) Pulse Rate:  [85-92] 90 (03/13 1318) Resp:  [16-18] 16 (03/13 1318) BP: (96-107)/(54-64) 107/64 (03/13 1318) SpO2:  [97 %-100 %] 100 % (03/13 1318) Weight:  [60.6 kg (133 lb 11.2 oz)] 60.6 kg (133 lb 11.2 oz) (03/13 0500) Constitutional:  Ill appearing HENT: [place]/AT, PERRLA, no scleral icterus Mouth/Throat: Oropharynx is clear and dry. No oropharyngeal exudate.  Cardiovascular: Normal rate, regular rhythm and normal heart sounds.  Pulmonary/Chest: bil rhonchi Bil chest tubes with ss drainage Neck = supple, no nuchal rigidity Abdominal: Soft. abd mildly distended, inciision along midline covered with wound vac 3 JP drains in place, 2 on R - one has  purulent material, other ss  1 on L with ss drainage. abd mildly tender Lymphadenopathy: no cervical adenopathy. No axillary adenopathy Neurological: alert and oriented to person, place, and time.  Skin: Skin is warm and dry. No rash noted. No erythema.  PICC RUE wnl Foley cath in place Psychiatric: a normal mood and affect.  behavior is normal.   Lab Results  Recent Labs  08/12/16 0631 08/13/16 0357 08/13/16 0802 08/14/16 0544  WBC 24.2* 17.1*  --  16.7*  HGB 7.9* 7.9*  --  7.8*  HCT 24.4* 24.2*  --  22.4*  NA 139  --  135  --   K 3.1*  --  3.5  --   CL 107  --  104  --   CO2 27  --  28  --   BUN 9  --  16  --   CREATININE <0.30*  --  <0.30*  --     Microbiology: Results for orders placed or performed during the hospital encounter of 07/20/16  [place] rt PCR ([place] only)     Status: None   Collection Time: 07/20/16  1:15 PM  Result Value Ref Range Status   Specimen source GC/Chlam ENDOCERVICAL  Final   Chlamydia Tr NOT DETECTED NOT DETECTED Final   N gonorrhoeae NOT DETECTED NOT DETECTED Final    Comment: (NOTE) 100  This methodology  has not been evaluated in pregnant women or in 200  patients with a history of hysterectomy. 300 400  This methodology will not be performed on patients less than 62  years of age.   Wet prep, genital     Status: Abnormal   Collection Time: 07/20/16  1:15 PM  Result Value Ref Range Status   Yeast Wet Prep HPF POC NONE SEEN NONE SEEN Final   Trich, Wet Prep NONE SEEN NONE SEEN Final   Clue Cells Wet Prep HPF POC NONE SEEN NONE SEEN Final   WBC, Wet Prep HPF POC RARE (A) NONE SEEN Final   Sperm NONE SEEN  Final  Blood culture (routine x 2)     Status: None   Collection Time: 07/20/16  3:07 PM  Result Value Ref Range Status   Specimen Description BLOOD left forearm  Final   Special Requests   Final    BOTTLES DRAWN AEROBIC AND ANAEROBIC AER12ML ANA12ML   Culture NO GROWTH 5 DAYS  Final   Report Status 07/25/2016 FINAL  Final   MRSA PCR Screening     Status: None   Collection Time: 07/20/16  9:29 PM  Result Value Ref Range Status   MRSA by PCR NEGATIVE NEGATIVE Final    Comment:        The GeneXpert MRSA Assay (FDA approved for NASAL specimens only), is one component of a comprehensive MRSA colonization surveillance program. It is not intended to diagnose MRSA infection nor to guide or monitor treatment for MRSA infections.   Aerobic/Anaerobic Culture (surgical/deep wound)     Status: None   Collection Time: 07/25/16  3:00 PM  Result Value Ref Range Status   Specimen Description ABSCESS RIGHT ABDOMEN  Final   Special Requests NONE  Final   Gram Stain   Final    RARE WBC PRESENT, PREDOMINANTLY PMN NO ORGANISMS SEEN    Culture   Final    No growth aerobically or anaerobically. Performed at [facility], [place] [address] [place], [place] [address]    Report Status 07/31/2016 FINAL  Final  Body fluid culture     Status: None   Collection Time: 07/30/16 11:40 AM  Result Value Ref Range Status   Specimen Description PLEURAL  Final   Special Requests NONE  Final   Gram Stain   Final    MODERATE WBC PRESENT, PREDOMINANTLY PMN NO ORGANISMS SEEN    Culture   Final    No growth aerobically or anaerobically. Performed at [facility], [place] [address], [place], [place] [address]    Report Status 08/03/2016 FINAL  Final  Acid Fast Smear (AFB)     Status: None   Collection Time: 07/30/16 11:40 AM  Result Value Ref Range Status   AFB Specimen Processing Concentration  Final   Acid Fast Smear Negative  Final    Comment: (NOTE) Performed At: [facility] [place], [place] [id number] Lindon Romp MD [id number]    Source (AFB) PLEURAL  Final  Aerobic Culture (superficial specimen)     Status: None   Collection Time: 08/06/16  3:34 PM  Result Value Ref Range Status   Specimen Description ABDOMEN  Final   Special Requests Normal  Final   Gram Stain   Final     RARE WBC PRESENT,BOTH PMN AND MONONUCLEAR NO ORGANISMS SEEN Performed at [facility], [address] [place], [place] [address]    Culture RARE ESCHERICHIA  COLI  Final   Report Status 08/09/2016 FINAL  Final   Organism ID, Bacteria ESCHERICHIA COLI  Final      Susceptibility   Escherichia coli - MIC*    AMPICILLIN <=2 SENSITIVE Sensitive     CEFAZOLIN <=4 SENSITIVE Sensitive     CEFEPIME <=1 SENSITIVE Sensitive     CEFTAZIDIME <=1 SENSITIVE Sensitive     CEFTRIAXONE <=1 SENSITIVE Sensitive     CIPROFLOXACIN <=0.25 SENSITIVE Sensitive     GENTAMICIN <=1 SENSITIVE Sensitive     IMIPENEM <=0.25 SENSITIVE Sensitive     TRIMETH/SULFA <=20 SENSITIVE Sensitive     AMPICILLIN/SULBACTAM <=2 SENSITIVE Sensitive     PIP/TAZO <=4 SENSITIVE Sensitive     Extended ESBL NEGATIVE Sensitive     * RARE ESCHERICHIA COLI  Fungus Culture With Stain     Status: None (Preliminary result)   Collection Time: 08/08/16  3:31 PM  Result Value Ref Range Status   Fungus Stain Final report  Final    Comment: (NOTE) Performed At: [facility] [address] [place], [place] [id number] Lindon Romp MD [id number]    Fungus (Mycology) Culture PENDING  Incomplete   Fungal Source ABDOMEN  Final    Comment: ABDOMINAL WALL WOUND  GOES WITH ACC [id number]   Fungus Culture With Stain     Status: None (Preliminary result)   Collection Time: 08/08/16  3:31 PM  Result Value Ref Range Status   Fungus Stain Final report  Final    Comment: (NOTE) Performed At: [facility] [place], [place] [id number] Lindon Romp MD [id number]    Fungus (Mycology) Culture PENDING  Incomplete   Fungal Source ABDOMEN  Final    Comment: ABDOMINAL WALL 2  GOES WITH ACC [id number]   Fungus Culture With Stain     Status: None (Preliminary result)   Collection Time: 08/08/16  3:31 PM  Result Value Ref Range Status   Fungus Stain Final report  Final    Comment: (NOTE) Performed At: [facility] [place], [place] [id number] Lindon Romp MD [id number]    Fungus (Mycology) Culture PENDING  Incomplete   Fungal Source PLEURAL  Final    Comment: RIGHT PLEURAL SPACE  Fungus Culture With Stain     Status: None (Preliminary result)   Collection Time: 08/08/16  3:31 PM  Result Value Ref Range Status   Fungus Stain Final report  Final    Comment: (NOTE) Performed At: [facility] [place], [place] [id number] Lindon Romp MD [id number]    Fungus (Mycology) Culture PENDING  Incomplete   Fungal Source PLEURAL  Final    Comment: LEFT PLEURAL SPACE  Aerobic/Anaerobic Culture (surgical/deep wound)     Status: None   Collection Time: 08/08/16  3:31 PM  Result Value Ref Range Status   Specimen Description WOUND  Final   Special Requests ABDOMINAL WALL WOUND  Final   Gram Stain   Final    ABUNDANT WBC PRESENT, PREDOMINANTLY PMN RARE GRAM POSITIVE COCCI IN PAIRS Performed at [facility], [address] [place], [place] [address]    Culture   Final    FEW ESCHERICHIA COLI RARE ACTINOMYCES ODONTOLYTICUS SUSCEPTIBILITIES PERFORMED ON PREVIOUS CULTURE WITHIN THE LAST 5 DAYS. MIXED ANAEROBIC FLORA PRESENT.  CALL LAB IF FURTHER IID REQUIRED.    Report Status 08/13/2016 FINAL  Final  Aerobic/Anaerobic Culture (surgical/deep wound)     Status: None   Collection Time:  08/08/16  3:31 PM  Result Value Ref Range Status   Specimen Description WOUND  Final   Special Requests ABDOMINAL WALL WOUND 2  Final   Gram Stain   Final    ABUNDANT WBC PRESENT, PREDOMINANTLY PMN RARE GRAM POSITIVE COCCI IN PAIRS FEW GRAM POSITIVE RODS    Culture   Final    RARE ESCHERICHIA COLI RARE ACTINOMYCES ODONTOLYTICUS FEW BACTEROIDES FRAGILIS RARE BACTEROIDES THETAIOTAOMICRON BETA LACTAMASE POSITIVE FOR BOTH BACTEROIDES SPECIES Performed at [facility], [place] [address] [place], [place] [address]    Report Status 08/13/2016  FINAL  Final   Organism ID, Bacteria ESCHERICHIA COLI  Final      Susceptibility   Escherichia coli - MIC*    AMPICILLIN 4 SENSITIVE Sensitive     CEFAZOLIN <=4 SENSITIVE Sensitive     CEFEPIME <=1 SENSITIVE Sensitive     CEFTAZIDIME <=1 SENSITIVE Sensitive     CEFTRIAXONE <=1 SENSITIVE Sensitive     CIPROFLOXACIN <=0.25 SENSITIVE Sensitive     GENTAMICIN <=1 SENSITIVE Sensitive     IMIPENEM <=0.25 SENSITIVE Sensitive     TRIMETH/SULFA <=20 SENSITIVE Sensitive     AMPICILLIN/SULBACTAM <=2 SENSITIVE Sensitive     PIP/TAZO <=4 SENSITIVE Sensitive     Extended ESBL NEGATIVE Sensitive     * RARE ESCHERICHIA COLI  Aerobic/Anaerobic Culture (surgical/deep wound)     Status: None   Collection Time: 08/08/16  3:31 PM  Result Value Ref Range Status   Specimen Description WOUND  Final   Special Requests RIGHT PLURAL SPACE  Final   Gram Stain   Final    FEW WBC PRESENT,BOTH PMN AND MONONUCLEAR NO ORGANISMS SEEN    Culture   Final    NO GROWTH 5 DAYS Performed at [facility], [address] [place], [place] [address]    Report Status 08/13/2016 FINAL  Final  Aerobic/Anaerobic Culture (surgical/deep wound)     Status: None   Collection Time: 08/08/16  3:31 PM  Result Value Ref Range Status   Specimen Description WOUND  Final   Special Requests LEFT PLERAL SPACE  Final   Gram Stain   Final    FEW WBC PRESENT,BOTH PMN AND MONONUCLEAR NO ORGANISMS SEEN    Culture   Final    MODERATE BACTEROIDES FRAGILIS BETA LACTAMASE POSITIVE Performed at [facility], [place] [address] [place], [place] [address]    Report Status 08/13/2016 FINAL  Final  Fungus Culture With Stain     Status: None (Preliminary result)   Collection Time: 08/08/16  3:31 PM  Result Value Ref Range Status   Fungus Stain Final report  Final    Comment: (NOTE) Performed At: [facility], [place] [id number] Lindon Romp MD [id number]    Fungus (Mycology) Culture  PENDING  Incomplete   Fungal Source PLEURAL  Final    Comment: PLEURAL [place]  Aerobic/Anaerobic Culture (surgical/deep wound)     Status: None   Collection Time: 08/08/16  3:31 PM  Result Value Ref Range Status   Specimen Description WOUND  Final   Special Requests PLEURAL [place] LEFT  Final   Gram Stain   Final    FEW WBC PRESENT,BOTH PMN AND MONONUCLEAR NO ORGANISMS SEEN Performed at [facility], [address], [place], [place] [address]    Culture   Final    FEW BACTEROIDES FRAGILIS BETA LACTAMASE POSITIVE RARE CLOSTRIDIUM CADAVERIS    Report Status 08/13/2016 FINAL  Final  Fungus Culture Result     Status: None   Collection Time: 08/08/16  3:31 PM  Result Value Ref Range Status   Result 1 Comment  Final    Comment: (NOTE) KOH/Calcofluor preparation:  no fungus observed. Performed At: [facility] [place], [place] [id number] Lindon Romp MD [id number]   Fungus Culture Result     Status: None   Collection Time: 08/08/16  3:31 PM  Result Value Ref Range Status   Result 1 Comment  Final    Comment: (NOTE) KOH/Calcofluor preparation:  no fungus observed. Performed At: [facility] [place], [place] [id number] Lindon Romp MD [id number]   Fungus Culture Result     Status: None   Collection Time: 08/08/16  3:31 PM  Result Value Ref Range Status   Result 1 Comment  Final    Comment: (NOTE) KOH/Calcofluor preparation:  no fungus observed. Performed At: [facility] [place], [place] [id number] Lindon Romp MD [id number]   Fungus Culture Result     Status: None   Collection Time: 08/08/16  3:31 PM  Result Value Ref Range Status   Result 1 Comment  Final    Comment: (NOTE) KOH/Calcofluor preparation:  no fungus observed. Performed At: [facility] [place], [place] [id number] Lindon Romp MD [id number]   Fungus Culture Result     Status: None    Collection Time: 08/08/16  3:31 PM  Result Value Ref Range Status   Result 1 Comment  Final    Comment: (NOTE) KOH/Calcofluor preparation:  no fungus observed. Performed At: [facility] [place], [place] [id number] Lindon Romp MD [id number]   Aerobic/Anaerobic Culture (surgical/deep wound)     Status: None (Preliminary result)   Collection Time: 08/12/16  8:34 AM  Result Value Ref Range Status   Specimen Description WOUND PERITONEAL SITE FOR WOUND VAC DRESSING  Final   Special Requests NONE  Final   Gram Stain   Final    FEW WBC PRESENT, PREDOMINANTLY PMN RARE GRAM VARIABLE COCCI    Culture   Final    CULTURE REINCUBATED FOR BETTER GROWTH Performed at [facility], [place] [address] [place], [place] [address]    Report Status PENDING  Incomplete    Studies/Results: Dg Chest 2 View  Result Date: 08/14/2016 CLINICAL DATA:  Follow-up empyema with chest tube treatment following thoracoscopy and debridement of abdominal wall abscess 6 days ago EXAM: CHEST  2 VIEW COMPARISON:  Portable chest x-ray of August 10, 2016 FINDINGS: The lungs are reasonably well inflated. There is a persistent tiny right apical pneumothorax which is smaller today. There is persistent increased density in the retrocardiac region on the left. There is partial obscuration of the left hemidiaphragm. The cardiac silhouette and pulmonary vascularity are normal. A chest tube on the right is stable with the tip in the medial cardio phrenic angle. On the left there are 3 chest tubes in place which are stable with 2 having their tips in the mid to upper lung and 1 having its tip in the lateral costophrenic gutter. A drainage tube projects in the left upper quadrant of the abdomen. IMPRESSION: Interval improvement in the appearance of the pulmonary interstitium bilaterally. Persistent small bilateral pleural effusions with bibasilar subsegmental atelectasis. Persistent tiny right apical  pneumothorax which has decreased in size since yesterday's study. Electronically Signed   By: Lurdes Haltiwanger  [place] M.D.  On: 08/14/2016 07:31    Assessment/Plan: SHLOKA BALDRIDGE is a 50 y.o. female with perforated peptic ulcer, with complicated course since admission with placement of an IR drain and then repeat surgery 2/26. All cultures have been negative. She has been on zosyn since admission and started fluconazole 2/26.  Multiple drains in place.  PICC in place and on TPN.  Had L empyema noted and s/p decortication with about 500 cc pus aspirated with cx growing bacteroides, and clostridium. Wound cx with E coli, actinomyces,  Abd wound cx with E coli- sensitive to zosyn. Fungal cxs neg to date WBC down 39-> 16.7 after surgery Her R abd JP drain has some purulent drainage Was changed to ertapenem from zosyn.  Stopped fluconazole.  Recommendations Cont ertapenem   Continue management of chest tubes per Dr Genevive Bi  Thank you very much for the consult. Will follow with you.  Daune Divirgilio P   08/14/2016, 4:26 PM

## 2016-08-14 NOTE — Progress Notes (Signed)
08/14/2016  Subjective: Patient is s/p exploratory laparotomies for perforated peptic ulcer and left thoracotomy for empyema.  No acute events overnight.  Patient's mother passed away yesterday and patient reports that her family is trying to coordinate funeral services and possible cremation.  Tolerating diet, no worsening pain, no nausea/vomiting.  Pain controlled.  Vital signs: Temp:  [97.6 F (36.4 C)-98.8 F (37.1 C)] 97.7 F (36.5 C) (03/13 0536) Pulse Rate:  [85-92] 91 (03/13 0536) Resp:  [16-18] 18 (03/13 0536) BP: (96-106)/(54-59) 106/57 (03/13 0536) SpO2:  [97 %-100 %] 100 % (03/13 0536) Weight:  [60.6 kg (133 lb 11.2 oz)] 60.6 kg (133 lb 11.2 oz) (03/13 0500)   Intake/Output: 03/12 0701 - 03/13 0700 In: 170 [P.O.:120; IV Piggyback:50] Out: 2585 [Urine:1250; Drains:155; Chest Tube:230] Last BM Date: 08/12/16  Physical Exam: Constitutional: No acute distress Cardiac:  Regular rhythm and rate Pulm:  Right chest tube to waterseal. Left chest tubes to suction, with serosanguinous fluid drainage.   Abdomen:  Soft, nondistended, appropriately tender.  Wound vac in place to suction with good seal.  JP drain with seropurulent fluid.  Labs:   Recent Labs  08/13/16 0357 08/14/16 0544  WBC 17.1* 16.7*  HGB 7.9* 7.8*  HCT 24.2* 22.4*  PLT 950* 837*    Recent Labs  08/12/16 0631 08/13/16 0802  NA 139 135  K 3.1* 3.5  CL 107 104  CO2 27 28  GLUCOSE 74 109*  BUN 9 16  CREATININE <0.30* <0.30*  CALCIUM 7.3* 7.7*   No results for input(s): LABPROT, INR in the last 72 hours.  Imaging: Dg Chest 2 View  Result Date: 08/14/2016 CLINICAL DATA:  Follow-up empyema with chest tube treatment following thoracoscopy and debridement of abdominal wall abscess 6 days ago EXAM: CHEST  2 VIEW COMPARISON:  Portable chest x-ray of August 10, 2016 FINDINGS: The lungs are reasonably well inflated. There is a persistent tiny right apical pneumothorax which is smaller today. There is  persistent increased density in the retrocardiac region on the left. There is partial obscuration of the left hemidiaphragm. The cardiac silhouette and pulmonary vascularity are normal. A chest tube on the right is stable with the tip in the medial cardio phrenic angle. On the left there are 3 chest tubes in place which are stable with 2 having their tips in the mid to upper lung and 1 having its tip in the lateral costophrenic gutter. A drainage tube projects in the left upper quadrant of the abdomen. IMPRESSION: Interval improvement in the appearance of the pulmonary interstitium bilaterally. Persistent small bilateral pleural effusions with bibasilar subsegmental atelectasis. Persistent tiny right apical pneumothorax which has decreased in size since yesterday's study. Electronically Signed   By: David  [place] M.D.   On: 08/14/2016 07:31    Assessment/Plan: 51 yo female s/p exploratory laparotomies and left thoracotomy  --Will discuss with Dr. Genevive Bi and Case management regarding possible options for discharge so that the patient may be able to attend her mother's funeral services and cremation.  She currently has bilateral chest tubes with right chest tube possibly being pulled today.  She also has wound vac in place. --Wound vac change today --Continue IV invanz --Incentive spirometry and wean O2.   Melvyn Neth, [place]

## 2016-08-14 NOTE — Progress Notes (Addendum)
Occupational Therapy Evaluation Patient Details Name: Lindsey Wolf MRN: [id number] DOB: 1965-09-24 Today's Date: 08/14/2016    History of Present Illness Pt is a 51 year old female status post complicated repair of peptic ulcer disease. She later underwent a repeat laparotomy for drainage of intra-abdominal abscesses. Pt diagnosed with Acute hypoxic respiratory failure with L chest tube placement, septic shock with hypotension, and A-fib with RVR.  Pt also has multiple JP drains and was intubated but is now extubated. Pt underwent L thoracotomy and thoracoscopy along with debridement of abdominal wall abscess on 08/08/16.  Pt now with B chest tubes, JP drains, and wound vac. Of note, small pneumothorax per chart review.  Patient underwent wound vac placement and debridement of abd wound 08/12/16, With wound vac replacement on 08/14/2016.   Clinical Impression   Pt. Is a 51 y.o. Female who was admitted to [facility] for complicated repair of Peptic Ulcer Disease. Pt. Underwent wound vac replacement this a.m. Pt. Presents with weakness, decreased activity tolerance, pain, and impaired functional mobility during ADLs. Pt. could benefit from skilled OT services for ADL training, A/E training, there. Ex, therapeutic Activity, energy conservation,work simplification techniques, and pt. education about home management, and DME. Pt. Would like to return home upon discharge. Pt. is planning to have her son stay with her for the first few weeks.    Follow Up Recommendations  Home health OT    Equipment Recommendations       Recommendations for Other Services       Precautions / Restrictions Precautions Precautions: Fall Restrictions Weight Bearing Restrictions: No                                                     ADL Overall ADL's : Needs assistance/impaired     Grooming: Set up               Lower Body Dressing: Moderate assistance               Functional  mobility during ADLs:  (Deffered. Pt. had wound vac changed in a.m.) General ADL Comments: Pt. was verbally educated about A/E use for LE ADLs, energy conservation, and work simplification techniques.     Vision Baseline Vision/History: No visual deficits Patient Visual Report: No change from baseline       Perception     Praxis      Pertinent Vitals/Pain Pain Assessment: 0-10 Pain Score: 7  Pain Location: surgical site, and tubes. Pain Descriptors / Indicators: Aching Pain Intervention(s): Monitored during session;Limited activity within patient's tolerance     Hand Dominance Right   Extremity/Trunk Assessment Upper Extremity Assessment Upper Extremity Assessment: Generalized weakness           Communication Communication Communication: No difficulties   Cognition Arousal/Alertness: Awake/alert Behavior During Therapy: WFL for tasks assessed/performed Overall Cognitive Status: Within Functional Limits for tasks assessed                     General Comments       Exercises       Shoulder Instructions      Home Living Family/patient expects to be discharged to:: Private residence Living Arrangements: Alone;Children (Son may live with her.) Available Help at Discharge: Family Type of Home: Mobile home Home Access: Ramped entrance (7 steps)  Home Layout: One level               Home Equipment: Walker - 2 wheels;Walker - 4 wheels;Cane - quad;Wheelchair - manual;Tub bench          Prior Functioning/Environment Level of Independence: Independent                 OT Problem List: Decreased activity tolerance;Decreased strength      OT Treatment/Interventions: Self-care/ADL training;Therapeutic exercise;Patient/family education;Therapeutic activities    OT Goals(Current goals can be found in the care plan section) Acute Rehab OT Goals Patient Stated Goal: To return home, and to be able to pay her bills. OT Goal Formulation: With  patient Potential to Achieve Goals: Good ADL Goals Pt Will Perform Lower Body Dressing: with modified independence Pt Will Transfer to Toilet: with modified independence  OT Frequency: Min 1X/week   Barriers to D/C:            Co-evaluation              End of Session    Activity Tolerance: Patient tolerated treatment well Patient left: in bed;with call bell/phone within reach;with bed alarm set  OT Visit Diagnosis: Muscle weakness (generalized) (M62.81)                ADL either performed or assessed with clinical judgement  Time: 1350-1415 OT Time Calculation (min): 25 min Charges:  OT General Charges $OT Visit: 1 Procedure OT Evaluation $OT Eval Low Complexity: 1 Procedure G-Codes: OT G-codes **NOT FOR INPATIENT CLASS** Self Care Current Status (I3704): At least 1 percent but less than 20 percent impaired, limited or restricted   Lindsey Carina, MS, OTR/L   Lindsey Carina, MS, OTR/L 08/14/2016, 3:01 PM

## 2016-08-14 NOTE — Progress Notes (Signed)
PT Cancellation Note  Patient Details Name: MICHALINE KINDIG MRN: [id number] DOB: April 07, 1966   Cancelled Treatment:    Reason Eval/Treat Not Completed: Other (comment) . Treatment attempted, however pt requesting pain meds and MD is about to perform wound vac change. RN request to come another time. Will re-attempt.  Rochester Serpe 08/14/2016, 9:39 AM Greggory Stallion, PT, DPT [phone]

## 2016-08-14 NOTE — Care Management (Signed)
PT has assessed patient and recommended CIR.  Spoke with Genie  At inpatient rehab. Per Genie patient will not be appropriate for inpatient rehab due to plan to discharge with chest tubes in place.  Inpatient rehab does not accommodate chest tubes.  Genie also states that there is no bed availability at this time.

## 2016-08-14 NOTE — Progress Notes (Signed)
[place] [facility] [place] NAME: Lindsey Wolf    MR#:  [id number]  DATE OF BIRTH:  [birth year]  SUBJECTIVE:    No acute events overnight. No abdominal pain. No N/V. B/l CT still in place.  Patient is status post exploratory laparotomy for perforated peptic ulcer and also left-sided thoracoscopy for empyema.  REVIEW OF SYSTEMS:    Review of Systems  Constitutional: Negative for chills, fever and malaise/fatigue.  HENT: Negative.  Negative for ear discharge, ear pain, hearing loss, nosebleeds and sore throat.   Eyes: Negative.  Negative for blurred vision and pain.  Respiratory: Negative.  Negative for cough, hemoptysis, shortness of breath and wheezing.   Cardiovascular: Negative.  Negative for chest pain, palpitations and leg swelling.  Gastrointestinal: Positive for abdominal pain. Negative for blood in stool, diarrhea, nausea and vomiting.  Genitourinary: Negative.  Negative for dysuria.  Musculoskeletal: Negative for back pain.  Skin: Negative.   Neurological: Positive for weakness. Negative for dizziness, tremors, speech change, focal weakness, seizures and headaches.  Endo/Heme/Allergies: Negative.  Does not bruise/bleed easily.  Psychiatric/Behavioral: Negative.  Negative for depression, hallucinations and suicidal ideas.   Tolerating Diet: Soft diet Yes   DRUG ALLERGIES:  No Known Allergies  VITALS:  Blood pressure (!) 106/57, pulse 91, temperature 97.7 F (36.5 C), temperature source Oral, resp. rate 18, height 5\' 10"  (1.778 m), weight 60.6 kg (133 lb 11.2 oz), SpO2 100 %.  PHYSICAL EXAMINATION:   Physical Exam  Constitutional: She is well-developed, well-nourished, and in no distress. No distress.  HENT:  Head: Normocephalic.  Eyes: No scleral icterus.  Neck: Normal range of motion. Neck supple. No JVD present. No tracheal deviation present.  Cardiovascular: Normal rate, regular rhythm and normal heart sounds.  Exam reveals no  gallop and no friction rub.   No murmur heard. Pulmonary/Chest: Effort normal and breath sounds normal. No respiratory distress. She has no wheezes. She has no rales. She exhibits no tenderness.  B/l chest tubes  Abdominal: She exhibits no distension and no mass. There is no tenderness. There is no rebound and no guarding.  Wound vac in place in mid-abdomen.  Musculoskeletal: Normal range of motion. She exhibits no edema.  Neurological: She is alert.  Skin: Skin is warm. No rash noted. No erythema.  Psychiatric: Affect and judgment normal.      LABORATORY PANEL:   CBC  Recent Labs Lab 08/14/16 0544  WBC 16.7*  HGB 7.8*  HCT 22.4*  PLT 837*   ------------------------------------------------------------------------------------------------------------------  Chemistries   Recent Labs Lab 08/08/16 0607  08/13/16 0802  NA 136  < > 135  K 2.5*  < > 3.5  CL 104  < > 104  CO2 23  < > 28  GLUCOSE 107*  < > 109*  BUN 9  < > 16  CREATININE <0.30*  < > <0.30*  CALCIUM 7.2*  < > 7.7*  MG 1.7  < > 1.9  AST 27  --   --   ALT 18  --   --   ALKPHOS 202*  --   --   BILITOT 0.5  --   --   < > = values in this interval not displayed. ------------------------------------------------------------------------------------------------------------------  Cardiac Enzymes No results for input(s): TROPONINI in the last 168 hours. ------------------------------------------------------------------------------------------------------------------  RADIOLOGY:  Dg Chest 2 View  Result Date: 08/14/2016 CLINICAL DATA:  Follow-up empyema with chest tube treatment following thoracoscopy and debridement of abdominal wall abscess 6 days  ago EXAM: CHEST  2 VIEW COMPARISON:  Portable chest x-ray of August 10, 2016 FINDINGS: The lungs are reasonably well inflated. There is a persistent tiny right apical pneumothorax which is smaller today. There is persistent increased density in the retrocardiac region on  the left. There is partial obscuration of the left hemidiaphragm. The cardiac silhouette and pulmonary vascularity are normal. A chest tube on the right is stable with the tip in the medial cardio phrenic angle. On the left there are 3 chest tubes in place which are stable with 2 having their tips in the mid to upper lung and 1 having its tip in the lateral costophrenic gutter. A drainage tube projects in the left upper quadrant of the abdomen. IMPRESSION: Interval improvement in the appearance of the pulmonary interstitium bilaterally. Persistent small bilateral pleural effusions with bibasilar subsegmental atelectasis. Persistent tiny right apical pneumothorax which has decreased in size since yesterday's study. Electronically Signed   By: David  Martinique M.D.   On: 08/14/2016 07:31     ASSESSMENT AND PLAN:    51 year old female admitted with the perforation peptic ulcer and s/p 2 exploratory laparotomies, and also left-sided thoracoscopy for empyema.   1. Perforated peptic ulcer with complicated course since admission: - POD #6 left thoracoscopy and thoracotomy for empyema  - POD #2 for debridement/drainage of abscess of an abdominal wound.  - cont. Ertapenem and d/c Fluconazole as cultures are (-) so far.  - Wound culture from 3/5 and 3/7 e coli pansensitve,  - WBC decreasing and cont. Further care as per Gen. Surgery.   2. Hypokalemia: improved w/ supplementation and stable.   3. Acute blood loss anemia: Patient transfused 3/6 - Hemoglobin 7.8 this morning and stable no further need for transfusion.  Would transfuse if hemoglobin less than 7.   4. Severe sepsis on admission: Resolved  5. PAF: Resolved  6. Hyponatremia: resolved and sodium level is stable.   7. Anxiety - Ativan PRn  Will sign off for now and discussed w/ Dr. Hampton Abbot. Please call back if any other further help needed.   CODE STATUS: FULL  TOTAL TIME TAKING CARE OF THIS PATIENT: 25 minutes.   Physical therapy is  recommending skilled nursing facility at discharge  dispo planning as per Surgery.    Henreitta Leber M.D on 08/14/2016 at 12:57 PM  Between 7am to 6pm - Pager - [phone]  After 6pm go to [email or web address] - password [email or web address] [facility]  Office  [phone]  CC: Primary care physician; No PCP Per Patient  Note: This dictation was prepared with Dragon dictation along with smaller phrase technology. Any transcriptional errors that result from this process are unintentional.

## 2016-08-14 NOTE — Progress Notes (Signed)
Went to complete dressing change to bilateral chest tubes - pt refused at this time stating that "I'm sleeping really good." Will offer again before shift change if she wakes up.

## 2016-08-14 NOTE — Progress Notes (Signed)
Nutrition Follow-up  DOCUMENTATION CODES:   Not applicable  INTERVENTION:  Continue Ensure Enlive po TID, each supplement provides 350 kcal and 20 grams of protein. Will update order to reflect that patient prefers strawberry flavor served cold or with a cup of ice.  Encouraged adequate intake of calories and protein through meals and beverages.  NUTRITION DIAGNOSIS:   Inadequate oral intake related to inability to eat as evidenced by NPO status.  Ongoing inadequate intake, even with diet advancement.  GOAL:   Patient will meet greater than or equal to 90% of their needs  Not met.  MONITOR:   I & O's, Diet advancement, Labs, Weight trends, Supplement acceptance  REASON FOR ASSESSMENT:   Malnutrition Screening Tool    ASSESSMENT:   Lindsey Wolf is a 51 y.o. female with a 3 days hx of abdominal pain in the epigastric area and she thinks it was a gas type. Pain is moderate to severe in intensity and has worsening today.  -TPN was discontinued over the weekend and patient was advanced to soft diet on 3/11.   Spoke with patient at bedside. She reports her appetite is slowly improving. She has not been drinking the Ensure ordered because she reports it comes when she is not ready to drink it and then it gets warm. She is amenable to drinking cold strawberry Ensure. Per chart patient had only bites of breakfast today. Patient is reporting she had 50% of breakfast this morning but did not eat well at dinner last night. She refused lunch yesterday per [employer]. Discussed importance of adequate intake of calories and protein to maintain adequate nutrition for healing.   If patient did eat 50% of breakfast this morning, she has only had 280 kcal (14% minimum estimated kcal needs) and 18.5 grams of protein in the past 24 hours (18% minimum estimated protein needs). She refused lunch and dinner today.  Per chart patient's mother passed away yesterday. This may be contributing to  patient's poor PO intake.  Medications reviewed and include: pantoprazole, Zofran PRN.  Labs reviewed: Creatinine <0.3.   Weight trend: 60.6 kg 3/13 (-3.4 kg from previous assessment on 3/9 likely due to decreased fluid intake with discontinuation of TPN, but also could be true weight loss in setting of inadequate intake)  Diet Order:  DIET SOFT Room service appropriate? Yes; Fluid consistency: Thin  Skin:  Wound (see comment) (stage II pressure ulcer in nare)  Last BM:  08/12/2016  Height:   Ht Readings from Last 1 Encounters:  08/08/16 _0  (1.778 m)    Weight:   Wt Readings from Last 1 Encounters:  08/14/16 133 lb 11.2 oz (60.6 kg)    Ideal Body Weight:  68.18 kg  BMI:  Body mass index is 19.18 kg/m.  Estimated Nutritional Needs:   Kcal:  2050-2400 kcals  Protein:  102-136 g  Fluid:  >/= 2 L  EDUCATION NEEDS:   No education needs identified at this time  Willey Blade, MS, RD, LDN Pager: [phone] After Hours Pager: [phone]

## 2016-08-14 NOTE — Progress Notes (Signed)
MD note from yesterday stated that bilateral chest tubes were to remain to suction. Right CT is currently to water seal. Patient and Orlinda Blalock, day shift RN, stated that MD made the right CT water seal. Called Dr. Adonis Huguenin to verify - MD stated to leave right CT to water seal.

## 2016-08-14 NOTE — Progress Notes (Signed)
While rounding the unit the [facility] visited with this Pt. Pt was with the physical therapist the Rm at the time so the Chaplain was not able to talk to the Pt. Chaplain plans to make a follow up visit with the Pt on another day and at an appropriate time.   08/14/16 1600  Clinical Encounter Type  Visited With Patient;Health care provider  Visit Type Follow-up  Referral From Chaplain  Spiritual Encounters  Spiritual Needs Prayer;Emotional;Other (Comment)

## 2016-08-15 LAB — CBC WITH DIFFERENTIAL/PLATELET
BASOS ABS: 0.1 10*3/uL (ref 0–0.1)
Basophils Relative: 1 %
Eosinophils Absolute: 0.1 10*3/uL (ref 0–0.7)
Eosinophils Relative: 0 %
HEMATOCRIT: 25.8 % — AB (ref 35.0–47.0)
Hemoglobin: 8.5 g/dL — ABNORMAL LOW (ref 12.0–16.0)
LYMPHS PCT: 4 %
Lymphs Abs: 1.1 10*3/uL (ref 1.0–3.6)
MCH: 25.7 pg — ABNORMAL LOW (ref 26.0–34.0)
MCHC: 32.9 g/dL (ref 32.0–36.0)
MCV: 78.2 fL — AB (ref 80.0–100.0)
MONO ABS: 1.6 10*3/uL — AB (ref 0.2–0.9)
MONOS PCT: 6 %
NEUTROS ABS: 23.8 10*3/uL — AB (ref 1.4–6.5)
Neutrophils Relative %: 89 %
Platelets: 962 10*3/uL (ref 150–440)
RBC: 3.29 MIL/uL — ABNORMAL LOW (ref 3.80–5.20)
RDW: 24.2 % — AB (ref 11.5–14.5)
WBC: 26.8 10*3/uL — ABNORMAL HIGH (ref 3.6–11.0)

## 2016-08-15 MED ORDER — SODIUM CHLORIDE 0.9 % IV SOLN
200.0000 mg | Freq: Once | INTRAVENOUS | Status: AC
  Administered 2016-08-15: 200 mg via INTRAVENOUS
  Filled 2016-08-15: qty 200

## 2016-08-15 MED ORDER — SODIUM CHLORIDE 0.9 % IV SOLN
100.0000 mg | INTRAVENOUS | Status: DC
  Administered 2016-08-16 – 2016-09-04 (×20): 100 mg via INTRAVENOUS
  Filled 2016-08-15 (×26): qty 100

## 2016-08-15 MED ORDER — PIPERACILLIN-TAZOBACTAM 3.375 G IVPB
3.3750 g | Freq: Three times a day (TID) | INTRAVENOUS | Status: AC
  Administered 2016-08-15 – 2016-09-05 (×61): 3.375 g via INTRAVENOUS
  Filled 2016-08-15 (×65): qty 50

## 2016-08-15 MED ORDER — FLUCONAZOLE IN SODIUM CHLORIDE 400-0.9 MG/200ML-% IV SOLN
400.0000 mg | INTRAVENOUS | Status: DC
  Administered 2016-08-15: 400 mg via INTRAVENOUS
  Filled 2016-08-15: qty 200

## 2016-08-15 NOTE — Clinical Social Work Note (Signed)
CSW has received [employer] authorization for patient to go to [facility] at discharge. Shela Leff MSW,LCSW [phone]

## 2016-08-15 NOTE — Progress Notes (Signed)
Physical Therapy Treatment Patient Details Name: Lindsey Wolf MRN: [id number] DOB: April 30, 1966 Today's Date: 08/15/2016    History of Present Illness Pt is a 51 year old female status post complicated repair of peptic ulcer disease. She later underwent a repeat laparotomy for drainage of intra-abdominal abscesses. Pt diagnosed with Acute hypoxic respiratory failure with L chest tube placement, septic shock with hypotension, and A-fib with RVR.  Pt also has multiple JP drains and was intubated but is now extubated. Pt underwent L thoracotomy and thoracoscopy along with debridement of abdominal wall abscess on 08/08/16.  Pt now with B chest tubes, JP drains, and wound vac. Of note, small pneumothorax per chart review.  Patient underwent wound vac placement and debridement of abd wound 08/12/16.    PT Comments    Pt is making good progress towards goals with improved ambulation distance this session. Correct use of RW for AD. +2 required for chest tube. Pt continues to be motivated to perform therapy and demonstrates improved endurance with there-ex. Will continue to progress.   Follow Up Recommendations  SNF     Equipment Recommendations  Rolling walker with 5" wheels    Recommendations for Other Services       Precautions / Restrictions Precautions Precautions: Fall Restrictions Weight Bearing Restrictions: No    Mobility  Bed Mobility Overal bed mobility: Needs Assistance Bed Mobility: Supine to Sit     Supine to sit: Min assist;+2 for safety/equipment     General bed mobility comments: +2 for lines/tubes. Assist for trunk posture. Once seated at EOB, pt able to sit with safe technique. SLight discomfort with change in position.  Transfers Overall transfer level: Needs assistance Equipment used: Rolling walker (2 wheeled) Transfers: Sit to/from Stand Sit to Stand: Mod assist         General transfer comment: assist for pushing from seated position. Once standing, pt  with upright posture. Slight dizziness noted with change in position, however improves quickly. +2 for equipment.  Ambulation/Gait Ambulation/Gait assistance: Min assist;+2 safety/equipment Ambulation Distance (Feet): 40 Feet Assistive device: Rolling walker (2 wheeled) Gait Pattern/deviations: Step-to pattern     General Gait Details: short steps with RW with improved endurance. Pt able to use RW correctly. Pt unsteady and needs min assist for balance.   Stairs            Wheelchair Mobility    Modified Rankin (Stroke Patients Only)       Balance                                    Cognition Arousal/Alertness: Awake/alert Behavior During Therapy: WFL for tasks assessed/performed Overall Cognitive Status: Within Functional Limits for tasks assessed                      Exercises Other Exercises Other Exercises: Supine ther-ex performed including B LE SLR, hip add squeezes, hip abd/add, quad sets, and ankle pumps. ALl ther-ex performed x 15 reps. No rest breaks required this session. CGA given for technique. Safe technique performed    General Comments        Pertinent Vitals/Pain Pain Assessment: No/denies pain    Home Living                      Prior Function            PT Goals (current goals can now be  found in the care plan section) Acute Rehab PT Goals Patient Stated Goal: To return home, and to be able to pay her bills. PT Goal Formulation: With patient Time For Goal Achievement: 08/23/16 Potential to Achieve Goals: Good Progress towards PT goals: Progressing toward goals    Frequency    Min 2X/week      PT Plan Current plan remains appropriate    Co-evaluation             End of Session   Activity Tolerance: Patient limited by fatigue Patient left: in bed;with bed alarm set Nurse Communication: Mobility status PT Visit Diagnosis: Muscle weakness (generalized) (M62.81);Pain;Difficulty in walking,  not elsewhere classified (R26.2)     Time: 9485-4627 PT Time Calculation (min) (ACUTE ONLY): 27 min  Charges:  $Gait Training: 8-22 mins $Therapeutic Exercise: 8-22 mins                    G Codes:       Emad Brechtel 08/27/2016, 3:10 PM Greggory Stallion, PT, DPT [phone]

## 2016-08-15 NOTE — Progress Notes (Signed)
[facility] notified of critical platelets this am.

## 2016-08-15 NOTE — Progress Notes (Signed)
ANTIBIOTIC/ANTIFUnGAL CONSULT NOTE - INITIAL  Pharmacy Consult for Zosyn and Eraxis Indication: intra-abdominal infection  No Known Allergies  Patient Measurements: Height: 5\' 10"  (177.8 cm) Weight: 123 lb 8 oz (56 kg) IBW/kg (Calculated) : 68.5 Adjusted Body Weight:   Vital Signs: Temp: 98.1 F (36.7 C) (03/14 1211) Temp Source: Oral (03/14 1211) BP: 107/60 (03/14 1211) Pulse Rate: 101 (03/14 1211) Intake/Output from previous day: 03/13 0701 - 03/14 0700 In: 290 [P.O.:240; IV Piggyback:50] Out: 3205 [Urine:2800; Drains:325; Chest Tube:80] Intake/Output from this shift: Total I/O In: 120 [P.O.:120] Out: 600 [Urine:600]  Labs:  Recent Labs  08/13/16 0357 08/13/16 0802 08/14/16 0544 08/15/16 0420  WBC 17.1*  --  16.7* 26.8*  HGB 7.9*  --  7.8* 8.5*  PLT 950*  --  837* 962*  CREATININE  --  <0.30*  --   --    CrCl cannot be calculated (This lab value cannot be used to calculate CrCl because it is not a number: <0.30). No results for input(s): VANCOTROUGH, VANCOPEAK, VANCORANDOM, GENTTROUGH, GENTPEAK, GENTRANDOM, TOBRATROUGH, TOBRAPEAK, TOBRARND, AMIKACINPEAK, AMIKACINTROU, AMIKACIN in the last 72 hours.   Microbiology: Recent Results (from the past 720 hour(s))  Chlamydia/NGC rt PCR ([facility] only)     Status: None   Collection Time: 07/20/16  1:15 PM  Result Value Ref Range Status   Specimen source GC/Chlam ENDOCERVICAL  Final   Chlamydia Tr NOT DETECTED NOT DETECTED Final   N gonorrhoeae NOT DETECTED NOT DETECTED Final    Comment: (NOTE) 100  This methodology has not been evaluated in pregnant women or in 200  patients with a history of hysterectomy. 300 400  This methodology will not be performed on patients less than 29  years of age.   Wet prep, genital     Status: Abnormal   Collection Time: 07/20/16  1:15 PM  Result Value Ref Range Status   Yeast Wet Prep HPF POC NONE SEEN NONE SEEN Final   Trich, Wet Prep NONE SEEN NONE SEEN Final   Clue Cells Wet Prep  HPF POC NONE SEEN NONE SEEN Final   WBC, Wet Prep HPF POC RARE (A) NONE SEEN Final   Sperm NONE SEEN  Final  Blood culture (routine x 2)     Status: None   Collection Time: 07/20/16  3:07 PM  Result Value Ref Range Status   Specimen Description BLOOD left forearm  Final   Special Requests   Final    BOTTLES DRAWN AEROBIC AND ANAEROBIC AER12ML ANA12ML   Culture NO GROWTH 5 DAYS  Final   Report Status 07/25/2016 FINAL  Final  MRSA PCR Screening     Status: None   Collection Time: 07/20/16  9:29 PM  Result Value Ref Range Status   MRSA by PCR NEGATIVE NEGATIVE Final    Comment:        The GeneXpert MRSA Assay (FDA approved for NASAL specimens only), is one component of a comprehensive MRSA colonization surveillance program. It is not intended to diagnose MRSA infection nor to guide or monitor treatment for MRSA infections.   Aerobic/Anaerobic Culture (surgical/deep wound)     Status: None   Collection Time: 07/25/16  3:00 PM  Result Value Ref Range Status   Specimen Description ABSCESS RIGHT ABDOMEN  Final   Special Requests NONE  Final   Gram Stain   Final    RARE WBC PRESENT, PREDOMINANTLY PMN NO ORGANISMS SEEN    Culture   Final    No growth aerobically or  anaerobically. Performed at [facility], [place] [address] [place], [place] [address]    Report Status 07/31/2016 FINAL  Final  Body fluid culture     Status: None   Collection Time: 07/30/16 11:40 AM  Result Value Ref Range Status   Specimen Description PLEURAL  Final   Special Requests NONE  Final   Gram Stain   Final    MODERATE WBC PRESENT, PREDOMINANTLY PMN NO ORGANISMS SEEN    Culture   Final    No growth aerobically or anaerobically. Performed at [facility], [place] [address] [place], [place] [address]    Report Status 08/03/2016 FINAL  Final  Acid Fast Smear (AFB)     Status: None   Collection Time: 07/30/16 11:40 AM  Result Value Ref Range Status   AFB Specimen Processing Concentration   Final   Acid Fast Smear Negative  Final    Comment: (NOTE) Performed At: [facility] [place], [place] [id number] Lindon Romp MD [id number]:[id number]    Source (AFB) PLEURAL  Final  Aerobic Culture (superficial specimen)     Status: None   Collection Time: 08/06/16  3:34 PM  Result Value Ref Range Status   Specimen Description ABDOMEN  Final   Special Requests Normal  Final   Gram Stain   Final    RARE WBC PRESENT,BOTH PMN AND MONONUCLEAR NO ORGANISMS SEEN Performed at [facility], [address] [place], [place] [address]    Culture RARE ESCHERICHIA COLI  Final   Report Status 08/09/2016 FINAL  Final   Organism ID, Bacteria ESCHERICHIA COLI  Final      Susceptibility   Escherichia coli - MIC*    AMPICILLIN <=2 SENSITIVE Sensitive     CEFAZOLIN <=4 SENSITIVE Sensitive     CEFEPIME <=1 SENSITIVE Sensitive     CEFTAZIDIME <=1 SENSITIVE Sensitive     CEFTRIAXONE <=1 SENSITIVE Sensitive     CIPROFLOXACIN <=0.25 SENSITIVE Sensitive     GENTAMICIN <=1 SENSITIVE Sensitive     IMIPENEM <=0.25 SENSITIVE Sensitive     TRIMETH/SULFA <=20 SENSITIVE Sensitive     AMPICILLIN/SULBACTAM <=2 SENSITIVE Sensitive     PIP/TAZO <=4 SENSITIVE Sensitive     Extended ESBL NEGATIVE Sensitive     * RARE ESCHERICHIA COLI  Fungus Culture With Stain     Status: None   Collection Time: 08/08/16  3:31 PM  Result Value Ref Range Status   Fungus Stain Final report  Final   Fungus (Mycology) Culture Preliminary report  Final    Comment: (NOTE) Performed At: [facility] [address] [place], [place] [id number] Lindon Romp MD KG:[id number]    Fungal Source ABDOMEN  Final    Comment: ABDOMINAL WALL WOUND  GOES WITH ACC [id number]   Fungus Culture With Stain     Status: None (Preliminary result)   Collection Time: 08/08/16  3:31 PM  Result Value Ref Range Status   Fungus Stain Final report  Final    Comment: (NOTE) Performed At: [facility] [place], [place] [id number] Lindon Romp MD [id number]    Fungus (Mycology) Culture PENDING  Incomplete   Fungal Source ABDOMEN  Final    Comment: ABDOMINAL WALL 2  GOES WITH ACC [id number]   Fungus Culture With Stain     Status: None (Preliminary result)   Collection Time: 08/08/16  3:31 PM  Result Value Ref Range Status   Fungus Stain Final report  Final    Comment: (NOTE) Performed At: [facility] [place], [place] [id number] Lindon Romp MD [id number]:[id number]    Fungus (Mycology) Culture PENDING  Incomplete   Fungal Source PLEURAL  Final    Comment: RIGHT PLEURAL SPACE  Fungus Culture With Stain     Status: None (Preliminary result)   Collection Time: 08/08/16  3:31 PM  Result Value Ref Range Status   Fungus Stain Final report  Final    Comment: (NOTE) Performed At: [facility] [place], [place] [id number] Lindon Romp MD [id number]    Fungus (Mycology) Culture PENDING  Incomplete   Fungal Source PLEURAL  Final    Comment: LEFT PLEURAL SPACE  Aerobic/Anaerobic Culture (surgical/deep wound)     Status: None   Collection Time: 08/08/16  3:31 PM  Result Value Ref Range Status   Specimen Description WOUND  Final   Special Requests ABDOMINAL WALL WOUND  Final   Gram Stain   Final    ABUNDANT WBC PRESENT, PREDOMINANTLY PMN RARE GRAM POSITIVE COCCI IN PAIRS Performed at [facility], [place] [address], [place], [place] [address]    Culture   Final    FEW ESCHERICHIA COLI RARE ACTINOMYCES ODONTOLYTICUS SUSCEPTIBILITIES PERFORMED ON PREVIOUS CULTURE WITHIN THE LAST 5 DAYS. MIXED ANAEROBIC FLORA PRESENT.  CALL LAB IF FURTHER IID REQUIRED.    Report Status 08/13/2016 FINAL  Final  Aerobic/Anaerobic Culture (surgical/deep wound)     Status: None   Collection Time: 08/08/16  3:31 PM  Result Value Ref Range Status   Specimen Description WOUND  Final   Special Requests ABDOMINAL WALL WOUND 2  Final   Gram  Stain   Final    ABUNDANT WBC PRESENT, PREDOMINANTLY PMN RARE GRAM POSITIVE COCCI IN PAIRS FEW GRAM POSITIVE RODS    Culture   Final    RARE ESCHERICHIA COLI RARE ACTINOMYCES ODONTOLYTICUS FEW BACTEROIDES FRAGILIS RARE BACTEROIDES THETAIOTAOMICRON BETA LACTAMASE POSITIVE FOR BOTH BACTEROIDES SPECIES Performed at [facility], [place] [address], [place], [place] [address]    Report Status 08/13/2016 FINAL  Final   Organism ID, Bacteria ESCHERICHIA COLI  Final      Susceptibility   Escherichia coli - MIC*    AMPICILLIN 4 SENSITIVE Sensitive     CEFAZOLIN <=4 SENSITIVE Sensitive     CEFEPIME <=1 SENSITIVE Sensitive     CEFTAZIDIME <=1 SENSITIVE Sensitive     CEFTRIAXONE <=1 SENSITIVE Sensitive     CIPROFLOXACIN <=0.25 SENSITIVE Sensitive     GENTAMICIN <=1 SENSITIVE Sensitive     IMIPENEM <=0.25 SENSITIVE Sensitive     TRIMETH/SULFA <=20 SENSITIVE Sensitive     AMPICILLIN/SULBACTAM <=2 SENSITIVE Sensitive     PIP/TAZO <=4 SENSITIVE Sensitive     Extended ESBL NEGATIVE Sensitive     * RARE ESCHERICHIA COLI  Aerobic/Anaerobic Culture (surgical/deep wound)     Status: None   Collection Time: 08/08/16  3:31 PM  Result Value Ref Range Status   Specimen Description WOUND  Final   Special Requests RIGHT PLURAL SPACE  Final   Gram Stain   Final    FEW WBC PRESENT,BOTH PMN AND MONONUCLEAR NO ORGANISMS SEEN    Culture   Final    NO GROWTH 5 DAYS Performed at [facility], [address], [place],  [address]    Report Status 08/13/2016 FINAL  Final  Aerobic/Anaerobic Culture (surgical/deep wound)     Status: None   Collection Time: 08/08/16  3:31 PM  Result Value Ref Range Status   Specimen Description WOUND  Final   Special Requests LEFT PLERAL SPACE  Final   Gram Stain   Final    FEW WBC PRESENT,BOTH PMN AND MONONUCLEAR NO ORGANISMS SEEN    Culture   Final    MODERATE BACTEROIDES FRAGILIS BETA LACTAMASE POSITIVE Performed at [facility], [address] [place], [place] [address]    Report Status 08/13/2016 FINAL  Final  Fungus Culture With Stain     Status: None (Preliminary result)   Collection Time: 08/08/16  3:31 PM  Result Value Ref Range Status   Fungus Stain Final report  Final    Comment: (NOTE) Performed At: [facility] [place], [place] [id number] Lindon Romp MD [id number]    Fungus (Mycology) Culture PENDING  Incomplete   Fungal Source PLEURAL  Final    Comment: PLEURAL PEEL  Aerobic/Anaerobic Culture (surgical/deep wound)     Status: None   Collection Time: 08/08/16  3:31 PM  Result Value Ref Range Status   Specimen Description WOUND  Final   Special Requests PLEURAL PEEL LEFT  Final   Gram Stain   Final    FEW WBC PRESENT,BOTH PMN AND MONONUCLEAR NO ORGANISMS SEEN Performed at [facility], [address] [place], [place] [address]    Culture   Final    FEW BACTEROIDES FRAGILIS BETA LACTAMASE POSITIVE RARE CLOSTRIDIUM CADAVERIS    Report Status 08/13/2016 FINAL  Final  Fungus Culture Result     Status: None   Collection Time: 08/08/16  3:31 PM  Result Value Ref Range Status   Result 1 Comment  Final    Comment: (NOTE) KOH/Calcofluor preparation:  no fungus observed. Performed At: [facility] [place], [place] [id number] Lindon Romp MD [id number]   Fungus Culture Result     Status: None   Collection Time: 08/08/16  3:31 PM  Result Value Ref Range Status   Result 1 Comment  Final    Comment: (NOTE) KOH/Calcofluor preparation:  no fungus observed. Performed At: [facility] [place], [place] [id number] Lindon Romp MD [id number]   Fungus Culture Result     Status: None   Collection Time: 08/08/16  3:31 PM  Result Value Ref Range Status   Result 1 Comment  Final    Comment: (NOTE) KOH/Calcofluor preparation:  no fungus observed. Performed At: [facility] [place], [place]  [id number] Lindon Romp MD [id number]   Fungus Culture Result     Status: None   Collection Time: 08/08/16  3:31 PM  Result Value Ref Range Status   Result 1 Comment  Final    Comment: (NOTE) KOH/Calcofluor preparation:  no fungus observed. Performed At: [facility] [place], [place] [id number] Lindon Romp MD [id number]   Fungus Culture Result     Status: None   Collection Time: 08/08/16  3:31 PM  Result Value Ref Range Status   Result 1 Comment  Final    Comment: (NOTE) KOH/Calcofluor preparation:  no fungus observed. Performed At: [facility] [place], [place] [id number] Lindon Romp MD VV:[id number]   Fungal organism reflex     Status: None   Collection Time: 08/08/16  3:31 PM  Result Value Ref Range Status   Fungal result 1 Candida albicans  Final    Comment: (NOTE) Light growth Performed At: [facility] [place], [place] [id number] Lindon Romp MD QM:[id number]   Aerobic/Anaerobic Culture (surgical/deep wound)     Status: None (Preliminary result)   Collection Time: 08/12/16  8:34 AM  Result Value Ref Range Status   Specimen Description WOUND PERITONEAL SITE FOR WOUND VAC DRESSING  Final   Special Requests NONE  Final   Gram Stain   Final    FEW WBC PRESENT, PREDOMINANTLY PMN RARE GRAM VARIABLE COCCI    Culture   Final    CULTURE REINCUBATED FOR BETTER GROWTH Performed at [facility], [place] [address], [place], [place] [address]    Report Status PENDING  Incomplete    Medical History: History reviewed. No pertinent past medical history.  Medications:  Prescriptions Prior to Admission  Medication Sig Dispense Refill Last Dose  . ALPRAZolam (XANAX) 0.25 MG tablet Take 0.25 mg by mouth 3 (three) times daily as needed.   0 07/19/2016 at 0800  . FLUoxetine (PROZAC) 20 MG capsule Take 40 mg by mouth every morning.  0 07/19/2016 at 0800   Scheduled:  . [START ON  08/16/2016] anidulafungin  100 mg Intravenous Q24H  . anidulafungin  200 mg Intravenous Once  . enoxaparin (LOVENOX) injection  40 mg Subcutaneous Q24H  . feeding supplement (ENSURE ENLIVE)  237 mL Oral TID BM  . FLUoxetine  40 mg Oral q morning - 10a  . mouth rinse  15 mL Mouth Rinse BID  . pantoprazole (PROTONIX) IV  40 mg Intravenous Q12H  . piperacillin-tazobactam (ZOSYN)  IV  3.375 g Intravenous Q8H  . sodium chloride flush  10-40 mL Intracatheter Q12H   Assessment: Pharmacy consulted to dose and monitor Zosyn and Eraxis in this 51 year old female with intrabdominal infection post perforated peptic ulcer.   Goal of Therapy:    Plan:  Will start Zosyn 3.375 g IV q8 hours and will also give Eraxis 200 mg IV x 1 and then 100 mg IV q24 hours thereafter. Pharmacy to follow per consult.    Lindsey Wolf D 08/15/2016,2:28 PM

## 2016-08-15 NOTE — Progress Notes (Signed)
[place] INFECTIOUS DISEASE PROGRESS NOTE Date of Admission:  07/20/2016     ID: Lindsey Wolf is a 51 y.o. female with  intrabdominal infection post perforated peptic ulcer Active Problems:   Perforated ulcer ([place])   Abdominal abscess ([place])   Abnormal respirations   Pleural effusion   Respiratory distress   Empyema of left pleural space (HCC)   Subjective: No fevers, wbc up some. Feels a little stronger   ROS  Eleven systems are reviewed and negative except per hpi  Medications:  Antibiotics Given (last 72 hours)    Date/Time Action Medication Dose Rate   08/13/16 1000 Given   ertapenem (INVANZ) 1 g in sodium chloride 0.9 % 50 mL IVPB 1 g 100 mL/hr   08/14/16 0950 Given   ertapenem (INVANZ) 1 g in sodium chloride 0.9 % 50 mL IVPB 1 g 100 mL/hr   08/15/16 0951 Given   ertapenem (INVANZ) 1 g in sodium chloride 0.9 % 50 mL IVPB 1 g 100 mL/hr     . enoxaparin (LOVENOX) injection  40 mg Subcutaneous Q24H  . ertapenem  1 g Intravenous Q24H  . feeding supplement (ENSURE ENLIVE)  237 mL Oral TID BM  . fluconazole (DIFLUCAN) IV  400 mg Intravenous Q24H  . FLUoxetine  40 mg Oral q morning - 10a  . mouth rinse  15 mL Mouth Rinse BID  . pantoprazole (PROTONIX) IV  40 mg Intravenous Q12H  . sodium chloride flush  10-40 mL Intracatheter Q12H    Objective: Vital signs in last 24 hours: Temp:  [98 F (36.7 C)-98.7 F (37.1 C)] 98.1 F (36.7 C) (03/14 1211) Pulse Rate:  [93-106] 101 (03/14 1211) Resp:  [15-22] 15 (03/14 1211) BP: (107-116)/(60-66) 107/60 (03/14 1211) SpO2:  [95 %-100 %] 95 % (03/14 1211) Weight:  [56 kg (123 lb 8 oz)] 56 kg (123 lb 8 oz) (03/14 0425) Constitutional:  Ill appearing HENT: [place]/AT, PERRLA, no scleral icterus Mouth/Throat: Oropharynx is clear and dry. No oropharyngeal exudate.  Cardiovascular: Normal rate, regular rhythm and normal heart sounds.  Pulmonary/Chest: bil rhonchi Bil chest tubes with ss drainage Neck = supple, no nuchal  rigidity Abdominal: Soft. abd mildly distended, inciision along midline covered with wound vac 1 JP drains in place o  R - with purulent material, Lymphadenopathy: no cervical adenopathy. No axillary adenopathy Neurological: alert and oriented to person, place, and time.  Skin: Skin is warm and dry. No rash noted. No erythema.  PICC RUE wnl Foley cath in place Psychiatric: a normal mood and affect.  behavior is normal.   Lab Results  Recent Labs  08/13/16 0802 08/14/16 0544 08/15/16 0420  WBC  --  16.7* 26.8*  HGB  --  7.8* 8.5*  HCT  --  22.4* 25.8*  NA 135  --   --   K 3.5  --   --   CL 104  --   --   CO2 28  --   --   BUN 16  --   --   CREATININE <0.30*  --   --     Microbiology: Results for orders placed or performed during the hospital encounter of 07/20/16  [place] rt PCR ([place] only)     Status: None   Collection Time: 07/20/16  1:15 PM  Result Value Ref Range Status   Specimen source GC/Chlam ENDOCERVICAL  Final   Chlamydia Tr NOT DETECTED NOT DETECTED Final   N gonorrhoeae NOT DETECTED NOT DETECTED Final  Comment: (NOTE) 100  This methodology has not been evaluated in pregnant women or in 200  patients with a history of hysterectomy. 300 400  This methodology will not be performed on patients less than 21  years of age.   Wet prep, genital     Status: Abnormal   Collection Time: 07/20/16  1:15 PM  Result Value Ref Range Status   Yeast Wet Prep HPF POC NONE SEEN NONE SEEN Final   Trich, Wet Prep NONE SEEN NONE SEEN Final   Clue Cells Wet Prep HPF POC NONE SEEN NONE SEEN Final   WBC, Wet Prep HPF POC RARE (A) NONE SEEN Final   Sperm NONE SEEN  Final  Blood culture (routine x 2)     Status: None   Collection Time: 07/20/16  3:07 PM  Result Value Ref Range Status   Specimen Description BLOOD left forearm  Final   Special Requests   Final    BOTTLES DRAWN AEROBIC AND ANAEROBIC AER12ML ANA12ML   Culture NO GROWTH 5 DAYS  Final   Report Status  07/25/2016 FINAL  Final  MRSA PCR Screening     Status: None   Collection Time: 07/20/16  9:29 PM  Result Value Ref Range Status   MRSA by PCR NEGATIVE NEGATIVE Final    Comment:        The GeneXpert MRSA Assay (FDA approved for NASAL specimens only), is one component of a comprehensive MRSA colonization surveillance program. It is not intended to diagnose MRSA infection nor to guide or monitor treatment for MRSA infections.   Aerobic/Anaerobic Culture (surgical/deep wound)     Status: None   Collection Time: 07/25/16  3:00 PM  Result Value Ref Range Status   Specimen Description ABSCESS RIGHT ABDOMEN  Final   Special Requests NONE  Final   Gram Stain   Final    RARE WBC PRESENT, PREDOMINANTLY PMN NO ORGANISMS SEEN    Culture   Final    No growth aerobically or anaerobically. Performed at [facility], [place] [address], [place], [place] [address]    Report Status 07/31/2016 FINAL  Final  Body fluid culture     Status: None   Collection Time: 07/30/16 11:40 AM  Result Value Ref Range Status   Specimen Description PLEURAL  Final   Special Requests NONE  Final   Gram Stain   Final    MODERATE WBC PRESENT, PREDOMINANTLY PMN NO ORGANISMS SEEN    Culture   Final    No growth aerobically or anaerobically. Performed at [facility], [place] [address], [place], [place] [address]    Report Status 08/03/2016 FINAL  Final  Acid Fast Smear (AFB)     Status: None   Collection Time: 07/30/16 11:40 AM  Result Value Ref Range Status   AFB Specimen Processing Concentration  Final   Acid Fast Smear Negative  Final    Comment: (NOTE) Performed At: [facility] [place], [place] [id number] Lindon Romp MD [id number]    Source (AFB) PLEURAL  Final  Aerobic Culture (superficial specimen)     Status: None   Collection Time: 08/06/16  3:34 PM  Result Value Ref Range Status   Specimen Description ABDOMEN  Final   Special Requests Normal  Final    Gram Stain   Final    RARE WBC PRESENT,BOTH PMN AND MONONUCLEAR NO ORGANISMS SEEN Performed at [facility], [address], [place],  [address]  Culture RARE ESCHERICHIA COLI  Final   Report Status 08/09/2016 FINAL  Final   Organism ID, Bacteria ESCHERICHIA COLI  Final      Susceptibility   Escherichia coli - MIC*    AMPICILLIN <=2 SENSITIVE Sensitive     CEFAZOLIN <=4 SENSITIVE Sensitive     CEFEPIME <=1 SENSITIVE Sensitive     CEFTAZIDIME <=1 SENSITIVE Sensitive     CEFTRIAXONE <=1 SENSITIVE Sensitive     CIPROFLOXACIN <=0.25 SENSITIVE Sensitive     GENTAMICIN <=1 SENSITIVE Sensitive     IMIPENEM <=0.25 SENSITIVE Sensitive     TRIMETH/SULFA <=20 SENSITIVE Sensitive     AMPICILLIN/SULBACTAM <=2 SENSITIVE Sensitive     PIP/TAZO <=4 SENSITIVE Sensitive     Extended ESBL NEGATIVE Sensitive     * RARE ESCHERICHIA COLI  Fungus Culture With Stain     Status: None   Collection Time: 08/08/16  3:31 PM  Result Value Ref Range Status   Fungus Stain Final report  Final   Fungus (Mycology) Culture Preliminary report  Final    Comment: (NOTE) Performed At: [facility] [address] [place], [place] [id number] Lindon Romp MD [id number]    Fungal Source ABDOMEN  Final    Comment: ABDOMINAL WALL WOUND  GOES WITH ACC [id number]   Fungus Culture With Stain     Status: None (Preliminary result)   Collection Time: 08/08/16  3:31 PM  Result Value Ref Range Status   Fungus Stain Final report  Final    Comment: (NOTE) Performed At: [facility] [place], [place] [id number] Lindon Romp MD [id number]    Fungus (Mycology) Culture PENDING  Incomplete   Fungal Source ABDOMEN  Final    Comment: ABDOMINAL WALL 2  GOES WITH ACC [id number]   Fungus Culture With Stain     Status: None (Preliminary result)   Collection Time: 08/08/16  3:31 PM  Result Value Ref Range Status   Fungus Stain Final report  Final    Comment: (NOTE) Performed  At: [facility] [place], [place] [id number] Lindon Romp MD [id number]    Fungus (Mycology) Culture PENDING  Incomplete   Fungal Source PLEURAL  Final    Comment: RIGHT PLEURAL SPACE  Fungus Culture With Stain     Status: None (Preliminary result)   Collection Time: 08/08/16  3:31 PM  Result Value Ref Range Status   Fungus Stain Final report  Final    Comment: (NOTE) Performed At: [facility] [place], [place] [id number] Lindon Romp MD [id number]    Fungus (Mycology) Culture PENDING  Incomplete   Fungal Source PLEURAL  Final    Comment: LEFT PLEURAL SPACE  Aerobic/Anaerobic Culture (surgical/deep wound)     Status: None   Collection Time: 08/08/16  3:31 PM  Result Value Ref Range Status   Specimen Description WOUND  Final   Special Requests ABDOMINAL WALL WOUND  Final   Gram Stain   Final    ABUNDANT WBC PRESENT, PREDOMINANTLY PMN RARE GRAM POSITIVE COCCI IN PAIRS Performed at [facility], [address], [place], [place] [address]    Culture   Final    FEW ESCHERICHIA COLI RARE ACTINOMYCES ODONTOLYTICUS SUSCEPTIBILITIES PERFORMED ON PREVIOUS CULTURE WITHIN THE LAST 5 DAYS. MIXED ANAEROBIC FLORA PRESENT.  CALL LAB IF FURTHER IID REQUIRED.    Report Status 08/13/2016 FINAL  Final  Aerobic/Anaerobic Culture (surgical/deep wound)     Status: None  Collection Time: 08/08/16  3:31 PM  Result Value Ref Range Status   Specimen Description WOUND  Final   Special Requests ABDOMINAL WALL WOUND 2  Final   Gram Stain   Final    ABUNDANT WBC PRESENT, PREDOMINANTLY PMN RARE GRAM POSITIVE COCCI IN PAIRS FEW GRAM POSITIVE RODS    Culture   Final    RARE ESCHERICHIA COLI RARE ACTINOMYCES ODONTOLYTICUS FEW BACTEROIDES FRAGILIS RARE BACTEROIDES THETAIOTAOMICRON BETA LACTAMASE POSITIVE FOR BOTH BACTEROIDES SPECIES Performed at [facility], [place] [address] [place], [place] [address]    Report Status  08/13/2016 FINAL  Final   Organism ID, Bacteria ESCHERICHIA COLI  Final      Susceptibility   Escherichia coli - MIC*    AMPICILLIN 4 SENSITIVE Sensitive     CEFAZOLIN <=4 SENSITIVE Sensitive     CEFEPIME <=1 SENSITIVE Sensitive     CEFTAZIDIME <=1 SENSITIVE Sensitive     CEFTRIAXONE <=1 SENSITIVE Sensitive     CIPROFLOXACIN <=0.25 SENSITIVE Sensitive     GENTAMICIN <=1 SENSITIVE Sensitive     IMIPENEM <=0.25 SENSITIVE Sensitive     TRIMETH/SULFA <=20 SENSITIVE Sensitive     AMPICILLIN/SULBACTAM <=2 SENSITIVE Sensitive     PIP/TAZO <=4 SENSITIVE Sensitive     Extended ESBL NEGATIVE Sensitive     * RARE ESCHERICHIA COLI  Aerobic/Anaerobic Culture (surgical/deep wound)     Status: None   Collection Time: 08/08/16  3:31 PM  Result Value Ref Range Status   Specimen Description WOUND  Final   Special Requests RIGHT PLURAL SPACE  Final   Gram Stain   Final    FEW WBC PRESENT,BOTH PMN AND MONONUCLEAR NO ORGANISMS SEEN    Culture   Final    NO GROWTH 5 DAYS Performed at [facility], [address], [place], [place] [address]    Report Status 08/13/2016 FINAL  Final  Aerobic/Anaerobic Culture (surgical/deep wound)     Status: None   Collection Time: 08/08/16  3:31 PM  Result Value Ref Range Status   Specimen Description WOUND  Final   Special Requests LEFT PLERAL SPACE  Final   Gram Stain   Final    FEW WBC PRESENT,BOTH PMN AND MONONUCLEAR NO ORGANISMS SEEN    Culture   Final    MODERATE BACTEROIDES FRAGILIS BETA LACTAMASE POSITIVE Performed at [facility], [place] [address], [place], [place] [address]    Report Status 08/13/2016 FINAL  Final  Fungus Culture With Stain     Status: None (Preliminary result)   Collection Time: 08/08/16  3:31 PM  Result Value Ref Range Status   Fungus Stain Final report  Final    Comment: (NOTE) Performed At: [facility] [place], [place] [id number] Lindon Romp MD [id number]    Fungus (Mycology)  Culture PENDING  Incomplete   Fungal Source PLEURAL  Final    Comment: PLEURAL PEEL  Aerobic/Anaerobic Culture (surgical/deep wound)     Status: None   Collection Time: 08/08/16  3:31 PM  Result Value Ref Range Status   Specimen Description WOUND  Final   Special Requests PLEURAL PEEL LEFT  Final   Gram Stain   Final    FEW WBC PRESENT,BOTH PMN AND MONONUCLEAR NO ORGANISMS SEEN Performed at [facility], [address] [place], [place] [address]    Culture   Final    FEW BACTEROIDES FRAGILIS BETA LACTAMASE POSITIVE RARE CLOSTRIDIUM CADAVERIS    Report Status  08/13/2016 FINAL  Final  Fungus Culture Result     Status: None   Collection Time: 08/08/16  3:31 PM  Result Value Ref Range Status   Result 1 Comment  Final    Comment: (NOTE) KOH/Calcofluor preparation:  no fungus observed. Performed At: [facility] [place], [place] [id number] Lindon Romp MD [id number]   Fungus Culture Result     Status: None   Collection Time: 08/08/16  3:31 PM  Result Value Ref Range Status   Result 1 Comment  Final    Comment: (NOTE) KOH/Calcofluor preparation:  no fungus observed. Performed At: [facility] [place], [place] [id number] Lindon Romp MD [id number]   Fungus Culture Result     Status: None   Collection Time: 08/08/16  3:31 PM  Result Value Ref Range Status   Result 1 Comment  Final    Comment: (NOTE) KOH/Calcofluor preparation:  no fungus observed. Performed At: [facility] [place], [place] [id number] Lindon Romp MD [id number]   Fungus Culture Result     Status: None   Collection Time: 08/08/16  3:31 PM  Result Value Ref Range Status   Result 1 Comment  Final    Comment: (NOTE) KOH/Calcofluor preparation:  no fungus observed. Performed At: [facility] [place], [place] [id number] Lindon Romp MD [id number]   Fungus Culture Result     Status:  None   Collection Time: 08/08/16  3:31 PM  Result Value Ref Range Status   Result 1 Comment  Final    Comment: (NOTE) KOH/Calcofluor preparation:  no fungus observed. Performed At: [facility] [place], [place] [id number] Lindon Romp MD [id number]   Fungal organism reflex     Status: None   Collection Time: 08/08/16  3:31 PM  Result Value Ref Range Status   Fungal result 1 Candida albicans  Final    Comment: (NOTE) Light growth Performed At: [facility] [place], [place] [id number] Lindon Romp MD [id number]   Aerobic/Anaerobic Culture (surgical/deep wound)     Status: None (Preliminary result)   Collection Time: 08/12/16  8:34 AM  Result Value Ref Range Status   Specimen Description WOUND PERITONEAL SITE FOR WOUND VAC DRESSING  Final   Special Requests NONE  Final   Gram Stain   Final    FEW WBC PRESENT, PREDOMINANTLY PMN RARE GRAM VARIABLE COCCI    Culture   Final    CULTURE REINCUBATED FOR BETTER GROWTH Performed at [facility], [place] [address] [place], [place] [address]    Report Status PENDING  Incomplete    Studies/Results: Dg Chest 2 View  Result Date: 08/14/2016 CLINICAL DATA:  Follow-up empyema with chest tube treatment following thoracoscopy and debridement of abdominal wall abscess 6 days ago EXAM: CHEST  2 VIEW COMPARISON:  Portable chest x-ray of August 10, 2016 FINDINGS: The lungs are reasonably well inflated. There is a persistent tiny right apical pneumothorax which is smaller today. There is persistent increased density in the retrocardiac region on the left. There is partial obscuration of the left hemidiaphragm. The cardiac silhouette and pulmonary vascularity are normal. A chest tube on the right is stable with the tip in the medial cardio phrenic angle. On the left there are 3 chest tubes in place which are stable with 2 having their tips in the mid to upper lung and 1 having its tip in the  lateral costophrenic gutter. A drainage tube projects in the left upper quadrant of the abdomen. IMPRESSION: Interval improvement in the appearance of the pulmonary interstitium bilaterally. Persistent small bilateral pleural effusions with bibasilar subsegmental atelectasis. Persistent tiny right apical pneumothorax which has decreased in size since yesterday's study. Electronically Signed   By: Vedha Tercero  [place] M.D.   On: 08/14/2016 07:31    Assessment/Plan: Lindsey Wolf is a 51 y.o. female with perforated peptic ulcer, with complicated course since admission with placement of an IR drain and then repeat surgery 2/26. All cultures have been negative. She has been on zosyn since admission and started fluconazole 2/26.  Multiple drains in place.  PICC in place and on TPN.  Had L empyema noted and s/p decortication with about 500 cc pus aspirated with cx growing bacteroides, and clostridium. Wound cx with E coli, actinomyces,  Abd wound cx with E coli- sensitive to zosyn.  WBC down 39-> 16.7 after surgery Her R abd JP drain has some purulent drainage Was changed to ertapenem from zosyn.  Stopped fluconazole but cx now with Candida albicans  Recommendations I would change the ertapenem back tozosyn as ertapenem does not cover pseudomonas nor enterococcus two common pathogens in cases such as this Change fluconazole to eraxis given risk of resistance Continue management of chest tubes per Dr Genevive Bi and abd drains by surgery Thank you very much for the consult. Will follow with you.  Mahalie Kanner P   08/15/2016, 2:00 PM

## 2016-08-15 NOTE — Progress Notes (Signed)
08/15/2016  Subjective: No acute events overnight.  Right chest tube removed yesterday by Dr. Genevive Bi.  Left chest tubes remain in place.  Wound vac changed yesterday revealing healthy granulation tissue, without purulent drainage.  This morning, WBC elevated again.  Vital signs: Temp:  [98 F (36.7 C)-98.7 F (37.1 C)] 98.7 F (37.1 C) (03/14 0730) Pulse Rate:  [90-106] 95 (03/14 0730) Resp:  [16-22] 18 (03/14 0730) BP: (107-116)/(61-66) 116/66 (03/14 0730) SpO2:  [95 %-100 %] 95 % (03/14 0730) Weight:  [56 kg (123 lb 8 oz)] 56 kg (123 lb 8 oz) (03/14 0425)   Intake/Output: 03/13 0701 - 03/14 0700 In: 290 [P.O.:240; IV Piggyback:50] Out: 3205 [Urine:2800; Drains:325; Chest Tube:80] Last BM Date: 08/12/16  Physical Exam: Constitutional:  No acute distress Abdomen: soft, nondistended, appropriately tender to palpation.  Wound vac in place.  JP in place with seropurulent fluid.  Labs:   Recent Labs  08/14/16 0544 08/15/16 0420  WBC 16.7* 26.8*  HGB 7.8* 8.5*  HCT 22.4* 25.8*  PLT 837* 962*    Recent Labs  08/13/16 0802  NA 135  K 3.5  CL 104  CO2 28  GLUCOSE 109*  BUN 16  CREATININE <0.30*  CALCIUM 7.7*   No results for input(s): LABPROT, INR in the last 72 hours.  Imaging: No results found.  Assessment/Plan: 51 yo female s/p exploratory laparotomies and left thoracotomy.  --Most recent culture from 3/7 reveals light growth of candida albicans.  Will re-start fluconazole today.  This may have caused the elevation in WBC.  If WBC elevated tomorrow still, may need repeat CT scan. --Continue diet, ambulation. --L chest tubes per Dr. Genevive Bi. --Coordinating with CM/SW so patient may be able to go to funeral services for her mother.   Melvyn Neth, [place]

## 2016-08-15 NOTE — Progress Notes (Signed)
Still with limited mobility but she did sit in chair yesterday.  Wounds are clean and dry though there is a small amount of erythema at the anterior portion of the thoracotomy wound.  No drainage though  Drainage is serous from left chest tubes though there is some particulate material in tubes  Encouraged to use incentive spirometer and walk as much as possible  Left chest tubes placed to water seal  [place].

## 2016-08-16 ENCOUNTER — Inpatient Hospital Stay: Payer: BLUE CROSS/BLUE SHIELD

## 2016-08-16 LAB — CBC WITH DIFFERENTIAL/PLATELET
BASOS ABS: 0.1 10*3/uL (ref 0–0.1)
Basophils Relative: 0 %
Eosinophils Absolute: 0.1 10*3/uL (ref 0–0.7)
Eosinophils Relative: 1 %
HEMATOCRIT: 27.2 % — AB (ref 35.0–47.0)
Hemoglobin: 8.5 g/dL — ABNORMAL LOW (ref 12.0–16.0)
LYMPHS ABS: 1.1 10*3/uL (ref 1.0–3.6)
LYMPHS PCT: 4 %
MCH: 24.8 pg — ABNORMAL LOW (ref 26.0–34.0)
MCHC: 31.1 g/dL — ABNORMAL LOW (ref 32.0–36.0)
MCV: 79.7 fL — AB (ref 80.0–100.0)
MONO ABS: 1.2 10*3/uL — AB (ref 0.2–0.9)
Monocytes Relative: 5 %
NEUTROS ABS: 23.5 10*3/uL — AB (ref 1.4–6.5)
Neutrophils Relative %: 90 %
Platelets: 987 10*3/uL (ref 150–440)
RBC: 3.41 MIL/uL — AB (ref 3.80–5.20)
RDW: 24.6 % — ABNORMAL HIGH (ref 11.5–14.5)
WBC: 26 10*3/uL — AB (ref 3.6–11.0)

## 2016-08-16 LAB — CREATININE, SERUM

## 2016-08-16 MED ORDER — POLYETHYLENE GLYCOL 3350 17 G PO PACK
17.0000 g | PACK | Freq: Every day | ORAL | Status: DC
  Administered 2016-08-16 – 2016-08-31 (×8): 17 g via ORAL
  Filled 2016-08-16 (×17): qty 1

## 2016-08-16 MED ORDER — BISACODYL 10 MG RE SUPP: 10.0000 mg | Freq: Every day | RECTAL | Status: DC | PRN

## 2016-08-16 MED ORDER — IOPAMIDOL (ISOVUE-300) INJECTION 61%: 30.0000 mL | Freq: Once | INTRAVENOUS | Status: DC

## 2016-08-16 MED ORDER — NYSTATIN 100000 UNIT/ML MT SUSP
5.0000 mL | Freq: Four times a day (QID) | OROMUCOSAL | Status: DC
  Administered 2016-08-16 – 2016-09-14 (×87): 500000 [IU] via ORAL
  Filled 2016-08-16 (×97): qty 5

## 2016-08-16 MED ORDER — IOPAMIDOL (ISOVUE-300) INJECTION 61%
15.0000 mL | INTRAVENOUS | Status: AC
  Administered 2016-08-16: 15 mL via ORAL

## 2016-08-16 MED ORDER — IOPAMIDOL (ISOVUE-300) INJECTION 61%
75.0000 mL | Freq: Once | INTRAVENOUS | Status: AC | PRN
  Administered 2016-08-16: 75 mL via INTRAVENOUS

## 2016-08-16 NOTE — Progress Notes (Addendum)
Call received regarding transport for ordered imagining @ 0600.  RN informed caller of inability to transport patient to procedure; subsquently requested that they send someone to pick up/transfer patient.  RN consulted with Charge Nurse to verify, Camera operator concurred. Charge Nurse to follow up.

## 2016-08-16 NOTE — Progress Notes (Signed)
Patient transported to imaging by tech @ 04:40 to fulfill MD's request.

## 2016-08-16 NOTE — Progress Notes (Signed)
CXRay from today looks good with no pneumothorax or significant pleural effusion  Wounds look clean and dry  I have placed her chest tubes to an empy [facility] and will repeat the CXRay now.  If OK, will convert to "empyema tubes".  [employer].

## 2016-08-16 NOTE — Progress Notes (Signed)
PT Cancellation Note  Patient Details Name: Lindsey Wolf MRN: [id number] DOB: [birth year]   Cancelled Treatment:    Reason Eval/Treat Not Completed: Patient at procedure or test/unavailable\  Pt drinking contrast for CT.  Declined session at this time.  Will continue as appropriate.   Chesley Noon 08/16/2016, 12:37 PM

## 2016-08-16 NOTE — Progress Notes (Signed)
Pt expressed to RN that she thinks she is getting thrush in her mouth, RN assessed pt.'s mouth and there is no white coat over tongue, however, pt.'s tongue looks raw. Surgeon on call was notified of findings. Verbal order for nystatin mouth rinse four times daily. Will continue to monitor pt.  Kiela Shisler [employer]

## 2016-08-16 NOTE — Progress Notes (Signed)
08/16/2016  Subjective: No acute events overnight.  Patient's abx regimen changed by ID team to Invanz and Eraxis.  Patient's wbc remains elevated today.  No fevers. No bowel movement in a few days.  Vital signs: Temp:  [97.8 F (36.6 C)-98.1 F (36.7 C)] 97.8 F (36.6 C) (03/15 0400) Pulse Rate:  [89-101] 89 (03/15 0400) Resp:  [15-18] 18 (03/15 0400) BP: (107-115)/(60-67) 112/67 (03/15 0400) SpO2:  [95 %-96 %] 96 % (03/15 0400) Weight:  [53.7 kg (118 lb 6.4 oz)] 53.7 kg (118 lb 6.4 oz) (03/15 0300)   Intake/Output: 03/14 0701 - 03/15 0700 In: 620 [P.O.:360; IV Piggyback:260] Out: 2025 [Urine:1850; Drains:105; Chest Tube:70] Last BM Date: 08/11/16 (per pt)  Physical Exam: Constitutional:  No acute distress Abdomen:  Soft, nondistended, appropriately tender.  Wound vac in place to suction and no leak.  No erythema noted around incision.  JP drain with seropurulent fluid stable.  Labs:   Recent Labs  08/15/16 0420 08/16/16 0829  WBC 26.8* 26.0*  HGB 8.5* 8.5*  HCT 25.8* 27.2*  PLT 962* 987*    Recent Labs  08/16/16 0829  CREATININE <0.30*   No results for input(s): LABPROT, INR in the last 72 hours.  Imaging: Dg Chest 2 View  Result Date: 08/16/2016 CLINICAL DATA:  Check chest tubes EXAM: CHEST  2 VIEW COMPARISON:  08/16/2016 FINDINGS: Cardiac shadow is stable. The right lung remains well aerated with minimal basilar atelectasis. Postoperative changes are again seen on the left with multiple chest tubes in place. No pneumothorax is seen. Persistent left basilar changes are noted right PICC line is again seen and stable. IMPRESSION: No significant interval change from the prior exam. Electronically Signed   By: Inez Catalina M.D.   On: 08/16/2016 10:35   Dg Chest 2 View  Result Date: 08/16/2016 CLINICAL DATA:  Postoperative check. EXAM: CHEST  2 VIEW COMPARISON:  08/14/2016 FINDINGS: Normal heart size and pulmonary vascularity. Right PICC line with tip over the  cavoatrial junction region. No pneumothorax. Three left chest tubes. No visible residual or recurrent pneumothorax. Skin clips along the left chest wall. Surgical drain in the upper abdomen. Right chest tube has been removed. Linear atelectasis or scarring in the lung bases. IMPRESSION: Right PICC line, 3 left chest tubes, and abdominal drainage catheter in place. No evidence of residual pneumothorax. Atelectasis or linear fibrosis in the lung bases. Electronically Signed   By: Lucienne Capers M.D.   On: 08/16/2016 05:29    Assessment/Plan: 51 yo female s/p exploratory laparotomies and left thoracotomy  --given persistent elevated WBC, will obtain CT scan abdomen/pelvis to evaluate for any new abscess --continue IV Invanz and Eraxis per ID recs --wound vac change today --chest tube management per Dr Genevive Bi --discharge planning pending results of CT scan.   Melvyn Neth, [place]

## 2016-08-17 ENCOUNTER — Inpatient Hospital Stay: Payer: BLUE CROSS/BLUE SHIELD

## 2016-08-17 DIAGNOSIS — E43 Unspecified severe protein-calorie malnutrition: Secondary | ICD-10-CM | POA: Insufficient documentation

## 2016-08-17 HISTORY — DX: Unspecified severe protein-calorie malnutrition: E43

## 2016-08-17 LAB — CBC WITH DIFFERENTIAL/PLATELET
Basophils Absolute: 0.2 10*3/uL — ABNORMAL HIGH (ref 0–0.1)
Basophils Relative: 1 %
Eosinophils Absolute: 0.1 10*3/uL (ref 0–0.7)
Eosinophils Relative: 0 %
HEMATOCRIT: 27.1 % — AB (ref 35.0–47.0)
HEMOGLOBIN: 8.8 g/dL — AB (ref 12.0–16.0)
LYMPHS ABS: 1.3 10*3/uL (ref 1.0–3.6)
LYMPHS PCT: 4 %
MCH: 25.8 pg — AB (ref 26.0–34.0)
MCHC: 32.4 g/dL (ref 32.0–36.0)
MCV: 79.8 fL — ABNORMAL LOW (ref 80.0–100.0)
MONOS PCT: 6 %
Monocytes Absolute: 1.7 10*3/uL — ABNORMAL HIGH (ref 0.2–0.9)
NEUTROS PCT: 89 %
Neutro Abs: 26.9 10*3/uL — ABNORMAL HIGH (ref 1.4–6.5)
Platelets: 961 10*3/uL (ref 150–440)
RBC: 3.4 MIL/uL — ABNORMAL LOW (ref 3.80–5.20)
RDW: 25 % — ABNORMAL HIGH (ref 11.5–14.5)
WBC: 30.2 10*3/uL — ABNORMAL HIGH (ref 3.6–11.0)

## 2016-08-17 LAB — BASIC METABOLIC PANEL
Anion gap: 6 (ref 5–15)
BUN: 6 mg/dL (ref 6–20)
CHLORIDE: 100 mmol/L — AB (ref 101–111)
CO2: 30 mmol/L (ref 22–32)
CREATININE: 0.33 mg/dL — AB (ref 0.44–1.00)
Calcium: 7.5 mg/dL — ABNORMAL LOW (ref 8.9–10.3)
GFR calc Af Amer: >60 mL/min (ref >60)
GFR calc non Af Amer: >60 mL/min (ref >60)
Glucose, Bld: 83 mg/dL (ref 65–99)
POTASSIUM: 2.5 mmol/L — AB (ref 3.5–5.1)
Sodium: 136 mmol/L (ref 135–145)

## 2016-08-17 MED ORDER — FENTANYL CITRATE (PF) 100 MCG/2ML IJ SOLN
INTRAMUSCULAR | Status: AC
  Filled 2016-08-17: qty 4

## 2016-08-17 MED ORDER — MIDAZOLAM HCL 5 MG/5ML IJ SOLN
INTRAMUSCULAR | Status: AC | PRN
  Administered 2016-08-17: 0.5 mg via INTRAVENOUS
  Administered 2016-08-17 (×2): 1 mg via INTRAVENOUS

## 2016-08-17 MED ORDER — FENTANYL CITRATE (PF) 100 MCG/2ML IJ SOLN
INTRAMUSCULAR | Status: AC | PRN
  Administered 2016-08-17 (×2): 25 ug via INTRAVENOUS
  Administered 2016-08-17: 50 ug via INTRAVENOUS

## 2016-08-17 MED ORDER — MIDAZOLAM HCL 5 MG/5ML IJ SOLN
INTRAMUSCULAR | Status: AC
  Filled 2016-08-17: qty 5

## 2016-08-17 MED ORDER — SODIUM CHLORIDE 0.9 % IV SOLN
INTRAVENOUS | Status: DC
  Administered 2016-08-17: 10:00:00 via INTRAVENOUS

## 2016-08-17 MED ORDER — POTASSIUM CHLORIDE CRYS ER 20 MEQ PO TBCR
60.0000 meq | EXTENDED_RELEASE_TABLET | Freq: Once | ORAL | Status: AC
  Administered 2016-08-17: 60 meq via ORAL
  Filled 2016-08-17: qty 3

## 2016-08-17 MED ORDER — ENOXAPARIN SODIUM 40 MG/0.4ML ~~LOC~~ SOLN
40.0000 mg | SUBCUTANEOUS | Status: DC
  Administered 2016-08-18 – 2016-08-22 (×5): 40 mg via SUBCUTANEOUS
  Filled 2016-08-17 (×5): qty 0.4

## 2016-08-17 NOTE — Clinical Social Work Note (Signed)
Patient not yet medically ready to go to [facility]. Patient's BCBS authorization for SNF is good through 3/27. Shela Leff MSW,LCSW [phone]

## 2016-08-17 NOTE — Progress Notes (Signed)
Received alert from lab regarding critical value for Potassium of 2.5.  RN reported to MD on Call, received verbal order for 71meq of Potassium

## 2016-08-17 NOTE — Consult Note (Signed)
Pharmacist consulted to review PTA and Inpatient med lists for interrupted anticoagulation / antiplatelet orders. May resume prophylaxis drug orders and therapeutic drug orders with prior protocols.  Pt s/p chest tube placement- per consult risk is standard. Per protocol will reschedule prophylactic lovenox for tomorrow AM.  Ramond Dial, Pharm.D, BCPS Clinical Pharmacist

## 2016-08-17 NOTE — Progress Notes (Signed)
Infectious Disease Long Term IV Antibiotic Orders  Diagnosis: Empyema, Abd abscess  Culture results Results for orders placed or performed during the hospital encounter of 07/20/16  [place] rt PCR ([place] only)     Status: None   Collection Time: 07/20/16  1:15 PM  Result Value Ref Range Status   Specimen source GC/Chlam ENDOCERVICAL  Final   Chlamydia Tr NOT DETECTED NOT DETECTED Final   N gonorrhoeae NOT DETECTED NOT DETECTED Final    Comment: (NOTE) 100  This methodology has not been evaluated in pregnant women or in 200  patients with a history of hysterectomy. 300 400  This methodology will not be performed on patients less than 59  years of age.   Wet prep, genital     Status: Abnormal   Collection Time: 07/20/16  1:15 PM  Result Value Ref Range Status   Yeast Wet Prep HPF POC NONE SEEN NONE SEEN Final   Trich, Wet Prep NONE SEEN NONE SEEN Final   Clue Cells Wet Prep HPF POC NONE SEEN NONE SEEN Final   WBC, Wet Prep HPF POC RARE (A) NONE SEEN Final   Sperm NONE SEEN  Final  Blood culture (routine x 2)     Status: None   Collection Time: 07/20/16  3:07 PM  Result Value Ref Range Status   Specimen Description BLOOD left forearm  Final   Special Requests   Final    BOTTLES DRAWN AEROBIC AND ANAEROBIC AER12ML ANA12ML   Culture NO GROWTH 5 DAYS  Final   Report Status 07/25/2016 FINAL  Final  MRSA PCR Screening     Status: None   Collection Time: 07/20/16  9:29 PM  Result Value Ref Range Status   MRSA by PCR NEGATIVE NEGATIVE Final    Comment:        The GeneXpert MRSA Assay (FDA approved for NASAL specimens only), is one component of a comprehensive MRSA colonization surveillance program. It is not intended to diagnose MRSA infection nor to guide or monitor treatment for MRSA infections.   Aerobic/Anaerobic Culture (surgical/deep wound)     Status: None   Collection Time: 07/25/16  3:00 PM  Result Value Ref Range Status   Specimen Description ABSCESS RIGHT  ABDOMEN  Final   Special Requests NONE  Final   Gram Stain   Final    RARE WBC PRESENT, PREDOMINANTLY PMN NO ORGANISMS SEEN    Culture   Final    No growth aerobically or anaerobically. Performed at [facility], [place] [address], [place], [place] [address]    Report Status 07/31/2016 FINAL  Final  Body fluid culture     Status: None   Collection Time: 07/30/16 11:40 AM  Result Value Ref Range Status   Specimen Description PLEURAL  Final   Special Requests NONE  Final   Gram Stain   Final    MODERATE WBC PRESENT, PREDOMINANTLY PMN NO ORGANISMS SEEN    Culture   Final    No growth aerobically or anaerobically. Performed at [facility], [place] [address] [place], [place] [address]    Report Status 08/03/2016 FINAL  Final  Acid Fast Smear (AFB)     Status: None   Collection Time: 07/30/16 11:40 AM  Result Value Ref Range Status   AFB Specimen Processing Concentration  Final   Acid Fast Smear Negative  Final    Comment: (NOTE) Performed At: [facility] [address] [place], [place] [id number] Lindon Romp  MD [id number]    Source (AFB) PLEURAL  Final  Aerobic Culture (superficial specimen)     Status: None   Collection Time: 08/06/16  3:34 PM  Result Value Ref Range Status   Specimen Description ABDOMEN  Final   Special Requests Normal  Final   Gram Stain   Final    RARE WBC PRESENT,BOTH PMN AND MONONUCLEAR NO ORGANISMS SEEN Performed at [facility], [address] [place], [place] [address]    Culture RARE ESCHERICHIA COLI  Final   Report Status 08/09/2016 FINAL  Final   Organism ID, Bacteria ESCHERICHIA COLI  Final      Susceptibility   Escherichia coli - MIC*    AMPICILLIN <=2 SENSITIVE Sensitive     CEFAZOLIN <=4 SENSITIVE Sensitive     CEFEPIME <=1 SENSITIVE Sensitive     CEFTAZIDIME <=1 SENSITIVE Sensitive     CEFTRIAXONE <=1 SENSITIVE Sensitive     CIPROFLOXACIN <=0.25 SENSITIVE Sensitive     GENTAMICIN <=1 SENSITIVE  Sensitive     IMIPENEM <=0.25 SENSITIVE Sensitive     TRIMETH/SULFA <=20 SENSITIVE Sensitive     AMPICILLIN/SULBACTAM <=2 SENSITIVE Sensitive     PIP/TAZO <=4 SENSITIVE Sensitive     Extended ESBL NEGATIVE Sensitive     * RARE ESCHERICHIA COLI  Fungus Culture With Stain     Status: None   Collection Time: 08/08/16  3:31 PM  Result Value Ref Range Status   Fungus Stain Final report  Final   Fungus (Mycology) Culture Preliminary report  Final    Comment: (NOTE) Performed At: [facility] [address] [place], [place] [id number] Lindon Romp MD [id number]    Fungal Source ABDOMEN  Final    Comment: ABDOMINAL WALL WOUND  GOES WITH ACC Q30092   Fungus Culture With Stain     Status: None (Preliminary result)   Collection Time: 08/08/16  3:31 PM  Result Value Ref Range Status   Fungus Stain Final report  Final    Comment: (NOTE) Performed At: [facility] [place], [place] [id number] Lindon Romp MD [id number]    Fungus (Mycology) Culture PENDING  Incomplete   Fungal Source ABDOMEN  Final    Comment: ABDOMINAL WALL 2  GOES WITH ACC [id number]   Fungus Culture With Stain     Status: None (Preliminary result)   Collection Time: 08/08/16  3:31 PM  Result Value Ref Range Status   Fungus Stain Final report  Final    Comment: (NOTE) Performed At: [facility] [place], [place] [id number] Lindon Romp MD [id number]    Fungus (Mycology) Culture PENDING  Incomplete   Fungal Source PLEURAL  Final    Comment: RIGHT PLEURAL SPACE  Fungus Culture With Stain     Status: None (Preliminary result)   Collection Time: 08/08/16  3:31 PM  Result Value Ref Range Status   Fungus Stain Final report  Final    Comment: (NOTE) Performed At: [facility] [place], [place] [id number] Lindon Romp MD [id number]    Fungus (Mycology) Culture PENDING  Incomplete   Fungal Source PLEURAL  Final     Comment: LEFT PLEURAL SPACE  Aerobic/Anaerobic Culture (surgical/deep wound)     Status: None   Collection Time: 08/08/16  3:31 PM  Result Value Ref Range Status   Specimen Description WOUND  Final   Special Requests ABDOMINAL WALL WOUND  Final   Gram Stain   Final  ABUNDANT WBC PRESENT, PREDOMINANTLY PMN RARE GRAM POSITIVE COCCI IN PAIRS Performed at [facility], [employer] [address] [place], [place] [address]    Culture   Final    FEW ESCHERICHIA COLI RARE ACTINOMYCES ODONTOLYTICUS SUSCEPTIBILITIES PERFORMED ON PREVIOUS CULTURE WITHIN THE LAST 5 DAYS. MIXED ANAEROBIC FLORA PRESENT.  CALL LAB IF FURTHER IID REQUIRED.    Report Status 08/13/2016 FINAL  Final  Aerobic/Anaerobic Culture (surgical/deep wound)     Status: None   Collection Time: 08/08/16  3:31 PM  Result Value Ref Range Status   Specimen Description WOUND  Final   Special Requests ABDOMINAL WALL WOUND 2  Final   Gram Stain   Final    ABUNDANT WBC PRESENT, PREDOMINANTLY PMN RARE GRAM POSITIVE COCCI IN PAIRS FEW GRAM POSITIVE RODS    Culture   Final    RARE ESCHERICHIA COLI RARE ACTINOMYCES ODONTOLYTICUS FEW BACTEROIDES FRAGILIS RARE BACTEROIDES THETAIOTAOMICRON BETA LACTAMASE POSITIVE FOR BOTH BACTEROIDES SPECIES Performed at [facility], [place] [address] [place], [place] [address]    Report Status 08/13/2016 FINAL  Final   Organism ID, Bacteria ESCHERICHIA COLI  Final      Susceptibility   Escherichia coli - MIC*    AMPICILLIN 4 SENSITIVE Sensitive     CEFAZOLIN <=4 SENSITIVE Sensitive     CEFEPIME <=1 SENSITIVE Sensitive     CEFTAZIDIME <=1 SENSITIVE Sensitive     CEFTRIAXONE <=1 SENSITIVE Sensitive     CIPROFLOXACIN <=0.25 SENSITIVE Sensitive     GENTAMICIN <=1 SENSITIVE Sensitive     IMIPENEM <=0.25 SENSITIVE Sensitive     TRIMETH/SULFA <=20 SENSITIVE Sensitive     AMPICILLIN/SULBACTAM <=2 SENSITIVE Sensitive     PIP/TAZO <=4 SENSITIVE Sensitive     Extended ESBL NEGATIVE Sensitive     *  RARE ESCHERICHIA COLI  Aerobic/Anaerobic Culture (surgical/deep wound)     Status: None   Collection Time: 08/08/16  3:31 PM  Result Value Ref Range Status   Specimen Description WOUND  Final   Special Requests RIGHT PLURAL SPACE  Final   Gram Stain   Final    FEW WBC PRESENT,BOTH PMN AND MONONUCLEAR NO ORGANISMS SEEN    Culture   Final    NO GROWTH 5 DAYS Performed at [facility], [address], [place], [place] [address]    Report Status 08/13/2016 FINAL  Final  Aerobic/Anaerobic Culture (surgical/deep wound)     Status: None   Collection Time: 08/08/16  3:31 PM  Result Value Ref Range Status   Specimen Description WOUND  Final   Special Requests LEFT PLERAL SPACE  Final   Gram Stain   Final    FEW WBC PRESENT,BOTH PMN AND MONONUCLEAR NO ORGANISMS SEEN    Culture   Final    MODERATE BACTEROIDES FRAGILIS BETA LACTAMASE POSITIVE Performed at [facility], [place] [address] [place], [place] [address]    Report Status 08/13/2016 FINAL  Final  Fungus Culture With Stain     Status: None (Preliminary result)   Collection Time: 08/08/16  3:31 PM  Result Value Ref Range Status   Fungus Stain Final report  Final    Comment: (NOTE) Performed At: [facility] [place], [place] [id number] Lindon Romp MD [id number]    Fungus (Mycology) Culture PENDING  Incomplete   Fungal Source PLEURAL  Final    Comment: PLEURAL PEEL  Aerobic/Anaerobic Culture (surgical/deep wound)     Status: None   Collection Time:  08/08/16  3:31 PM  Result Value Ref Range Status   Specimen Description WOUND  Final   Special Requests PLEURAL PEEL LEFT  Final   Gram Stain   Final    FEW WBC PRESENT,BOTH PMN AND MONONUCLEAR NO ORGANISMS SEEN Performed at [facility], [place] [address], [place], [place] [address]    Culture   Final    FEW BACTEROIDES FRAGILIS BETA LACTAMASE POSITIVE RARE CLOSTRIDIUM CADAVERIS    Report Status 08/13/2016 FINAL  Final  Fungus  Culture Result     Status: None   Collection Time: 08/08/16  3:31 PM  Result Value Ref Range Status   Result 1 Comment  Final    Comment: (NOTE) KOH/Calcofluor preparation:  no fungus observed. Performed At: [facility] [place], [place] [id number] Lindon Romp MD [id number]:[id number]   Fungus Culture Result     Status: None   Collection Time: 08/08/16  3:31 PM  Result Value Ref Range Status   Result 1 Comment  Final    Comment: (NOTE) KOH/Calcofluor preparation:  no fungus observed. Performed At: [facility] [place], [place] [id number] Lindon Romp MD [id number]   Fungus Culture Result     Status: None   Collection Time: 08/08/16  3:31 PM  Result Value Ref Range Status   Result 1 Comment  Final    Comment: (NOTE) KOH/Calcofluor preparation:  no fungus observed. Performed At: [facility] [place], [place] [id number] Lindon Romp MD VP:[id number]   Fungus Culture Result     Status: None   Collection Time: 08/08/16  3:31 PM  Result Value Ref Range Status   Result 1 Comment  Final    Comment: (NOTE) KOH/Calcofluor preparation:  no fungus observed. Performed At: [facility] [place], [place] [id number] Lindon Romp MD [id number]   Fungus Culture Result     Status: None   Collection Time: 08/08/16  3:31 PM  Result Value Ref Range Status   Result 1 Comment  Final    Comment: (NOTE) KOH/Calcofluor preparation:  no fungus observed. Performed At: [facility] [place], [place] [id number] Lindon Romp MD FB:[id number]   Fungal organism reflex     Status: None   Collection Time: 08/08/16  3:31 PM  Result Value Ref Range Status   Fungal result 1 Candida albicans  Final    Comment: (NOTE) Light growth Performed At: [facility] [place], [place] [id number] Lindon Romp MD [id number]   Aerobic/Anaerobic Culture  (surgical/deep wound)     Status: None (Preliminary result)   Collection Time: 08/12/16  8:34 AM  Result Value Ref Range Status   Specimen Description WOUND PERITONEAL SITE FOR WOUND VAC DRESSING  Final   Special Requests NONE  Final   Gram Stain   Final    FEW WBC PRESENT, PREDOMINANTLY PMN RARE GRAM VARIABLE COCCI    Culture   Final    RARE ESCHERICHIA COLI HOLDING FOR POSSIBLE ANAEROBE Performed at [facility], [place] [address], [place], [place] [address]    Report Status PENDING  Incomplete   Organism ID, Bacteria ESCHERICHIA COLI  Final      Susceptibility   Escherichia coli - MIC*    AMPICILLIN >=32 RESISTANT Resistant     CEFAZOLIN >=64 RESISTANT Resistant     CEFEPIME <=1 SENSITIVE Sensitive     CEFTAZIDIME <=1 SENSITIVE Sensitive  CEFTRIAXONE <=1 SENSITIVE Sensitive     CIPROFLOXACIN <=0.25 SENSITIVE Sensitive     GENTAMICIN <=1 SENSITIVE Sensitive     IMIPENEM <=0.25 SENSITIVE Sensitive     TRIMETH/SULFA <=20 SENSITIVE Sensitive     AMPICILLIN/SULBACTAM >=32 RESISTANT Resistant     PIP/TAZO <=4 SENSITIVE Sensitive     Extended ESBL NEGATIVE Sensitive     * RARE ESCHERICHIA COLI      Allergies: No Known Allergies  Discharge antibiotics Zosyn 3.375  grams every 8 hours  PICC Care per protocol Labs weekly while on IV antibiotics      CBC w diff   Comprehensive met panel   Planned duration of antibiotics 3 weeks from 3/16  Stop date 09/07/16  Follow up clinic date1-2 weeks  FAX weekly labs to [phone]  Leonel Ramsay, MD

## 2016-08-17 NOTE — Procedures (Signed)
CT guided LEFT chest tube placement 30F placed to pleurevac 40ml thin cloudy yellow fluid aspirate sent for GS, C&S No complication No blood loss. See complete dictation in [facility].

## 2016-08-17 NOTE — Progress Notes (Signed)
ANTIBIOTIC/ANTIFUnGAL CONSULT NOTE - INITIAL  Pharmacy Consult for Zosyn and Eraxis Indication: intra-abdominal infection  No Known Allergies  Patient Measurements: Height: 5\' 10"  (177.8 cm) Weight: 125 lb 12.8 oz (57.1 kg) IBW/kg (Calculated) : 68.5 Adjusted Body Weight:   Vital Signs: Temp: 98.5 F (36.9 C) (03/16 1324) Temp Source: Oral (03/16 1324) BP: 113/68 (03/16 1324) Pulse Rate: 94 (03/16 1324) Intake/Output from previous day: 03/15 0701 - 03/16 0700 In: 402 [P.O.:360; IV Piggyback:42] Out: 2000 [Urine:2000] Intake/Output from this shift: Total I/O In: -  Out: 665 [Urine:600; Drains:5; Chest Tube:60]  Labs:  Recent Labs  08/15/16 0420 08/16/16 0829 08/17/16 0415  WBC 26.8* 26.0* 30.2*  HGB 8.5* 8.5* 8.8*  PLT 962* 987* 961*  CREATININE  --  <0.30* 0.33*   Estimated Creatinine Clearance: 76.7 mL/min (A) (by C-G formula based on SCr of 0.33 mg/dL (L)). No results for input(s): VANCOTROUGH, VANCOPEAK, VANCORANDOM, GENTTROUGH, GENTPEAK, GENTRANDOM, TOBRATROUGH, TOBRAPEAK, TOBRARND, AMIKACINPEAK, AMIKACINTROU, AMIKACIN in the last 72 hours.   Microbiology: Recent Results (from the past 720 hour(s))  Chlamydia/NGC rt PCR ([facility] only)     Status: None   Collection Time: 07/20/16  1:15 PM  Result Value Ref Range Status   Specimen source GC/Chlam ENDOCERVICAL  Final   Chlamydia Tr NOT DETECTED NOT DETECTED Final   N gonorrhoeae NOT DETECTED NOT DETECTED Final    Comment: (NOTE) 100  This methodology has not been evaluated in pregnant women or in 200  patients with a history of hysterectomy. 300 400  This methodology will not be performed on patients less than 17  years of age.   Wet prep, genital     Status: Abnormal   Collection Time: 07/20/16  1:15 PM  Result Value Ref Range Status   Yeast Wet Prep HPF POC NONE SEEN NONE SEEN Final   Trich, Wet Prep NONE SEEN NONE SEEN Final   Clue Cells Wet Prep HPF POC NONE SEEN NONE SEEN Final   WBC, Wet Prep HPF  POC RARE (A) NONE SEEN Final   Sperm NONE SEEN  Final  Blood culture (routine x 2)     Status: None   Collection Time: 07/20/16  3:07 PM  Result Value Ref Range Status   Specimen Description BLOOD left forearm  Final   Special Requests   Final    BOTTLES DRAWN AEROBIC AND ANAEROBIC AER12ML ANA12ML   Culture NO GROWTH 5 DAYS  Final   Report Status 07/25/2016 FINAL  Final  MRSA PCR Screening     Status: None   Collection Time: 07/20/16  9:29 PM  Result Value Ref Range Status   MRSA by PCR NEGATIVE NEGATIVE Final    Comment:        The GeneXpert MRSA Assay (FDA approved for NASAL specimens only), is one component of a comprehensive MRSA colonization surveillance program. It is not intended to diagnose MRSA infection nor to guide or monitor treatment for MRSA infections.   Aerobic/Anaerobic Culture (surgical/deep wound)     Status: None   Collection Time: 07/25/16  3:00 PM  Result Value Ref Range Status   Specimen Description ABSCESS RIGHT ABDOMEN  Final   Special Requests NONE  Final   Gram Stain   Final    RARE WBC PRESENT, PREDOMINANTLY PMN NO ORGANISMS SEEN    Culture   Final    No growth aerobically or anaerobically. Performed at [facility], [place] [address] [place],  [address]    Report Status 07/31/2016  FINAL  Final  Body fluid culture     Status: None   Collection Time: 07/30/16 11:40 AM  Result Value Ref Range Status   Specimen Description PLEURAL  Final   Special Requests NONE  Final   Gram Stain   Final    MODERATE WBC PRESENT, PREDOMINANTLY PMN NO ORGANISMS SEEN    Culture   Final    No growth aerobically or anaerobically. Performed at [facility], [place] [address], [place], [place] [address]    Report Status 08/03/2016 FINAL  Final  Acid Fast Smear (AFB)     Status: None   Collection Time: 07/30/16 11:40 AM  Result Value Ref Range Status   AFB Specimen Processing Concentration  Final   Acid Fast Smear Negative  Final    Comment:  (NOTE) Performed At: [facility] [place], [place] [id number] Lindon Romp MD [id number]:[id number]    Source (AFB) PLEURAL  Final  Aerobic Culture (superficial specimen)     Status: None   Collection Time: 08/06/16  3:34 PM  Result Value Ref Range Status   Specimen Description ABDOMEN  Final   Special Requests Normal  Final   Gram Stain   Final    RARE WBC PRESENT,BOTH PMN AND MONONUCLEAR NO ORGANISMS SEEN Performed at [facility], [address], [place], [place] [address]    Culture RARE ESCHERICHIA COLI  Final   Report Status 08/09/2016 FINAL  Final   Organism ID, Bacteria ESCHERICHIA COLI  Final      Susceptibility   Escherichia coli - MIC*    AMPICILLIN <=2 SENSITIVE Sensitive     CEFAZOLIN <=4 SENSITIVE Sensitive     CEFEPIME <=1 SENSITIVE Sensitive     CEFTAZIDIME <=1 SENSITIVE Sensitive     CEFTRIAXONE <=1 SENSITIVE Sensitive     CIPROFLOXACIN <=0.25 SENSITIVE Sensitive     GENTAMICIN <=1 SENSITIVE Sensitive     IMIPENEM <=0.25 SENSITIVE Sensitive     TRIMETH/SULFA <=20 SENSITIVE Sensitive     AMPICILLIN/SULBACTAM <=2 SENSITIVE Sensitive     PIP/TAZO <=4 SENSITIVE Sensitive     Extended ESBL NEGATIVE Sensitive     * RARE ESCHERICHIA COLI  Fungus Culture With Stain     Status: None   Collection Time: 08/08/16  3:31 PM  Result Value Ref Range Status   Fungus Stain Final report  Final   Fungus (Mycology) Culture Preliminary report  Final    Comment: (NOTE) Performed At: [facility] [address] [place], [place] [id number] Lindon Romp MD JG:[id number]    Fungal Source ABDOMEN  Final    Comment: ABDOMINAL WALL WOUND  GOES WITH ACC [id number]   Fungus Culture With Stain     Status: None (Preliminary result)   Collection Time: 08/08/16  3:31 PM  Result Value Ref Range Status   Fungus Stain Final report  Final    Comment: (NOTE) Performed At: [facility] [place], [place] [id number] Lindon Romp  MD [id number]    Fungus (Mycology) Culture PENDING  Incomplete   Fungal Source ABDOMEN  Final    Comment: ABDOMINAL WALL 2  GOES WITH ACC [id number]   Fungus Culture With Stain     Status: None (Preliminary result)   Collection Time: 08/08/16  3:31 PM  Result Value Ref Range Status   Fungus Stain Final report  Final    Comment: (NOTE) Performed At: [facility] [address] [place], [place] [id number] Darrel Hoover  F MD [id number]    Fungus (Mycology) Culture PENDING  Incomplete   Fungal Source PLEURAL  Final    Comment: RIGHT PLEURAL SPACE  Fungus Culture With Stain     Status: None (Preliminary result)   Collection Time: 08/08/16  3:31 PM  Result Value Ref Range Status   Fungus Stain Final report  Final    Comment: (NOTE) Performed At: [facility] [place], [place] [id number] Lindon Romp MD NO:[id number]    Fungus (Mycology) Culture PENDING  Incomplete   Fungal Source PLEURAL  Final    Comment: LEFT PLEURAL SPACE  Aerobic/Anaerobic Culture (surgical/deep wound)     Status: None   Collection Time: 08/08/16  3:31 PM  Result Value Ref Range Status   Specimen Description WOUND  Final   Special Requests ABDOMINAL WALL WOUND  Final   Gram Stain   Final    ABUNDANT WBC PRESENT, PREDOMINANTLY PMN RARE GRAM POSITIVE COCCI IN PAIRS Performed at [facility], [place] [address] [place], [place] [address]    Culture   Final    FEW ESCHERICHIA COLI RARE ACTINOMYCES ODONTOLYTICUS SUSCEPTIBILITIES PERFORMED ON PREVIOUS CULTURE WITHIN THE LAST 5 DAYS. MIXED ANAEROBIC FLORA PRESENT.  CALL LAB IF FURTHER IID REQUIRED.    Report Status 08/13/2016 FINAL  Final  Aerobic/Anaerobic Culture (surgical/deep wound)     Status: None   Collection Time: 08/08/16  3:31 PM  Result Value Ref Range Status   Specimen Description WOUND  Final   Special Requests ABDOMINAL WALL WOUND 2  Final   Gram Stain   Final    ABUNDANT WBC PRESENT, PREDOMINANTLY  PMN RARE GRAM POSITIVE COCCI IN PAIRS FEW GRAM POSITIVE RODS    Culture   Final    RARE ESCHERICHIA COLI RARE ACTINOMYCES ODONTOLYTICUS FEW BACTEROIDES FRAGILIS RARE BACTEROIDES THETAIOTAOMICRON BETA LACTAMASE POSITIVE FOR BOTH BACTEROIDES SPECIES Performed at [facility], [place] [address], [place], [place] [address]    Report Status 08/13/2016 FINAL  Final   Organism ID, Bacteria ESCHERICHIA COLI  Final      Susceptibility   Escherichia coli - MIC*    AMPICILLIN 4 SENSITIVE Sensitive     CEFAZOLIN <=4 SENSITIVE Sensitive     CEFEPIME <=1 SENSITIVE Sensitive     CEFTAZIDIME <=1 SENSITIVE Sensitive     CEFTRIAXONE <=1 SENSITIVE Sensitive     CIPROFLOXACIN <=0.25 SENSITIVE Sensitive     GENTAMICIN <=1 SENSITIVE Sensitive     IMIPENEM <=0.25 SENSITIVE Sensitive     TRIMETH/SULFA <=20 SENSITIVE Sensitive     AMPICILLIN/SULBACTAM <=2 SENSITIVE Sensitive     PIP/TAZO <=4 SENSITIVE Sensitive     Extended ESBL NEGATIVE Sensitive     * RARE ESCHERICHIA COLI  Aerobic/Anaerobic Culture (surgical/deep wound)     Status: None   Collection Time: 08/08/16  3:31 PM  Result Value Ref Range Status   Specimen Description WOUND  Final   Special Requests RIGHT PLURAL SPACE  Final   Gram Stain   Final    FEW WBC PRESENT,BOTH PMN AND MONONUCLEAR NO ORGANISMS SEEN    Culture   Final    NO GROWTH 5 DAYS Performed at [facility], [address] [place], [place] [address]    Report Status 08/13/2016 FINAL  Final  Aerobic/Anaerobic Culture (surgical/deep wound)     Status: None   Collection Time: 08/08/16  3:31 PM  Result Value Ref Range Status   Specimen Description WOUND  Final   Special  Requests LEFT PLERAL SPACE  Final   Gram Stain   Final    FEW WBC PRESENT,BOTH PMN AND MONONUCLEAR NO ORGANISMS SEEN    Culture   Final    MODERATE BACTEROIDES FRAGILIS BETA LACTAMASE POSITIVE Performed at [facility], [place] [address], [place], [place] [address]    Report Status  08/13/2016 FINAL  Final  Fungus Culture With Stain     Status: None (Preliminary result)   Collection Time: 08/08/16  3:31 PM  Result Value Ref Range Status   Fungus Stain Final report  Final    Comment: (NOTE) Performed At: [facility] [place], [place] [id number] Lindon Romp MD PI:[id number]    Fungus (Mycology) Culture PENDING  Incomplete   Fungal Source PLEURAL  Final    Comment: PLEURAL PEEL  Aerobic/Anaerobic Culture (surgical/deep wound)     Status: None   Collection Time: 08/08/16  3:31 PM  Result Value Ref Range Status   Specimen Description WOUND  Final   Special Requests PLEURAL PEEL LEFT  Final   Gram Stain   Final    FEW WBC PRESENT,BOTH PMN AND MONONUCLEAR NO ORGANISMS SEEN Performed at [facility], [address] [place], [place] [address]    Culture   Final    FEW BACTEROIDES FRAGILIS BETA LACTAMASE POSITIVE RARE CLOSTRIDIUM CADAVERIS    Report Status 08/13/2016 FINAL  Final  Fungus Culture Result     Status: None   Collection Time: 08/08/16  3:31 PM  Result Value Ref Range Status   Result 1 Comment  Final    Comment: (NOTE) KOH/Calcofluor preparation:  no fungus observed. Performed At: [facility] [place], [place] [id number] Lindon Romp MD DD:[id number]   Fungus Culture Result     Status: None   Collection Time: 08/08/16  3:31 PM  Result Value Ref Range Status   Result 1 Comment  Final    Comment: (NOTE) KOH/Calcofluor preparation:  no fungus observed. Performed At: [facility] [place], [place] [id number] Lindon Romp MD VV:[id number]   Fungus Culture Result     Status: None   Collection Time: 08/08/16  3:31 PM  Result Value Ref Range Status   Result 1 Comment  Final    Comment: (NOTE) KOH/Calcofluor preparation:  no fungus observed. Performed At: [facility] [place], [place] [id number] Lindon Romp MD VO:[id number]    Fungus Culture Result     Status: None   Collection Time: 08/08/16  3:31 PM  Result Value Ref Range Status   Result 1 Comment  Final    Comment: (NOTE) KOH/Calcofluor preparation:  no fungus observed. Performed At: [facility] [place], [place] [id number] Lindon Romp MD EL:[id number]   Fungus Culture Result     Status: None   Collection Time: 08/08/16  3:31 PM  Result Value Ref Range Status   Result 1 Comment  Final    Comment: (NOTE) KOH/Calcofluor preparation:  no fungus observed. Performed At: [facility] [place], [place] [id number] Lindon Romp MD [id number]   Fungal organism reflex     Status: None   Collection Time: 08/08/16  3:31 PM  Result Value Ref Range Status   Fungal result 1 Candida albicans  Final    Comment: (NOTE) Light growth Performed At: [facility] [address] [place], [place] [id number] Lindon Romp MD [id number]   Aerobic/Anaerobic Culture (surgical/deep  wound)     Status: None (Preliminary result)   Collection Time: 08/12/16  8:34 AM  Result Value Ref Range Status   Specimen Description WOUND PERITONEAL SITE FOR WOUND VAC DRESSING  Final   Special Requests NONE  Final   Gram Stain   Final    FEW WBC PRESENT, PREDOMINANTLY PMN RARE GRAM VARIABLE COCCI    Culture   Final    RARE ESCHERICHIA COLI HOLDING FOR POSSIBLE ANAEROBE Performed at [facility], [place] [address] [place], [place] [address]    Report Status PENDING  Incomplete   Organism ID, Bacteria ESCHERICHIA COLI  Final      Susceptibility   Escherichia coli - MIC*    AMPICILLIN >=32 RESISTANT Resistant     CEFAZOLIN >=64 RESISTANT Resistant     CEFEPIME <=1 SENSITIVE Sensitive     CEFTAZIDIME <=1 SENSITIVE Sensitive     CEFTRIAXONE <=1 SENSITIVE Sensitive     CIPROFLOXACIN <=0.25 SENSITIVE Sensitive     GENTAMICIN <=1 SENSITIVE Sensitive     IMIPENEM <=0.25 SENSITIVE Sensitive     TRIMETH/SULFA  <=20 SENSITIVE Sensitive     AMPICILLIN/SULBACTAM >=32 RESISTANT Resistant     PIP/TAZO <=4 SENSITIVE Sensitive     Extended ESBL NEGATIVE Sensitive     * RARE ESCHERICHIA COLI    Medical History: History reviewed. No pertinent past medical history.  Medications:  Prescriptions Prior to Admission  Medication Sig Dispense Refill Last Dose  . ALPRAZolam (XANAX) 0.25 MG tablet Take 0.25 mg by mouth 3 (three) times daily as needed.   0 07/19/2016 at 0800  . FLUoxetine (PROZAC) 20 MG capsule Take 40 mg by mouth every morning.  0 07/19/2016 at 0800   Scheduled:  . anidulafungin  100 mg Intravenous Q24H  . enoxaparin (LOVENOX) injection  40 mg Subcutaneous Q24H  . feeding supplement (ENSURE ENLIVE)  237 mL Oral TID BM  . fentaNYL      . FLUoxetine  40 mg Oral q morning - 10a  . mouth rinse  15 mL Mouth Rinse BID  . midazolam      . nystatin  5 mL Oral QID  . pantoprazole (PROTONIX) IV  40 mg Intravenous Q12H  . piperacillin-tazobactam (ZOSYN)  IV  3.375 g Intravenous Q8H  . polyethylene glycol  17 g Oral Daily  . sodium chloride flush  10-40 mL Intracatheter Q12H   Assessment: Pharmacy consulted to dose and monitor Zosyn and Eraxis in this 51 year old female with intrabdominal infection post perforated peptic ulcer.   Goal of Therapy:    Plan:  Continue Zosyn 3.375 g IV q8 hours and Eraxis 100 mg IV q24 hours. Pharmacy to follow per consult.  Will order CMP for the AM to monitor LFT with Eraxis.   Ramond Dial, Pharm.D, BCPS Clinical Pharmacist  08/17/2016,1:51 PM

## 2016-08-17 NOTE — Progress Notes (Signed)
[facility] INFECTIOUS DISEASE PROGRESS NOTE Date of Admission:  07/20/2016     ID: Lindsey Wolf is a 51 y.o. female with  intrabdominal infection post perforated peptic ulcer Active Problems:   Perforated ulcer ([place])   Abdominal abscess ([place])   Abnormal respirations   Pleural effusion   Respiratory distress   Empyema of left pleural space (HCC)   Protein-calorie malnutrition, severe   Subjective: WBC increased so repeat CT done and showed loculated pleural effusion on L so Drain placed with 25 cc yellow fluid obtained.   ROS  Eleven systems are reviewed and negative except per hpi  Medications:  Antibiotics Given (last 72 hours)    Date/Time Action Medication Dose Rate   08/15/16 0951 Given   ertapenem (INVANZ) 1 g in sodium chloride 0.9 % 50 mL IVPB 1 g 100 mL/hr   08/15/16 1522 Given   piperacillin-tazobactam (ZOSYN) IVPB 3.375 g 3.375 g 12.5 mL/hr   08/16/16 6811 Given   piperacillin-tazobactam (ZOSYN) IVPB 3.375 g 3.375 g 12.5 mL/hr   08/16/16 1450 Given   piperacillin-tazobactam (ZOSYN) IVPB 3.375 g 3.375 g 12.5 mL/hr   08/16/16 2256 Given   piperacillin-tazobactam (ZOSYN) IVPB 3.375 g 3.375 g 12.5 mL/hr   08/17/16 0533 Given   piperacillin-tazobactam (ZOSYN) IVPB 3.375 g 3.375 g 12.5 mL/hr   08/17/16 1511 Given   piperacillin-tazobactam (ZOSYN) IVPB 3.375 g 3.375 g 12.5 mL/hr     . anidulafungin  100 mg Intravenous Q24H  . [START ON 08/18/2016] enoxaparin (LOVENOX) injection  40 mg Subcutaneous Q24H  . feeding supplement (ENSURE ENLIVE)  237 mL Oral TID BM  . fentaNYL      . FLUoxetine  40 mg Oral q morning - 10a  . mouth rinse  15 mL Mouth Rinse BID  . midazolam      . nystatin  5 mL Oral QID  . pantoprazole (PROTONIX) IV  40 mg Intravenous Q12H  . piperacillin-tazobactam (ZOSYN)  IV  3.375 g Intravenous Q8H  . polyethylene glycol  17 g Oral Daily  . sodium chloride flush  10-40 mL Intracatheter Q12H    Objective: Vital signs in last 24  hours: Temp:  [97.5 F (36.4 C)-98.5 F (36.9 C)] 98.5 F (36.9 C) (03/16 1324) Pulse Rate:  [90-100] 94 (03/16 1324) Resp:  [14-27] 14 (03/16 1324) BP: (100-128)/(64-73) 113/68 (03/16 1324) SpO2:  [95 %-100 %] 97 % (03/16 1324) Weight:  [57.1 kg (125 lb 12.8 oz)] 57.1 kg (125 lb 12.8 oz) (03/16 0452) Constitutional:  Ill appearing HENT: [place]/AT, PERRLA, no scleral icterus Mouth/Throat: Oropharynx is clear and dry. No oropharyngeal exudate.  Cardiovascular: Normal rate, regular rhythm and normal heart sounds.  Pulmonary/Chest: bil rhonchi Bil chest tubes with ss drainage Neck = supple, no nuchal rigidity Abdominal: Soft. abd mildly distended, inciision along midline covered with wound vac 1 JP drains in place o  R - with purulent material, Lymphadenopathy: no cervical adenopathy. No axillary adenopathy Neurological: alert and oriented to person, place, and time.  Skin: Skin is warm and dry. No rash noted. No erythema.  PICC RUE wnl Foley cath in place Psychiatric: a normal mood and affect.  behavior is normal.   Lab Results  Recent Labs  08/16/16 0829 08/17/16 0415  WBC 26.0* 30.2*  HGB 8.5* 8.8*  HCT 27.2* 27.1*  NA  --  136  K  --  2.5*  CL  --  100*  CO2  --  30  BUN  --  6  CREATININE <0.30* 0.33*    Microbiology: Results for orders placed or performed during the hospital encounter of 07/20/16  [place] rt PCR ([place] only)     Status: None   Collection Time: 07/20/16  1:15 PM  Result Value Ref Range Status   Specimen source GC/Chlam ENDOCERVICAL  Final   Chlamydia Tr NOT DETECTED NOT DETECTED Final   N gonorrhoeae NOT DETECTED NOT DETECTED Final    Comment: (NOTE) 100  This methodology has not been evaluated in pregnant women or in 200  patients with a history of hysterectomy. 300 400  This methodology will not be performed on patients less than 20  years of age.   Wet prep, genital     Status: Abnormal   Collection Time: 07/20/16  1:15 PM  Result  Value Ref Range Status   Yeast Wet Prep HPF POC NONE SEEN NONE SEEN Final   Trich, Wet Prep NONE SEEN NONE SEEN Final   Clue Cells Wet Prep HPF POC NONE SEEN NONE SEEN Final   WBC, Wet Prep HPF POC RARE (A) NONE SEEN Final   Sperm NONE SEEN  Final  Blood culture (routine x 2)     Status: None   Collection Time: 07/20/16  3:07 PM  Result Value Ref Range Status   Specimen Description BLOOD left forearm  Final   Special Requests   Final    BOTTLES DRAWN AEROBIC AND ANAEROBIC AER12ML ANA12ML   Culture NO GROWTH 5 DAYS  Final   Report Status 07/25/2016 FINAL  Final  MRSA PCR Screening     Status: None   Collection Time: 07/20/16  9:29 PM  Result Value Ref Range Status   MRSA by PCR NEGATIVE NEGATIVE Final    Comment:        The GeneXpert MRSA Assay (FDA approved for NASAL specimens only), is one component of a comprehensive MRSA colonization surveillance program. It is not intended to diagnose MRSA infection nor to guide or monitor treatment for MRSA infections.   Aerobic/Anaerobic Culture (surgical/deep wound)     Status: None   Collection Time: 07/25/16  3:00 PM  Result Value Ref Range Status   Specimen Description ABSCESS RIGHT ABDOMEN  Final   Special Requests NONE  Final   Gram Stain   Final    RARE WBC PRESENT, PREDOMINANTLY PMN NO ORGANISMS SEEN    Culture   Final    No growth aerobically or anaerobically. Performed at [facility], [place] [address], [place], [place] [address]    Report Status 07/31/2016 FINAL  Final  Body fluid culture     Status: None   Collection Time: 07/30/16 11:40 AM  Result Value Ref Range Status   Specimen Description PLEURAL  Final   Special Requests NONE  Final   Gram Stain   Final    MODERATE WBC PRESENT, PREDOMINANTLY PMN NO ORGANISMS SEEN    Culture   Final    No growth aerobically or anaerobically. Performed at [facility], [place] [address], [place], [place] [address]    Report Status 08/03/2016 FINAL  Final  Acid Fast  Smear (AFB)     Status: None   Collection Time: 07/30/16 11:40 AM  Result Value Ref Range Status   AFB Specimen Processing Concentration  Final   Acid Fast Smear Negative  Final    Comment: (NOTE) Performed At: [facility] [place], [place] [id number] Lindon Romp MD [id number]    Source (AFB)  PLEURAL  Final  Aerobic Culture (superficial specimen)     Status: None   Collection Time: 08/06/16  3:34 PM  Result Value Ref Range Status   Specimen Description ABDOMEN  Final   Special Requests Normal  Final   Gram Stain   Final    RARE WBC PRESENT,BOTH PMN AND MONONUCLEAR NO ORGANISMS SEEN Performed at [facility], [address] [place],  [address]    Culture RARE ESCHERICHIA COLI  Final   Report Status 08/09/2016 FINAL  Final   Organism ID, Bacteria ESCHERICHIA COLI  Final      Susceptibility   Escherichia coli - MIC*    AMPICILLIN <=2 SENSITIVE Sensitive     CEFAZOLIN <=4 SENSITIVE Sensitive     CEFEPIME <=1 SENSITIVE Sensitive     CEFTAZIDIME <=1 SENSITIVE Sensitive     CEFTRIAXONE <=1 SENSITIVE Sensitive     CIPROFLOXACIN <=0.25 SENSITIVE Sensitive     GENTAMICIN <=1 SENSITIVE Sensitive     IMIPENEM <=0.25 SENSITIVE Sensitive     TRIMETH/SULFA <=20 SENSITIVE Sensitive     AMPICILLIN/SULBACTAM <=2 SENSITIVE Sensitive     PIP/TAZO <=4 SENSITIVE Sensitive     Extended ESBL NEGATIVE Sensitive     * RARE ESCHERICHIA COLI  Fungus Culture With Stain     Status: None   Collection Time: 08/08/16  3:31 PM  Result Value Ref Range Status   Fungus Stain Final report  Final   Fungus (Mycology) Culture Preliminary report  Final    Comment: (NOTE) Performed At: [facility] [address] [place], [place] [id number] Lindon Romp MD [id number]    Fungal Source ABDOMEN  Final    Comment: ABDOMINAL WALL WOUND  GOES WITH ACC [id number]   Fungus Culture With Stain     Status: None (Preliminary result)   Collection Time: 08/08/16   3:31 PM  Result Value Ref Range Status   Fungus Stain Final report  Final    Comment: (NOTE) Performed At: [facility] [place], [place] [id number] Lindon Romp MD GY:[id number]    Fungus (Mycology) Culture PENDING  Incomplete   Fungal Source ABDOMEN  Final    Comment: ABDOMINAL WALL 2  GOES WITH ACC [id number]   Fungus Culture With Stain     Status: None (Preliminary result)   Collection Time: 08/08/16  3:31 PM  Result Value Ref Range Status   Fungus Stain Final report  Final    Comment: (NOTE) Performed At: [facility] [place], [place] [id number] Lindon Romp MD [id number]:[id number]    Fungus (Mycology) Culture PENDING  Incomplete   Fungal Source PLEURAL  Final    Comment: RIGHT PLEURAL SPACE  Fungus Culture With Stain     Status: None (Preliminary result)   Collection Time: 08/08/16  3:31 PM  Result Value Ref Range Status   Fungus Stain Final report  Final    Comment: (NOTE) Performed At: [facility] [place], [place] [id number] Lindon Romp MD QH:[id number]    Fungus (Mycology) Culture PENDING  Incomplete   Fungal Source PLEURAL  Final    Comment: LEFT PLEURAL SPACE  Aerobic/Anaerobic Culture (surgical/deep wound)     Status: None   Collection Time: 08/08/16  3:31 PM  Result Value Ref Range Status   Specimen Description WOUND  Final   Special Requests ABDOMINAL WALL WOUND  Final   Gram Stain   Final    ABUNDANT WBC PRESENT, PREDOMINANTLY PMN RARE  GRAM POSITIVE COCCI IN PAIRS Performed at [facility], [place] [address], [place], [place] [address]    Culture   Final    FEW ESCHERICHIA COLI RARE ACTINOMYCES ODONTOLYTICUS SUSCEPTIBILITIES PERFORMED ON PREVIOUS CULTURE WITHIN THE LAST 5 DAYS. MIXED ANAEROBIC FLORA PRESENT.  CALL LAB IF FURTHER IID REQUIRED.    Report Status 08/13/2016 FINAL  Final  Aerobic/Anaerobic Culture (surgical/deep wound)     Status: None   Collection Time: 08/08/16   3:31 PM  Result Value Ref Range Status   Specimen Description WOUND  Final   Special Requests ABDOMINAL WALL WOUND 2  Final   Gram Stain   Final    ABUNDANT WBC PRESENT, PREDOMINANTLY PMN RARE GRAM POSITIVE COCCI IN PAIRS FEW GRAM POSITIVE RODS    Culture   Final    RARE ESCHERICHIA COLI RARE ACTINOMYCES ODONTOLYTICUS FEW BACTEROIDES FRAGILIS RARE BACTEROIDES THETAIOTAOMICRON BETA LACTAMASE POSITIVE FOR BOTH BACTEROIDES SPECIES Performed at [facility], [place] [address], [place], [place] [address]    Report Status 08/13/2016 FINAL  Final   Organism ID, Bacteria ESCHERICHIA COLI  Final      Susceptibility   Escherichia coli - MIC*    AMPICILLIN 4 SENSITIVE Sensitive     CEFAZOLIN <=4 SENSITIVE Sensitive     CEFEPIME <=1 SENSITIVE Sensitive     CEFTAZIDIME <=1 SENSITIVE Sensitive     CEFTRIAXONE <=1 SENSITIVE Sensitive     CIPROFLOXACIN <=0.25 SENSITIVE Sensitive     GENTAMICIN <=1 SENSITIVE Sensitive     IMIPENEM <=0.25 SENSITIVE Sensitive     TRIMETH/SULFA <=20 SENSITIVE Sensitive     AMPICILLIN/SULBACTAM <=2 SENSITIVE Sensitive     PIP/TAZO <=4 SENSITIVE Sensitive     Extended ESBL NEGATIVE Sensitive     * RARE ESCHERICHIA COLI  Aerobic/Anaerobic Culture (surgical/deep wound)     Status: None   Collection Time: 08/08/16  3:31 PM  Result Value Ref Range Status   Specimen Description WOUND  Final   Special Requests RIGHT PLURAL SPACE  Final   Gram Stain   Final    FEW WBC PRESENT,BOTH PMN AND MONONUCLEAR NO ORGANISMS SEEN    Culture   Final    NO GROWTH 5 DAYS Performed at [facility], [address] [place], [place] [address]    Report Status 08/13/2016 FINAL  Final  Aerobic/Anaerobic Culture (surgical/deep wound)     Status: None   Collection Time: 08/08/16  3:31 PM  Result Value Ref Range Status   Specimen Description WOUND  Final   Special Requests LEFT PLERAL SPACE  Final   Gram Stain   Final    FEW WBC PRESENT,BOTH PMN AND MONONUCLEAR NO  ORGANISMS SEEN    Culture   Final    MODERATE BACTEROIDES FRAGILIS BETA LACTAMASE POSITIVE Performed at [facility], [place] [address], [place], [place] [address]    Report Status 08/13/2016 FINAL  Final  Fungus Culture With Stain     Status: None (Preliminary result)   Collection Time: 08/08/16  3:31 PM  Result Value Ref Range Status   Fungus Stain Final report  Final    Comment: (NOTE) Performed At: [facility] [place], [place] [id number] Lindon Romp MD BT:[id number]    Fungus (Mycology) Culture PENDING  Incomplete   Fungal Source PLEURAL  Final    Comment: PLEURAL PEEL  Aerobic/Anaerobic Culture (surgical/deep wound)     Status: None   Collection Time: 08/08/16  3:31 PM  Result Value Ref Range Status   Specimen Description WOUND  Final   Special Requests PLEURAL PEEL LEFT  Final   Gram Stain   Final    FEW WBC PRESENT,BOTH PMN AND MONONUCLEAR NO ORGANISMS SEEN Performed at [facility], [address], [place], [place] [address]    Culture   Final    FEW BACTEROIDES FRAGILIS BETA LACTAMASE POSITIVE RARE CLOSTRIDIUM CADAVERIS    Report Status 08/13/2016 FINAL  Final  Fungus Culture Result     Status: None   Collection Time: 08/08/16  3:31 PM  Result Value Ref Range Status   Result 1 Comment  Final    Comment: (NOTE) KOH/Calcofluor preparation:  no fungus observed. Performed At: [facility] [place], [place] [id number] Lindon Romp MD [id number]   Fungus Culture Result     Status: None   Collection Time: 08/08/16  3:31 PM  Result Value Ref Range Status   Result 1 Comment  Final    Comment: (NOTE) KOH/Calcofluor preparation:  no fungus observed. Performed At: [facility] [place], [place] [id number] Lindon Romp MD [id number]   Fungus Culture Result     Status: None   Collection Time: 08/08/16  3:31 PM  Result Value Ref Range Status   Result 1 Comment  Final     Comment: (NOTE) KOH/Calcofluor preparation:  no fungus observed. Performed At: [facility] [place], [place] [id number] Lindon Romp MD WI:[id number]   Fungus Culture Result     Status: None   Collection Time: 08/08/16  3:31 PM  Result Value Ref Range Status   Result 1 Comment  Final    Comment: (NOTE) KOH/Calcofluor preparation:  no fungus observed. Performed At: [facility] [place], [place] [id number] Lindon Romp MD [id number]:[id number]   Fungus Culture Result     Status: None   Collection Time: 08/08/16  3:31 PM  Result Value Ref Range Status   Result 1 Comment  Final    Comment: (NOTE) KOH/Calcofluor preparation:  no fungus observed. Performed At: [facility] [place], [place] [id number] Lindon Romp MD QX:[id number]   Fungal organism reflex     Status: None   Collection Time: 08/08/16  3:31 PM  Result Value Ref Range Status   Fungal result 1 Candida albicans  Final    Comment: (NOTE) Light growth Performed At: [facility] [place], [place] [id number] Lindon Romp MD [id number]   Aerobic/Anaerobic Culture (surgical/deep wound)     Status: None (Preliminary result)   Collection Time: 08/12/16  8:34 AM  Result Value Ref Range Status   Specimen Description WOUND PERITONEAL SITE FOR WOUND VAC DRESSING  Final   Special Requests NONE  Final   Gram Stain   Final    FEW WBC PRESENT, PREDOMINANTLY PMN RARE GRAM VARIABLE COCCI    Culture   Final    RARE ESCHERICHIA COLI HOLDING FOR POSSIBLE ANAEROBE Performed at [facility], [place] [address] [place], [place] [address]    Report Status PENDING  Incomplete   Organism ID, Bacteria ESCHERICHIA COLI  Final      Susceptibility   Escherichia coli - MIC*    AMPICILLIN >=32 RESISTANT Resistant     CEFAZOLIN >=64 RESISTANT Resistant     CEFEPIME <=1 SENSITIVE Sensitive     CEFTAZIDIME <=1 SENSITIVE Sensitive      CEFTRIAXONE <=1 SENSITIVE Sensitive  CIPROFLOXACIN <=0.25 SENSITIVE Sensitive     GENTAMICIN <=1 SENSITIVE Sensitive     IMIPENEM <=0.25 SENSITIVE Sensitive     TRIMETH/SULFA <=20 SENSITIVE Sensitive     AMPICILLIN/SULBACTAM >=32 RESISTANT Resistant     PIP/TAZO <=4 SENSITIVE Sensitive     Extended ESBL NEGATIVE Sensitive     * RARE ESCHERICHIA COLI    Studies/Results: Dg Chest 2 View  Result Date: 08/16/2016 CLINICAL DATA:  Check chest tubes EXAM: CHEST  2 VIEW COMPARISON:  08/16/2016 FINDINGS: Cardiac shadow is stable. The right lung remains well aerated with minimal basilar atelectasis. Postoperative changes are again seen on the left with multiple chest tubes in place. No pneumothorax is seen. Persistent left basilar changes are noted right PICC line is again seen and stable. IMPRESSION: No significant interval change from the prior exam. Electronically Signed   By: Inez Catalina M.D.   On: 08/16/2016 10:35   Dg Chest 2 View  Result Date: 08/16/2016 CLINICAL DATA:  Postoperative check. EXAM: CHEST  2 VIEW COMPARISON:  08/14/2016 FINDINGS: Normal heart size and pulmonary vascularity. Right PICC line with tip over the cavoatrial junction region. No pneumothorax. Three left chest tubes. No visible residual or recurrent pneumothorax. Skin clips along the left chest wall. Surgical drain in the upper abdomen. Right chest tube has been removed. Linear atelectasis or scarring in the lung bases. IMPRESSION: Right PICC line, 3 left chest tubes, and abdominal drainage catheter in place. No evidence of residual pneumothorax. Atelectasis or linear fibrosis in the lung bases. Electronically Signed   By: Lucienne Capers M.D.   On: 08/16/2016 05:29   Ct Abdomen Pelvis W Contrast  Result Date: 08/16/2016 CLINICAL DATA:  Status post exploratory laparotomy with elevated white blood cell count EXAM: CT ABDOMEN AND PELVIS WITH CONTRAST TECHNIQUE: Multidetector CT imaging of the abdomen and pelvis was  performed using the standard protocol following bolus administration of intravenous contrast. CONTRAST:  59mL ISOVUE-300 IOPAMIDOL (ISOVUE-300) INJECTION 61% COMPARISON:  08/07/2016 FINDINGS: Lower chest: There is a residual air-fluid collection consistent with empyema in the left lung base. It is decreased in size when compared with the prior exam. Small right-sided pleural effusion is noted also improved from the prior study. Left lower lobe consolidation remains. Hepatobiliary: Hepatic cyst is again noted. The gallbladder is within normal limits. Pancreas: Unremarkable. No pancreatic ductal dilatation or surrounding inflammatory changes. Spleen: Subcapsular fluid collection is again noted along the medial aspect of the spleen stable from the prior exam measuring approximately 5.4 cm in greatest dimension. Adrenals/Urinary Tract: Adrenal glands are unremarkable. Kidneys are normal, without renal calculi, focal lesion, or hydronephrosis. Bladder is unremarkable. Stomach/Bowel: The appendix is not well visualized although no significant inflammatory changes are noted. The nasogastric catheter has been removed. A surgical drain extends into the lesser sac posterior to the stomach. No residual fluid collection is noted. Vascular/Lymphatic: Aortic atherosclerosis. No enlarged abdominal or pelvic lymph nodes. Reproductive: Uterus and bilateral adnexa are unremarkable. The air seen previously within the uterus has resolved. Other: Previously seen small fluid collection has resolved and now shows a small amount of air likely related to passage through the drainage catheter. Surgical drain is noted in subcutaneous tissues. Previously seen perihepatic fluid has reduced significantly in the interval as well. The previously seen left pelvic sidewall fluid collection has resolved in the interval. Musculoskeletal: No acute or significant osseous findings. IMPRESSION: Persistent air-fluid collection in the left lung base likely  representing an empyema. The indwelling chest tubes do not appear  to present to this fluid collection. Adjustment/replacement may be necessary. Small right pleural effusion remains but improved from the prior exam. Improvement in the degree of fluid collections within the abdomen as described. This includes perihepatic and anterior abdominal as well as left pelvic fluid collections. Drainage catheter in the lesser sac with improvement in the fluid collection in the lesser sac. Stable subcapsular splenic collection No new focal abnormality is seen. Electronically Signed   By: Inez Catalina M.D.   On: 08/16/2016 14:50   Ct Image Guided Drainage By Percutaneous Catheter  Result Date: 08/17/2016 CLINICAL DATA:  Empyema with 3 surgical chest tubes in place. Undrained loculated posterior collection at the left lung base. EXAM: CT GUIDED DRAINAGE OF LOCULATED LEFT PLEURAL COLLECTION ANESTHESIA/SEDATION: Intravenous Fentanyl and Versed were administered as conscious sedation during continuous monitoring of the patient's level of consciousness and physiological / cardiorespiratory status by the radiology RN, with a total moderate sedation time of 21 minutes. PROCEDURE: The procedure, risks, benefits, and alternatives were explained to the patient. Questions regarding the procedure were encouraged and answered. The patient understands and consents to the procedure. Patient placed in right lateral decubitus position. Select axial scans through the lower chest obtained an the loculated left cold pleural collection was localized. An appropriate skin entry site was determined and marked. The operative field was prepped with chlorhexidinein a sterile fashion, and a sterile drape was applied covering the operative field. A sterile gown and sterile gloves were used for the procedure. Local anesthesia was provided with 1% Lidocaine. Under CT fluoroscopic guidance, an 18 gauge trocar needle was advanced into the collection. Thin  cloudy yellow fluid could be aspirated. An Amplatz guidewire advanced easily within the collection, position confirmed on CT fluoro. Tract dilated to facilitate placement of a 12 French pigtail drain catheter, placed centrally within the collection. 25 mL of aspirate were sent for routine culture. The catheter was secured externally with 0 Prolene suture and StatLock, covered with Vaseline gauze and sterile gauze dressing, and placed to a Pleur-Evac device set -20 cm H2O suction. The patient tolerated the procedure well. COMPLICATIONS: None immediate FINDINGS: Loculated collection posteriorly at the left lung base in the pleural space with pleural thickening. The collection contains a few scattered gas bubbles. 3 surgical chest tubes are partially visualized. PICC line extends to the right atrium. Placement of 12 French pigtail drain catheter into loculated posterior left pleural collection as above. IMPRESSION: 1. Technically successful CT-guided drain catheter placement into loculated posterior left pleural collection. Electronically Signed   By: Lucrezia Europe M.D.   On: 08/17/2016 14:02    Assessment/Plan: TERAH ROBEY is a 51 y.o. female with perforated peptic ulcer, with complicated course since admission with placement of an IR drain and then repeat surgery 2/26. She has been on zosyn since admission and started fluconazole 2/26 - changed to eraxis 3/14.  Multiple drains in place.  PICC in place and on TPN.  Had L empyema noted and s/p decortication with about 500 cc pus aspirated with cx growing bacteroides, and clostridium and candida.  Wound cx with E coli, actinomyces. - E coli- sensitive to zosyn.  WBC down 39-> 16.7 after surgery but increased ad found to have loculated fluid on L chest  3/16 had repeat drain on L  Her R abd JP drain has some purulent drainage  Recommendations At this point I would favor several more weeks of IV zosyn (ertapenem does not cover pseudomonas nor enterococcus  two common pathogens  in cases such as this) I have placed abx order sheet for this. If dced to facility or home I can see in 7-10 days after dc.  Changed fluconazole to eraxis given risk of resistance but once ready for dc can change back to fluconazole 200 qd for another 2 weeks Continue management of chest tubes per Dr Genevive Bi and abd drains by surgery Thank you very much for the consult. Will follow with you.  Aitanna Haubner P   08/17/2016, 4:28 PM

## 2016-08-17 NOTE — Progress Notes (Signed)
Nutrition Follow-up  DOCUMENTATION CODES:   Severe malnutrition in context of acute illness/injury  INTERVENTION:  Continue Ensure Enlive po TID, each supplement provides 350 kcal and 20 grams of protein.  Reinforced importance of adequate calories and protein from meals and beverages. Reviewed menu with patient and discussed food options that will help patient meet calorie and protein needs.  Recommend initiation of calorie count on 3/19. If patient remains unable to meet calorie/protein needs with PO intake next week would recommend placement of small-bore NG tube for enteral nutrition.  NUTRITION DIAGNOSIS:   Inadequate oral intake related to inability to eat as evidenced by NPO status.  Ongoing inadequate intake even with diet advancement.   GOAL:   Patient will meet greater than or equal to 90% of their needs  Not met.   MONITOR:   I & O's, Diet advancement, Labs, Weight trends, Supplement acceptance  REASON FOR ASSESSMENT:   Malnutrition Screening Tool    ASSESSMENT:   Lindsey Wolf is a 51 y.o. female with a 3 days hx of abdominal pain in the epigastric area and she thinks it was a gas type. Pain is moderate to severe in intensity and has worsening today.  -Patient s/p CT guided left chest tube placement today (3/16).   Spoke with patient at bedside. She reports her appetite remains poor and it is also difficult to eat as she is going for several tests/procedures daily. She reports she enjoys the strawberry Ensure but only had a few sips of one yesterday and has not had any yet today. RN brought patient a strawberry Ensure during assessment. Discussed with patient that she will meet the criteria for malnutrition today and discussed the importance of adequate calories and protein for good nutritional status, adequate healing, and shorter length of stay. Patient amenable to try and drink all three Ensure daily and order food with adequate calorie and protein at each  meal. Patient seems to be growing tired of food on the menu as many of the options RD suggested to patient she reports is too dry or she does not like them. No BM for 5 days.  Meal Completion: 0-75%. Patient had 75% of dinner last night. She did not eat any lunch yesterday and has not had anything to eat today at all. Patient has had approximately 244 kcal (12% minimum estimated kcal needs) and 18 grams of protein (18% minimum estimated protein needs).  Medications reviewed and include: pantoprazole, Zosyn, Miralax.  Labs reviewed: Potassium 2.5, Chloride 100, Creatinine 0.33.  Weight trend: 54.7 kg 3/16 (-2.5 kg/4.2% body weight over one week, which is significant for time frame)  Patient is now day 5 of inadequate intake meeting <50% estimated energy needs (TPN was discontinued 3/11). Patient now meets criteria for severe acute malnutrition. Per initial nutrition assessment note patient had also lost 60 lbs over 1 year PTA, which was significant for time frame, as well.   Discussed with RN.   Diet Order:  DIET SOFT Room service appropriate? Yes; Fluid consistency: Thin  Skin:  Wound (see comment) (stage II pressure ulcer in nare)  Last BM:  08/12/2016  Height:   Ht Readings from Last 1 Encounters:  08/08/16 '5\' 10"'  (1.778 m)    Weight:   Wt Readings from Last 1 Encounters:  08/17/16 125 lb 12.8 oz (57.1 kg)    Ideal Body Weight:  68.18 kg  BMI:  Body mass index is 18.05 kg/m.  Estimated Nutritional Needs:   Kcal:  6803-2122  kcals  Protein:  102-136 g  Fluid:  >/= 2 L  EDUCATION NEEDS:   No education needs identified at this time  Willey Blade, MS, RD, LDN Pager: [phone] After Hours Pager: [phone]

## 2016-08-18 ENCOUNTER — Inpatient Hospital Stay: Payer: BLUE CROSS/BLUE SHIELD

## 2016-08-18 LAB — CBC WITH DIFFERENTIAL/PLATELET
BASOS PCT: 0 %
Basophils Absolute: 0.1 10*3/uL (ref 0–0.1)
EOS ABS: 0.1 10*3/uL (ref 0–0.7)
Eosinophils Relative: 1 %
HEMATOCRIT: 27.8 % — AB (ref 35.0–47.0)
HEMOGLOBIN: 8.8 g/dL — AB (ref 12.0–16.0)
Lymphocytes Relative: 4 %
Lymphs Abs: 0.9 10*3/uL — ABNORMAL LOW (ref 1.0–3.6)
MCH: 25.6 pg — ABNORMAL LOW (ref 26.0–34.0)
MCHC: 31.6 g/dL — ABNORMAL LOW (ref 32.0–36.0)
MCV: 81.1 fL (ref 80.0–100.0)
Monocytes Absolute: 1.4 10*3/uL — ABNORMAL HIGH (ref 0.2–0.9)
Monocytes Relative: 5 %
NEUTROS ABS: 23.6 10*3/uL — AB (ref 1.4–6.5)
NEUTROS PCT: 90 %
PLATELETS: 871 10*3/uL — AB (ref 150–440)
RBC: 3.43 MIL/uL — AB (ref 3.80–5.20)
RDW: 25.3 % — ABNORMAL HIGH (ref 11.5–14.5)
WBC: 26.2 10*3/uL — AB (ref 3.6–11.0)

## 2016-08-18 LAB — AEROBIC/ANAEROBIC CULTURE W GRAM STAIN (SURGICAL/DEEP WOUND)

## 2016-08-18 LAB — COMPREHENSIVE METABOLIC PANEL
ALT: 17 U/L (ref 14–54)
AST: 20 U/L (ref 15–41)
Albumin: 1.5 g/dL — ABNORMAL LOW (ref 3.5–5.0)
Alkaline Phosphatase: 135 U/L — ABNORMAL HIGH (ref 38–126)
Anion gap: 9 (ref 5–15)
BUN: 8 mg/dL (ref 6–20)
CO2: 29 mmol/L (ref 22–32)
CREATININE: 0.3 mg/dL — AB (ref 0.44–1.00)
Calcium: 7.6 mg/dL — ABNORMAL LOW (ref 8.9–10.3)
Chloride: 100 mmol/L — ABNORMAL LOW (ref 101–111)
Glucose, Bld: 70 mg/dL (ref 65–99)
Potassium: 2.7 mmol/L — CL (ref 3.5–5.1)
SODIUM: 138 mmol/L (ref 135–145)
Total Bilirubin: 0.5 mg/dL (ref 0.3–1.2)
Total Protein: 5.1 g/dL — ABNORMAL LOW (ref 6.5–8.1)

## 2016-08-18 LAB — AEROBIC/ANAEROBIC CULTURE (SURGICAL/DEEP WOUND)

## 2016-08-18 LAB — MAGNESIUM: MAGNESIUM: 1.7 mg/dL (ref 1.7–2.4)

## 2016-08-18 MED ORDER — POTASSIUM CHLORIDE CRYS ER 20 MEQ PO TBCR
40.0000 meq | EXTENDED_RELEASE_TABLET | ORAL | Status: AC
  Administered 2016-08-18 (×2): 40 meq via ORAL
  Filled 2016-08-18 (×2): qty 2

## 2016-08-18 NOTE — Progress Notes (Signed)
08/18/2016  Subjective: No acute events overnight.  Patient had a new left sided chest drain placed by IR yesterday due to persistent fluid over left chest that was not being drained by her current tubes.  Over the last 24 hrs, drain has put out 142 ml.  Reports pain over the left chest at the new drain insertion site.  Denies any abdominal pain.  Vital signs: Temp:  [98.3 F (36.8 C)-98.5 F (36.9 C)] 98.3 F (36.8 C) (03/17 0511) Pulse Rate:  [86-99] 86 (03/17 0511) Resp:  [14-27] 17 (03/17 0511) BP: (100-119)/(64-73) 110/64 (03/17 0511) SpO2:  [97 %-100 %] 98 % (03/17 0511) Weight:  [57.1 kg (125 lb 12.8 oz)] 57.1 kg (125 lb 12.8 oz) (03/17 0414)   Intake/Output: 03/16 0701 - 03/17 0700 In: 495 [P.O.:357; I.V.:10; IV Piggyback:128] Out: 1532 [Urine:1350; Drains:25; Chest Tube:157] Last BM Date: 08/17/16  Physical Exam: Constitutional:  No acute distress Pulm:  Left chest tubes in place.  Chest tubes to drainage bag, and new left chest drain to pleurvac suction.   Abdomen:  Labs:   Recent Labs  08/17/16 0415 08/18/16 0416  WBC 30.2* 26.2*  HGB 8.8* 8.8*  HCT 27.1* 27.8*  PLT 961* 871*    Recent Labs  08/17/16 0415 08/18/16 0416  NA 136 138  K 2.5* 2.7*  CL 100* 100*  CO2 30 29  GLUCOSE 83 70  BUN 6 8  CREATININE 0.33* 0.30*  CALCIUM 7.5* 7.6*   No results for input(s): LABPROT, INR in the last 72 hours.  Imaging: Ct Image Guided Drainage By Percutaneous Catheter  Result Date: 08/17/2016 CLINICAL DATA:  Empyema with 3 surgical chest tubes in place. Undrained loculated posterior collection at the left lung base. EXAM: CT GUIDED DRAINAGE OF LOCULATED LEFT PLEURAL COLLECTION ANESTHESIA/SEDATION: Intravenous Fentanyl and Versed were administered as conscious sedation during continuous monitoring of the patient's level of consciousness and physiological / cardiorespiratory status by the radiology RN, with a total moderate sedation time of 21 minutes. PROCEDURE: The  procedure, risks, benefits, and alternatives were explained to the patient. Questions regarding the procedure were encouraged and answered. The patient understands and consents to the procedure. Patient placed in right lateral decubitus position. Select axial scans through the lower chest obtained an the loculated left cold pleural collection was localized. An appropriate skin entry site was determined and marked. The operative field was prepped with chlorhexidinein a sterile fashion, and a sterile drape was applied covering the operative field. A sterile gown and sterile gloves were used for the procedure. Local anesthesia was provided with 1% Lidocaine. Under CT fluoroscopic guidance, an 18 gauge trocar needle was advanced into the collection. Thin cloudy yellow fluid could be aspirated. An Amplatz guidewire advanced easily within the collection, position confirmed on CT fluoro. Tract dilated to facilitate placement of a 12 French pigtail drain catheter, placed centrally within the collection. 25 mL of aspirate were sent for routine culture. The catheter was secured externally with 0 Prolene suture and StatLock, covered with Vaseline gauze and sterile gauze dressing, and placed to a Pleur-Evac device set -20 cm H2O suction. The patient tolerated the procedure well. COMPLICATIONS: None immediate FINDINGS: Loculated collection posteriorly at the left lung base in the pleural space with pleural thickening. The collection contains a few scattered gas bubbles. 3 surgical chest tubes are partially visualized. PICC line extends to the right atrium. Placement of 12 French pigtail drain catheter into loculated posterior left pleural collection as above. IMPRESSION: 1. Technically successful  CT-guided drain catheter placement into loculated posterior left pleural collection. Electronically Signed   By: Lucrezia Europe M.D.   On: 08/17/2016 14:02    Assessment/Plan: 51 yo female s/p two exploratory laparotomies for perforated  peptic ulcer and left thoracotomy for left chest empyema.  --Continue left chest IR drain to suction today as it's draining appropriately.  Continue the other chest tubes to gravity drainage.  Obtain new CXR today to evaluate her tubes --Wound vac change today. --Replete potassium 40 mEq x 2 doses today --Repeat CBC tomorrow to evaluate WBC.  Currently trending down again with change in abx and new drainage of left chest. --Possible discharge tomorrow depending on her WBC.  Discussed with ID yesterday regarding abx plan for outpatient and follow up.   Melvyn Neth, [place]

## 2016-08-18 NOTE — Progress Notes (Signed)
PT Cancellation Note  Patient Details Name: Lindsey Wolf MRN: [id number] DOB: Aug 14, 1965   Cancelled Treatment:    Reason Eval/Treat Not Completed: Patient not medically ready   Chart reviewed.  Potassium 2.7.  Will hold per PT protocols.  Continue as appropriate.   Chesley Noon 08/18/2016, 10:35 AM

## 2016-08-18 NOTE — Progress Notes (Signed)
OT Cancellation Note  Patient Details Name: Lindsey Wolf MRN: [id number] DOB: 04/07/1966   Cancelled Treatment:     Patient held this date due to medical status with potassium of 2.7.  Will monitor and continue as patient is medically stable for OT treatments.  Amy T Lovett, OTR/L, CLT   Lovett,Amy 08/18/2016, 11:00 AM

## 2016-08-19 LAB — BASIC METABOLIC PANEL
Anion gap: 5 (ref 5–15)
BUN: 5 mg/dL — ABNORMAL LOW (ref 6–20)
CHLORIDE: 103 mmol/L (ref 101–111)
CO2: 28 mmol/L (ref 22–32)
CREATININE: 0.32 mg/dL — AB (ref 0.44–1.00)
Calcium: 7.9 mg/dL — ABNORMAL LOW (ref 8.9–10.3)
GFR calc Af Amer: >60 mL/min (ref >60)
GFR calc non Af Amer: >60 mL/min (ref >60)
GLUCOSE: 94 mg/dL (ref 65–99)
POTASSIUM: 3.6 mmol/L (ref 3.5–5.1)
SODIUM: 136 mmol/L (ref 135–145)

## 2016-08-19 LAB — CBC
HCT: 31.4 % — ABNORMAL LOW (ref 35.0–47.0)
Hemoglobin: 9.7 g/dL — ABNORMAL LOW (ref 12.0–16.0)
MCH: 24.9 pg — AB (ref 26.0–34.0)
MCHC: 30.9 g/dL — AB (ref 32.0–36.0)
MCV: 80.6 fL (ref 80.0–100.0)
Platelets: 894 10*3/uL — ABNORMAL HIGH (ref 150–440)
RBC: 3.89 MIL/uL (ref 3.80–5.20)
RDW: 24.9 % — ABNORMAL HIGH (ref 11.5–14.5)
WBC: 20.8 10*3/uL — ABNORMAL HIGH (ref 3.6–11.0)

## 2016-08-19 NOTE — Plan of Care (Signed)
Problem: Fluid Volume: Goal: Ability to maintain a balanced intake and output will improve Outcome: Not Progressing Patient has a poor appetite.  Problem: Nutrition: Goal: Adequate nutrition will be maintained Outcome: Not Progressing Patient has poor appetite.

## 2016-08-19 NOTE — Progress Notes (Signed)
7 Days Post-Op   Subjective:  Patient reports feeling better today. Her only complaints of pain is from the newly placed IR drain. Otherwise she states she's tolerating a diet and having bowel function.  Vital signs in last 24 hours: Temp:  [97.8 F (36.6 C)-98.4 F (36.9 C)] 98.4 F (36.9 C) (03/18 0631) Pulse Rate:  [87-91] 87 (03/18 0631) Resp:  [20-24] 20 (03/18 0631) BP: (119)/(71-75) 119/75 (03/18 0631) SpO2:  [97 %-98 %] 98 % (03/18 0631) Weight:  [57.4 kg (126 lb 8 oz)] 57.4 kg (126 lb 8 oz) (03/18 0230) Last BM Date: 08/17/16  Intake/Output from previous day: 03/17 0701 - 03/18 0700 In: 68 [P.O.:360; IV Piggyback:50] Out: 8938 [Urine:1550; Drains:15; Chest Tube:72]  Gen.: No acute distress Chest: Clear to auscultation with multiple left-sided drains and left-sided thoracotomy Heart: Regular rhythm GI: Abdomen is soft, wound VAC in place to the midline, JP drain in the right lower quadrant with a seropurulent output.  Lab Results:  CBC  Recent Labs  08/18/16 0416 08/19/16 0825  WBC 26.2* 20.8*  HGB 8.8* 9.7*  HCT 27.8* 31.4*  PLT 871* 894*   CMP     Component Value Date/Time   NA 136 08/19/2016 0825   K 3.6 08/19/2016 0825   CL 103 08/19/2016 0825   CO2 28 08/19/2016 0825   GLUCOSE 94 08/19/2016 0825   BUN 5 (L) 08/19/2016 0825   CREATININE 0.32 (L) 08/19/2016 0825   CALCIUM 7.9 (L) 08/19/2016 0825   PROT 5.1 (L) 08/18/2016 0416   ALBUMIN 1.5 (L) 08/18/2016 0416   AST 20 08/18/2016 0416   ALT 17 08/18/2016 0416   ALKPHOS 135 (H) 08/18/2016 0416   BILITOT 0.5 08/18/2016 0416   GFRNONAA >60 08/19/2016 0825   GFRAA >60 08/19/2016 0825   PT/INR No results for input(s): LABPROT, INR in the last 72 hours.  Studies/Results: Dg Chest Port 1 View  Result Date: 08/18/2016 CLINICAL DATA:  51 year old female with chest pain and shortness of breath. She is 10 days status post left-sided VATS with decortication and insertion of 2 chest tubes and 1 day  status post placement of a pigtail drainage catheter into a posterior loculation of the empyema. EXAM: PORTABLE CHEST 1 VIEW COMPARISON:  Prior chest x-ray and CT scan 08/16/2016 FINDINGS: Right upper extremity PICC remains in unchanged position with the tip overlying the upper right atrium. There are 2 large bore right-sided chest tubes which remain in unchanged position in a single pigtail chest tube overlying the lung base. Overall, aeration at the left lung base has improved compared to the prior chest x-ray. There is no evidence of pneumothorax. Trace right pleural effusion persists. No acute osseous abnormality. IMPRESSION: 1. New pigtail drainage catheter projecting over the left lung base. Prior large-bore thoracostomy tubes remain in stable position. No pneumothorax. 2. Improved aeration of the left lung base likely secondary to decreased loculated pleural fluid and improving atelectasis. 3. Stable right basilar atelectasis. Electronically Signed   By: Jacqulynn Cadet M.D.   On: 08/18/2016 10:49   Ct Image Guided Drainage By Percutaneous Catheter  Result Date: 08/17/2016 CLINICAL DATA:  Empyema with 3 surgical chest tubes in place. Undrained loculated posterior collection at the left lung base. EXAM: CT GUIDED DRAINAGE OF LOCULATED LEFT PLEURAL COLLECTION ANESTHESIA/SEDATION: Intravenous Fentanyl and Versed were administered as conscious sedation during continuous monitoring of the patient's level of consciousness and physiological / cardiorespiratory status by the radiology RN, with a total moderate sedation time of 21  minutes. PROCEDURE: The procedure, risks, benefits, and alternatives were explained to the patient. Questions regarding the procedure were encouraged and answered. The patient understands and consents to the procedure. Patient placed in right lateral decubitus position. Select axial scans through the lower chest obtained an the loculated left cold pleural collection was localized. An  appropriate skin entry site was determined and marked. The operative field was prepped with chlorhexidinein a sterile fashion, and a sterile drape was applied covering the operative field. A sterile gown and sterile gloves were used for the procedure. Local anesthesia was provided with 1% Lidocaine. Under CT fluoroscopic guidance, an 18 gauge trocar needle was advanced into the collection. Thin cloudy yellow fluid could be aspirated. An Amplatz guidewire advanced easily within the collection, position confirmed on CT fluoro. Tract dilated to facilitate placement of a 12 French pigtail drain catheter, placed centrally within the collection. 25 mL of aspirate were sent for routine culture. The catheter was secured externally with 0 Prolene suture and StatLock, covered with Vaseline gauze and sterile gauze dressing, and placed to a Pleur-Evac device set -20 cm H2O suction. The patient tolerated the procedure well. COMPLICATIONS: None immediate FINDINGS: Loculated collection posteriorly at the left lung base in the pleural space with pleural thickening. The collection contains a few scattered gas bubbles. 3 surgical chest tubes are partially visualized. PICC line extends to the right atrium. Placement of 12 French pigtail drain catheter into loculated posterior left pleural collection as above. IMPRESSION: 1. Technically successful CT-guided drain catheter placement into loculated posterior left pleural collection. Electronically Signed   By: Lucrezia Europe M.D.   On: 08/17/2016 14:02    Assessment/Plan: 51 year old female now with a prolonged hospital stay secondary to perforated peptic ulcers. Numerous postoperative complications including intra-abdominal and intrathoracic abscesses. Clinically improving and doing well. White blood cell count has decreased markedly since placement of new drain into the chest. Encourage ambulation, incentive spirometer usage, oral intake.   Clayburn Pert, MD [facility]  Day [facility] [phone] Night [facility] [phone]  08/19/2016

## 2016-08-20 ENCOUNTER — Inpatient Hospital Stay: Payer: BLUE CROSS/BLUE SHIELD

## 2016-08-20 ENCOUNTER — Other Ambulatory Visit: Payer: Self-pay

## 2016-08-20 DIAGNOSIS — J90 Pleural effusion, not elsewhere classified: Secondary | ICD-10-CM

## 2016-08-20 LAB — BASIC METABOLIC PANEL
ANION GAP: 5 (ref 5–15)
BUN: 7 mg/dL (ref 6–20)
CALCIUM: 7.9 mg/dL — AB (ref 8.9–10.3)
CO2: 28 mmol/L (ref 22–32)
Chloride: 104 mmol/L (ref 101–111)
Glucose, Bld: 93 mg/dL (ref 65–99)
Potassium: 3.7 mmol/L (ref 3.5–5.1)
SODIUM: 137 mmol/L (ref 135–145)

## 2016-08-20 LAB — CBC
HCT: 29 % — ABNORMAL LOW (ref 35.0–47.0)
Hemoglobin: 9.5 g/dL — ABNORMAL LOW (ref 12.0–16.0)
MCH: 26.5 pg (ref 26.0–34.0)
MCHC: 32.6 g/dL (ref 32.0–36.0)
MCV: 81.1 fL (ref 80.0–100.0)
PLATELETS: 755 10*3/uL — AB (ref 150–440)
RBC: 3.58 MIL/uL — ABNORMAL LOW (ref 3.80–5.20)
RDW: 24 % — AB (ref 11.5–14.5)
WBC: 17.4 10*3/uL — AB (ref 3.6–11.0)

## 2016-08-20 MED ORDER — ENSURE SURGERY PO LIQD
237.0000 mL | Freq: Three times a day (TID) | ORAL | Status: DC
  Filled 2016-08-20: qty 237

## 2016-08-20 NOTE — Progress Notes (Signed)
ANTIBIOTIC/ANTIFUnGAL CONSULT NOTE - FOLLOW UP  Pharmacy Consult for Zosyn and Eraxis Indication: intra-abdominal infection  No Known Allergies  Patient Measurements: Height: 5\' 10"  (177.8 cm) Weight: 121 lb 4.8 oz (55 kg) IBW/kg (Calculated) : 68.5 Adjusted Body Weight:   Vital Signs: Temp: 98 F (36.7 C) (03/19 1245) Temp Source: Oral (03/19 1245) BP: 104/62 (03/19 1245) Pulse Rate: 87 (03/19 1245) Intake/Output from previous day: 03/18 0701 - 03/19 0700 In: 150 [P.O.:40; I.V.:10; IV Piggyback:100] Out: 925 [Urine:900; Drains:5; Chest Tube:20] Intake/Output from this shift: Total I/O In: -  Out: 250 [Urine:200; Chest Tube:50]  Labs:  Recent Labs  08/18/16 0416 08/19/16 0825 08/20/16 0423  WBC 26.2* 20.8* 17.4*  HGB 8.8* 9.7* 9.5*  PLT 871* 894* 755*  CREATININE 0.30* 0.32* <0.30*   CrCl cannot be calculated (This lab value cannot be used to calculate CrCl because it is not a number: <0.30). No results for input(s): VANCOTROUGH, VANCOPEAK, VANCORANDOM, GENTTROUGH, GENTPEAK, GENTRANDOM, TOBRATROUGH, TOBRAPEAK, TOBRARND, AMIKACINPEAK, AMIKACINTROU, AMIKACIN in the last 72 hours.   Microbiology: Recent Results (from the past 720 hour(s))  Aerobic/Anaerobic Culture (surgical/deep wound)     Status: None   Collection Time: 07/25/16  3:00 PM  Result Value Ref Range Status   Specimen Description ABSCESS RIGHT ABDOMEN  Final   Special Requests NONE  Final   Gram Stain   Final    RARE WBC PRESENT, PREDOMINANTLY PMN NO ORGANISMS SEEN    Culture   Final    No growth aerobically or anaerobically. Performed at [facility], [place] [address], [place], [place] [address]    Report Status 07/31/2016 FINAL  Final  Body fluid culture     Status: None   Collection Time: 07/30/16 11:40 AM  Result Value Ref Range Status   Specimen Description PLEURAL  Final   Special Requests NONE  Final   Gram Stain   Final    MODERATE WBC PRESENT, PREDOMINANTLY PMN NO ORGANISMS  SEEN    Culture   Final    No growth aerobically or anaerobically. Performed at [facility], [place] [address] [place], [place] [address]    Report Status 08/03/2016 FINAL  Final  Acid Fast Smear (AFB)     Status: None   Collection Time: 07/30/16 11:40 AM  Result Value Ref Range Status   AFB Specimen Processing Concentration  Final   Acid Fast Smear Negative  Final    Comment: (NOTE) Performed At: [facility] [place], [place] [id number] Lindon Romp MD [id number]    Source (AFB) PLEURAL  Final  Aerobic Culture (superficial specimen)     Status: None   Collection Time: 08/06/16  3:34 PM  Result Value Ref Range Status   Specimen Description ABDOMEN  Final   Special Requests Normal  Final   Gram Stain   Final    RARE WBC PRESENT,BOTH PMN AND MONONUCLEAR NO ORGANISMS SEEN Performed at [facility], [address], [place], [place] [address]    Culture RARE ESCHERICHIA COLI  Final   Report Status 08/09/2016 FINAL  Final   Organism ID, Bacteria ESCHERICHIA COLI  Final      Susceptibility   Escherichia coli - MIC*    AMPICILLIN <=2 SENSITIVE Sensitive     CEFAZOLIN <=4 SENSITIVE Sensitive     CEFEPIME <=1 SENSITIVE Sensitive     CEFTAZIDIME <=1 SENSITIVE Sensitive     CEFTRIAXONE <=1 SENSITIVE Sensitive     CIPROFLOXACIN <=0.25 SENSITIVE  Sensitive     GENTAMICIN <=1 SENSITIVE Sensitive     IMIPENEM <=0.25 SENSITIVE Sensitive     TRIMETH/SULFA <=20 SENSITIVE Sensitive     AMPICILLIN/SULBACTAM <=2 SENSITIVE Sensitive     PIP/TAZO <=4 SENSITIVE Sensitive     Extended ESBL NEGATIVE Sensitive     * RARE ESCHERICHIA COLI  Fungus Culture With Stain     Status: None   Collection Time: 08/08/16  3:31 PM  Result Value Ref Range Status   Fungus Stain Final report  Final   Fungus (Mycology) Culture Preliminary report  Final    Comment: (NOTE) Performed At: [facility] [address] [place], [place] [id number] Lindon Romp MD  [id number]    Fungal Source ABDOMEN  Final    Comment: ABDOMINAL WALL WOUND  GOES WITH ACC [id number]   Fungus Culture With Stain     Status: None (Preliminary result)   Collection Time: 08/08/16  3:31 PM  Result Value Ref Range Status   Fungus Stain Final report  Final    Comment: (NOTE) Performed At: [facility] [place], [place] [id number] Lindon Romp MD [id number]    Fungus (Mycology) Culture PENDING  Incomplete   Fungal Source ABDOMEN  Final    Comment: ABDOMINAL WALL 2  GOES WITH ACC [id number]   Fungus Culture With Stain     Status: None (Preliminary result)   Collection Time: 08/08/16  3:31 PM  Result Value Ref Range Status   Fungus Stain Final report  Final    Comment: (NOTE) Performed At: [facility] [place], [place] [id number] Lindon Romp MD [id number]    Fungus (Mycology) Culture PENDING  Incomplete   Fungal Source PLEURAL  Final    Comment: RIGHT PLEURAL SPACE  Fungus Culture With Stain     Status: None (Preliminary result)   Collection Time: 08/08/16  3:31 PM  Result Value Ref Range Status   Fungus Stain Final report  Final    Comment: (NOTE) Performed At: [facility] [place], [place] [id number] Lindon Romp MD [id number]    Fungus (Mycology) Culture PENDING  Incomplete   Fungal Source PLEURAL  Final    Comment: LEFT PLEURAL SPACE  Aerobic/Anaerobic Culture (surgical/deep wound)     Status: None   Collection Time: 08/08/16  3:31 PM  Result Value Ref Range Status   Specimen Description WOUND  Final   Special Requests ABDOMINAL WALL WOUND  Final   Gram Stain   Final    ABUNDANT WBC PRESENT, PREDOMINANTLY PMN RARE GRAM POSITIVE COCCI IN PAIRS Performed at [facility], [place] [address], [place], [place] [address]    Culture   Final    FEW ESCHERICHIA COLI RARE ACTINOMYCES ODONTOLYTICUS SUSCEPTIBILITIES PERFORMED ON PREVIOUS CULTURE WITHIN THE LAST 5  DAYS. MIXED ANAEROBIC FLORA PRESENT.  CALL LAB IF FURTHER IID REQUIRED.    Report Status 08/13/2016 FINAL  Final  Aerobic/Anaerobic Culture (surgical/deep wound)     Status: None   Collection Time: 08/08/16  3:31 PM  Result Value Ref Range Status   Specimen Description WOUND  Final   Special Requests ABDOMINAL WALL WOUND 2  Final   Gram Stain   Final    ABUNDANT WBC PRESENT, PREDOMINANTLY PMN RARE GRAM POSITIVE COCCI IN PAIRS FEW GRAM POSITIVE RODS    Culture   Final    RARE ESCHERICHIA COLI RARE ACTINOMYCES ODONTOLYTICUS FEW BACTEROIDES FRAGILIS RARE BACTEROIDES THETAIOTAOMICRON BETA LACTAMASE POSITIVE FOR  BOTH BACTEROIDES SPECIES Performed at [facility], [place] [address], [place], [place] [address]    Report Status 08/13/2016 FINAL  Final   Organism ID, Bacteria ESCHERICHIA COLI  Final      Susceptibility   Escherichia coli - MIC*    AMPICILLIN 4 SENSITIVE Sensitive     CEFAZOLIN <=4 SENSITIVE Sensitive     CEFEPIME <=1 SENSITIVE Sensitive     CEFTAZIDIME <=1 SENSITIVE Sensitive     CEFTRIAXONE <=1 SENSITIVE Sensitive     CIPROFLOXACIN <=0.25 SENSITIVE Sensitive     GENTAMICIN <=1 SENSITIVE Sensitive     IMIPENEM <=0.25 SENSITIVE Sensitive     TRIMETH/SULFA <=20 SENSITIVE Sensitive     AMPICILLIN/SULBACTAM <=2 SENSITIVE Sensitive     PIP/TAZO <=4 SENSITIVE Sensitive     Extended ESBL NEGATIVE Sensitive     * RARE ESCHERICHIA COLI  Aerobic/Anaerobic Culture (surgical/deep wound)     Status: None   Collection Time: 08/08/16  3:31 PM  Result Value Ref Range Status   Specimen Description WOUND  Final   Special Requests RIGHT PLURAL SPACE  Final   Gram Stain   Final    FEW WBC PRESENT,BOTH PMN AND MONONUCLEAR NO ORGANISMS SEEN    Culture   Final    NO GROWTH 5 DAYS Performed at [facility], [address] [place], [place] [address]    Report Status 08/13/2016 FINAL  Final  Aerobic/Anaerobic Culture (surgical/deep wound)     Status: None   Collection  Time: 08/08/16  3:31 PM  Result Value Ref Range Status   Specimen Description WOUND  Final   Special Requests LEFT PLERAL SPACE  Final   Gram Stain   Final    FEW WBC PRESENT,BOTH PMN AND MONONUCLEAR NO ORGANISMS SEEN    Culture   Final    MODERATE BACTEROIDES FRAGILIS BETA LACTAMASE POSITIVE Performed at [facility], [place] [address], [place], [place] [address]    Report Status 08/13/2016 FINAL  Final  Fungus Culture With Stain     Status: None (Preliminary result)   Collection Time: 08/08/16  3:31 PM  Result Value Ref Range Status   Fungus Stain Final report  Final    Comment: (NOTE) Performed At: [facility] [place], [place] [id number] Lindon Romp MD [id number]    Fungus (Mycology) Culture PENDING  Incomplete   Fungal Source PLEURAL  Final    Comment: PLEURAL PEEL  Aerobic/Anaerobic Culture (surgical/deep wound)     Status: None   Collection Time: 08/08/16  3:31 PM  Result Value Ref Range Status   Specimen Description WOUND  Final   Special Requests PLEURAL PEEL LEFT  Final   Gram Stain   Final    FEW WBC PRESENT,BOTH PMN AND MONONUCLEAR NO ORGANISMS SEEN Performed at [facility], [address], [place], [place] [address]    Culture   Final    FEW BACTEROIDES FRAGILIS BETA LACTAMASE POSITIVE RARE CLOSTRIDIUM CADAVERIS    Report Status 08/13/2016 FINAL  Final  Fungus Culture Result     Status: None   Collection Time: 08/08/16  3:31 PM  Result Value Ref Range Status   Result 1 Comment  Final    Comment: (NOTE) KOH/Calcofluor preparation:  no fungus observed. Performed At: [facility] [place], [place] [id number] Lindon Romp MD [id number]   Fungus Culture Result     Status: None   Collection Time: 08/08/16  3:31 PM  Result Value Ref Range Status   Result 1 Comment  Final    Comment: (NOTE) KOH/Calcofluor preparation:  no fungus observed. Performed At: [facility] [place], [place] [id number] Lindon Romp MD [id number]   Fungus Culture Result     Status: None   Collection Time: 08/08/16  3:31 PM  Result Value Ref Range Status   Result 1 Comment  Final    Comment: (NOTE) KOH/Calcofluor preparation:  no fungus observed. Performed At: [facility] [place], [place] [id number] Lindon Romp MD [id number]   Fungus Culture Result     Status: None   Collection Time: 08/08/16  3:31 PM  Result Value Ref Range Status   Result 1 Comment  Final    Comment: (NOTE) KOH/Calcofluor preparation:  no fungus observed. Performed At: [facility] [place], [place] [id number] Lindon Romp MD DX:[id number]   Fungus Culture Result     Status: None   Collection Time: 08/08/16  3:31 PM  Result Value Ref Range Status   Result 1 Comment  Final    Comment: (NOTE) KOH/Calcofluor preparation:  no fungus observed. Performed At: [facility] [place], [place] [id number] Lindon Romp MD [id number]   Fungal organism reflex     Status: None   Collection Time: 08/08/16  3:31 PM  Result Value Ref Range Status   Fungal result 1 Candida albicans  Final    Comment: (NOTE) Light growth Performed At: [facility] [place], [place] [id number] Lindon Romp MD [id number]   Aerobic/Anaerobic Culture (surgical/deep wound)     Status: None   Collection Time: 08/12/16  8:34 AM  Result Value Ref Range Status   Specimen Description WOUND PERITONEAL SITE FOR WOUND VAC DRESSING  Final   Special Requests NONE  Final   Gram Stain   Final    FEW WBC PRESENT, PREDOMINANTLY PMN RARE GRAM VARIABLE COCCI Performed at [facility], [place] [address], [place], [place] [address]    Culture   Final    RARE ESCHERICHIA COLI MIXED ANAEROBIC FLORA PRESENT.  CALL LAB IF FURTHER IID REQUIRED.    Report Status 08/18/2016 FINAL  Final   Organism ID, Bacteria  ESCHERICHIA COLI  Final      Susceptibility   Escherichia coli - MIC*    AMPICILLIN >=32 RESISTANT Resistant     CEFAZOLIN >=64 RESISTANT Resistant     CEFEPIME <=1 SENSITIVE Sensitive     CEFTAZIDIME <=1 SENSITIVE Sensitive     CEFTRIAXONE <=1 SENSITIVE Sensitive     CIPROFLOXACIN <=0.25 SENSITIVE Sensitive     GENTAMICIN <=1 SENSITIVE Sensitive     IMIPENEM <=0.25 SENSITIVE Sensitive     TRIMETH/SULFA <=20 SENSITIVE Sensitive     AMPICILLIN/SULBACTAM >=32 RESISTANT Resistant     PIP/TAZO <=4 SENSITIVE Sensitive     Extended ESBL NEGATIVE Sensitive     * RARE ESCHERICHIA COLI  Aerobic/Anaerobic Culture (surgical/deep wound)     Status: None (Preliminary result)   Collection Time: 08/17/16 12:30 PM  Result Value Ref Range Status   Specimen Description DRAINAGE  Final   Special Requests Normal  Final   Gram Stain   Final    FEW WBC PRESENT, PREDOMINANTLY PMN RARE GRAM POSITIVE COCCI IN PAIRS    Culture   Final    FEW BACTEROIDES FRAGILIS BETA LACTAMASE POSITIVE Performed at [facility], [address] [place], [place] [address]  Report Status PENDING  Incomplete    Medical History: History reviewed. No pertinent past medical history.  Medications:  Prescriptions Prior to Admission  Medication Sig Dispense Refill Last Dose  . ALPRAZolam (XANAX) 0.25 MG tablet Take 0.25 mg by mouth 3 (three) times daily as needed.   0 07/19/2016 at 0800  . FLUoxetine (PROZAC) 20 MG capsule Take 40 mg by mouth every morning.  0 07/19/2016 at 0800   Scheduled:  . anidulafungin  100 mg Intravenous Q24H  . enoxaparin (LOVENOX) injection  40 mg Subcutaneous Q24H  . feeding supplement (ENSURE ENLIVE)  237 mL Oral TID BM  . FLUoxetine  40 mg Oral q morning - 10a  . mouth rinse  15 mL Mouth Rinse BID  . nystatin  5 mL Oral QID  . pantoprazole (PROTONIX) IV  40 mg Intravenous Q12H  . piperacillin-tazobactam (ZOSYN)  IV  3.375 g Intravenous Q8H  . polyethylene glycol  17 g Oral Daily  .  sodium chloride flush  10-40 mL Intracatheter Q12H   Assessment: Pharmacy consulted to dose and monitor Zosyn and Eraxis in this 51 year old female with intrabdominal infection post perforated peptic ulcer.   Goal of Therapy:    Plan:  Continue Zosyn 3.375 g IV q8 hours and Eraxis 100 mg IV q24 hours. Pharmacy to follow per consult.    Larene Beach, PharmD  Clinical Pharmacist  08/20/2016,1:49 PM

## 2016-08-20 NOTE — Progress Notes (Signed)
s/p perforated ulcer and laparotomy x 2 Empyema s/p thoracotomy and decortication s/p placement of new CT by IR  Main issue is deconditioning Unfortunately was not able to do PT ( low K?) WBC trending down AVSS CXR no new collections, no PTX  PE NAD Chest CTA, no airleak from pigtail, empyema tubes connected to a foley Abd: wound vac in place, JP some seropurulent fluid, no enteric contents  A/P Doing well Will need prolonged A/Bs, zosyn and antifungal  Aggressive PT and nutritional support Place a new order for PT ( Chronic low K not a contra indication for physical therapy Placement later in the week

## 2016-08-20 NOTE — Plan of Care (Signed)
Problem: Activity: Goal: Risk for activity intolerance will decrease Outcome: Progressing Pt ambulated around nurses station once today with RN.

## 2016-08-20 NOTE — Progress Notes (Signed)
Physical Therapy Treatment Patient Details Name: Lindsey Wolf MRN: [id number] DOB: 1965/06/10 Today's Date: 08/20/2016    History of Present Illness Pt is a 51 year old female status post complicated repair of peptic ulcer disease. She later underwent a repeat laparotomy for drainage of intra-abdominal abscesses. Pt diagnosed with Acute hypoxic respiratory failure with L chest tube placement, septic shock with hypotension, and A-fib with RVR.  Pt also has multiple JP drains and was intubated but is now extubated. Pt underwent L thoracotomy and thoracoscopy along with debridement of abdominal wall abscess on 08/08/16.  Pt now with B chest tubes, JP drains, and wound vac. Of note, small pneumothorax per chart review.  Patient underwent wound vac placement and debridement of abd wound 08/12/16. Pt now has several chest tubes draining to foley bag and additional chest tube to wall suction.    PT Comments    Pt appears motivated to perform therapy. Pt willing to ambulate, however distance limited secondary to chest tube to wall suction. RN paged MD, however no response while in session. Pt able to transfer with decreased assist this date, still need +2 for lines/leads. Improved strength noted as well as increased ambulation distance performed. Slight report of discomfort with movement. Will continue to progress. Per MD, would like as much PT as possible.   Follow Up Recommendations  SNF     Equipment Recommendations  Rolling walker with 5" wheels    Recommendations for Other Services       Precautions / Restrictions Precautions Precautions: Fall Restrictions Weight Bearing Restrictions: No    Mobility  Bed Mobility Overal bed mobility: Needs Assistance Bed Mobility: Supine to Sit     Supine to sit: Min assist     General bed mobility comments: assist for trunk flexion and scooting out towards EOB. Once seated at EOB, pt able to sit with upright posture.  Transfers Overall  transfer level: Needs assistance Equipment used: Rolling walker (2 wheeled) Transfers: Sit to/from Stand Sit to Stand: Min assist         General transfer comment: assist for pushing from seated surface. Once standing, pt with upright posture and able to perform pre gait activities with min assist  Ambulation/Gait Ambulation/Gait assistance: Min assist;+2 safety/equipment Ambulation Distance (Feet): 80 Feet Assistive device: Rolling walker (2 wheeled) Gait Pattern/deviations: Step-to pattern     General Gait Details: short step length with slow gait speed. Pt able to ambulate twice to door and back, limited by wall suction. Safe technique performed   Stairs            Wheelchair Mobility    Modified Rankin (Stroke Patients Only)       Balance                                    Cognition Arousal/Alertness: Awake/alert Behavior During Therapy: WFL for tasks assessed/performed Overall Cognitive Status: Within Functional Limits for tasks assessed                      Exercises Other Exercises Other Exercises: Supine ther-ex performed including B LE SLR, hip add squeezes, hip abd/add with resistance, resisted heel slides, supine bridging, and ankle pumps. ALl ther-ex performed x 15 reps. No rest breaks required this session. CGA given for technique. Safe technique performed Other Exercises: Pt able to transfer to [facility] with min assist and supervision for hygiene. Safe technique performed  General Comments        Pertinent Vitals/Pain Pain Assessment: Faces Faces Pain Scale: Hurts a little bit Pain Location: surgical site, and tubes. Pain Descriptors / Indicators: Aching Pain Intervention(s): Limited activity within patient's tolerance    Home Living                      Prior Function            PT Goals (current goals can now be found in the care plan section) Acute Rehab PT Goals Patient Stated Goal: To return home, and  to be able to pay her bills. PT Goal Formulation: With patient Time For Goal Achievement: 08/23/16 Potential to Achieve Goals: Good Progress towards PT goals: Progressing toward goals    Frequency    Min 2X/week      PT Plan Current plan remains appropriate    Co-evaluation             End of Session   Activity Tolerance: Patient limited by fatigue Patient left: in chair;with nursing/sitter in room Nurse Communication: Mobility status PT Visit Diagnosis: Muscle weakness (generalized) (M62.81);Pain;Difficulty in walking, not elsewhere classified (R26.2)     Time: 5825-1898 PT Time Calculation (min) (ACUTE ONLY): 44 min  Charges:  $Gait Training: 8-22 mins $Therapeutic Exercise: 23-37 mins                    G Codes:       Khadejah Son Sep 09, 2016, 11:16 AM Greggory Stallion, PT, DPT [phone]

## 2016-08-20 NOTE — Progress Notes (Signed)
[place] INFECTIOUS DISEASE PROGRESS NOTE Date of Admission:  07/20/2016     ID: Lindsey Wolf is a 52 y.o. female with  intrabdominal infection post perforated peptic ulcer Active Problems:   Perforated ulcer ([place])   Abdominal abscess ([place])   Abnormal respirations   Pleural effusion   Respiratory distress   Empyema of left pleural space (HCC)   Protein-calorie malnutrition, severe   Subjective: No fevers,   ROS  Eleven systems are reviewed and negative except per hpi  Medications:  Antibiotics Given (last 72 hours)    Date/Time Action Medication Dose Rate   08/17/16 2219 Given   piperacillin-tazobactam (ZOSYN) IVPB 3.375 g 3.375 g 12.5 mL/hr   08/18/16 0618 Given   piperacillin-tazobactam (ZOSYN) IVPB 3.375 g 3.375 g 12.5 mL/hr   08/18/16 1429 Given   piperacillin-tazobactam (ZOSYN) IVPB 3.375 g 3.375 g 12.5 mL/hr   08/18/16 2230 Given   piperacillin-tazobactam (ZOSYN) IVPB 3.375 g 3.375 g 12.5 mL/hr   08/19/16 0528 Given   piperacillin-tazobactam (ZOSYN) IVPB 3.375 g 3.375 g 12.5 mL/hr   08/19/16 1400 Given   piperacillin-tazobactam (ZOSYN) IVPB 3.375 g 3.375 g 12.5 mL/hr   08/19/16 2100 Given   piperacillin-tazobactam (ZOSYN) IVPB 3.375 g 3.375 g 12.5 mL/hr   08/20/16 0559 Given   piperacillin-tazobactam (ZOSYN) IVPB 3.375 g 3.375 g 12.5 mL/hr   08/20/16 1449 Given   piperacillin-tazobactam (ZOSYN) IVPB 3.375 g 3.375 g 12.5 mL/hr     . anidulafungin  100 mg Intravenous Q24H  . enoxaparin (LOVENOX) injection  40 mg Subcutaneous Q24H  . feeding supplement (ENSURE ENLIVE)  237 mL Oral TID BM  . FLUoxetine  40 mg Oral q morning - 10a  . mouth rinse  15 mL Mouth Rinse BID  . nystatin  5 mL Oral QID  . pantoprazole (PROTONIX) IV  40 mg Intravenous Q12H  . piperacillin-tazobactam (ZOSYN)  IV  3.375 g Intravenous Q8H  . polyethylene glycol  17 g Oral Daily  . sodium chloride flush  10-40 mL Intracatheter Q12H    Objective: Vital signs in last 24  hours: Temp:  [97.5 F (36.4 C)-98.1 F (36.7 C)] 98 F (36.7 C) (03/19 1245) Pulse Rate:  [86-101] 87 (03/19 1245) Resp:  [16-20] 20 (03/19 1245) BP: (100-107)/(55-66) 104/62 (03/19 1245) SpO2:  [95 %-99 %] 99 % (03/19 1245) Weight:  [55 kg (121 lb 4.8 oz)] 55 kg (121 lb 4.8 oz) (03/19 0500) Constitutional:  Ill appearing HENT: [place]/AT, PERRLA, no scleral icterus Mouth/Throat: Oropharynx is clear and dry. No oropharyngeal exudate.  Cardiovascular: Normal rate, regular rhythm and normal heart sounds.  Pulmonary/Chest: bil rhonchi Bil chest tubes with ss drainage Neck = supple, no nuchal rigidity Abdominal: Soft. abd mildly distended, inciision along midline covered with wound vac 1 JP drains in place o  R - with purulent material, Lymphadenopathy: no cervical adenopathy. No axillary adenopathy Neurological: alert and oriented to person, place, and time.  Skin: Skin is warm and dry. No rash noted. No erythema.  PICC RUE wnl Foley cath in place Psychiatric: a normal mood and affect.  behavior is normal.   Lab Results  Recent Labs  08/19/16 0825 08/20/16 0423  WBC 20.8* 17.4*  HGB 9.7* 9.5*  HCT 31.4* 29.0*  NA 136 137  K 3.6 3.7  CL 103 104  CO2 28 28  BUN 5* 7  CREATININE 0.32* <0.30*    Microbiology: Results for orders placed or performed during the hospital encounter of 07/20/16  [place] rt  PCR ([place] only)     Status: None   Collection Time: 07/20/16  1:15 PM  Result Value Ref Range Status   Specimen source GC/Chlam ENDOCERVICAL  Final   Chlamydia Tr NOT DETECTED NOT DETECTED Final   N gonorrhoeae NOT DETECTED NOT DETECTED Final    Comment: (NOTE) 100  This methodology has not been evaluated in pregnant women or in 200  patients with a history of hysterectomy. 300 400  This methodology will not be performed on patients less than 9  years of age.   Wet prep, genital     Status: Abnormal   Collection Time: 07/20/16  1:15 PM  Result Value Ref Range  Status   Yeast Wet Prep HPF POC NONE SEEN NONE SEEN Final   Trich, Wet Prep NONE SEEN NONE SEEN Final   Clue Cells Wet Prep HPF POC NONE SEEN NONE SEEN Final   WBC, Wet Prep HPF POC RARE (A) NONE SEEN Final   Sperm NONE SEEN  Final  Blood culture (routine x 2)     Status: None   Collection Time: 07/20/16  3:07 PM  Result Value Ref Range Status   Specimen Description BLOOD left forearm  Final   Special Requests   Final    BOTTLES DRAWN AEROBIC AND ANAEROBIC AER12ML ANA12ML   Culture NO GROWTH 5 DAYS  Final   Report Status 07/25/2016 FINAL  Final  MRSA PCR Screening     Status: None   Collection Time: 07/20/16  9:29 PM  Result Value Ref Range Status   MRSA by PCR NEGATIVE NEGATIVE Final    Comment:        The GeneXpert MRSA Assay (FDA approved for NASAL specimens only), is one component of a comprehensive MRSA colonization surveillance program. It is not intended to diagnose MRSA infection nor to guide or monitor treatment for MRSA infections.   Aerobic/Anaerobic Culture (surgical/deep wound)     Status: None   Collection Time: 07/25/16  3:00 PM  Result Value Ref Range Status   Specimen Description ABSCESS RIGHT ABDOMEN  Final   Special Requests NONE  Final   Gram Stain   Final    RARE WBC PRESENT, PREDOMINANTLY PMN NO ORGANISMS SEEN    Culture   Final    No growth aerobically or anaerobically. Performed at [facility], [place] [address], [place], [place] [address]    Report Status 07/31/2016 FINAL  Final  Body fluid culture     Status: None   Collection Time: 07/30/16 11:40 AM  Result Value Ref Range Status   Specimen Description PLEURAL  Final   Special Requests NONE  Final   Gram Stain   Final    MODERATE WBC PRESENT, PREDOMINANTLY PMN NO ORGANISMS SEEN    Culture   Final    No growth aerobically or anaerobically. Performed at [facility], [place] [address], [place], [place] [address]    Report Status 08/03/2016 FINAL  Final  Acid Fast Smear (AFB)      Status: None   Collection Time: 07/30/16 11:40 AM  Result Value Ref Range Status   AFB Specimen Processing Concentration  Final   Acid Fast Smear Negative  Final    Comment: (NOTE) Performed At: [facility] [place], [place] [id number] Lindon Romp MD [id number]    Source (AFB) PLEURAL  Final  Aerobic Culture (superficial specimen)     Status: None   Collection Time: 08/06/16  3:34 PM  Result Value Ref Range Status   Specimen Description ABDOMEN  Final   Special Requests Normal  Final   Gram Stain   Final    RARE WBC PRESENT,BOTH PMN AND MONONUCLEAR NO ORGANISMS SEEN Performed at [facility], [address] [place], [place] [address]    Culture RARE ESCHERICHIA COLI  Final   Report Status 08/09/2016 FINAL  Final   Organism ID, Bacteria ESCHERICHIA COLI  Final      Susceptibility   Escherichia coli - MIC*    AMPICILLIN <=2 SENSITIVE Sensitive     CEFAZOLIN <=4 SENSITIVE Sensitive     CEFEPIME <=1 SENSITIVE Sensitive     CEFTAZIDIME <=1 SENSITIVE Sensitive     CEFTRIAXONE <=1 SENSITIVE Sensitive     CIPROFLOXACIN <=0.25 SENSITIVE Sensitive     GENTAMICIN <=1 SENSITIVE Sensitive     IMIPENEM <=0.25 SENSITIVE Sensitive     TRIMETH/SULFA <=20 SENSITIVE Sensitive     AMPICILLIN/SULBACTAM <=2 SENSITIVE Sensitive     PIP/TAZO <=4 SENSITIVE Sensitive     Extended ESBL NEGATIVE Sensitive     * RARE ESCHERICHIA COLI  Fungus Culture With Stain     Status: None   Collection Time: 08/08/16  3:31 PM  Result Value Ref Range Status   Fungus Stain Final report  Final   Fungus (Mycology) Culture Preliminary report  Final    Comment: (NOTE) Performed At: [facility] [address] [place], [place] [id number] Lindon Romp MD [id number]:[id number]    Fungal Source ABDOMEN  Final    Comment: ABDOMINAL WALL WOUND  GOES WITH ACC [id number]   Fungus Culture With Stain     Status: None (Preliminary result)   Collection Time: 08/08/16  3:31 PM   Result Value Ref Range Status   Fungus Stain Final report  Final    Comment: (NOTE) Performed At: [facility] [place], [place] [id number] Lindon Romp MD [id number]    Fungus (Mycology) Culture PENDING  Incomplete   Fungal Source ABDOMEN  Final    Comment: ABDOMINAL WALL 2  GOES WITH ACC [id number]   Fungus Culture With Stain     Status: None (Preliminary result)   Collection Time: 08/08/16  3:31 PM  Result Value Ref Range Status   Fungus Stain Final report  Final    Comment: (NOTE) Performed At: [facility] [place], [place] [id number] Lindon Romp MD [id number]    Fungus (Mycology) Culture PENDING  Incomplete   Fungal Source PLEURAL  Final    Comment: RIGHT PLEURAL SPACE  Fungus Culture With Stain     Status: None (Preliminary result)   Collection Time: 08/08/16  3:31 PM  Result Value Ref Range Status   Fungus Stain Final report  Final    Comment: (NOTE) Performed At: [facility] [place], [place] [id number] Lindon Romp MD [id number]:[id number]    Fungus (Mycology) Culture PENDING  Incomplete   Fungal Source PLEURAL  Final    Comment: LEFT PLEURAL SPACE  Aerobic/Anaerobic Culture (surgical/deep wound)     Status: None   Collection Time: 08/08/16  3:31 PM  Result Value Ref Range Status   Specimen Description WOUND  Final   Special Requests ABDOMINAL WALL WOUND  Final   Gram Stain   Final    ABUNDANT WBC PRESENT, PREDOMINANTLY PMN RARE GRAM POSITIVE COCCI IN PAIRS Performed at [facility], [address], [place], [place] [address]    Culture  Final    FEW ESCHERICHIA COLI RARE ACTINOMYCES ODONTOLYTICUS SUSCEPTIBILITIES PERFORMED ON PREVIOUS CULTURE WITHIN THE LAST 5 DAYS. MIXED ANAEROBIC FLORA PRESENT.  CALL LAB IF FURTHER IID REQUIRED.    Report Status 08/13/2016 FINAL  Final  Aerobic/Anaerobic Culture (surgical/deep wound)     Status: None   Collection Time: 08/08/16  3:31  PM  Result Value Ref Range Status   Specimen Description WOUND  Final   Special Requests ABDOMINAL WALL WOUND 2  Final   Gram Stain   Final    ABUNDANT WBC PRESENT, PREDOMINANTLY PMN RARE GRAM POSITIVE COCCI IN PAIRS FEW GRAM POSITIVE RODS    Culture   Final    RARE ESCHERICHIA COLI RARE ACTINOMYCES ODONTOLYTICUS FEW BACTEROIDES FRAGILIS RARE BACTEROIDES THETAIOTAOMICRON BETA LACTAMASE POSITIVE FOR BOTH BACTEROIDES SPECIES Performed at [facility], [place] [address], [place], [place] [address]    Report Status 08/13/2016 FINAL  Final   Organism ID, Bacteria ESCHERICHIA COLI  Final      Susceptibility   Escherichia coli - MIC*    AMPICILLIN 4 SENSITIVE Sensitive     CEFAZOLIN <=4 SENSITIVE Sensitive     CEFEPIME <=1 SENSITIVE Sensitive     CEFTAZIDIME <=1 SENSITIVE Sensitive     CEFTRIAXONE <=1 SENSITIVE Sensitive     CIPROFLOXACIN <=0.25 SENSITIVE Sensitive     GENTAMICIN <=1 SENSITIVE Sensitive     IMIPENEM <=0.25 SENSITIVE Sensitive     TRIMETH/SULFA <=20 SENSITIVE Sensitive     AMPICILLIN/SULBACTAM <=2 SENSITIVE Sensitive     PIP/TAZO <=4 SENSITIVE Sensitive     Extended ESBL NEGATIVE Sensitive     * RARE ESCHERICHIA COLI  Aerobic/Anaerobic Culture (surgical/deep wound)     Status: None   Collection Time: 08/08/16  3:31 PM  Result Value Ref Range Status   Specimen Description WOUND  Final   Special Requests RIGHT PLURAL SPACE  Final   Gram Stain   Final    FEW WBC PRESENT,BOTH PMN AND MONONUCLEAR NO ORGANISMS SEEN    Culture   Final    NO GROWTH 5 DAYS Performed at [facility], [address] [place], [place] [address]    Report Status 08/13/2016 FINAL  Final  Aerobic/Anaerobic Culture (surgical/deep wound)     Status: None   Collection Time: 08/08/16  3:31 PM  Result Value Ref Range Status   Specimen Description WOUND  Final   Special Requests LEFT PLERAL SPACE  Final   Gram Stain   Final    FEW WBC PRESENT,BOTH PMN AND MONONUCLEAR NO ORGANISMS  SEEN    Culture   Final    MODERATE BACTEROIDES FRAGILIS BETA LACTAMASE POSITIVE Performed at [facility], [place] [address], [place], [place] [address]    Report Status 08/13/2016 FINAL  Final  Fungus Culture With Stain     Status: None (Preliminary result)   Collection Time: 08/08/16  3:31 PM  Result Value Ref Range Status   Fungus Stain Final report  Final    Comment: (NOTE) Performed At: [facility] [place], [place] [id number] Lindon Romp MD [id number]    Fungus (Mycology) Culture PENDING  Incomplete   Fungal Source PLEURAL  Final    Comment: PLEURAL PEEL  Aerobic/Anaerobic Culture (surgical/deep wound)     Status: None   Collection Time: 08/08/16  3:31 PM  Result Value Ref Range Status   Specimen Description WOUND  Final   Special Requests PLEURAL PEEL LEFT  Final   Gram  Stain   Final    FEW WBC PRESENT,BOTH PMN AND MONONUCLEAR NO ORGANISMS SEEN Performed at [facility], [place] [address], [place], [place] [address]    Culture   Final    FEW BACTEROIDES FRAGILIS BETA LACTAMASE POSITIVE RARE CLOSTRIDIUM CADAVERIS    Report Status 08/13/2016 FINAL  Final  Fungus Culture Result     Status: None   Collection Time: 08/08/16  3:31 PM  Result Value Ref Range Status   Result 1 Comment  Final    Comment: (NOTE) KOH/Calcofluor preparation:  no fungus observed. Performed At: [facility] [place], [place] [id number] Lindon Romp MD [id number]   Fungus Culture Result     Status: None   Collection Time: 08/08/16  3:31 PM  Result Value Ref Range Status   Result 1 Comment  Final    Comment: (NOTE) KOH/Calcofluor preparation:  no fungus observed. Performed At: [facility] [place], [place] [id number] Lindon Romp MD [id number]   Fungus Culture Result     Status: None   Collection Time: 08/08/16  3:31 PM  Result Value Ref Range Status   Result 1 Comment  Final    Comment:  (NOTE) KOH/Calcofluor preparation:  no fungus observed. Performed At: [facility] [place], [place] [id number] Lindon Romp MD [id number]   Fungus Culture Result     Status: None   Collection Time: 08/08/16  3:31 PM  Result Value Ref Range Status   Result 1 Comment  Final    Comment: (NOTE) KOH/Calcofluor preparation:  no fungus observed. Performed At: [facility] [place], [place] [id number] Lindon Romp MD GY:[id number]   Fungus Culture Result     Status: None   Collection Time: 08/08/16  3:31 PM  Result Value Ref Range Status   Result 1 Comment  Final    Comment: (NOTE) KOH/Calcofluor preparation:  no fungus observed. Performed At: [facility] [place], [place] [id number] Lindon Romp MD [id number]   Fungal organism reflex     Status: None   Collection Time: 08/08/16  3:31 PM  Result Value Ref Range Status   Fungal result 1 Candida albicans  Final    Comment: (NOTE) Light growth Performed At: [facility] [place], [place] [id number] Lindon Romp MD [id number]   Aerobic/Anaerobic Culture (surgical/deep wound)     Status: None   Collection Time: 08/12/16  8:34 AM  Result Value Ref Range Status   Specimen Description WOUND PERITONEAL SITE FOR WOUND VAC DRESSING  Final   Special Requests NONE  Final   Gram Stain   Final    FEW WBC PRESENT, PREDOMINANTLY PMN RARE GRAM VARIABLE COCCI Performed at [facility], [place] [address] [place], [place] [address]    Culture   Final    RARE ESCHERICHIA COLI MIXED ANAEROBIC FLORA PRESENT.  CALL LAB IF FURTHER IID REQUIRED.    Report Status 08/18/2016 FINAL  Final   Organism ID, Bacteria ESCHERICHIA COLI  Final      Susceptibility   Escherichia coli - MIC*    AMPICILLIN >=32 RESISTANT Resistant     CEFAZOLIN >=64 RESISTANT Resistant     CEFEPIME <=1 SENSITIVE Sensitive     CEFTAZIDIME <=1 SENSITIVE  Sensitive     CEFTRIAXONE <=1 SENSITIVE Sensitive     CIPROFLOXACIN <=0.25 SENSITIVE Sensitive     GENTAMICIN <=1 SENSITIVE Sensitive  IMIPENEM <=0.25 SENSITIVE Sensitive     TRIMETH/SULFA <=20 SENSITIVE Sensitive     AMPICILLIN/SULBACTAM >=32 RESISTANT Resistant     PIP/TAZO <=4 SENSITIVE Sensitive     Extended ESBL NEGATIVE Sensitive     * RARE ESCHERICHIA COLI  Aerobic/Anaerobic Culture (surgical/deep wound)     Status: None (Preliminary result)   Collection Time: 08/17/16 12:30 PM  Result Value Ref Range Status   Specimen Description DRAINAGE  Final   Special Requests Normal  Final   Gram Stain   Final    FEW WBC PRESENT, PREDOMINANTLY PMN RARE GRAM POSITIVE COCCI IN PAIRS    Culture   Final    FEW BACTEROIDES FRAGILIS BETA LACTAMASE POSITIVE Performed at [facility], [place] [address], [place], [place] [address]    Report Status PENDING  Incomplete    Studies/Results: Dg Chest Port 1 View  Result Date: 08/20/2016 CLINICAL DATA:  Chest tube. EXAM: PORTABLE CHEST 1 VIEW COMPARISON:  08/18/2016 . FINDINGS: Right PICC line, left chest tubes in stable position. Heart size normal. Bibasilar atelectasis again noted. Small bilateral pleural effusions. Stable left apical pleural thickening. No pneumothorax. Staples noted over the left chest. IMPRESSION: 1. Left chest tubes in stable position.  No pneumothorax. 2. Bibasilar atelectasis again noted. Small bilateral pleural effusions. Electronically Signed   By: Marcello Moores  Register   On: 08/20/2016 07:02    Assessment/Plan: Lindsey Wolf is a 51 y.o. female with perforated peptic ulcer, with complicated course since admission with placement of an IR drain and then repeat surgery 2/26. She has been on zosyn since admission and started fluconazole 2/26 - changed to eraxis 3/14.  Multiple drains in place.  PICC in place and on TPN.  Had L empyema noted and s/p decortication with about 500 cc pus aspirated with cx growing bacteroides,  and clostridium and candida.  Wound cx with E coli, actinomyces. - E coli- sensitive to zosyn.  WBC down 39-> 16.7 after surgery but increased ad found to have loculated fluid on L chest  3/16 had repeat drain on L - cx with bacteroides Her R abd JP drain has some purulent drainage 3/19 - wbc decreasing. Remains weak  Recommendations Will need several more weeks of IV zosyn (ertapenem does not cover pseudomonas nor enterococcus two common pathogens in cases such as this) I have placed abx order sheet for this. If dced to facility or home I can see in 7-10 days after dc.  Changed fluconazole to eraxis given risk of resistance but once ready for dc can change back to fluconazole 200 qd for another 2 weeks Continue management of chest tubes per Dr Genevive Bi and abd drains by surgery Thank you very much for the consult. Will follow with you.  Brodie Scovell P   08/20/2016, 4:44 PM

## 2016-08-21 ENCOUNTER — Other Ambulatory Visit: Payer: Self-pay

## 2016-08-21 DIAGNOSIS — J869 Pyothorax without fistula: Secondary | ICD-10-CM

## 2016-08-21 NOTE — Progress Notes (Signed)
Physical Therapy Treatment Patient Details Name: Lindsey Wolf MRN: [id number] DOB: [birth year] Today's Date: 08/21/2016    History of Present Illness Pt is a 51 year old female status post complicated repair of peptic ulcer disease. She later underwent a repeat laparotomy for drainage of intra-abdominal abscesses. Pt diagnosed with Acute hypoxic respiratory failure with L chest tube placement, septic shock with hypotension, and A-fib with RVR.  Pt also has multiple JP drains and was intubated but is now extubated. Pt underwent L thoracotomy and thoracoscopy along with debridement of abdominal wall abscess on 08/08/16.  Pt now with B chest tubes, JP drains, and wound vac. Of note, small pneumothorax per chart review.  Patient underwent wound vac placement and debridement of abd wound 08/12/16. Pt now has several chest tubes draining to foley bag and additional chest tube to wall suction.    PT Comments    Pt is making good progress towards goals with ability to ambulate around RN station this date with RW. Still requires +2 for equipment. Has increased difficulty with sit<>stand transfers this date. Problem solved ideas and ways to push from seated surface to improve ease. Able to tolerate slight resistance to there-ex this date. Continues to be motivated to perform therapy. Chest tube now able to be to water seal.   Follow Up Recommendations  SNF     Equipment Recommendations  Rolling walker with 5" wheels    Recommendations for Other Services       Precautions / Restrictions Precautions Precautions: Fall Restrictions Weight Bearing Restrictions: No    Mobility  Bed Mobility Overal bed mobility: Needs Assistance Bed Mobility: Supine to Sit     Supine to sit: Min assist     General bed mobility comments: assist for trunk flexion and scooting out towards EOB. Once seated at EOB, pt able to sit with upright posture.  Transfers Overall transfer level: Needs  assistance Equipment used: Rolling walker (2 wheeled) Transfers: Sit to/from Stand Sit to Stand: Mod assist         General transfer comment: Increased assist needed this date with cues for pushing from seated surface. Once standing, pt able to stand with upright posture  Ambulation/Gait Ambulation/Gait assistance: Min guard;+2 physical assistance Ambulation Distance (Feet): 150 Feet Assistive device: Rolling walker (2 wheeled) Gait Pattern/deviations: Step-through pattern     General Gait Details: ambulated around RN station with slow gait speed. Shoes donned for increased traction. RN connected chest tube to water seal. RW used for mobility   Stairs            Wheelchair Mobility    Modified Rankin (Stroke Patients Only)       Balance                                    Cognition Arousal/Alertness: Awake/alert Behavior During Therapy: WFL for tasks assessed/performed Overall Cognitive Status: Within Functional Limits for tasks assessed                      Exercises Other Exercises Other Exercises: supine ther-ex performed including resisted heel slides, SLRs, ankle pumps, hip add squeezes, and supine bridging. All ther-ex performed x 12-15 reps with cga. Safe technique performed'    General Comments        Pertinent Vitals/Pain Pain Assessment: No/denies pain    Home Living  Prior Function            PT Goals (current goals can now be found in the care plan section) Acute Rehab PT Goals Patient Stated Goal: To return home, and to be able to pay her bills. PT Goal Formulation: With patient Time For Goal Achievement: 08/23/16 Potential to Achieve Goals: Good Progress towards PT goals: Progressing toward goals    Frequency    Min 2X/week      PT Plan Current plan remains appropriate    Co-evaluation             End of Session   Activity Tolerance: Patient limited by  fatigue Patient left: in bed Nurse Communication: Mobility status PT Visit Diagnosis: Muscle weakness (generalized) (M62.81);Pain;Difficulty in walking, not elsewhere classified (R26.2)     Time: 1000-1029 PT Time Calculation (min) (ACUTE ONLY): 29 min  Charges:  $Gait Training: 8-22 mins $Therapeutic Exercise: 8-22 mins                    G Codes:       Kahil Agner 08-24-16, 10:45 AM Greggory Stallion, PT, DPT [phone]

## 2016-08-21 NOTE — Progress Notes (Signed)
s/p perforated ulcer and laparotomy x 2 Empyema s/p thoracotomy and decortication s/p placement of new CT by IR  Main issue is deconditioning Now working w PT Taking some PO but not 100% yet, no emesis   PE NAD Chest CTA, no airleak from pigtail, empyema tubes connected to a foley Abd: wound vac in place, JP some seropurulent fluid, no enteric contents  A/P Doing well Will need prolonged A/Bs, zosyn and antifungal  CT to water seal and keep empyema tubes to foley bag Aggressive PT and nutritional support Pending placement

## 2016-08-21 NOTE — Progress Notes (Signed)
Calorie Count Note  48 hour calorie count ordered. Day 1 results below:  Diet: Regular Supplements: Ensure Enlive po TID, each supplement provides 350 kcal and 20 grams of protein  Dinner 3/19: 76 kcal, 0 grams of protein (no dinner ordered, 118 ml grape juice) Breakfast 3/20: 146 kcal, 12 grams of protein (50% omelet with ham and cheese, 100% Diet Coke, which has no calories) Lunch 3/20: N/A (patient did not have lunch) Supplements: 175 kcal, 10 grams of protein (1/2 bottle Ensure Enlive on 3/19)  Total intake: 397 kcal (23% of minimum estimated needs)  22 grams of protein (26% of minimum estimated needs)  Estimated Nutritional Needs:  Kcal:  1700-1900 (MSJ x 1.3-1.5) Protein:  85-100 grams (1.5-1.8 grams/kg) Fluid:  1.7-1.9 L/day (30-35 ml/kg)  Nutrition Dx: Inadequate oral intake related to poor appetite, acute illness as evidenced by meal completion <50%, per patient/family report  Goal: Patient will meet greater than or equal to 90% of their needs  Intervention: -Continue Ensure Enlive po TID, each supplement provides 350 kcal and 20 grams of protein. -Continue calorie count.  Willey Blade, MS, RD, LDN Pager: [phone] After Hours Pager: [phone]

## 2016-08-21 NOTE — Progress Notes (Signed)
Wound vac dressing changed. Staples removed from left thoracotomy site. Steri strips placed with MD at bedside.

## 2016-08-21 NOTE — Progress Notes (Signed)
Doing better.  Ambulating in halls.  Afebrile.  Staples removed from thoracotomy wound.  Clean and without erythema. Wound vac on abdomen changed.  Very clean.  No necrotic debris to debride  Irrigated pigtail with 10 cc of saline - no additional fluid obtained.  Placed pigtail to water seal  Will convert pigtail to "empyema tubes" tomorrow.  [employer].

## 2016-08-21 NOTE — Progress Notes (Signed)
Nutrition Follow-up  DOCUMENTATION CODES:   Severe malnutrition in context of acute illness/injury  INTERVENTION:  Recommend placement of small-bore NG tube for initiation of enteral nutrition at this time. Patient meeting <25% energy needs for 9 days now after nutrition interventions of oral nutrition supplements, encouragement, and education. Malnutrition puts patient at increased risk of post-operative complications, morbidity, mortality and patient is unable to meet calorie/protein intake with PO intake. Discussed nutrition plan of care with Dr. Dahlia Byes and there is concern for tolerance of NG tube feeding after multiple surgeries. Strongly recommend considering NG tube placement as enteral nutrition will maintain integrity of GI mucosal barrier and reduce risk of infectious complications in addition to addressing patient's malnutrition. Patient does not meet any contraindications for EN.   Provided patient with High-Calorie, High-Protein Nutrition Therapy handout from the [employer] and discussed thoroughly. Provided patient with her calorie, protein, and fluid goals for adequate healing.   Continue Ensure Enlive po TID, each supplement provides 350 kcal and 20 grams of protein. Patient is only drinking 1/2-1 of these daily at this point and reports she cannot do any better.  RD also offered to order snacks for patient between meals but she refuses at this time reporting she cannot eat any more than she is eating at this time.  NUTRITION DIAGNOSIS:   Inadequate oral intake related to poor appetite, acute illness as evidenced by per patient/family report, meal completion < 50%.  Ongoing.  GOAL:   Patient will meet greater than or equal to 90% of their needs  Not met.  MONITOR:   I & O's, Diet advancement, Labs, Weight trends, Supplement acceptance  REASON FOR ASSESSMENT:   Malnutrition Screening Tool    ASSESSMENT:   Lindsey Wolf is a 52 y.o.  female with a 3 days hx of abdominal pain in the epigastric area and she thinks it was a gas type. Pain is moderate to severe in intensity and has worsening today.  Spoke with patient at bedside. She reports her appetite remains poor and she is doing the best she can to eat and drink. She reports she is not able to taste food well. Reports some abdominal pain. Denies N/V.   Patient initiated on 48 hour calorie count due to concern that she is unable to meet her needs with PO intake. Patient only meeting 23% calorie and 26% protein needs. Patient now day 9 of inadequate intake and meets criteria for severe acute malnutrition.  Medications reviewed and include: pantoprazole, Zosyn, Miralax, Dilaudid PRN, oxycodone PRN.  Labs reviewed: Creatinine <0.3.   Discussed with RN.   Diet Order:  Diet regular Room service appropriate? Yes; Fluid consistency: Thin  Skin:  Wound (see comment) (stage II pressure ulcer in nare)  Last BM:  08/12/2016  Height:   Ht Readings from Last 1 Encounters:  08/08/16 '5\' 10"'  (1.778 m)    Weight:   Wt Readings from Last 1 Encounters:  08/21/16 118 lb 4.8 oz (53.7 kg)    Ideal Body Weight:  68.18 kg  BMI:  Body mass index is 16.97 kg/m.  Estimated Nutritional Needs:   Kcal:  1700-1900 (MSJ x 1.3-1.5)  Protein:  85-100 grams (1.5-1.8 grams/kg)  Fluid:  1.7-1.9 L/day (30-35 ml/kg)  EDUCATION NEEDS:   Education needs addressed (High-Calorie, High-Protein Nutrition Therapy)  Willey Blade, MS, RD, LDN Pager: [phone] After Hours Pager: [phone]

## 2016-08-22 ENCOUNTER — Encounter: Payer: Self-pay | Admitting: Radiology

## 2016-08-22 ENCOUNTER — Inpatient Hospital Stay: Payer: BLUE CROSS/BLUE SHIELD

## 2016-08-22 ENCOUNTER — Telehealth: Payer: Self-pay

## 2016-08-22 LAB — BASIC METABOLIC PANEL
Anion gap: 6 (ref 5–15)
BUN: 8 mg/dL (ref 6–20)
CO2: 30 mmol/L (ref 22–32)
CREATININE: 0.41 mg/dL — AB (ref 0.44–1.00)
Calcium: 8.1 mg/dL — ABNORMAL LOW (ref 8.9–10.3)
Chloride: 101 mmol/L (ref 101–111)
GFR calc Af Amer: >60 mL/min (ref >60)
Glucose, Bld: 128 mg/dL — ABNORMAL HIGH (ref 65–99)
POTASSIUM: 3.1 mmol/L — AB (ref 3.5–5.1)
SODIUM: 137 mmol/L (ref 135–145)

## 2016-08-22 LAB — AEROBIC/ANAEROBIC CULTURE W GRAM STAIN (SURGICAL/DEEP WOUND): Special Requests: NORMAL

## 2016-08-22 LAB — AEROBIC/ANAEROBIC CULTURE (SURGICAL/DEEP WOUND)

## 2016-08-22 LAB — CBC
HCT: 32.4 % — ABNORMAL LOW (ref 35.0–47.0)
Hemoglobin: 10.1 g/dL — ABNORMAL LOW (ref 12.0–16.0)
MCH: 25.7 pg — AB (ref 26.0–34.0)
MCHC: 31.2 g/dL — AB (ref 32.0–36.0)
MCV: 82.3 fL (ref 80.0–100.0)
PLATELETS: 740 10*3/uL — AB (ref 150–440)
RBC: 3.94 MIL/uL (ref 3.80–5.20)
RDW: 23.5 % — ABNORMAL HIGH (ref 11.5–14.5)
WBC: 33.2 10*3/uL — ABNORMAL HIGH (ref 3.6–11.0)

## 2016-08-22 MED ORDER — IOPAMIDOL (ISOVUE-300) INJECTION 61%
15.0000 mL | INTRAVENOUS | Status: AC
  Administered 2016-08-22: 15 mL via ORAL

## 2016-08-22 MED ORDER — IOPAMIDOL (ISOVUE-300) INJECTION 61%
100.0000 mL | Freq: Once | INTRAVENOUS | Status: AC | PRN
  Administered 2016-08-22: 100 mL via INTRAVENOUS

## 2016-08-22 NOTE — Progress Notes (Signed)
Physical Therapy Treatment Patient Details Name: Lindsey Wolf MRN: [id number] DOB: January 14, 1966 Today's Date: 08/22/2016    History of Present Illness Pt is a 51 year old female status post complicated repair of peptic ulcer disease. She later underwent a repeat laparotomy for drainage of intra-abdominal abscesses. Pt diagnosed with Acute hypoxic respiratory failure with L chest tube placement, septic shock with hypotension, and A-fib with RVR.  Pt also has multiple JP drains and was intubated but is now extubated. Pt underwent L thoracotomy and thoracoscopy along with debridement of abdominal wall abscess on 08/08/16.  Pt now with B chest tubes, JP drains, and wound vac. Of note, small pneumothorax per chart review.  Patient underwent wound vac placement and debridement of abd wound 08/12/16. Pt now has several chest tubes draining to foley bag and additional chest tube to wall suction.    PT Comments    Pt is making good progress towards goals with improved ease with mobility this date. Pt agreeable to ambulate out in hallway with slightly improved speed this session. Only requires 1 assist for mobility at this time. Pt demonstrates improved ease for bed mobility/transfers; still requires cues for upright posture. Will continue to progress.   Follow Up Recommendations  SNF     Equipment Recommendations       Recommendations for Other Services       Precautions / Restrictions Precautions Precautions: Fall Restrictions Weight Bearing Restrictions: No    Mobility  Bed Mobility Overal bed mobility: Needs Assistance Bed Mobility: Supine to Sit     Supine to sit: Min guard Sit to supine: Min guard   General bed mobility comments: improved technique this session with pt able to slide B LEs off bed with decreased physical assist. Once seated at EOB, pt able to sit with upright posture. Once back in bed, needs assist for scooting up towards [place].  Transfers Overall transfer level:  Needs assistance Equipment used: Rolling walker (2 wheeled) Transfers: Sit to/from Stand Sit to Stand: Min assist         General transfer comment: decreased assist required for standing with cues to push from seated surface. Once standing, able to demonstrate upright posture. Improved technique noted from previous session.  Ambulation/Gait Ambulation/Gait assistance: Min guard Ambulation Distance (Feet): 200 Feet Assistive device: Rolling walker (2 wheeled) Gait Pattern/deviations: Step-through pattern Gait velocity: able to ambulate 10' in 12 seconds   General Gait Details: ambulated around RN station with slightly improved speed, able to demonstrate reciprocal gait pattern and carry conversation during ambulation   Stairs            Wheelchair Mobility    Modified Rankin (Stroke Patients Only)       Balance                                    Cognition Arousal/Alertness: Awake/alert Behavior During Therapy: WFL for tasks assessed/performed Overall Cognitive Status: Within Functional Limits for tasks assessed                      Exercises      General Comments        Pertinent Vitals/Pain Pain Assessment: No/denies pain    Home Living                      Prior Function  PT Goals (current goals can now be found in the care plan section) Acute Rehab PT Goals Patient Stated Goal: To return home, and to be able to pay her bills. PT Goal Formulation: With patient Time For Goal Achievement: 08/23/16 Potential to Achieve Goals: Good Progress towards PT goals: Progressing toward goals    Frequency    Min 2X/week      PT Plan Current plan remains appropriate    Co-evaluation             End of Session   Activity Tolerance: Patient limited by fatigue Patient left: in bed (refuses to sit in recliner) Nurse Communication: Mobility status PT Visit Diagnosis: Muscle weakness (generalized)  (M62.81);Pain;Difficulty in walking, not elsewhere classified (R26.2)     Time: 9390-3009 PT Time Calculation (min) (ACUTE ONLY): 24 min  Charges:  $Gait Training: 23-37 mins                    G Codes:       Milana Salay 2016/08/25, 4:52 PM  Greggory Stallion, PT, DPT [phone]

## 2016-08-22 NOTE — Clinical Social Work Note (Signed)
Patient still has bed at [facility] and the prior [name] is good through 3/27 according to [employer]. [facility] is aware that patient has chest tubes (not to suction) and wound vac. Patient has been progressing well but is not eating much at this time.  Shela Leff MSW,LCSW [phone]

## 2016-08-22 NOTE — Progress Notes (Signed)
Calorie Count Note  48 hour calorie count ordered. Day 2 results below:  Diet: Regular Supplements: Ensure Enlive po TID, each supplement provides 350 kcal and 20 grams of protein  Dinner 3/20: 365 kcal and 12 grams of protein (hot dog with chili) Breakfast 3/21: 300 kcal and 5 grams of protein (one pancake with syrup) Lunch 3/21: N/A (patient did not eat lunch) Supplements: 350 kcal and 20 grams of protein (1 bottle Ensure Enlive)  Total intake: 1015 kcal (60% of minimum estimated needs)  37 grams of protein (44% of minimum estimated needs)  Estimated Nutritional Needs:  Kcal:  1700-1900 (MSJ x 1.3-1.5) Protein:  85-100 grams (1.5-1.8 grams/kg) Fluid:  1.7-1.9 L/day (30-35 ml/kg)  Nutrition Dx: Inadequate oral intake related to poor appetite, acute illness as evidenced by per patient/family report, meal completion < 50%.  Goal: Patient will meet greater than or equal to 90% of their needs  Intervention: -On 3/20 provided patient with High-Calorie, High-Protein Nutrition Therapy handout from the [employer] and discussed thoroughly. Provided patient with her calorie, protein, and fluid goals for adequate healing.  -Continue Ensure Enlive po TID, each supplement provides 350 kcal and 20 grams of protein. Patient is only drinking 1/2-1 of these daily at this point and reports she cannot do any better. -RD also offered to order snacks for patient between meals but she refuses at this time reporting she cannot eat any more than she is eating at this time. -Will discontinue calorie count as 48 hours now completed.  Willey Blade, MS, RD, LDN Pager: [phone] After Hours Pager: [phone]

## 2016-08-22 NOTE — Progress Notes (Signed)
/  p perforated ulcer and laparotomy x 2 Empyema s/p thoracotomy and decortication s/p placement of new CT by IR  WBC bumped to 30 k No obvious signs of infection Afebrile Taking Po  PE NAD Chest: CT in place Abd: soft, NT< vac in place, jp some purulent fluid. No peritontis  A/P Leukocytosis BC and CT C/A/P Continue A/Bs and nutritional support Another source could be her PICC line

## 2016-08-22 NOTE — Progress Notes (Signed)
[place] INFECTIOUS DISEASE PROGRESS NOTE Date of Admission:  07/20/2016     ID: Eliseo Gum Sterkel is a 51 y.o. female with  intrabdominal infection post perforated peptic ulcer Active Problems:   Perforated ulcer ([place])   Abdominal abscess ([place])   Abnormal respirations   Pleural effusion   Respiratory distress   Empyema of left pleural space (HCC)   Protein-calorie malnutrition, severe   Subjective: WBC elevated to 30 but no fevers. Had CT scan done. [place] done as well.   ROS  Eleven systems are reviewed and negative except per hpi  Medications:  Antibiotics Given (last 72 hours)    Date/Time Action Medication Dose Rate   08/19/16 2100 Given   piperacillin-tazobactam (ZOSYN) IVPB 3.375 g 3.375 g 12.5 mL/hr   08/20/16 0559 Given   piperacillin-tazobactam (ZOSYN) IVPB 3.375 g 3.375 g 12.5 mL/hr   08/20/16 1449 Given   piperacillin-tazobactam (ZOSYN) IVPB 3.375 g 3.375 g 12.5 mL/hr   08/20/16 2211 Given   piperacillin-tazobactam (ZOSYN) IVPB 3.375 g 3.375 g 12.5 mL/hr   08/21/16 0601 Given   piperacillin-tazobactam (ZOSYN) IVPB 3.375 g 3.375 g 12.5 mL/hr   08/21/16 1456 Given   piperacillin-tazobactam (ZOSYN) IVPB 3.375 g 3.375 g 12.5 mL/hr   08/21/16 2031 Given   piperacillin-tazobactam (ZOSYN) IVPB 3.375 g 3.375 g 12.5 mL/hr   08/22/16 0516 Given   piperacillin-tazobactam (ZOSYN) IVPB 3.375 g 3.375 g 12.5 mL/hr   08/22/16 1431 Given   piperacillin-tazobactam (ZOSYN) IVPB 3.375 g 3.375 g 12.5 mL/hr     . anidulafungin  100 mg Intravenous Q24H  . enoxaparin (LOVENOX) injection  40 mg Subcutaneous Q24H  . feeding supplement (ENSURE ENLIVE)  237 mL Oral TID BM  . FLUoxetine  40 mg Oral q morning - 10a  . mouth rinse  15 mL Mouth Rinse BID  . nystatin  5 mL Oral QID  . pantoprazole (PROTONIX) IV  40 mg Intravenous Q12H  . piperacillin-tazobactam (ZOSYN)  IV  3.375 g Intravenous Q8H  . polyethylene glycol  17 g Oral Daily  . sodium chloride flush  10-40 mL  Intracatheter Q12H    Objective: Vital signs in last 24 hours: Temp:  [98.3 F (36.8 C)-99.1 F (37.3 C)] 98.3 F (36.8 C) (03/21 1339) Pulse Rate:  [83-88] 88 (03/21 1339) Resp:  [16-22] 22 (03/21 1339) BP: (106-116)/(61-70) 116/63 (03/21 1339) SpO2:  [96 %-98 %] 96 % (03/21 1339) Constitutional:  Ill appearing HENT: [place]/AT, PERRLA, no scleral icterus Mouth/Throat: Oropharynx is clear and dry. No oropharyngeal exudate.  Cardiovascular: Normal rate, regular rhythm and normal heart sounds.  Pulmonary/Chest: bil rhonchi Bil chest tubes with ss drainage Neck = supple, no nuchal rigidity Abdominal: Soft. abd mildly distended, inciision along midline covered with wound vac 1 JP drains in place on  R - with purulent material, Lymphadenopathy: no cervical adenopathy. No axillary adenopathy Neurological: alert and oriented to person, place, and time.  Skin: Skin is warm and dry. No rash noted. No erythema.  PICC RUE wnl wnl Psychiatric: a normal mood and affect.  behavior is normal.   Lab Results  Recent Labs  08/20/16 0423 08/22/16 0550 08/22/16 1039  WBC 17.4* 33.2*  --   HGB 9.5* 10.1*  --   HCT 29.0* 32.4*  --   NA 137  --  137  K 3.7  --  3.1*  CL 104  --  101  CO2 28  --  30  BUN 7  --  8  CREATININE <0.30*  --  0.41*    Microbiology: Results for orders placed or performed during the hospital encounter of 07/20/16  [place] rt PCR ([place] only)     Status: None   Collection Time: 07/20/16  1:15 PM  Result Value Ref Range Status   Specimen source GC/Chlam ENDOCERVICAL  Final   Chlamydia Tr NOT DETECTED NOT DETECTED Final   N gonorrhoeae NOT DETECTED NOT DETECTED Final    Comment: (NOTE) 100  This methodology has not been evaluated in pregnant women or in 200  patients with a history of hysterectomy. 300 400  This methodology will not be performed on patients less than 78  years of age.   Wet prep, genital     Status: Abnormal   Collection Time: 07/20/16   1:15 PM  Result Value Ref Range Status   Yeast Wet Prep HPF POC NONE SEEN NONE SEEN Final   Trich, Wet Prep NONE SEEN NONE SEEN Final   Clue Cells Wet Prep HPF POC NONE SEEN NONE SEEN Final   WBC, Wet Prep HPF POC RARE (A) NONE SEEN Final   Sperm NONE SEEN  Final  Blood culture (routine x 2)     Status: None   Collection Time: 07/20/16  3:07 PM  Result Value Ref Range Status   Specimen Description BLOOD left forearm  Final   Special Requests   Final    BOTTLES DRAWN AEROBIC AND ANAEROBIC AER12ML ANA12ML   Culture NO GROWTH 5 DAYS  Final   Report Status 07/25/2016 FINAL  Final  MRSA PCR Screening     Status: None   Collection Time: 07/20/16  9:29 PM  Result Value Ref Range Status   MRSA by PCR NEGATIVE NEGATIVE Final    Comment:        The GeneXpert MRSA Assay (FDA approved for NASAL specimens only), is one component of a comprehensive MRSA colonization surveillance program. It is not intended to diagnose MRSA infection nor to guide or monitor treatment for MRSA infections.   Aerobic/Anaerobic Culture (surgical/deep wound)     Status: None   Collection Time: 07/25/16  3:00 PM  Result Value Ref Range Status   Specimen Description ABSCESS RIGHT ABDOMEN  Final   Special Requests NONE  Final   Gram Stain   Final    RARE WBC PRESENT, PREDOMINANTLY PMN NO ORGANISMS SEEN    Culture   Final    No growth aerobically or anaerobically. Performed at [facility], [place] [address] [place], [place] [address]    Report Status 07/31/2016 FINAL  Final  Body fluid culture     Status: None   Collection Time: 07/30/16 11:40 AM  Result Value Ref Range Status   Specimen Description PLEURAL  Final   Special Requests NONE  Final   Gram Stain   Final    MODERATE WBC PRESENT, PREDOMINANTLY PMN NO ORGANISMS SEEN    Culture   Final    No growth aerobically or anaerobically. Performed at [facility], [place] [address] [place],  [address]    Report Status 08/03/2016 FINAL   Final  Acid Fast Smear (AFB)     Status: None   Collection Time: 07/30/16 11:40 AM  Result Value Ref Range Status   AFB Specimen Processing Concentration  Final   Acid Fast Smear Negative  Final    Comment: (NOTE) Performed At: [facility] [place], [place] [id number] Lindon Romp MD [id number]    Source (AFB) PLEURAL  Final  Aerobic Culture (superficial specimen)     Status: None   Collection Time: 08/06/16  3:34 PM  Result Value Ref Range Status   Specimen Description ABDOMEN  Final   Special Requests Normal  Final   Gram Stain   Final    RARE WBC PRESENT,BOTH PMN AND MONONUCLEAR NO ORGANISMS SEEN Performed at [facility], [address], [place], [place] [address]    Culture RARE ESCHERICHIA COLI  Final   Report Status 08/09/2016 FINAL  Final   Organism ID, Bacteria ESCHERICHIA COLI  Final      Susceptibility   Escherichia coli - MIC*    AMPICILLIN <=2 SENSITIVE Sensitive     CEFAZOLIN <=4 SENSITIVE Sensitive     CEFEPIME <=1 SENSITIVE Sensitive     CEFTAZIDIME <=1 SENSITIVE Sensitive     CEFTRIAXONE <=1 SENSITIVE Sensitive     CIPROFLOXACIN <=0.25 SENSITIVE Sensitive     GENTAMICIN <=1 SENSITIVE Sensitive     IMIPENEM <=0.25 SENSITIVE Sensitive     TRIMETH/SULFA <=20 SENSITIVE Sensitive     AMPICILLIN/SULBACTAM <=2 SENSITIVE Sensitive     PIP/TAZO <=4 SENSITIVE Sensitive     Extended ESBL NEGATIVE Sensitive     * RARE ESCHERICHIA COLI  Fungus Culture With Stain     Status: None   Collection Time: 08/08/16  3:31 PM  Result Value Ref Range Status   Fungus Stain Final report  Final   Fungus (Mycology) Culture Preliminary report  Final    Comment: (NOTE) Performed At: [facility] [address] [place], [place] [id number] Lindon Romp MD SE:[id number]    Fungal Source ABDOMEN  Final    Comment: ABDOMINAL WALL WOUND  GOES WITH ACC [id number]   Fungus Culture With Stain     Status: None (Preliminary result)    Collection Time: 08/08/16  3:31 PM  Result Value Ref Range Status   Fungus Stain Final report  Final    Comment: (NOTE) Performed At: [facility] [place], [place] [id number] Lindon Romp MD [id number]    Fungus (Mycology) Culture PENDING  Incomplete   Fungal Source ABDOMEN  Final    Comment: ABDOMINAL WALL 2  GOES WITH ACC [id number]   Fungus Culture With Stain     Status: None (Preliminary result)   Collection Time: 08/08/16  3:31 PM  Result Value Ref Range Status   Fungus Stain Final report  Final    Comment: (NOTE) Performed At: [facility] [place], [place] [id number] Lindon Romp MD FB:[id number]    Fungus (Mycology) Culture PENDING  Incomplete   Fungal Source PLEURAL  Final    Comment: RIGHT PLEURAL SPACE  Fungus Culture With Stain     Status: None (Preliminary result)   Collection Time: 08/08/16  3:31 PM  Result Value Ref Range Status   Fungus Stain Final report  Final    Comment: (NOTE) Performed At: [facility] [place], [place] [id number] Lindon Romp MD [id number]    Fungus (Mycology) Culture PENDING  Incomplete   Fungal Source PLEURAL  Final    Comment: LEFT PLEURAL SPACE  Aerobic/Anaerobic Culture (surgical/deep wound)     Status: None   Collection Time: 08/08/16  3:31 PM  Result Value Ref Range Status   Specimen Description WOUND  Final   Special Requests ABDOMINAL WALL WOUND  Final   Gram Stain   Final    ABUNDANT WBC PRESENT, PREDOMINANTLY PMN RARE GRAM POSITIVE  COCCI IN PAIRS Performed at [facility], [place] [address] [place], [place] [address]    Culture   Final    FEW ESCHERICHIA COLI RARE ACTINOMYCES ODONTOLYTICUS SUSCEPTIBILITIES PERFORMED ON PREVIOUS CULTURE WITHIN THE LAST 5 DAYS. MIXED ANAEROBIC FLORA PRESENT.  CALL LAB IF FURTHER IID REQUIRED.    Report Status 08/13/2016 FINAL  Final  Aerobic/Anaerobic Culture (surgical/deep wound)     Status: None    Collection Time: 08/08/16  3:31 PM  Result Value Ref Range Status   Specimen Description WOUND  Final   Special Requests ABDOMINAL WALL WOUND 2  Final   Gram Stain   Final    ABUNDANT WBC PRESENT, PREDOMINANTLY PMN RARE GRAM POSITIVE COCCI IN PAIRS FEW GRAM POSITIVE RODS    Culture   Final    RARE ESCHERICHIA COLI RARE ACTINOMYCES ODONTOLYTICUS FEW BACTEROIDES FRAGILIS RARE BACTEROIDES THETAIOTAOMICRON BETA LACTAMASE POSITIVE FOR BOTH BACTEROIDES SPECIES Performed at [facility], [place] [address], [place], [place] [address]    Report Status 08/13/2016 FINAL  Final   Organism ID, Bacteria ESCHERICHIA COLI  Final      Susceptibility   Escherichia coli - MIC*    AMPICILLIN 4 SENSITIVE Sensitive     CEFAZOLIN <=4 SENSITIVE Sensitive     CEFEPIME <=1 SENSITIVE Sensitive     CEFTAZIDIME <=1 SENSITIVE Sensitive     CEFTRIAXONE <=1 SENSITIVE Sensitive     CIPROFLOXACIN <=0.25 SENSITIVE Sensitive     GENTAMICIN <=1 SENSITIVE Sensitive     IMIPENEM <=0.25 SENSITIVE Sensitive     TRIMETH/SULFA <=20 SENSITIVE Sensitive     AMPICILLIN/SULBACTAM <=2 SENSITIVE Sensitive     PIP/TAZO <=4 SENSITIVE Sensitive     Extended ESBL NEGATIVE Sensitive     * RARE ESCHERICHIA COLI  Aerobic/Anaerobic Culture (surgical/deep wound)     Status: None   Collection Time: 08/08/16  3:31 PM  Result Value Ref Range Status   Specimen Description WOUND  Final   Special Requests RIGHT PLURAL SPACE  Final   Gram Stain   Final    FEW WBC PRESENT,BOTH PMN AND MONONUCLEAR NO ORGANISMS SEEN    Culture   Final    NO GROWTH 5 DAYS Performed at [facility], [address], [place], [place] [address]    Report Status 08/13/2016 FINAL  Final  Aerobic/Anaerobic Culture (surgical/deep wound)     Status: None   Collection Time: 08/08/16  3:31 PM  Result Value Ref Range Status   Specimen Description WOUND  Final   Special Requests LEFT PLERAL SPACE  Final   Gram Stain   Final    FEW WBC PRESENT,BOTH  PMN AND MONONUCLEAR NO ORGANISMS SEEN    Culture   Final    MODERATE BACTEROIDES FRAGILIS BETA LACTAMASE POSITIVE Performed at [facility], [place] [address], [place], [place] [address]    Report Status 08/13/2016 FINAL  Final  Fungus Culture With Stain     Status: None (Preliminary result)   Collection Time: 08/08/16  3:31 PM  Result Value Ref Range Status   Fungus Stain Final report  Final    Comment: (NOTE) Performed At: [facility] [place], [place] [id number] Lindon Romp MD MV:[id number]    Fungus (Mycology) Culture PENDING  Incomplete   Fungal Source PLEURAL  Final    Comment: PLEURAL PEEL  Aerobic/Anaerobic Culture (surgical/deep wound)     Status: None   Collection Time: 08/08/16  3:31 PM  Result Value  Ref Range Status   Specimen Description WOUND  Final   Special Requests PLEURAL PEEL LEFT  Final   Gram Stain   Final    FEW WBC PRESENT,BOTH PMN AND MONONUCLEAR NO ORGANISMS SEEN Performed at [facility], [address], [place], [place] [address]    Culture   Final    FEW BACTEROIDES FRAGILIS BETA LACTAMASE POSITIVE RARE CLOSTRIDIUM CADAVERIS    Report Status 08/13/2016 FINAL  Final  Fungus Culture Result     Status: None   Collection Time: 08/08/16  3:31 PM  Result Value Ref Range Status   Result 1 Comment  Final    Comment: (NOTE) KOH/Calcofluor preparation:  no fungus observed. Performed At: [facility] [place], [place] [id number] Lindon Romp MD GT:[id number]   Fungus Culture Result     Status: None   Collection Time: 08/08/16  3:31 PM  Result Value Ref Range Status   Result 1 Comment  Final    Comment: (NOTE) KOH/Calcofluor preparation:  no fungus observed. Performed At: [facility] [place], [place] [id number] Lindon Romp MD [id number]   Fungus Culture Result     Status: None   Collection Time: 08/08/16  3:31 PM  Result Value Ref Range Status    Result 1 Comment  Final    Comment: (NOTE) KOH/Calcofluor preparation:  no fungus observed. Performed At: [facility] [place], [place] [id number] Lindon Romp MD [id number]   Fungus Culture Result     Status: None   Collection Time: 08/08/16  3:31 PM  Result Value Ref Range Status   Result 1 Comment  Final    Comment: (NOTE) KOH/Calcofluor preparation:  no fungus observed. Performed At: [facility] [place], [place] [id number] Lindon Romp MD ZS:[id number]   Fungus Culture Result     Status: None   Collection Time: 08/08/16  3:31 PM  Result Value Ref Range Status   Result 1 Comment  Final    Comment: (NOTE) KOH/Calcofluor preparation:  no fungus observed. Performed At: [facility] [place], [place] [id number] Lindon Romp MD [id number]   Fungal organism reflex     Status: None   Collection Time: 08/08/16  3:31 PM  Result Value Ref Range Status   Fungal result 1 Candida albicans  Final    Comment: (NOTE) Light growth Performed At: [facility] [place], [place] [id number] Lindon Romp MD [id number]   Aerobic/Anaerobic Culture (surgical/deep wound)     Status: None   Collection Time: 08/12/16  8:34 AM  Result Value Ref Range Status   Specimen Description WOUND PERITONEAL SITE FOR WOUND VAC DRESSING  Final   Special Requests NONE  Final   Gram Stain   Final    FEW WBC PRESENT, PREDOMINANTLY PMN RARE GRAM VARIABLE COCCI Performed at [facility], [place] [address], [place], [place] [address]    Culture   Final    RARE ESCHERICHIA COLI MIXED ANAEROBIC [place]ORA PRESENT.  CALL LAB IF FURTHER IID REQUIRED.    Report Status 08/18/2016 FINAL  Final   Organism ID, Bacteria ESCHERICHIA COLI  Final      Susceptibility   Escherichia coli - MIC*    AMPICILLIN >=32 RESISTANT Resistant     CEFAZOLIN >=64 RESISTANT Resistant     CEFEPIME <=1 SENSITIVE Sensitive      CEFTAZIDIME <=1 SENSITIVE Sensitive     CEFTRIAXONE <=  1 SENSITIVE Sensitive     CIPROFLOXACIN <=0.25 SENSITIVE Sensitive     GENTAMICIN <=1 SENSITIVE Sensitive     IMIPENEM <=0.25 SENSITIVE Sensitive     TRIMETH/SULFA <=20 SENSITIVE Sensitive     AMPICILLIN/SULBACTAM >=32 RESISTANT Resistant     PIP/TAZO <=4 SENSITIVE Sensitive     Extended ESBL NEGATIVE Sensitive     * RARE ESCHERICHIA COLI  Aerobic/Anaerobic Culture (surgical/deep wound)     Status: None   Collection Time: 08/17/16 12:30 PM  Result Value Ref Range Status   Specimen Description DRAINAGE  Final   Special Requests Normal  Final   Gram Stain   Final    FEW WBC PRESENT, PREDOMINANTLY PMN RARE GRAM POSITIVE COCCI IN PAIRS    Culture   Final    FEW BACTEROIDES FRAGILIS BETA LACTAMASE POSITIVE Performed at [facility], [place] [address] [place], [place] [address]    Report Status 08/22/2016 FINAL  Final    Studies/Results: Dg Chest 1 View  Result Date: 08/22/2016 CLINICAL DATA:  Recent empyema with chest tube placement EXAM: CHEST 1 VIEW COMPARISON:  August 20, 2016 FINDINGS: Chest tubes are again noted on the left without pneumothorax. There is persistent consolidation in the left base with small left pleural effusion. There is apical pleural thickening on the left, stable. There is mild atelectasis in the right base. Central catheter tip is at the cavoatrial junction. Heart is upper normal in size with pulmonary vascularity within normal limits. No adenopathy. No evident bone lesions. IMPRESSION: Tube and catheter positions as described without evident pneumothorax. Consolidation with small pleural effusion left base persists. Mild right base atelectasis. No new opacity. Stable cardiac silhouette. Electronically Signed   By: Lowella Grip III M.D.   On: 08/22/2016 07:07   Ct Chest W Contrast  Result Date: 08/22/2016 CLINICAL DATA:  History of perforated ulcer and prior laparotomy with subsequent empyema and  thoracotomy with drainage, continued elevated white blood cell count EXAM: CT CHEST, ABDOMEN, AND PELVIS WITH CONTRAST TECHNIQUE: Multidetector CT imaging of the chest, abdomen and pelvis was performed following the standard protocol during bolus administration of intravenous contrast. CONTRAST:  150mL ISOVUE-300 IOPAMIDOL (ISOVUE-300) INJECTION 61% COMPARISON:  08/16/2016, 08/22/2016 FINDINGS: CT CHEST FINDINGS Cardiovascular: The thoracic aorta demonstrates atherosclerotic calcifications without aneurysmal dilatation. No dissection is seen. The pulmonary artery as visualized is within normal limits. No cardiac enlargement is seen. No significant coronary calcifications are noted. Mediastinum/Nodes: The thoracic inlet is within normal limits. No significant hilar or mediastinal adenopathy is identified. The esophagus as visualized is within normal limits. Lungs/Pleura: The right lung is well aerated. Only minimal fluid is noted as well as some mild basilar atelectasis. This overall appearance is improved when compared with the prior exam. On the left, multiple thoracostomy catheters are again identified and stable. No pneumothorax is seen. A new pigtail catheter is noted within the inferior empyema which has reduced in size significantly from the prior exam. Continued drainage is advised. Musculoskeletal: No acute bony abnormality is noted. CT ABDOMEN PELVIS FINDINGS Hepatobiliary: The liver and gallbladder are unchanged in appearance. 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 Pancreas: Unremarkable. No pancreatic ductal dilatation or surrounding inflammatory changes. Spleen: Stable medial subcapsular fluid collection is identified. It abuts the posterior wall of the stomach. The spleen is otherwise within normal limits. Adrenals/Urinary Tract: Adrenal glands are unremarkable. Kidneys are normal, without renal calculi, focal lesion, or hydronephrosis. Bladder is unremarkable. 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 No obstructive changes are noted. Vascular/Lymphatic: Aortic atherosclerosis. No enlarged abdominal or pelvic lymph nodes. Reproductive: Uterus is well visualized. A few small foci of air are again identified similar to that noted on previous exams but of uncertain significance. Other: Surgical drain in the lesser sac is again identified. No significant fluid collection is identified. A previously seen drain in the subcutaneous tissues has been removed. Some air is noted in this region. Better visualized on the current exam due to improved opacification of the small bowel is some inflammatory change and wall thickening in the distal small bowel as well as what appear to be two air-fluid collections in the mid right abdomen. One of these is noted on image number 92 of series 2 with a small amount of fluid within. This previously have the appearance of bowel and may still represent a large diverticulum. The second area is more inferior best seen on image number 108 of series 2. Possibility of postoperative abscess is deserves consideration. No significant free pelvic fluid is noted. Musculoskeletal: No acute bony abnormality is noted. IMPRESSION: CT of the chest: Interval reduction in empyema on the left when compared with the prior exam following drainage. CT of the abdomen and pelvis: Stable appearing splenic subcapsular fluid collection. Better visualized on the current examination due to improved opacification of the distal small bowel are too air fluid collections as described above. Possibility of postoperative abscess deserves consideration. These results will be called to the ordering clinician or representative by the Radiologist Assistant, and communication documented in the PACS or zVision Dashboard. Electronically Signed   By: Inez Catalina M.D.   On: 08/22/2016 13:53   Ct Abdomen Pelvis W Contrast  Result Date: 08/22/2016 CLINICAL DATA:  History of  perforated ulcer and prior laparotomy with subsequent empyema and thoracotomy with drainage, continued elevated white blood cell count EXAM: CT CHEST, ABDOMEN, AND PELVIS WITH CONTRAST TECHNIQUE: Multidetector CT imaging of the chest, abdomen and pelvis was performed following the standard protocol during bolus administration of intravenous contrast. CONTRAST:  136mL ISOVUE-300 IOPAMIDOL (ISOVUE-300) INJECTION 61% COMPARISON:  08/16/2016, 08/22/2016 FINDINGS: CT CHEST FINDINGS Cardiovascular: The thoracic aorta demonstrates atherosclerotic calcifications without aneurysmal dilatation. No dissection is seen. The pulmonary artery as visualized is within normal limits. No cardiac enlargement is seen. No significant coronary calcifications are noted. Mediastinum/Nodes: The thoracic inlet is within normal limits. No significant hilar or mediastinal adenopathy is identified. The esophagus as visualized is within normal limits. Lungs/Pleura: The right lung is well aerated. Only minimal fluid is noted as well as some mild basilar atelectasis. This overall appearance is improved when compared with the prior exam. On the left, multiple thoracostomy catheters are again identified and stable. No pneumothorax is seen. A new pigtail catheter is noted within the inferior empyema which has reduced in size significantly from the prior exam. Continued drainage is advised. Musculoskeletal: No acute bony abnormality is noted. CT ABDOMEN PELVIS FINDINGS Hepatobiliary: The liver and gallbladder are unchanged in appearance. 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 Pancreas: Unremarkable. No pancreatic ductal dilatation or surrounding inflammatory changes. Spleen: Stable medial subcapsular fluid collection is identified. It abuts the posterior wall of the stomach. The spleen is otherwise within normal limits. Adrenals/Urinary Tract: Adrenal glands are unremarkable. Kidneys are normal, without renal calculi, focal lesion, or hydronephrosis. Bladder  is unremarkable. 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 No obstructive changes are noted. Vascular/Lymphatic: Aortic atherosclerosis. No enlarged abdominal or pelvic lymph nodes. Reproductive: Uterus is  well visualized. A few small foci of air are again identified similar to that noted on previous exams but of uncertain significance. Other: Surgical drain in the lesser sac is again identified. No significant fluid collection is identified. A previously seen drain in the subcutaneous tissues has been removed. Some air is noted in this region. Better visualized on the current exam due to improved opacification of the small bowel is some inflammatory change and wall thickening in the distal small bowel as well as what appear to be two air-fluid collections in the mid right abdomen. One of these is noted on image number 92 of series 2 with a small amount of fluid within. This previously have the appearance of bowel and may still represent a large diverticulum. The second area is more inferior best seen on image number 108 of series 2. Possibility of postoperative abscess is deserves consideration. No significant free pelvic fluid is noted. Musculoskeletal: No acute bony abnormality is noted. IMPRESSION: CT of the chest: Interval reduction in empyema on the left when compared with the prior exam following drainage. CT of the abdomen and pelvis: Stable appearing splenic subcapsular fluid collection. Better visualized on the current examination due to improved opacification of the distal small bowel are too air fluid collections as described above. Possibility of postoperative abscess deserves consideration. These results will be called to the ordering clinician or representative by the Radiologist Assistant, and communication documented in the PACS or zVision Dashboard. Electronically Signed   By: Inez Catalina M.D.   On: 08/22/2016 13:53     Assessment/Plan: Lindsey Wolf is a 51 y.o. female with perforated peptic ulcer, with complicated course since admission with placement of an IR drain and then repeat surgery 2/26. She has been on zosyn since admission and started fluconazole 2/26 - changed to eraxis 3/14.  Multiple drains in place.  Had L empyema noted and s/Wolf decortication with about 500 cc pus aspirated with cx growing bacteroides, and clostridium and candida. Wound cx with E coli, actinomyces. - E coli- sensitive to zosyn.  WBC down 39-> 16.7 after surgery but increased and found to have loculated fluid on L chest  3/16 had repeat drain on L - cx with bacteroides Her R abd JP drain has some purulent drainage 3/19 - wbc decreasing. Remains weak 3/21 wbc up to 30. CT reviewed. [place] done.   Recommendations CT shows possibly 2 small abscesses- other than that her incisions and drains seem stable  WIll need to monitor wbc, defer to surgery re the fluid collections noted on CT. If decompensates would add vanco Will need several more weeks of IV zosyn (ertapenem does not cover pseudomonas nor enterococcus two common pathogens in cases such as this)  abx order sheet in chart.  If dced to facility or home I can see in 7-10 days after dc.  Cont  eraxis given risk of resistance but once ready for dc can change back to fluconazole 200 qd for another 2 weeks Continue management of chest tubes per Dr Genevive Bi and abd drains by surgery Thank you very much for the consult. Will follow with you.  Lindsey Wolf   08/22/2016, 2:31 PM

## 2016-08-22 NOTE — Telephone Encounter (Signed)
Received call from [facility] radiology regarding CT Chest Abdomen Pelvis.   Called to [facility] spoke with Raquel Sarna due to patient currently admitted and gave Raquel Sarna the message regarding the above results.

## 2016-08-23 ENCOUNTER — Inpatient Hospital Stay: Payer: BLUE CROSS/BLUE SHIELD

## 2016-08-23 LAB — COMPREHENSIVE METABOLIC PANEL
ALT: 13 U/L — ABNORMAL LOW (ref 14–54)
ANION GAP: 6 (ref 5–15)
AST: 16 U/L (ref 15–41)
Albumin: 1.6 g/dL — ABNORMAL LOW (ref 3.5–5.0)
Alkaline Phosphatase: 114 U/L (ref 38–126)
BUN: 6 mg/dL (ref 6–20)
CALCIUM: 7.6 mg/dL — AB (ref 8.9–10.3)
CHLORIDE: 100 mmol/L — AB (ref 101–111)
CO2: 30 mmol/L (ref 22–32)
Creatinine, Ser: 0.33 mg/dL — ABNORMAL LOW (ref 0.44–1.00)
Glucose, Bld: 91 mg/dL (ref 65–99)
Potassium: 2.8 mmol/L — ABNORMAL LOW (ref 3.5–5.1)
Sodium: 136 mmol/L (ref 135–145)
TOTAL PROTEIN: 5.1 g/dL — AB (ref 6.5–8.1)
Total Bilirubin: 0.4 mg/dL (ref 0.3–1.2)

## 2016-08-23 LAB — CBC
HCT: 28.9 % — ABNORMAL LOW (ref 35.0–47.0)
Hemoglobin: 9.1 g/dL — ABNORMAL LOW (ref 12.0–16.0)
MCH: 25.5 pg — AB (ref 26.0–34.0)
MCHC: 31.6 g/dL — AB (ref 32.0–36.0)
MCV: 80.8 fL (ref 80.0–100.0)
PLATELETS: 711 10*3/uL — AB (ref 150–440)
RBC: 3.57 MIL/uL — ABNORMAL LOW (ref 3.80–5.20)
RDW: 23.4 % — AB (ref 11.5–14.5)
WBC: 23.4 10*3/uL — AB (ref 3.6–11.0)

## 2016-08-23 MED ORDER — ENOXAPARIN SODIUM 40 MG/0.4ML ~~LOC~~ SOLN
40.0000 mg | SUBCUTANEOUS | Status: DC
  Administered 2016-08-24 – 2016-09-14 (×22): 40 mg via SUBCUTANEOUS
  Filled 2016-08-23 (×22): qty 0.4

## 2016-08-23 MED ORDER — FENTANYL CITRATE (PF) 100 MCG/2ML IJ SOLN
INTRAMUSCULAR | Status: AC | PRN
  Administered 2016-08-23: 50 ug via INTRAVENOUS

## 2016-08-23 MED ORDER — MIDAZOLAM HCL 5 MG/5ML IJ SOLN
INTRAMUSCULAR | Status: AC
  Filled 2016-08-23: qty 5

## 2016-08-23 MED ORDER — MIDAZOLAM HCL 5 MG/5ML IJ SOLN
INTRAMUSCULAR | Status: AC | PRN
  Administered 2016-08-23 (×2): 1 mg via INTRAVENOUS

## 2016-08-23 MED ORDER — ONDANSETRON HCL 4 MG/2ML IJ SOLN
INTRAMUSCULAR | Status: AC
  Filled 2016-08-23: qty 2

## 2016-08-23 MED ORDER — SODIUM CHLORIDE 0.9% FLUSH
5.0000 mL | Freq: Three times a day (TID) | INTRAVENOUS | Status: DC
  Administered 2016-08-24 – 2016-08-25 (×6): 5 mL

## 2016-08-23 MED ORDER — FENTANYL CITRATE (PF) 100 MCG/2ML IJ SOLN
INTRAMUSCULAR | Status: AC | PRN
  Administered 2016-08-23 (×3): 50 ug via INTRAVENOUS

## 2016-08-23 MED ORDER — SODIUM CHLORIDE 0.9 % IV SOLN
60.0000 mL | Freq: Two times a day (BID) | INTRAVENOUS | Status: DC
  Administered 2016-08-23: 50 mL via INTRAVENOUS
  Filled 2016-08-23 (×5): qty 100

## 2016-08-23 MED ORDER — MIDAZOLAM HCL 5 MG/5ML IJ SOLN
INTRAMUSCULAR | Status: AC | PRN
  Administered 2016-08-23: 1 mg via INTRAVENOUS

## 2016-08-23 MED ORDER — MIDAZOLAM HCL 2 MG/2ML IJ SOLN
INTRAMUSCULAR | Status: AC | PRN
  Administered 2016-08-23 (×2): 1 mg via INTRAVENOUS

## 2016-08-23 MED ORDER — FENTANYL CITRATE (PF) 100 MCG/2ML IJ SOLN
INTRAMUSCULAR | Status: AC
  Filled 2016-08-23: qty 4

## 2016-08-23 MED ORDER — POTASSIUM CHLORIDE CRYS ER 20 MEQ PO TBCR
40.0000 meq | EXTENDED_RELEASE_TABLET | Freq: Two times a day (BID) | ORAL | Status: DC
  Administered 2016-08-23 – 2016-09-14 (×43): 40 meq via ORAL
  Filled 2016-08-23 (×46): qty 2

## 2016-08-23 MED ORDER — SODIUM CHLORIDE 0.9 % IV SOLN
INTRAVENOUS | Status: AC | PRN
  Administered 2016-08-23: 10 mL/h via INTRAVENOUS

## 2016-08-23 MED ORDER — POTASSIUM CHLORIDE 2 MEQ/ML IV SOLN
30.0000 meq | Freq: Once | INTRAVENOUS | Status: AC
  Administered 2016-08-23: 30 meq via INTRAVENOUS
  Filled 2016-08-23: qty 15

## 2016-08-23 NOTE — Progress Notes (Signed)
OT Cancellation Note  Patient Details Name: Lindsey Wolf MRN: [id number] DOB: 09-06-1965   Cancelled Treatment:    Reason Eval/Treat Not Completed: Patient at procedure or test/ unavailable. Pt preparing for transport out of room for testing. Will re-attempt OT treatment at later date/time as pt is available.  Jeni Salles, MPH, MS, OTR/L ascom [phone] 08/23/16, 1:46 PM

## 2016-08-23 NOTE — Progress Notes (Signed)
Patient ID: Lindsey Wolf, female   DOB: Sep 11, 1965, 51 y.o.   MRN: [id number]     Referring Physician(s): Pabon (Surgery) Faith Rogue (Thoracic Surgery)   Patient Status:  [facility] - In-pt  Chief Complaint:  Postop abdominal and chest abscess  Subjective:  History of perforated ulcer and 2 subsequent laparotomies complicated by development of intra-abdominal abscesses and empyema.   Patient is well-known to the interventional radiology Department as she went successful CT-guided left sided chest tube on 08/17/2016.  Un fortunately, CT scan of the chest, abdomen and pelvis performed 08/22/2016 demonstrates interval enlargement in size of the perisplenic fluid collection within the left upper abdominal quadrant well as a persistent indeterminate predominantly air-containing action within the mid abdomen.  Request has been made for attempted CT-guided percutaneous drainage catheter placement into either or both of these collections.  Additionally, while the left-sided chest tube is well positioned, there is been little output from drain recently and as such, Dr. Faith Rogue has requested for image guided chest tube exchange and potential upsizing.  Allergies: Patient has no known allergies.  Medications: Prior to Admission medications   Medication Sig Start Date End Date Taking? Authorizing Provider  ALPRAZolam (XANAX) 0.25 MG tablet Take 0.25 mg by mouth 3 (three) times daily as needed.  06/14/16  Yes Historical Provider, MD  FLUoxetine (PROZAC) 20 MG capsule Take 40 mg by mouth every morning. 06/15/16  Yes Historical Provider, MD     Vital Signs: BP 111/65 (BP Location: Right Arm)   Pulse 86   Temp 98.4 F (36.9 C) (Oral)   Resp 16   Ht 5\' 10"  (1.778 m)   Wt 126 lb 6.4 oz (57.3 kg)   SpO2 95%   BMI 18.14 kg/m   Physical Exam  Constitutional: She appears well-developed.  HENT:  Head: Normocephalic and atraumatic.  Cardiovascular: Normal rate and regular rhythm.   Pulmonary/Chest:   Decreased left sided breath sounds.  Skin: Skin is warm and dry.  Nursing note and vitals reviewed.   Imaging: Dg Chest 1 View  Result Date: 08/22/2016 CLINICAL DATA:  Recent empyema with chest tube placement EXAM: CHEST 1 VIEW COMPARISON:  August 20, 2016 FINDINGS: Chest tubes are again noted on the left without pneumothorax. There is persistent consolidation in the left base with small left pleural effusion. There is apical pleural thickening on the left, stable. There is mild atelectasis in the right base. Central catheter tip is at the cavoatrial junction. Heart is upper normal in size with pulmonary vascularity within normal limits. No adenopathy. No evident bone lesions. IMPRESSION: Tube and catheter positions as described without evident pneumothorax. Consolidation with small pleural effusion left base persists. Mild right base atelectasis. No new opacity. Stable cardiac silhouette. Electronically Signed   By: Lowella Grip III M.D.   On: 08/22/2016 07:07   Ct Chest W Contrast  Result Date: 08/22/2016 CLINICAL DATA:  History of perforated ulcer and prior laparotomy with subsequent empyema and thoracotomy with drainage, continued elevated white blood cell count EXAM: CT CHEST, ABDOMEN, AND PELVIS WITH CONTRAST TECHNIQUE: Multidetector CT imaging of the chest, abdomen and pelvis was performed following the standard protocol during bolus administration of intravenous contrast. CONTRAST:  170mL ISOVUE-300 IOPAMIDOL (ISOVUE-300) INJECTION 61% COMPARISON:  08/16/2016, 08/22/2016 FINDINGS: CT CHEST FINDINGS Cardiovascular: The thoracic aorta demonstrates atherosclerotic calcifications without aneurysmal dilatation. No dissection is seen. The pulmonary artery as visualized is within normal limits. No cardiac enlargement is seen. No significant coronary calcifications are noted. Mediastinum/Nodes: The thoracic inlet  is within normal limits. No significant hilar or mediastinal adenopathy is identified.  The esophagus as visualized is within normal limits. Lungs/Pleura: The right lung is well aerated. Only minimal fluid is noted as well as some mild basilar atelectasis. This overall appearance is improved when compared with the prior exam. On the left, multiple thoracostomy catheters are again identified and stable. No pneumothorax is seen. A new pigtail catheter is noted within the inferior empyema which has reduced in size significantly from the prior exam. Continued drainage is advised. Musculoskeletal: No acute bony abnormality is noted. CT ABDOMEN PELVIS FINDINGS Hepatobiliary: The liver and gallbladder are unchanged in appearance. 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 Pancreas: Unremarkable. No pancreatic ductal dilatation or surrounding inflammatory changes. Spleen: Stable medial subcapsular fluid collection is identified. It abuts the posterior wall of the stomach. The spleen is otherwise within normal limits. Adrenals/Urinary Tract: Adrenal glands are unremarkable. Kidneys are normal, without renal calculi, focal lesion, or hydronephrosis. Bladder is unremarkable. 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 No obstructive changes are noted. Vascular/Lymphatic: Aortic atherosclerosis. No enlarged abdominal or pelvic lymph nodes. Reproductive: Uterus is well visualized. A few small foci of air are again identified similar to that noted on previous exams but of uncertain significance. Other: Surgical drain in the lesser sac is again identified. No significant fluid collection is identified. A previously seen drain in the subcutaneous tissues has been removed. Some air is noted in this region. Better visualized on the current exam due to improved opacification of the small bowel is some inflammatory change and wall thickening in the distal small bowel as well as what appear to be two air-fluid collections in the mid right abdomen. One of these is noted on image  number 92 of series 2 with a small amount of fluid within. This previously have the appearance of bowel and may still represent a large diverticulum. The second area is more inferior best seen on image number 108 of series 2. Possibility of postoperative abscess is deserves consideration. No significant free pelvic fluid is noted. Musculoskeletal: No acute bony abnormality is noted. IMPRESSION: CT of the chest: Interval reduction in empyema on the left when compared with the prior exam following drainage. CT of the abdomen and pelvis: Stable appearing splenic subcapsular fluid collection. Better visualized on the current examination due to improved opacification of the distal small bowel are too air fluid collections as described above. Possibility of postoperative abscess deserves consideration. These results will be called to the ordering clinician or representative by the Radiologist Assistant, and communication documented in the PACS or zVision Dashboard. Electronically Signed   By: Inez Catalina M.D.   On: 08/22/2016 13:53   Ct Abdomen Pelvis W Contrast  Result Date: 08/22/2016 CLINICAL DATA:  History of perforated ulcer and prior laparotomy with subsequent empyema and thoracotomy with drainage, continued elevated white blood cell count EXAM: CT CHEST, ABDOMEN, AND PELVIS WITH CONTRAST TECHNIQUE: Multidetector CT imaging of the chest, abdomen and pelvis was performed following the standard protocol during bolus administration of intravenous contrast. CONTRAST:  137mL ISOVUE-300 IOPAMIDOL (ISOVUE-300) INJECTION 61% COMPARISON:  08/16/2016, 08/22/2016 FINDINGS: CT CHEST FINDINGS Cardiovascular: The thoracic aorta demonstrates atherosclerotic calcifications without aneurysmal dilatation. No dissection is seen. The pulmonary artery as visualized is within normal limits. No cardiac enlargement is seen. No significant coronary calcifications are noted. Mediastinum/Nodes: The thoracic inlet is within normal limits.  No significant hilar or mediastinal adenopathy is identified. The esophagus as visualized is within  normal limits. Lungs/Pleura: The right lung is well aerated. Only minimal fluid is noted as well as some mild basilar atelectasis. This overall appearance is improved when compared with the prior exam. On the left, multiple thoracostomy catheters are again identified and stable. No pneumothorax is seen. A new pigtail catheter is noted within the inferior empyema which has reduced in size significantly from the prior exam. Continued drainage is advised. Musculoskeletal: No acute bony abnormality is noted. CT ABDOMEN PELVIS FINDINGS Hepatobiliary: The liver and gallbladder are unchanged in appearance. 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 Pancreas: Unremarkable. No pancreatic ductal dilatation or surrounding inflammatory changes. Spleen: Stable medial subcapsular fluid collection is identified. It abuts the posterior wall of the stomach. The spleen is otherwise within normal limits. Adrenals/Urinary Tract: Adrenal glands are unremarkable. Kidneys are normal, without renal calculi, focal lesion, or hydronephrosis. Bladder is unremarkable. 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 No obstructive changes are noted. Vascular/Lymphatic: Aortic atherosclerosis. No enlarged abdominal or pelvic lymph nodes. Reproductive: Uterus is well visualized. A few small foci of air are again identified similar to that noted on previous exams but of uncertain significance. Other: Surgical drain in the lesser sac is again identified. No significant fluid collection is identified. A previously seen drain in the subcutaneous tissues has been removed. Some air is noted in this region. Better visualized on the current exam due to improved opacification of the small bowel is some inflammatory change and wall thickening in the distal small bowel as well as what appear to be two air-fluid collections  in the mid right abdomen. One of these is noted on image number 92 of series 2 with a small amount of fluid within. This previously have the appearance of bowel and may still represent a large diverticulum. The second area is more inferior best seen on image number 108 of series 2. Possibility of postoperative abscess is deserves consideration. No significant free pelvic fluid is noted. Musculoskeletal: No acute bony abnormality is noted. IMPRESSION: CT of the chest: Interval reduction in empyema on the left when compared with the prior exam following drainage. CT of the abdomen and pelvis: Stable appearing splenic subcapsular fluid collection. Better visualized on the current examination due to improved opacification of the distal small bowel are too air fluid collections as described above. Possibility of postoperative abscess deserves consideration. These results will be called to the ordering clinician or representative by the Radiologist Assistant, and communication documented in the PACS or zVision Dashboard. Electronically Signed   By: Inez Catalina M.D.   On: 08/22/2016 13:53   Dg Chest Port 1 View  Result Date: 08/20/2016 CLINICAL DATA:  Chest tube. EXAM: PORTABLE CHEST 1 VIEW COMPARISON:  08/18/2016 . FINDINGS: Right PICC line, left chest tubes in stable position. Heart size normal. Bibasilar atelectasis again noted. Small bilateral pleural effusions. Stable left apical pleural thickening. No pneumothorax. Staples noted over the left chest. IMPRESSION: 1. Left chest tubes in stable position.  No pneumothorax. 2. Bibasilar atelectasis again noted. Small bilateral pleural effusions. Electronically Signed   By: Marcello Moores  Register   On: 08/20/2016 07:02    Labs:  CBC:  Recent Labs  08/19/16 0825 08/20/16 0423 08/22/16 0550 08/23/16 0605  WBC 20.8* 17.4* 33.2* 23.4*  HGB 9.7* 9.5* 10.1* 9.1*  HCT 31.4* 29.0* 32.4* 28.9*  PLT 894* 755* 740* 711*    COAGS:  Recent Labs  07/20/16 1507  07/30/16 0410  INR 1.19 1.37    BMP:  Recent Labs  08/19/16 0825 08/20/16 0423 08/22/16 1039 08/23/16 0605  NA 136 137 137 136  K 3.6 3.7 3.1* 2.8*  CL 103 104 101 100*  CO2 28 28 30 30   GLUCOSE 94 93 128* 91  BUN 5* 7 8 6   CALCIUM 7.9* 7.9* 8.1* 7.6*  CREATININE 0.32* <0.30* 0.41* 0.33*  GFRNONAA >60 NOT CALCULATED >60 >60  GFRAA >60 NOT CALCULATED >60 >60    LIVER FUNCTION TESTS:  Recent Labs  08/03/16 0348 08/06/16 1211 08/08/16 0607 08/18/16 0416 08/23/16 0605  BILITOT <0.1*  --  0.5 0.5 0.4  AST 36  --  27 20 16   ALT 19  --  18 17 13*  ALKPHOS 155*  --  202* 135* 114  PROT 4.3*  --  5.4* 5.1* 5.1*  ALBUMIN 1.1* 1.2* 1.4* 1.5* 1.6*    Assessment and Plan:  Unfortunately, CT scan of the chest, abdomen and pelvis performed 08/22/2016 demonstrates interval enlargement in size of the perisplenic fluid collection within the left upper abdominal quadrant well as a persistent indeterminate predominantly air-containing action within the mid abdomen.  Request has been made for attempted CT-guided percutaneous drainage catheter placement into either or both of these collections.  Additionally, while the left-sided chest tube is well positioned, there is been little output from drain recently and as such, Dr. Faith Rogue has requested for image guided chest tube exchange and potential upsizing.  Risks and Benefits of CT-guided abdominal drain(s) and image guided chest tube exchange and upsizing were discussed with the patient including bleeding, infection, damage to adjacent structures, bowel perforation/fistula connection, and sepsis.  All of the patient's questions were answered, patient is agreeable to proceed.  Consent signed and in chart.   Electronically Signed: Sandi Mariscal 08/23/2016, 2:28 PM   I spent a total of 15 Minutes at the the patient's bedside AND on the patient's hospital floor or unit, greater than 50% of which was counseling/coordinating care for  CT-guided percutaneous drainage catheter placement and image guided the sided chest tube exchange and upsizing.

## 2016-08-23 NOTE — Progress Notes (Signed)
MD was notified of adapter being removed when radiologist exchanged 12 Fr to 14 Fr, no way to flush chest tube now, no new orders were given.

## 2016-08-23 NOTE — OR Nursing (Signed)
Reported nausea after transferred to bed Zofran iv given on arrival to specials recovery

## 2016-08-23 NOTE — Procedures (Signed)
Pre procedural Dx: Empyema & Post-op abscess/fluid collection Post procedural Dx: Same  - Technically successful CT guided placed of a 10 Fr drainage catheter placement into the left perisplenic space yielding 15 cc of purulent material.  All aspirated samples sent to the laboratory for analysis.     -Technically successful CT guided placed of a 10 Fr drainage catheter placement into the indeterminate air collection within the mid abdomen.  - Technically successful CT guided exchange and up sizing of now 14 Fr left sided chest tube.   EBL: Minimal  Complications: None immediate  Ronny Bacon, MD Pager #: [phone]

## 2016-08-23 NOTE — Progress Notes (Signed)
Doing better WBC improving after PIC removed D/W IR and they will attempt to drain collection next to the spleen that is enlarging Afebrile and taking PO  PE NAD Chest : CT in place and pigtail w seropurulent fluid Abd: soft, NT, vac in place JP w some cloudy fluid, no peritonitis  A/P Continue broad spectrum a/b I do think that her increase wb was related to picc infection Pending [facility]

## 2016-08-23 NOTE — Consult Note (Signed)
Pharmacist consulted to review PTA and Inpatient med lists for interrupted anticoagulation / antiplatelet orders due to IR procedure. Bleeding risk associated with procedure marked as standard. Pt was on enoxaparin prior to procedure. Will resume enoxaparin 40mg  q 24 hours tomorrow AM per protocol.  Ramond Dial, Pharm.D, BCPS Clinical Pharmacist

## 2016-08-23 NOTE — Progress Notes (Signed)
Physical Therapy Treatment Patient Details Name: Lindsey Wolf MRN: [id number] DOB: 04-08-1966 Today's Date: 08/23/2016    History of Present Illness Pt is a 51 year old female status post complicated repair of peptic ulcer disease. She later underwent a repeat laparotomy for drainage of intra-abdominal abscesses. Pt diagnosed with Acute hypoxic respiratory failure with L chest tube placement, septic shock with hypotension, and A-fib with RVR.  Pt also has multiple JP drains and was intubated but is now extubated. Pt underwent L thoracotomy and thoracoscopy along with debridement of abdominal wall abscess on 08/08/16.  Pt now with B chest tubes, JP drains, and wound vac. Of note, small pneumothorax per chart review.  Patient underwent wound vac placement and debridement of abd wound 08/12/16. Pt now has several chest tubes draining to foley bag and additional chest tube to wall suction.    PT Comments    Pt is making gradual progress towards goals and reports she had just finished ambulating around RN station prior to PT arrival. Pt fatigued at this time. Gave written HEP and reviewed with pt. All questions addressed and pt reports she will continue to perform HEP on own time. Will continue to assess. Pt really wants to discharge home if able, discussed progression.   Follow Up Recommendations  SNF     Equipment Recommendations  Rolling walker with 5" wheels    Recommendations for Other Services       Precautions / Restrictions Precautions Precautions: Fall Restrictions Weight Bearing Restrictions: No    Mobility  Bed Mobility               General bed mobility comments: Pt requested to not perform mobility this date as she was getting ready to go out for a test and had just finished ambulating.  Transfers                    Ambulation/Gait                 Stairs            Wheelchair Mobility    Modified Rankin (Stroke Patients Only)        Balance                                    Cognition Arousal/Alertness: Awake/alert Behavior During Therapy: WFL for tasks assessed/performed Overall Cognitive Status: Within Functional Limits for tasks assessed                      Exercises Other Exercises Other Exercises: Reviewed written HEP and educated on instruction including ankle pumps, hip add squeezes, LAQ, sit<>stand, bridging, and glut sets. Pt reports she will be able to perform 2-3xs/day. All questions addressed.    General Comments        Pertinent Vitals/Pain Pain Assessment: No/denies pain    Home Living                      Prior Function            PT Goals (current goals can now be found in the care plan section) Acute Rehab PT Goals Patient Stated Goal: To return home, and to be able to pay her bills. PT Goal Formulation: With patient Time For Goal Achievement: 09/06/16 Potential to Achieve Goals: Good Progress towards PT goals: Progressing toward goals    Frequency  Min 2X/week      PT Plan Current plan remains appropriate    Co-evaluation             End of Session   Activity Tolerance: Patient limited by fatigue Patient left: in bed Nurse Communication: Mobility status PT Visit Diagnosis: Muscle weakness (generalized) (M62.81);Difficulty in walking, not elsewhere classified (R26.2)     Time: 7425-9563 PT Time Calculation (min) (ACUTE ONLY): 12 min  Charges:  $Therapeutic Exercise: 8-22 mins                    G Codes:       [name] 09-09-16, 12:55 PM Greggory Stallion, PT, DPT [phone]

## 2016-08-24 ENCOUNTER — Encounter: Payer: Self-pay | Admitting: Surgery

## 2016-08-24 ENCOUNTER — Encounter: Payer: Self-pay | Admitting: Cardiothoracic Surgery

## 2016-08-24 LAB — MAGNESIUM: Magnesium: 1.7 mg/dL (ref 1.7–2.4)

## 2016-08-24 LAB — CBC
HCT: 28.6 % — ABNORMAL LOW (ref 35.0–47.0)
Hemoglobin: 9.2 g/dL — ABNORMAL LOW (ref 12.0–16.0)
MCH: 26 pg (ref 26.0–34.0)
MCHC: 32.1 g/dL (ref 32.0–36.0)
MCV: 81 fL (ref 80.0–100.0)
PLATELETS: 704 10*3/uL — AB (ref 150–440)
RBC: 3.53 MIL/uL — ABNORMAL LOW (ref 3.80–5.20)
RDW: 22.9 % — AB (ref 11.5–14.5)
WBC: 16.6 10*3/uL — AB (ref 3.6–11.0)

## 2016-08-24 LAB — BASIC METABOLIC PANEL
Anion gap: 8 (ref 5–15)
BUN: 5 mg/dL — AB (ref 6–20)
CALCIUM: 8.1 mg/dL — AB (ref 8.9–10.3)
CO2: 30 mmol/L (ref 22–32)
Chloride: 103 mmol/L (ref 101–111)
Creatinine, Ser: 0.39 mg/dL — ABNORMAL LOW (ref 0.44–1.00)
GFR calc Af Amer: >60 mL/min (ref >60)
Glucose, Bld: 104 mg/dL — ABNORMAL HIGH (ref 65–99)
Potassium: 3.8 mmol/L (ref 3.5–5.1)
SODIUM: 141 mmol/L (ref 135–145)

## 2016-08-24 MED ORDER — SODIUM CHLORIDE 0.9% FLUSH
20.0000 mL | Freq: Two times a day (BID) | INTRAVENOUS | Status: DC
  Administered 2016-08-24 – 2016-09-02 (×17): 20 mL

## 2016-08-24 NOTE — Clinical Social Work Note (Signed)
Patient has a bed at [facility] once patient is medically ready for discharge and orders have been received.  Insurance authorization is good till 08-28-16, CSW to continue to follow patient's progress throughout discharge planning.  Jones Broom. Jessie, MSW, [place]  08/24/2016 5:15 PM

## 2016-08-24 NOTE — Progress Notes (Signed)
Nutrition Follow-up  DOCUMENTATION CODES:   Severe malnutrition in context of acute illness/injury  INTERVENTION:  Discussed nutrition plan of care with Dr. Dahlia Byes. Patient will require some form of nutrition support as she is unable to meet her needs with PO intake and is severely malnourished. It has been 13 days of inadequate intake (meeting mainly <20-25% of energy needs). Patient may require NPO for bowel rest and in that case will require TPN. If she does not require bowel rest, would benefit from placement of small-bore feeding tube for initiation of enteral nutrition. Discussed option of placing in IR so it can be placed distal to section of bowel that is concerning for possible abscess or fistula.   Continue Ensure Enlive po TID, each supplement provides 350 kcal and 20 grams of protein. Patient is only drinking 1/2-1 of these daily at this point and reports she cannot do any better. RD has also offered to switch to another oral nutrition supplement but patient reports strawberry Ensure is her favorite and she is doing the best she can.  RD also offered to order snacks for patient between meals but she refuses at this time reporting she cannot eat any more than she is eating at this time.  NUTRITION DIAGNOSIS:   Inadequate oral intake related to poor appetite, acute illness as evidenced by per patient/family report, meal completion < 50%.  Ongoing.  GOAL:   Patient will meet greater than or equal to 90% of their needs  Not met.  MONITOR:   I & O's, Diet advancement, Labs, Weight trends, Supplement acceptance  REASON FOR ASSESSMENT:   Malnutrition Screening Tool    ASSESSMENT:   Lindsey Wolf is a 51 y.o. female with a 3 days hx of abdominal pain in the epigastric area and she thinks it was a gas type. Pain is moderate to severe in intensity and has worsening today.  -On 3/22 s/p CT guided placement of 10 Fr drainage catheter placement. One catheter placed in left  perisplenic space and another placed into anterior abdominal wall draining interloop collection. Also had CT guided exchange and upsizing of left sided chest tube to 14 Fr. -PICC was removed due to concern it may have been causing an infection (WBC had been trending up)  Spoke with patient at bedside. Patient reports her appetite remains poor and she is unable to eat well. She endorses abdominal pain. She was NPO yesterday leading up to procedures in IR. She ordered dinner but reports she was unable to eat any of it. She did not have any of her Ensure yesterday. Patient reports today she has only had 2 hard boiled eggs and some grape juice. She has been give 2 bottles of Ensure today, but has not had any yet. One bottle was in a cup with ice, but patient has not started to drink yet.  Intake: In the past 24 hours patient has had approximately 272 kcal (16% minimum estimated kcal needs) and 12 grams of protein (14% minimum estimated protein needs).   Medications reviewed and include: Medline mouth rinse, pantoprazole, Zosyn, Miralax, potassium chloride 40 mEq BID, Dilaudid PRN, Ativan PRN.  Labs reviewed: BUN 5, Creatinine 0.39  I/O: 580 ml urine output plus one unmeasured occurrence, 265 ml from JP drains and wound vac, 78 ml from chest tube.   Weight trend: weight continues to fluctuate and is difficult to trend. Patient may benefit from weights on zeroed standing scale to have more accurate trend of weights.  Discussed  with RN. RN reports patient also has a black ulcer on left side of her mouth, which may also be impacting intake.   Diet Order:  Diet regular Room service appropriate? Yes; Fluid consistency: Thin  Skin:  Wound (see comment) (stage II pressure ulcer in nare)  Last BM:  08/12/2016  Height:   Ht Readings from Last 1 Encounters:  08/08/16 '5\' 10"'  (1.778 m)    Weight:   Wt Readings from Last 1 Encounters:  08/24/16 126 lb 12.8 oz (57.5 kg)    Ideal Body Weight:  68.18  kg  BMI:  Body mass index is 18.19 kg/m.  Estimated Nutritional Needs:   Kcal:  1700-1900 (MSJ x 1.3-1.5)  Protein:  85-100 grams (1.5-1.8 grams/kg)  Fluid:  1.7-1.9 L/day (30-35 ml/kg)  EDUCATION NEEDS:   Education needs addressed (High-Calorie, High-Protein Nutrition Therapy)  Willey Blade, MS, RD, LDN Pager: [phone] After Hours Pager: [phone]

## 2016-08-24 NOTE — Progress Notes (Signed)
ANTIBIOTIC/ANTIFUnGAL CONSULT NOTE - FOLLOW UP  Pharmacy Consult for Zosyn and Eraxis Indication: intra-abdominal infection  No Known Allergies  Patient Measurements: Height: 5\' 10"  (177.8 cm) Weight: 126 lb 12.8 oz (57.5 kg) IBW/kg (Calculated) : 68.5  Vital Signs: Temp: 97.7 F (36.5 C) (03/23 0501) Temp Source: Oral (03/23 0501) BP: 119/76 (03/23 0501) Pulse Rate: 83 (03/23 0501) Intake/Output from previous day: 03/22 0701 - 03/23 0700 In: 0  Out: 923 [Urine:580; Drains:265; Chest Tube:78] Intake/Output from this shift: No intake/output data recorded.  Labs:  Recent Labs  08/22/16 0550 08/22/16 1039 08/23/16 0605  WBC 33.2*  --  23.4*  HGB 10.1*  --  9.1*  PLT 740*  --  711*  CREATININE  --  0.41* 0.33*   Estimated Creatinine Clearance: 77.2 mL/min (A) (by C-G formula based on SCr of 0.33 mg/dL (L)).  Assessment: Pharmacy consulted to dose piperacillin/tazobactam and Eraxis in this 51 year old female with intrabdominal infection post perforated peptic ulcer. ID following.  Plan:  Continue Zosyn 3.375 g IV q8 hours and Eraxis 100 mg IV q24 hours.   Lenis Noon, PharmD Clinical Pharmacist 08/24/2016,10:26 AM

## 2016-08-24 NOTE — Progress Notes (Signed)
s/p perforated ulcer and laparotomy x 2 Empyema s/p thoracotomy and decortication s/p placement of two new abd drain, one on anterior abdominal wall draining an interloop collection and another on torso draining perisplenic abscess s/p exchange of chest pigtail  She is feeling well and taking PO  PE NAD Chest: tubes in place w some thinning of output Abd: soft, nt no peritonitis. New JP w some bilious material makes me question if this was actually a segment of sb that was interpreted as an abscess. In any situation the output is low and controlled so I would not change anything. Torso drain with purulent fluid.  Right sided JP some cloudy fluid  Pending labs this am  A/P  Continue current regimen of A/Bs I think that the new drain that IR place it is actually draining bile but this is low output and will not change rx for now and may need to go back to NPO and TPN depending on how it behaves No surgical intervention PT

## 2016-08-24 NOTE — Progress Notes (Signed)
[place] INFECTIOUS DISEASE PROGRESS NOTE Date of Admission:  07/20/2016     ID: Eliseo Gum Panos is a 51 y.o. female with  intrabdominal infection post perforated peptic ulcer Active Problems:   Perforated ulcer ([place])   Abdominal abscess ([place])   Abnormal respirations   Pleural effusion   Respiratory distress   Empyema of left pleural space (HCC)   Protein-calorie malnutrition, severe   Subjective: New drain placed 3/22 with 15 cc purulent material drained - had upsized chest drain as well. No fevers  ROS  Eleven systems are reviewed and negative except per hpi  Medications:  Antibiotics Given (last 72 hours)    Date/Time Action Medication Dose Rate   08/21/16 2031 Given   piperacillin-tazobactam (ZOSYN) IVPB 3.375 g 3.375 g 12.5 mL/hr   08/22/16 0516 Given   piperacillin-tazobactam (ZOSYN) IVPB 3.375 g 3.375 g 12.5 mL/hr   08/22/16 1431 Given   piperacillin-tazobactam (ZOSYN) IVPB 3.375 g 3.375 g 12.5 mL/hr   08/22/16 2137 Given   piperacillin-tazobactam (ZOSYN) IVPB 3.375 g 3.375 g 12.5 mL/hr   08/23/16 0552 Given   piperacillin-tazobactam (ZOSYN) IVPB 3.375 g 3.375 g 12.5 mL/hr   08/23/16 2111 Given   piperacillin-tazobactam (ZOSYN) IVPB 3.375 g 3.375 g 12.5 mL/hr   08/24/16 6433 Given   piperacillin-tazobactam (ZOSYN) IVPB 3.375 g 3.375 g 12.5 mL/hr   08/24/16 1345 Given   piperacillin-tazobactam (ZOSYN) IVPB 3.375 g 3.375 g 12.5 mL/hr     . anidulafungin  100 mg Intravenous Q24H  . enoxaparin (LOVENOX) injection  40 mg Subcutaneous Q24H  . feeding supplement (ENSURE ENLIVE)  237 mL Oral TID BM  . FLUoxetine  40 mg Oral q morning - 10a  . mouth rinse  15 mL Mouth Rinse BID  . nystatin  5 mL Oral QID  . pantoprazole (PROTONIX) IV  40 mg Intravenous Q12H  . piperacillin-tazobactam (ZOSYN)  IV  3.375 g Intravenous Q8H  . polyethylene glycol  17 g Oral Daily  . potassium chloride  40 mEq Oral BID  . sodium chloride flush  20 mL Intracatheter Q12H  . sodium  chloride flush  5 mL Intracatheter TID  . sodium chloride  60 mL Intravenous Q12H    Objective: Vital signs in last 24 hours: Temp:  [97.7 F (36.5 C)-99.4 F (37.4 C)] 98 F (36.7 C) (03/23 1407) Pulse Rate:  [83-107] 107 (03/23 1407) Resp:  [12-23] 17 (03/23 0501) BP: (81-119)/(50-76) 106/59 (03/23 1407) SpO2:  [92 %-100 %] 97 % (03/23 1407) Weight:  [57.5 kg (126 lb 12.8 oz)] 57.5 kg (126 lb 12.8 oz) (03/23 0500) Constitutional:  Ill appearing HENT: [place]/AT, PERRLA, no scleral icterus Mouth/Throat: Oropharynx is clear and dry. No oropharyngeal exudate.  Cardiovascular: Normal rate, regular rhythm and normal heart sounds.  Pulmonary/Chest: bil rhonchi Bil chest tubes with ss drainage Neck = supple, no nuchal rigidity Abdominal: Soft. abd mildly distended, inciision along midline covered with wound vac 1 JP drains in place on  R - with purulent material, 1 jp L with bilous drainage, L chest drain with purulent materia.  Lymphadenopathy: no cervical adenopathy. No axillary adenopathy Neurological: alert and oriented to person, place, and time.  Skin: Skin is warm and dry. No rash noted. No erythema.  PICC RUE wnl wnl Psychiatric: a normal mood and affect.  behavior is normal.   Lab Results  Recent Labs  08/23/16 0605 08/24/16 1144  WBC 23.4* 16.6*  HGB 9.1* 9.2*  HCT 28.9* 28.6*  NA 136 141  K 2.8* 3.8  CL 100* 103  CO2 30 30  BUN 6 5*  CREATININE 0.33* 0.39*    Microbiology: Results for orders placed or performed during the hospital encounter of 07/20/16  [place] rt PCR ([place] only)     Status: None   Collection Time: 07/20/16  1:15 PM  Result Value Ref Range Status   Specimen source GC/Chlam ENDOCERVICAL  Final   Chlamydia Tr NOT DETECTED NOT DETECTED Final   N gonorrhoeae NOT DETECTED NOT DETECTED Final    Comment: (NOTE) 100  This methodology has not been evaluated in pregnant women or in 200  patients with a history of hysterectomy. 300 400  This  methodology will not be performed on patients less than 55  years of age.   Wet prep, genital     Status: Abnormal   Collection Time: 07/20/16  1:15 PM  Result Value Ref Range Status   Yeast Wet Prep HPF POC NONE SEEN NONE SEEN Final   Trich, Wet Prep NONE SEEN NONE SEEN Final   Clue Cells Wet Prep HPF POC NONE SEEN NONE SEEN Final   WBC, Wet Prep HPF POC RARE (A) NONE SEEN Final   Sperm NONE SEEN  Final  Blood culture (routine x 2)     Status: None   Collection Time: 07/20/16  3:07 PM  Result Value Ref Range Status   Specimen Description BLOOD left forearm  Final   Special Requests   Final    BOTTLES DRAWN AEROBIC AND ANAEROBIC AER12ML ANA12ML   Culture NO GROWTH 5 DAYS  Final   Report Status 07/25/2016 FINAL  Final  MRSA PCR Screening     Status: None   Collection Time: 07/20/16  9:29 PM  Result Value Ref Range Status   MRSA by PCR NEGATIVE NEGATIVE Final    Comment:        The GeneXpert MRSA Assay (FDA approved for NASAL specimens only), is one component of a comprehensive MRSA colonization surveillance program. It is not intended to diagnose MRSA infection nor to guide or monitor treatment for MRSA infections.   Aerobic/Anaerobic Culture (surgical/deep wound)     Status: None   Collection Time: 07/25/16  3:00 PM  Result Value Ref Range Status   Specimen Description ABSCESS RIGHT ABDOMEN  Final   Special Requests NONE  Final   Gram Stain   Final    RARE WBC PRESENT, PREDOMINANTLY PMN NO ORGANISMS SEEN    Culture   Final    No growth aerobically or anaerobically. Performed at [facility], [place] [address] [place], [place] [address]    Report Status 07/31/2016 FINAL  Final  Body fluid culture     Status: None   Collection Time: 07/30/16 11:40 AM  Result Value Ref Range Status   Specimen Description PLEURAL  Final   Special Requests NONE  Final   Gram Stain   Final    MODERATE WBC PRESENT, PREDOMINANTLY PMN NO ORGANISMS SEEN    Culture   Final    No  growth aerobically or anaerobically. Performed at [facility], [place] [address] [place], [place] [address]    Report Status 08/03/2016 FINAL  Final  Acid Fast Smear (AFB)     Status: None   Collection Time: 07/30/16 11:40 AM  Result Value Ref Range Status   AFB Specimen Processing Concentration  Final   Acid Fast Smear Negative  Final    Comment: (NOTE) Performed At: [facility]  [place], [place] [id number] Lindon Romp MD [id number]:[id number]    Source (AFB) PLEURAL  Final  Aerobic Culture (superficial specimen)     Status: None   Collection Time: 08/06/16  3:34 PM  Result Value Ref Range Status   Specimen Description ABDOMEN  Final   Special Requests Normal  Final   Gram Stain   Final    RARE WBC PRESENT,BOTH PMN AND MONONUCLEAR NO ORGANISMS SEEN Performed at [facility], [address] [place], [place] [address]    Culture RARE ESCHERICHIA COLI  Final   Report Status 08/09/2016 FINAL  Final   Organism ID, Bacteria ESCHERICHIA COLI  Final      Susceptibility   Escherichia coli - MIC*    AMPICILLIN <=2 SENSITIVE Sensitive     CEFAZOLIN <=4 SENSITIVE Sensitive     CEFEPIME <=1 SENSITIVE Sensitive     CEFTAZIDIME <=1 SENSITIVE Sensitive     CEFTRIAXONE <=1 SENSITIVE Sensitive     CIPROFLOXACIN <=0.25 SENSITIVE Sensitive     GENTAMICIN <=1 SENSITIVE Sensitive     IMIPENEM <=0.25 SENSITIVE Sensitive     TRIMETH/SULFA <=20 SENSITIVE Sensitive     AMPICILLIN/SULBACTAM <=2 SENSITIVE Sensitive     PIP/TAZO <=4 SENSITIVE Sensitive     Extended ESBL NEGATIVE Sensitive     * RARE ESCHERICHIA COLI  Fungus Culture With Stain     Status: None   Collection Time: 08/08/16  3:31 PM  Result Value Ref Range Status   Fungus Stain Final report  Final   Fungus (Mycology) Culture Preliminary report  Final    Comment: (NOTE) Performed At: [facility] [address] [place], [place] [id number] Lindon Romp MD [id number]    Fungal Source  ABDOMEN  Final    Comment: ABDOMINAL WALL WOUND  GOES WITH ACC [id number]   Fungus Culture With Stain     Status: None (Preliminary result)   Collection Time: 08/08/16  3:31 PM  Result Value Ref Range Status   Fungus Stain Final report  Final    Comment: (NOTE) Performed At: [facility] [place], [place] [id number] Lindon Romp MD [id number]    Fungus (Mycology) Culture PENDING  Incomplete   Fungal Source ABDOMEN  Final    Comment: ABDOMINAL WALL 2  GOES WITH ACC Q22297   Fungus Culture With Stain     Status: None (Preliminary result)   Collection Time: 08/08/16  3:31 PM  Result Value Ref Range Status   Fungus Stain Final report  Final    Comment: (NOTE) Performed At: [facility] [place], [place] [id number] Lindon Romp MD [id number]    Fungus (Mycology) Culture PENDING  Incomplete   Fungal Source PLEURAL  Final    Comment: RIGHT PLEURAL SPACE  Fungus Culture With Stain     Status: None (Preliminary result)   Collection Time: 08/08/16  3:31 PM  Result Value Ref Range Status   Fungus Stain Final report  Final    Comment: (NOTE) Performed At: [facility] [place], [place] [id number] Lindon Romp MD [id number]:[id number]    Fungus (Mycology) Culture PENDING  Incomplete   Fungal Source PLEURAL  Final    Comment: LEFT PLEURAL SPACE  Aerobic/Anaerobic Culture (surgical/deep wound)     Status: None   Collection Time: 08/08/16  3:31 PM  Result Value Ref Range Status   Specimen Description WOUND  Final   Special Requests ABDOMINAL WALL WOUND  Final  Gram Stain   Final    ABUNDANT WBC PRESENT, PREDOMINANTLY PMN RARE GRAM POSITIVE COCCI IN PAIRS Performed at [facility], [place] [address], [place], [place] [address]    Culture   Final    FEW ESCHERICHIA COLI RARE ACTINOMYCES ODONTOLYTICUS SUSCEPTIBILITIES PERFORMED ON PREVIOUS CULTURE WITHIN THE LAST 5 DAYS. MIXED ANAEROBIC FLORA PRESENT.   CALL LAB IF FURTHER IID REQUIRED.    Report Status 08/13/2016 FINAL  Final  Aerobic/Anaerobic Culture (surgical/deep wound)     Status: None   Collection Time: 08/08/16  3:31 PM  Result Value Ref Range Status   Specimen Description WOUND  Final   Special Requests ABDOMINAL WALL WOUND 2  Final   Gram Stain   Final    ABUNDANT WBC PRESENT, PREDOMINANTLY PMN RARE GRAM POSITIVE COCCI IN PAIRS FEW GRAM POSITIVE RODS    Culture   Final    RARE ESCHERICHIA COLI RARE ACTINOMYCES ODONTOLYTICUS FEW BACTEROIDES FRAGILIS RARE BACTEROIDES THETAIOTAOMICRON BETA LACTAMASE POSITIVE FOR BOTH BACTEROIDES SPECIES Performed at [facility], [place] [address] [place], [place] [address]    Report Status 08/13/2016 FINAL  Final   Organism ID, Bacteria ESCHERICHIA COLI  Final      Susceptibility   Escherichia coli - MIC*    AMPICILLIN 4 SENSITIVE Sensitive     CEFAZOLIN <=4 SENSITIVE Sensitive     CEFEPIME <=1 SENSITIVE Sensitive     CEFTAZIDIME <=1 SENSITIVE Sensitive     CEFTRIAXONE <=1 SENSITIVE Sensitive     CIPROFLOXACIN <=0.25 SENSITIVE Sensitive     GENTAMICIN <=1 SENSITIVE Sensitive     IMIPENEM <=0.25 SENSITIVE Sensitive     TRIMETH/SULFA <=20 SENSITIVE Sensitive     AMPICILLIN/SULBACTAM <=2 SENSITIVE Sensitive     PIP/TAZO <=4 SENSITIVE Sensitive     Extended ESBL NEGATIVE Sensitive     * RARE ESCHERICHIA COLI  Aerobic/Anaerobic Culture (surgical/deep wound)     Status: None   Collection Time: 08/08/16  3:31 PM  Result Value Ref Range Status   Specimen Description WOUND  Final   Special Requests RIGHT PLURAL SPACE  Final   Gram Stain   Final    FEW WBC PRESENT,BOTH PMN AND MONONUCLEAR NO ORGANISMS SEEN    Culture   Final    NO GROWTH 5 DAYS Performed at [facility], [address] [place] [place], [place] [address]    Report Status 08/13/2016 FINAL  Final  Aerobic/Anaerobic Culture (surgical/deep wound)     Status: None   Collection Time: 08/08/16  3:31 PM  Result Value  Ref Range Status   Specimen Description WOUND  Final   Special Requests LEFT PLERAL SPACE  Final   Gram Stain   Final    FEW WBC PRESENT,BOTH PMN AND MONONUCLEAR NO ORGANISMS SEEN    Culture   Final    MODERATE BACTEROIDES FRAGILIS BETA LACTAMASE POSITIVE Performed at [facility], [place] [address], [place],  [address]    Report Status 08/13/2016 FINAL  Final  Fungus Culture With Stain     Status: None (Preliminary result)   Collection Time: 08/08/16  3:31 PM  Result Value Ref Range Status   Fungus Stain Final report  Final    Comment: (NOTE) Performed At: [facility] [place], [place] [id number] Lindon Romp MD [id number]    Fungus (Mycology) Culture PENDING  Incomplete   Fungal Source PLEURAL  Final    Comment: PLEURAL PEEL  Aerobic/Anaerobic Culture (surgical/deep wound)  Status: None   Collection Time: 08/08/16  3:31 PM  Result Value Ref Range Status   Specimen Description WOUND  Final   Special Requests PLEURAL PEEL LEFT  Final   Gram Stain   Final    FEW WBC PRESENT,BOTH PMN AND MONONUCLEAR NO ORGANISMS SEEN Performed at [facility], [address] [place], [place] [address]    Culture   Final    FEW BACTEROIDES FRAGILIS BETA LACTAMASE POSITIVE RARE CLOSTRIDIUM CADAVERIS    Report Status 08/13/2016 FINAL  Final  Fungus Culture Result     Status: None   Collection Time: 08/08/16  3:31 PM  Result Value Ref Range Status   Result 1 Comment  Final    Comment: (NOTE) KOH/Calcofluor preparation:  no fungus observed. Performed At: [facility] [place], [place] [id number] Lindon Romp MD [id number]   Fungus Culture Result     Status: None   Collection Time: 08/08/16  3:31 PM  Result Value Ref Range Status   Result 1 Comment  Final    Comment: (NOTE) KOH/Calcofluor preparation:  no fungus observed. Performed At: [facility] [place], [place] [id number] Lindon Romp MD [id number]:[id number]   Fungus Culture Result     Status: None   Collection Time: 08/08/16  3:31 PM  Result Value Ref Range Status   Result 1 Comment  Final    Comment: (NOTE) KOH/Calcofluor preparation:  no fungus observed. Performed At: [facility] [place], [place] [id number] Lindon Romp MD [id number]   Fungus Culture Result     Status: None   Collection Time: 08/08/16  3:31 PM  Result Value Ref Range Status   Result 1 Comment  Final    Comment: (NOTE) KOH/Calcofluor preparation:  no fungus observed. Performed At: [facility] [place], [place] [id number] Lindon Romp MD [id number]:[id number]   Fungus Culture Result     Status: None   Collection Time: 08/08/16  3:31 PM  Result Value Ref Range Status   Result 1 Comment  Final    Comment: (NOTE) KOH/Calcofluor preparation:  no fungus observed. Performed At: [facility] [place], [place] [id number] Lindon Romp MD [id number]   Fungal organism reflex     Status: None   Collection Time: 08/08/16  3:31 PM  Result Value Ref Range Status   Fungal result 1 Candida albicans  Final    Comment: (NOTE) Light growth Performed At: [facility] [place], [place] [id number] Lindon Romp MD [id number]   Aerobic/Anaerobic Culture (surgical/deep wound)     Status: None   Collection Time: 08/12/16  8:34 AM  Result Value Ref Range Status   Specimen Description WOUND PERITONEAL SITE FOR WOUND VAC DRESSING  Final   Special Requests NONE  Final   Gram Stain   Final    FEW WBC PRESENT, PREDOMINANTLY PMN RARE GRAM VARIABLE COCCI Performed at [facility], [place] [address], [place], [place] [address]    Culture   Final    RARE ESCHERICHIA COLI MIXED ANAEROBIC FLORA PRESENT.  CALL LAB IF FURTHER IID REQUIRED.    Report Status 08/18/2016 FINAL  Final   Organism ID, Bacteria ESCHERICHIA COLI  Final      Susceptibility    Escherichia coli - MIC*    AMPICILLIN >=32 RESISTANT Resistant     CEFAZOLIN >=64 RESISTANT Resistant     CEFEPIME <=1 SENSITIVE Sensitive  CEFTAZIDIME <=1 SENSITIVE Sensitive     CEFTRIAXONE <=1 SENSITIVE Sensitive     CIPROFLOXACIN <=0.25 SENSITIVE Sensitive     GENTAMICIN <=1 SENSITIVE Sensitive     IMIPENEM <=0.25 SENSITIVE Sensitive     TRIMETH/SULFA <=20 SENSITIVE Sensitive     AMPICILLIN/SULBACTAM >=32 RESISTANT Resistant     PIP/TAZO <=4 SENSITIVE Sensitive     Extended ESBL NEGATIVE Sensitive     * RARE ESCHERICHIA COLI  Aerobic/Anaerobic Culture (surgical/deep wound)     Status: None   Collection Time: 08/17/16 12:30 PM  Result Value Ref Range Status   Specimen Description DRAINAGE  Final   Special Requests Normal  Final   Gram Stain   Final    FEW WBC PRESENT, PREDOMINANTLY PMN RARE GRAM POSITIVE COCCI IN PAIRS    Culture   Final    FEW BACTEROIDES FRAGILIS BETA LACTAMASE POSITIVE Performed at [facility], [place] [address], [place], [place] [address]    Report Status 08/22/2016 FINAL  Final  CULTURE, BLOOD (ROUTINE X 2) w Reflex to ID Panel     Status: None (Preliminary result)   Collection Time: 08/22/16  1:51 PM  Result Value Ref Range Status   Specimen Description BLOOD RIGHT HAND  Final   Special Requests BOTTLES DRAWN AEROBIC AND ANAEROBIC BCAV  Final   Culture NO GROWTH 2 DAYS  Final   Report Status PENDING  Incomplete  CULTURE, BLOOD (ROUTINE X 2) w Reflex to ID Panel     Status: None (Preliminary result)   Collection Time: 08/23/16  6:05 AM  Result Value Ref Range Status   Specimen Description BLOOD R HAND  Final   Special Requests IN BOTH AEROBIC AND ANAEROBIC BOTTLES BCAV  Final   Culture NO GROWTH 1 DAY  Final   Report Status PENDING  Incomplete  Aerobic/Anaerobic Culture (surgical/deep wound)     Status: None (Preliminary result)   Collection Time: 08/23/16  3:20 PM  Result Value Ref Range Status   Specimen Description DRAINAGE  Final    Special Requests Normal  Final   Gram Stain   Final    ABUNDANT WBC PRESENT,BOTH PMN AND MONONUCLEAR FEW GRAM POSITIVE COCCI IN PAIRS IN CLUSTERS RARE GRAM VARIABLE ROD Performed at [facility], [place] [address] [place], [place] [address]    Culture PENDING  Incomplete   Report Status PENDING  Incomplete    Studies/Results: Ct Image Guided Drainage By Percutaneous Catheter  Result Date: 08/23/2016 INDICATION: History of perforated abdominal viscus post 2 previous laparotomies, complicated by multiple abdominal and thoracic infections. Request made for CT-guided placement of a percutaneous drainage catheter into enlarging perisplenic fluid collection as well as into residual predominantly air collection within the right mid hemiabdomen. Additionally, request made for image guided exchange and up sizing of previously placed left-sided chest tube. EXAM: 1. CT-GUIDED PERISPLENIC ABSCESS DRAINAGE CATHETER PLACEMENT. 2. CT-GUIDED ABDOMINAL DRAINAGE CATHETER PLACEMENT. 3. CT-GUIDED EXCHANGE AND UP SIZING OF LEFT-SIDED CHEST TUBE COMPARISON:  CT of the chest, abdomen pelvis - 08/22/2016; CT-guided left-sided chest tube placement - 08/17/2016; CT abdomen pelvis -08/16/2016 MEDICATIONS: The patient is currently admitted to the hospital and receiving intravenous antibiotics. The antibiotics were administered within an appropriate time frame prior to the initiation of the procedure. ANESTHESIA/SEDATION: Moderate (conscious) sedation was employed during this procedure. A total of Versed 5 mg and Fentanyl 200 mcg was administered intravenously. Moderate Sedation Time: 110 minutes. The patient's level of consciousness and vital signs were monitored continuously by  radiology nursing throughout the procedure under my direct supervision. CONTRAST:  None COMPLICATIONS: None immediate. PROCEDURE: Informed written consent was obtained from the patient after a discussion of the risks, benefits and alternatives to  treatment. The patient was initially placed right lateral decubitus on the CT gantry and a pre procedural CT was performed re-demonstrating the known abscess/fluid collection within the perisplenic space with dominant component measuring approximately 6.0 x 3.2 cm (image 53, series 2), as well as unchanged positioning of left-sided chest tube with end coiled and locked within the posterior aspect the left pleural space (represent image 40, series 2). The procedures were planned. A timeout was performed prior to the initiation of the procedure. The skin surrounding the left posterior back as well as the external portion of the existing left-sided chest tube were prepped and draped in the usual sterile fashion. The overlying soft tissues were anesthetized with 1% lidocaine with epinephrine. Appropriate trajectory was planned with the use of a 22 gauge spinal needle directed towards the perisplenic abscess. At the same time, the external portion of the left-sided chest tube was cut and filling with an Amplatz wire. Appropriate positioning was confirmed. Next, the existing 12 French percutaneous drainage catheter was exchanged for a new slightly larger 14 French percutaneous drainage catheter with and coiled end locked within the left pleural space. An 18 gauge trocar needle was advanced into the perisplenic abscess/fluid collection and a short Amplatz super stiff wire was coiled within the collection. Appropriate positioning was confirmed with a limited CT scan. The tract was serially dilated allowing placement of a 10 [place] all-purpose drainage catheter. Appropriate positioning of both catheters was confirmed with a limited postprocedural CT scan. Approximately 25 cc of purulent fluid was aspirated from the perisplenic catheter which was then connected to a drainage bag and sutured in place. No significant output was obtained from the new left-sided chest tube despite appropriate positioning. The left-sided chest  tube was sutured in place and connected to a pleural vac. Next, the patient was positioned supine on the CT gantry with imaging demonstrating unchanged appearance of the mixed fluid though predominantly air-containing collection within the right mid hemiabdomen with dominant component measuring approximately 4.2 x 4.4 cm (image 26, series 8). The skin overlying the left mid hemiabdomen was cleaned and draped in usual sterile fashion. After the overlying soft tissues were anesthetized with 1% lidocaine with epinephrine, appropriate trajectory was planned with the use of a 22 gauge needle. An 18 gauge trocar needle was advanced into the midline air in fluid collection collection and a short Amplatz super stiff wire was coiled within the collection. Appropriate positioning was confirmed with a limited CT scan. The tract was serially dilated allowing placement of a 10 [place] all-purpose drainage catheter. Postprocedural imaging demonstrates appropriate positioning of the drainage catheter with significant reduction of the air component. No significant output was immediately aspirated from the drainage catheter. The midline abdominal drain was connected to a JP bulb and sutured in place. Dressings were placed. The patient tolerated the above procedures well without immediate postprocedural complication. IMPRESSION: 1. Successful CT guided placement of a 10 French all purpose drain catheter into the perisplenic abscess with aspiration of 15 mL of purulent fluid. Samples were sent to the laboratory as requested by the ordering clinical team. 2. Successful CT-guided exchange and up sizing of now 14 French left-sided chest tube. 3. Successful CT-guided placement of a new 10 French all-purpose drainage catheter into the persistent fluid and predominantly air containing  collection within the right mid hemiabdomen. Electronically Signed   By: Sandi Mariscal M.D.   On: 08/23/2016 18:01   Ct Image Guided Drainage By Percutaneous  Catheter  Result Date: 08/23/2016 INDICATION: History of perforated abdominal viscus post 2 previous laparotomies, complicated by multiple abdominal and thoracic infections. Request made for CT-guided placement of a percutaneous drainage catheter into enlarging perisplenic fluid collection as well as into residual predominantly air collection within the right mid hemiabdomen. Additionally, request made for image guided exchange and up sizing of previously placed left-sided chest tube. EXAM: 1. CT-GUIDED PERISPLENIC ABSCESS DRAINAGE CATHETER PLACEMENT. 2. CT-GUIDED ABDOMINAL DRAINAGE CATHETER PLACEMENT. 3. CT-GUIDED EXCHANGE AND UP SIZING OF LEFT-SIDED CHEST TUBE COMPARISON:  CT of the chest, abdomen pelvis - 08/22/2016; CT-guided left-sided chest tube placement - 08/17/2016; CT abdomen pelvis -08/16/2016 MEDICATIONS: The patient is currently admitted to the hospital and receiving intravenous antibiotics. The antibiotics were administered within an appropriate time frame prior to the initiation of the procedure. ANESTHESIA/SEDATION: Moderate (conscious) sedation was employed during this procedure. A total of Versed 5 mg and Fentanyl 200 mcg was administered intravenously. Moderate Sedation Time: 110 minutes. The patient's level of consciousness and vital signs were monitored continuously by radiology nursing throughout the procedure under my direct supervision. CONTRAST:  None COMPLICATIONS: None immediate. PROCEDURE: Informed written consent was obtained from the patient after a discussion of the risks, benefits and alternatives to treatment. The patient was initially placed right lateral decubitus on the CT gantry and a pre procedural CT was performed re-demonstrating the known abscess/fluid collection within the perisplenic space with dominant component measuring approximately 6.0 x 3.2 cm (image 53, series 2), as well as unchanged positioning of left-sided chest tube with end coiled and locked within the  posterior aspect the left pleural space (represent image 40, series 2). The procedures were planned. A timeout was performed prior to the initiation of the procedure. The skin surrounding the left posterior back as well as the external portion of the existing left-sided chest tube were prepped and draped in the usual sterile fashion. The overlying soft tissues were anesthetized with 1% lidocaine with epinephrine. Appropriate trajectory was planned with the use of a 22 gauge spinal needle directed towards the perisplenic abscess. At the same time, the external portion of the left-sided chest tube was cut and filling with an Amplatz wire. Appropriate positioning was confirmed. Next, the existing 12 French percutaneous drainage catheter was exchanged for a new slightly larger 14 French percutaneous drainage catheter with and coiled end locked within the left pleural space. An 18 gauge trocar needle was advanced into the perisplenic abscess/fluid collection and a short Amplatz super stiff wire was coiled within the collection. Appropriate positioning was confirmed with a limited CT scan. The tract was serially dilated allowing placement of a 10 [place] all-purpose drainage catheter. Appropriate positioning of both catheters was confirmed with a limited postprocedural CT scan. Approximately 25 cc of purulent fluid was aspirated from the perisplenic catheter which was then connected to a drainage bag and sutured in place. No significant output was obtained from the new left-sided chest tube despite appropriate positioning. The left-sided chest tube was sutured in place and connected to a pleural vac. Next, the patient was positioned supine on the CT gantry with imaging demonstrating unchanged appearance of the mixed fluid though predominantly air-containing collection within the right mid hemiabdomen with dominant component measuring approximately 4.2 x 4.4 cm (image 26, series 8). The skin overlying the left mid  hemiabdomen was  cleaned and draped in usual sterile fashion. After the overlying soft tissues were anesthetized with 1% lidocaine with epinephrine, appropriate trajectory was planned with the use of a 22 gauge needle. An 18 gauge trocar needle was advanced into the midline air in fluid collection collection and a short Amplatz super stiff wire was coiled within the collection. Appropriate positioning was confirmed with a limited CT scan. The tract was serially dilated allowing placement of a 10 [place] all-purpose drainage catheter. Postprocedural imaging demonstrates appropriate positioning of the drainage catheter with significant reduction of the air component. No significant output was immediately aspirated from the drainage catheter. The midline abdominal drain was connected to a JP bulb and sutured in place. Dressings were placed. The patient tolerated the above procedures well without immediate postprocedural complication. IMPRESSION: 1. Successful CT guided placement of a 10 French all purpose drain catheter into the perisplenic abscess with aspiration of 15 mL of purulent fluid. Samples were sent to the laboratory as requested by the ordering clinical team. 2. Successful CT-guided exchange and up sizing of now 14 French left-sided chest tube. 3. Successful CT-guided placement of a new 10 French all-purpose drainage catheter into the persistent fluid and predominantly air containing collection within the right mid hemiabdomen. Electronically Signed   By: Sandi Mariscal M.D.   On: 08/23/2016 18:01    Assessment/Plan: NAZIFA TRINKA is a 52 y.o. female with perforated peptic ulcer, with complicated course since admission with placement of an IR drain and then repeat surgery 2/26. She has been on zosyn since admission and started fluconazole 2/26 - changed to eraxis 3/14.  Multiple drains in place.  Had L empyema noted and s/p decortication with about 500 cc pus aspirated with cx growing bacteroides, and  clostridium and candida. Wound cx with E coli, actinomyces. - E coli- sensitive to zosyn.  WBC down 39-> 16.7 after surgery but increased and found to have loculated fluid on L chest  3/16 had repeat drain on L - cx with bacteroides Her R abd JP drain has some purulent drainage 3/19 - wbc decreasing. Remains weak 3/21 wbc up to 30. CT reviewed. Tatamy done.  3/23- 2 new drains placed by IR 3/22 with purulent drainage - GS with abundant wbc, GPC in pairs and clusters and gram variable rods. Draining some thin bilious material. Also upsize of L chest drain - some purulence in that drain now. Also had to have picc removed as not functioning WBC down to 16  Recommendations Awaiting cx from drains placed 3/22 Will need several more weeks of IV zosyn (ertapenem does not cover pseudomonas nor enterococcus two common pathogens in cases such as this) Cont  eraxis given risk of resistance but once ready for dc can change back to fluconazole 200 qd for another 2 weeks Continue management of chest tubes per Dr Genevive Bi and abd drains by surgery Thank you very much for the consult. Will follow with you.  Shalea Tomczak P   08/24/2016, 3:29 PM

## 2016-08-24 NOTE — Progress Notes (Signed)
PT Cancellation Note  Patient Details Name: Lindsey Wolf MRN: [id number] DOB: 1966/05/07   Cancelled Treatment:    Reason Eval/Treat Not Completed: Patient not medically ready. Treatment attempted, however pt currently resting and complains of pain. Pt is s/p empyema and post op abscess to drain fluid on 08/23/16. Pt reports she "just can't do it today." Will re-attempt on Monday.   Bohdan Macho 08/24/2016, 3:04 PM  Greggory Stallion, PT, DPT [phone]

## 2016-08-24 NOTE — Care Management (Signed)
CT guided abdominal chest tube exchange completed per Dr. Pascal Lux 08/23/16. Low grade temperature = 99.4 RA. Zosyn IV continues. WBC's 23.4. Receiving Zofran for nausea. TPN continues. Wound care continues. Plan to go to [facility] when stable. Shelbie Ammons RN MSN CCM Care Management

## 2016-08-25 LAB — CBC
HEMATOCRIT: 30.2 % — AB (ref 35.0–47.0)
HEMOGLOBIN: 9.7 g/dL — AB (ref 12.0–16.0)
MCH: 25.9 pg — ABNORMAL LOW (ref 26.0–34.0)
MCHC: 32 g/dL (ref 32.0–36.0)
MCV: 80.8 fL (ref 80.0–100.0)
Platelets: 697 10*3/uL — ABNORMAL HIGH (ref 150–440)
RBC: 3.74 MIL/uL — ABNORMAL LOW (ref 3.80–5.20)
RDW: 22.9 % — AB (ref 11.5–14.5)
WBC: 19.9 10*3/uL — ABNORMAL HIGH (ref 3.6–11.0)

## 2016-08-25 LAB — BASIC METABOLIC PANEL
ANION GAP: 7 (ref 5–15)
BUN: 6 mg/dL (ref 6–20)
CALCIUM: 8 mg/dL — AB (ref 8.9–10.3)
CO2: 29 mmol/L (ref 22–32)
Chloride: 101 mmol/L (ref 101–111)
Creatinine, Ser: <0.3 mg/dL — ABNORMAL LOW (ref 0.44–1.00)
Glucose, Bld: 110 mg/dL — ABNORMAL HIGH (ref 65–99)
Potassium: 3.7 mmol/L (ref 3.5–5.1)
Sodium: 137 mmol/L (ref 135–145)

## 2016-08-25 LAB — GLUCOSE, CAPILLARY: GLUCOSE-CAPILLARY: 135 mg/dL — AB (ref 65–99)

## 2016-08-25 MED ORDER — TRACE MINERALS CR-CU-MN-SE-ZN 10-1000-500-60 MCG/ML IV SOLN
INTRAVENOUS | Status: AC
  Administered 2016-08-25: 17:00:00 via INTRAVENOUS
  Filled 2016-08-25: qty 960

## 2016-08-25 MED ORDER — OCTREOTIDE ACETATE 100 MCG/ML IJ SOLN
100.0000 ug | Freq: Three times a day (TID) | INTRAMUSCULAR | Status: DC
  Administered 2016-08-25 – 2016-08-29 (×14): 100 ug via SUBCUTANEOUS
  Filled 2016-08-25 (×14): qty 1

## 2016-08-25 MED ORDER — INSULIN ASPART 100 UNIT/ML ~~LOC~~ SOLN
0.0000 [IU] | SUBCUTANEOUS | Status: DC
  Administered 2016-08-25 – 2016-08-29 (×15): 1 [IU] via SUBCUTANEOUS
  Filled 2016-08-25 (×16): qty 1

## 2016-08-25 NOTE — Progress Notes (Signed)
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  Assessment:   GI:  Endo:  Insulin requirements in the past 24 hours:  Lytes: Renal: Pulm: Cards:  Hepatobil: Neuro: ID:  Best Practices: TPN Access: TPN start date: 3/24  Nutritional Goals (per RD recommendation on____): kCal: Protein:   Current Nutrition:   Plan:  3/24 Clinimix E 5/15 at 57ml/hr  Initiate q4hr SSI and adjust as needed Monitor TPN labs F/U in am   Olivia Canter, [facility] Clinical Pharmacist 08/25/2016,2:38 PM

## 2016-08-25 NOTE — Progress Notes (Signed)
Brief Nutrition Note  RD received page on pharmacist on on-call pager; informed of plan to start TPN. Pharmacist reports that she will put in a formal RD consult. Central Access: Order to insert PICC on 08/25/16  Pharmacy to initiate Clinimix 5%AA/15%Dextrose at 50ml/hr with MVI and Trace Minerals at this time.  Full assessment and further recommendations to follow.  Admitting Dx: Perforated ulcer ([place]) [K27.5] Right adnexal tenderness [R10.2]  Body mass index is 17.84 kg/m. Pt meets criteria for underweight based on current BMI.  Labs:   Recent Labs Lab 08/23/16 0605 08/24/16 1144 08/25/16 0336  NA 136 141 137  K 2.8* 3.8 3.7  CL 100* 103 101  CO2 30 30 29   BUN 6 5* 6  CREATININE 0.33* 0.39* <0.30*  CALCIUM 7.6* 8.1* 8.0*  MG  --  1.7  --     Mckinsey Keagle A. Jimmye Norman, RD, LDN, CDE Pager: [phone] After hours Pager: [phone]

## 2016-08-25 NOTE — Progress Notes (Signed)
s/p perforated ulcer and laparotomy x 2 Empyema s/p thoracotomy and decortication s/p placement of two new abd drain, one on anterior abdominal wall draining an interloop collection and another on torso draining perisplenic abscess s/p exchange of chest pigtail    EVENTS:  New drain to the left of abdominal wall placed is definitely draining bile. Making it now a controlled fistula w 250cc output /24hrs She is doing well and was taking PO VSS Pending latest cultures  PE NAD Chest: tubes in place w some thinning of output Abd: soft, nt no peritonitis. New JP w bilious material Torso drain with purulent fluid.  Right sided JP significant improvement.  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

## 2016-08-25 NOTE — Progress Notes (Signed)
Wound vac to abdomen was changed today.

## 2016-08-25 NOTE — Plan of Care (Signed)
Problem: Fluid Volume: Goal: Ability to maintain a balanced intake and output will improve Outcome: Not Progressing Re-started on TPN

## 2016-08-26 LAB — BASIC METABOLIC PANEL
ANION GAP: 3 — AB (ref 5–15)
BUN: 5 mg/dL — AB (ref 6–20)
CHLORIDE: 103 mmol/L (ref 101–111)
CO2: 31 mmol/L (ref 22–32)
Calcium: 7.9 mg/dL — ABNORMAL LOW (ref 8.9–10.3)
Creatinine, Ser: 0.34 mg/dL — ABNORMAL LOW (ref 0.44–1.00)
GFR calc Af Amer: >60 mL/min (ref >60)
Glucose, Bld: 128 mg/dL — ABNORMAL HIGH (ref 65–99)
POTASSIUM: 4.3 mmol/L (ref 3.5–5.1)
SODIUM: 137 mmol/L (ref 135–145)

## 2016-08-26 LAB — GLUCOSE, CAPILLARY
GLUCOSE-CAPILLARY: 100 mg/dL — AB (ref 65–99)
GLUCOSE-CAPILLARY: 124 mg/dL — AB (ref 65–99)
GLUCOSE-CAPILLARY: 139 mg/dL — AB (ref 65–99)
GLUCOSE-CAPILLARY: 143 mg/dL — AB (ref 65–99)
Glucose-Capillary: 122 mg/dL — ABNORMAL HIGH (ref 65–99)
Glucose-Capillary: 134 mg/dL — ABNORMAL HIGH (ref 65–99)
Glucose-Capillary: 139 mg/dL — ABNORMAL HIGH (ref 65–99)

## 2016-08-26 LAB — CBC
HEMATOCRIT: 30.6 % — AB (ref 35.0–47.0)
HEMOGLOBIN: 9.5 g/dL — AB (ref 12.0–16.0)
MCH: 25.5 pg — ABNORMAL LOW (ref 26.0–34.0)
MCHC: 31.1 g/dL — ABNORMAL LOW (ref 32.0–36.0)
MCV: 81.8 fL (ref 80.0–100.0)
Platelets: 653 10*3/uL — ABNORMAL HIGH (ref 150–440)
RBC: 3.74 MIL/uL — ABNORMAL LOW (ref 3.80–5.20)
RDW: 22.6 % — AB (ref 11.5–14.5)
WBC: 13.1 10*3/uL — AB (ref 3.6–11.0)

## 2016-08-26 LAB — MAGNESIUM: MAGNESIUM: 1.7 mg/dL (ref 1.7–2.4)

## 2016-08-26 LAB — HEPATIC FUNCTION PANEL
ALBUMIN: 1.7 g/dL — AB (ref 3.5–5.0)
ALK PHOS: 129 U/L — AB (ref 38–126)
ALT: 11 U/L — ABNORMAL LOW (ref 14–54)
AST: 19 U/L (ref 15–41)
BILIRUBIN TOTAL: 0.3 mg/dL (ref 0.3–1.2)
Bilirubin, Direct: <0.1 mg/dL — ABNORMAL LOW (ref 0.1–0.5)
TOTAL PROTEIN: 5.3 g/dL — AB (ref 6.5–8.1)

## 2016-08-26 LAB — TRIGLYCERIDES: TRIGLYCERIDES: 138 mg/dL (ref <150)

## 2016-08-26 LAB — PHOSPHORUS: Phosphorus: 4.1 mg/dL (ref 2.5–4.6)

## 2016-08-26 MED ORDER — FAT EMULSION 20 % IV EMUL
250.0000 mL | INTRAVENOUS | Status: AC
  Administered 2016-08-26: 250 mL via INTRAVENOUS
  Filled 2016-08-26: qty 250

## 2016-08-26 MED ORDER — TRACE MINERALS CR-CU-MN-SE-ZN 10-1000-500-60 MCG/ML IV SOLN
INTRAVENOUS | Status: AC
  Administered 2016-08-26: 18:00:00 via INTRAVENOUS
  Filled 2016-08-26: qty 1992

## 2016-08-26 NOTE — Progress Notes (Signed)
Dr. Dahlia Byes and Dr. Adonis Huguenin was made aware of the drainage that was collecting at the bottom of the abdominal incision with wound vac changed yesterday.

## 2016-08-26 NOTE — Progress Notes (Signed)
Nutrition Follow-up  DOCUMENTATION CODES:   Severe malnutrition in context of acute illness/injury  INTERVENTION:  Recommend advancing to goal TPN regimen of Clinimix E 5/15 @ 83 ml/hr + 20% ILE @ 20 ml/hr over 12 hours. Goal regimen provides 1894 kcal, 99.6 grams of protein, 2472 ml fluid daily.  Monitor magnesium, potassium, and phosphorus daily for at least 3 days, MD to replete as needed, as pt is at risk for refeeding syndrome given severe malnutrition, inadequate oral intake for 14 days.  NUTRITION DIAGNOSIS:   Inadequate oral intake related to poor appetite, acute illness as evidenced by per patient/family report, meal completion < 50%.  Ongoing - patient now NPO.  GOAL:   Patient will meet greater than or equal to 90% of their needs  Not met - will address with advancement to goal TPN rate.  MONITOR:   I & O's, Diet advancement, Labs, Weight trends, Supplement acceptance  REASON FOR ASSESSMENT:   Malnutrition Screening Tool    ASSESSMENT:   Lindsey Wolf is a 51 y.o. female with a 3 days hx of abdominal pain in the epigastric area and she thinks it was a gas type. Pain is moderate to severe in intensity and has worsening today.  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  Plan is for patient to be NPO for bowel rest and to re-start TPN. She was started on octreotide.  Access: triple lumen PICC placed 08/25/2016  TPN: patient initiated on Clinimix E 5/15 @ 40 ml/hr on 3/24  Medications reviewed and include: Novolog sliding scale Q4hrs (received 3 units since initiating TPN last night), Medline mouth rinse, octreotide 100 micrograms Q8hrs, pantoprazole, Zosyn, Miralax, potassium chloride 40 mEq BID.  Labs reviewed: CBG 100-139 since initiation of TPN last night, BUN 5, Creatinine 0.34, anion gap 3. Potassium, Phosphorus, Magnesium WNL. Triglycerides 138.   Discussed with RN.   Diet  Order:  Diet NPO time specified Except for: Ice Chips, Sips with Meds .TPN (CLINIMIX-E) Adult  Skin:  Wound (see comment) (stage II pressure ulcer in nare)  Last BM:  08/12/2016  Height:   Ht Readings from Last 1 Encounters:  08/08/16 '5\' 10"'  (1.778 m)    Weight:   Wt Readings from Last 1 Encounters:  08/25/16 124 lb 4.8 oz (56.4 kg)    Ideal Body Weight:  68.18 kg  BMI:  Body mass index is 17.84 kg/m.  Estimated Nutritional Needs:   Kcal:  1700-1900 (MSJ x 1.3-1.5)  Protein:  85-115 grams (1.5-2 grams/kg)  Fluid:  1.7-1.9 L/day (30-35 ml/kg)  EDUCATION NEEDS:   Education needs addressed (High-Calorie, High-Protein Nutrition Therapy)  Willey Blade, MS, RD, LDN Pager: [phone] After Hours Pager: [phone]

## 2016-08-26 NOTE — Progress Notes (Signed)
s/p perforated ulcer and laparotomy x 2 Empyema s/p thoracotomy and decortication s/p placement of two new abd drains, one on anterior abdominal wall draining an interloop collection ( controlled fistula nor) and another on torso draining perisplenic abscess s/p exchange of chest pigtail   EVENTS:   Fistula output w significant improvement after was placed NPO WBC trending down AVSS Gram Neg rods from cultures so far   PE NAD Chest: tubes in place w some cloudy  output Abd: soft, nt no peritonitis. JP w bilious material Torso drain with purulent fluid. Right JP removed given its serous nature and minimal output. 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 She is frustrated for her prolonged hospitalization and we had a discussion about potential transfer to a tertiary center. She states that she has made significant improvement in the last 24 hours and wishes to wait 24-48 hours before making a final determination of transferring to [facility]. If in the next 24-48 hours she does not improve we will initiate arrangements to transfer the patient

## 2016-08-26 NOTE — Progress Notes (Signed)
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  Plan is for patient to be NPO for bowel rest and to re-start TPN. She was started on octreotide.  Patient Measurements: Height: 5\' 10"  (177.8 cm) Weight: 124 lb 4.8 oz (56.4 kg) IBW/kg (Calculated) : 68.5 TPN AdjBW (KG): 59.4 Body mass index is 17.84 kg/m.  Assessment:   GI:  Endo:  Insulin requirements in the past 24 hours: 3 units Lytes: WNL Renal: Pulm: Cards:  Hepatobil: Neuro: ID:  Best Practices: TPN Access: Triple Lumen PICC placed 3/24 TPN start date: 3/24  Nutritional Goals (per RD recommendation on 3/25): kCal: 1700-1900 (MSJ x 1.3-1.5) Protein: 85-115 grams (1.5-2 grams/kg)  Current Nutrition: NPO  Plan:  Clinimix E 5/15 @ 83 ml/hr + 20% ILE @ 20 ml/hr over 12 hours. Goal regimen provides 1894 kcal, 99.6 grams of protein, 2472 ml fluid daily.  Monitor magnesium, potassium, and phosphorus daily for at least 3 days, MD to replete as needed, as pt is at risk for refeeding syndrome given severe malnutrition, inadequate oral intake for 14 days.  Continue q4hr SSI and adjust as needed Monitor TPN labs F/U in am   Olivia Canter, [facility] Clinical Pharmacist 08/26/2016,11:04 AM

## 2016-08-27 LAB — DIFFERENTIAL
Basophils Absolute: 0.1 10*3/uL (ref 0–0.1)
Basophils Relative: 1 %
EOS PCT: 3 %
Eosinophils Absolute: 0.4 10*3/uL (ref 0–0.7)
LYMPHS ABS: 1.2 10*3/uL (ref 1.0–3.6)
LYMPHS PCT: 9 %
MONO ABS: 1.2 10*3/uL — AB (ref 0.2–0.9)
Monocytes Relative: 9 %
NEUTROS ABS: 11 10*3/uL — AB (ref 1.4–6.5)
Neutrophils Relative %: 78 %

## 2016-08-27 LAB — COMPREHENSIVE METABOLIC PANEL
ALT: 10 U/L — ABNORMAL LOW (ref 14–54)
AST: 19 U/L (ref 15–41)
Albumin: 1.7 g/dL — ABNORMAL LOW (ref 3.5–5.0)
Alkaline Phosphatase: 122 U/L (ref 38–126)
Anion gap: 5 (ref 5–15)
BILIRUBIN TOTAL: 0.2 mg/dL — AB (ref 0.3–1.2)
BUN: 10 mg/dL (ref 6–20)
CHLORIDE: 101 mmol/L (ref 101–111)
CO2: 30 mmol/L (ref 22–32)
Calcium: 7.9 mg/dL — ABNORMAL LOW (ref 8.9–10.3)
Creatinine, Ser: <0.3 mg/dL — ABNORMAL LOW (ref 0.44–1.00)
GLUCOSE: 117 mg/dL — AB (ref 65–99)
POTASSIUM: 4.7 mmol/L (ref 3.5–5.1)
SODIUM: 136 mmol/L (ref 135–145)
Total Protein: 5.4 g/dL — ABNORMAL LOW (ref 6.5–8.1)

## 2016-08-27 LAB — CULTURE, BLOOD (ROUTINE X 2): CULTURE: NO GROWTH

## 2016-08-27 LAB — GLUCOSE, CAPILLARY
GLUCOSE-CAPILLARY: 107 mg/dL — AB (ref 65–99)
GLUCOSE-CAPILLARY: 121 mg/dL — AB (ref 65–99)
Glucose-Capillary: 139 mg/dL — ABNORMAL HIGH (ref 65–99)
Glucose-Capillary: 139 mg/dL — ABNORMAL HIGH (ref 65–99)
Glucose-Capillary: 136 mg/dL — ABNORMAL HIGH (ref 65–99)
Glucose-Capillary: 133 mg/dL — ABNORMAL HIGH (ref 65–99)

## 2016-08-27 LAB — PHOSPHORUS: Phosphorus: 4.1 mg/dL (ref 2.5–4.6)

## 2016-08-27 LAB — CBC
HEMATOCRIT: 31.5 % — AB (ref 35.0–47.0)
HEMOGLOBIN: 9.8 g/dL — AB (ref 12.0–16.0)
MCH: 25.5 pg — AB (ref 26.0–34.0)
MCHC: 31.2 g/dL — ABNORMAL LOW (ref 32.0–36.0)
MCV: 81.7 fL (ref 80.0–100.0)
Platelets: 684 10*3/uL — ABNORMAL HIGH (ref 150–440)
RBC: 3.85 MIL/uL (ref 3.80–5.20)
RDW: 21.8 % — ABNORMAL HIGH (ref 11.5–14.5)
WBC: 13.9 10*3/uL — ABNORMAL HIGH (ref 3.6–11.0)

## 2016-08-27 LAB — MAGNESIUM: Magnesium: 1.8 mg/dL (ref 1.7–2.4)

## 2016-08-27 LAB — PREALBUMIN: PREALBUMIN: 6.5 mg/dL — AB (ref 18–38)

## 2016-08-27 LAB — TRIGLYCERIDES: Triglycerides: 126 mg/dL (ref <150)

## 2016-08-27 MED ORDER — TRACE MINERALS CR-CU-MN-SE-ZN 10-1000-500-60 MCG/ML IV SOLN
INTRAVENOUS | Status: AC
  Administered 2016-08-27: 18:00:00 via INTRAVENOUS
  Filled 2016-08-27: qty 1992

## 2016-08-27 MED ORDER — FAT EMULSION 20 % IV EMUL
250.0000 mL | INTRAVENOUS | Status: AC
  Administered 2016-08-27: 250 mL via INTRAVENOUS
  Filled 2016-08-27: qty 250

## 2016-08-27 NOTE — Progress Notes (Signed)
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  Plan is for patient to be NPO for bowel rest and to re-start TPN. She was started on octreotide.  Patient Measurements: Height: 5\' 10"  (177.8 cm) Weight: 119 lb 14.4 oz (54.4 kg) IBW/kg (Calculated) : 68.5 TPN AdjBW (KG): 59.4 Body mass index is 17.2 kg/m.  Assessment:   GI:  Endo:  Insulin requirements in the past 24 hours: 3 units Lytes: WNL Renal: Pulm: Cards:  Hepatobil: Neuro: ID:  Best Practices: TPN Access: Triple Lumen PICC placed 3/24 TPN start date: 3/24  Nutritional Goals (per RD recommendation on 3/25): kCal: 1700-1900 (MSJ x 1.3-1.5) Protein: 85-115 grams (1.5-2 grams/kg)  Current Nutrition: NPO  Plan:   Continue Clinimix E 5/15 @ 83 ml/hr + 20% ILE @ 20 ml/hr over 12 hours. Goal regimen provides 1894 kcal, 99.6 grams of protein, 2472 ml fluid daily.  Continue SSI Q4hr.   Pharmacy will continue to monitor and adjust per consult.   Simpson,Michael L 08/27/2016,12:57 PM

## 2016-08-27 NOTE — Progress Notes (Signed)
OT Cancellation Note  Patient Details Name: Lindsey Wolf MRN: [id number] DOB: 27-Feb-1966   Cancelled Treatment:    Reason Eval/Treat Not Completed: Other (comment). Pt sleeping soundly upon OT's arrival. Will re-attempt OT treatment session at later date/time as appropriate.   Jeni Salles, MPH, MS, OTR/L ascom [phone] 08/27/16, 1:53 PM

## 2016-08-27 NOTE — Progress Notes (Signed)
Notified Dr. Genevive Bi that patient complained of chest pain. VSS. Checked chest tubes to make sure that everything looked intact. On recheck chest pain had left within 5 to 10 minutes. Per MD place order for PA and lateral chest x-ray in the AM. Will continue to monitor.

## 2016-08-27 NOTE — Progress Notes (Signed)
PT Cancellation Note  Patient Details Name: Lindsey Wolf MRN: [id number] DOB: [birth year]   Cancelled Treatment:    Reason Eval/Treat Not Completed: Other (comment). Entered room with RN in room. Pt adamantly refuses physical therapy this date reporting she has had a busy morning. She is agreeable to ambulating with RN later today and working with PT next date. Heavily educated on benefits of mobility, however pt continues to refuse. Will re-attempt next date.   Cloria Ciresi 08/27/2016, 9:20 AM Greggory Stallion, PT, DPT [phone]

## 2016-08-27 NOTE — Progress Notes (Signed)
She seems somewhat disappointed today. She is not particularly hungry and has little motivation this morning. She is disappointed with her care and that she has failed to completely respond to the therapy that has been provided. However she understands that all has been done to her benefit.  She complains of pain at several locations. Most these appear to be where the drain entrance sites are. She denied any fevers or chills.  Today I advanced her empyema tubes for the first time. I also secured her pigtail catheter into the Heimlich valve and Foley bag. Her thoracotomy wound is clean and dry.  I did change her wound VAC. At the most inferior aspect of the incision just to the right underneath the skin there was an undrained pocket of purulence. I fashioned a 1 week of sponge in place this under the skin. I replaced her wound VAC. The area around the umbilicus is now closed.  She continues to drain bilious fluid from one of the abdominal drains.  She told me that she is interested in pursuing a second opinion at [facility] or [place]. She will discuss this with the general surgeons.  Tim [employer]

## 2016-08-27 NOTE — Plan of Care (Signed)
Problem: Activity: Goal: Ability to tolerate increased activity will improve Outcome: Not Progressing Refused to use BSC to void; strongly encouraged patient to get OOB and renew her strength;

## 2016-08-27 NOTE — Progress Notes (Signed)
15 Days Post-Op  Subjective: Patient feels well today she is tolerating ice chips without difficulty.  Objective: Vital signs in last 24 hours: Temp:  [97.6 F (36.4 C)-98.4 F (36.9 C)] 97.6 F (36.4 C) (03/26 0559) Pulse Rate:  [78-79] 78 (03/26 0559) Resp:  [16-18] 18 (03/26 0559) BP: (105-119)/(63-79) 119/79 (03/26 0559) SpO2:  [96 %-99 %] 99 % (03/26 0559) Weight:  [119 lb 14.4 oz (54.4 kg)] 119 lb 14.4 oz (54.4 kg) (03/25 1618) Last BM Date: 08/26/16  Intake/Output from previous day: 03/25 0701 - 03/26 0700 In: 1456 [I.V.:1273; IV Piggyback:183] Out: 2437 [Urine:2100; Drains:245; Chest Tube:92] Intake/Output this shift: Total I/O In: 123 [I.V.:107; IV Piggyback:16] Out: 30 [Drains:30]  Physical exam:  Multiple drains in place chest tubes in place wound VAC in place. Purulence and one drain and bilious output in another. Nontender calves  Lab Results: CBC   Recent Labs  08/26/16 0635 08/27/16 0405  WBC 13.1* 13.9*  HGB 9.5* 9.8*  HCT 30.6* 31.5*  PLT 653* 684*   BMET  Recent Labs  08/26/16 0635 08/27/16 0405  NA 137 136  K 4.3 4.7  CL 103 101  CO2 31 30  GLUCOSE 128* 117*  BUN 5* 10  CREATININE 0.34* <0.30*  CALCIUM 7.9* 7.9*   PT/INR No results for input(s): LABPROT, INR in the last 72 hours. ABG No results for input(s): PHART, HCO3 in the last 72 hours.  Invalid input(s): PCO2, PO2  Studies/Results: No results found.  Anti-infectives: Anti-infectives    Start     Dose/Rate Route Frequency Ordered Stop   08/16/16 1500  anidulafungin (ERAXIS) 100 mg in sodium chloride 0.9 % 100 mL IVPB     100 mg over 90 Minutes Intravenous Every 24 hours 08/15/16 1428     08/15/16 1500  anidulafungin (ERAXIS) 200 mg in sodium chloride 0.9 % 200 mL IVPB     200 mg over 180 Minutes Intravenous  Once 08/15/16 1428 08/15/16 1822   08/15/16 1430  piperacillin-tazobactam (ZOSYN) IVPB 3.375 g     3.375 g 12.5 mL/hr over 240 Minutes Intravenous Every 8 hours  08/15/16 1428     08/15/16 0730  fluconazole (DIFLUCAN) IVPB 400 mg  Status:  Discontinued     400 mg 100 mL/hr over 120 Minutes Intravenous Every 24 hours 08/15/16 0712 08/15/16 1402   08/11/16 0945  ertapenem (INVANZ) 1 g in sodium chloride 0.9 % 50 mL IVPB  Status:  Discontinued     1 g 100 mL/hr over 30 Minutes Intravenous Every 24 hours 08/11/16 0939 08/15/16 1415   08/05/16 1500  vancomycin (VANCOCIN) IVPB 1000 mg/200 mL premix  Status:  Discontinued     1,000 mg 200 mL/hr over 60 Minutes Intravenous Every 12 hours 08/05/16 0753 08/06/16 0850   08/05/16 0800  vancomycin (VANCOCIN) IVPB 1000 mg/200 mL premix     1,000 mg 200 mL/hr over 60 Minutes Intravenous  Once 08/05/16 0753 08/05/16 0923   07/30/16 1815  fluconazole (DIFLUCAN) IVPB 400 mg  Status:  Discontinued     400 mg 100 mL/hr over 120 Minutes Intravenous Every 24 hours 07/30/16 1743 08/13/16 1358   07/30/16 1528  piperacillin-tazobactam (ZOSYN) 3.375 GM/50ML IVPB    Comments:  LEWIS, CINDY: cabinet override      07/30/16 1528 07/30/16 1525   07/21/16 0000  piperacillin-tazobactam (ZOSYN) IVPB 3.375 g  Status:  Discontinued     3.375 g 12.5 mL/hr over 240 Minutes Intravenous Every 8 hours 07/20/16 1809 08/11/16 0939  07/20/16 1430  piperacillin-tazobactam (ZOSYN) IVPB 3.375 g     3.375 g 100 mL/hr over 30 Minutes Intravenous  Once 07/20/16 1424 07/20/16 1603      Assessment/Plan: s/p Procedure(s): ,remove 2  JP drains,add one pennrose   Studies reviewed. Chest with Dr. Faith Rogue. I discussed with the patient her transfer desires. It is apparent drip the driven by a friend or neighbor's suggested she have a second opinion. After discussion with the patient's cc seems happy with our care and will forego transfer request at this time and wishes to continue on TPN I offered starting clear liquids as her output is minimal and clear liquid should not change the output much. This will be observed.  Florene Glen, MD,  FACS  08/27/2016

## 2016-08-28 ENCOUNTER — Inpatient Hospital Stay: Payer: BLUE CROSS/BLUE SHIELD

## 2016-08-28 LAB — CBC WITH DIFFERENTIAL/PLATELET
BASOS ABS: 0.1 10*3/uL (ref 0–0.1)
BASOS PCT: 1 %
EOS ABS: 0.4 10*3/uL (ref 0–0.7)
EOS PCT: 3 %
HCT: 32.2 % — ABNORMAL LOW (ref 35.0–47.0)
HEMOGLOBIN: 10.3 g/dL — AB (ref 12.0–16.0)
Lymphocytes Relative: 11 %
Lymphs Abs: 1.5 10*3/uL (ref 1.0–3.6)
MCH: 25.6 pg — ABNORMAL LOW (ref 26.0–34.0)
MCHC: 31.9 g/dL — ABNORMAL LOW (ref 32.0–36.0)
MCV: 80.1 fL (ref 80.0–100.0)
Monocytes Absolute: 1.2 10*3/uL — ABNORMAL HIGH (ref 0.2–0.9)
Monocytes Relative: 9 %
NEUTROS PCT: 76 %
Neutro Abs: 11 10*3/uL — ABNORMAL HIGH (ref 1.4–6.5)
PLATELETS: 701 10*3/uL — AB (ref 150–440)
RBC: 4.02 MIL/uL (ref 3.80–5.20)
RDW: 22.6 % — ABNORMAL HIGH (ref 11.5–14.5)
WBC: 14.3 10*3/uL — AB (ref 3.6–11.0)

## 2016-08-28 LAB — AEROBIC/ANAEROBIC CULTURE (SURGICAL/DEEP WOUND): SPECIAL REQUESTS: NORMAL

## 2016-08-28 LAB — GLUCOSE, CAPILLARY
GLUCOSE-CAPILLARY: 115 mg/dL — AB (ref 65–99)
GLUCOSE-CAPILLARY: 125 mg/dL — AB (ref 65–99)
GLUCOSE-CAPILLARY: 105 mg/dL — AB (ref 65–99)
GLUCOSE-CAPILLARY: 109 mg/dL — AB (ref 65–99)
GLUCOSE-CAPILLARY: 106 mg/dL — AB (ref 65–99)
Glucose-Capillary: 144 mg/dL — ABNORMAL HIGH (ref 65–99)

## 2016-08-28 LAB — AEROBIC/ANAEROBIC CULTURE W GRAM STAIN (SURGICAL/DEEP WOUND)

## 2016-08-28 LAB — CULTURE, BLOOD (ROUTINE X 2): CULTURE: NO GROWTH

## 2016-08-28 MED ORDER — TRACE MINERALS CR-CU-MN-SE-ZN 10-1000-500-60 MCG/ML IV SOLN
INTRAVENOUS | Status: AC
  Administered 2016-08-28: 18:00:00 via INTRAVENOUS
  Filled 2016-08-28: qty 1992

## 2016-08-28 MED ORDER — LORAZEPAM 0.5 MG PO TABS
0.5000 mg | ORAL_TABLET | Freq: Four times a day (QID) | ORAL | Status: DC | PRN
  Administered 2016-08-28 – 2016-08-30 (×3): 0.5 mg via ORAL
  Filled 2016-08-28 (×3): qty 1

## 2016-08-28 MED ORDER — FAT EMULSION 20 % IV EMUL
250.0000 mL | INTRAVENOUS | Status: AC
  Administered 2016-08-28: 250 mL via INTRAVENOUS
  Filled 2016-08-28: qty 250

## 2016-08-28 NOTE — Progress Notes (Signed)
qPhysical Therapy Treatment Patient Details Name: Lindsey Wolf MRN: [id number] DOB: June 01, 1966 Today's Date: 08/28/2016    History of Present Illness Pt is a 51 year old female status post complicated repair of peptic ulcer disease. She later underwent a repeat laparotomy for drainage of intra-abdominal abscesses. Pt diagnosed with Acute hypoxic respiratory failure with L chest tube placement, septic shock with hypotension, and A-fib with RVR.  Pt also has multiple JP drains and was intubated but is now extubated. Pt underwent L thoracotomy and thoracoscopy along with debridement of abdominal wall abscess on 08/08/16.  Pt now with B chest tubes, JP drains, and wound vac. Of note, small pneumothorax per chart review.  Patient underwent wound vac placement and debridement of abd wound 08/12/16. Pt now has several chest tubes draining to foley bag and additional chest tube to wall suction.    PT Comments    Pt agreeable to PT; reports left sided abdominal/flank pain at a 7/10. Pt wishes to work on exercises, as she notes she has been ambulating with the nursing staff, but is unsure of exercises to perform. Pt participates well with supine bed exercise noting greater strength on the left versus the right, but does not require assist throughout. Pt given written instructions for exercises. Continue PT to progress strength and endurance to improve all functional mobility.    Follow Up Recommendations  SNF     Equipment Recommendations  Rolling walker with 5" wheels    Recommendations for Other Services       Precautions / Restrictions Restrictions Weight Bearing Restrictions: No    Mobility  Bed Mobility               General bed mobility comments: not tested; pt preferred to stay in bed and do exercises. pt notes she has ambulated around nurses station, but is unsure of exercises to perform  Transfers                    Ambulation/Gait                 Stairs             Wheelchair Mobility    Modified Rankin (Stroke Patients Only)       Balance                                            Cognition Arousal/Alertness: Awake/alert Behavior During Therapy: WFL for tasks assessed/performed Overall Cognitive Status: Within Functional Limits for tasks assessed                                        Exercises General Exercises - Lower Extremity Ankle Circles/Pumps: AROM;Both;Other reps (comment);Supine (30) Quad Sets: Strengthening;Both;20 reps;Supine Gluteal Sets: Strengthening;Both;20 reps;Supine Short Arc Quad: AROM;Both;20 reps;Supine Heel Slides: AROM;Both;20 reps;Supine Hip ABduction/ADduction: AROM;Both;20 reps;Supine Straight Leg Raises: Strengthening;Both;10 reps;Supine;Other (comment) (2 sets each )    General Comments        Pertinent Vitals/Pain Pain Assessment: 0-10 Pain Score: 7  Pain Location: L abdomen Pain Descriptors / Indicators: Dull;Constant;Aching Pain Intervention(s): Monitored during session;Limited activity within patient's tolerance    Home Living                      Prior Function  PT Goals (current goals can now be found in the care plan section) Progress towards PT goals: Progressing toward goals    Frequency    Min 2X/week      PT Plan Current plan remains appropriate    Co-evaluation             End of Session   Activity Tolerance: Patient tolerated treatment well Patient left: in bed;with call bell/phone within reach;with bed alarm set   PT Visit Diagnosis: Muscle weakness (generalized) (M62.81);Difficulty in walking, not elsewhere classified (R26.2) Pain - Right/Left: Left Pain - part of body:  (Abdomen)     Time: 3354-5625 PT Time Calculation (min) (ACUTE ONLY): 28 min  Charges:  $Therapeutic Exercise: 23-37 mins                    G CodesLarae Grooms, PTA 08/28/2016, 3:14 PM

## 2016-08-28 NOTE — Progress Notes (Signed)
ID E note Reviewed notes and labs Doing a little better. On TPN now.  WBC down  Continue current meds WIll follow

## 2016-08-28 NOTE — Progress Notes (Signed)
16 Days Post-Op  Subjective: No changes today patient feels well. She has decided to stay at [facility] for now and reserve options of transfer for the future.  Objective: Vital signs in last 24 hours: Temp:  [97.5 F (36.4 C)-98.2 F (36.8 C)] 97.6 F (36.4 C) (03/27 0532) Pulse Rate:  [71-83] 75 (03/27 0532) Resp:  [16-18] 18 (03/27 0532) BP: (103-113)/(65-75) 104/68 (03/27 0532) SpO2:  [98 %-99 %] 99 % (03/27 0532) Weight:  [111 lb 3.2 oz (50.4 kg)] 111 lb 3.2 oz (50.4 kg) (03/27 0500) Last BM Date: 08/26/16  Intake/Output from previous day: 03/26 0701 - 03/27 0700 In: 2598 [P.O.:260; I.V.:2094; IV Piggyback:244] Out: 2135 [Urine:2000; Drains:65; Chest Tube:70] Intake/Output this shift: No intake/output data recorded.  Physical exam:  Bilious drainage in one of the JPs. Purulent drainage in the retroperitoneal drain. Wound VAC in place.  Lab Results: CBC   Recent Labs  08/27/16 0405 08/28/16 0305  WBC 13.9* 14.3*  HGB 9.8* 10.3*  HCT 31.5* 32.2*  PLT 684* 701*   BMET  Recent Labs  08/26/16 0635 08/27/16 0405  NA 137 136  K 4.3 4.7  CL 103 101  CO2 31 30  GLUCOSE 128* 117*  BUN 5* 10  CREATININE 0.34* <0.30*  CALCIUM 7.9* 7.9*   PT/INR No results for input(s): LABPROT, INR in the last 72 hours. ABG No results for input(s): PHART, HCO3 in the last 72 hours.  Invalid input(s): PCO2, PO2  Studies/Results: Dg Chest 2 View  Result Date: 08/28/2016 CLINICAL DATA:  Status post left-sided chest tube placement. Left chest pain. Initial encounter. EXAM: CHEST  2 VIEW COMPARISON:  Chest radiograph and CT of the chest performed 08/22/2016 FINDINGS: The lungs are well-aerated. The patient's left basilar empyema is again noted, with associated airspace opacification. A right-sided nipple shadow is noted. A small right pleural effusion is noted. There is no evidence of pneumothorax. Left-sided chest tubes are grossly unchanged in position. There has been interval  placement of a second left-sided pigtail catheter, about the level of the patient's empyema. A right PICC is noted ending about the distal SVC. The heart is normal in size; the mediastinal contour is within normal limits. No acute osseous abnormalities are seen. IMPRESSION: 1. Relatively stable appearance to left basilar empyema, with associated airspace opacification. New placement of second left-sided pigtail catheter about the level of the empyema. 2. Small right pleural effusion noted. Electronically Signed   By: Garald Balding M.D.   On: 08/28/2016 03:10    Anti-infectives: Anti-infectives    Start     Dose/Rate Route Frequency Ordered Stop   08/16/16 1500  anidulafungin (ERAXIS) 100 mg in sodium chloride 0.9 % 100 mL IVPB     100 mg over 90 Minutes Intravenous Every 24 hours 08/15/16 1428     08/15/16 1500  anidulafungin (ERAXIS) 200 mg in sodium chloride 0.9 % 200 mL IVPB     200 mg over 180 Minutes Intravenous  Once 08/15/16 1428 08/15/16 1822   08/15/16 1430  piperacillin-tazobactam (ZOSYN) IVPB 3.375 g     3.375 g 12.5 mL/hr over 240 Minutes Intravenous Every 8 hours 08/15/16 1428     08/15/16 0730  fluconazole (DIFLUCAN) IVPB 400 mg  Status:  Discontinued     400 mg 100 mL/hr over 120 Minutes Intravenous Every 24 hours 08/15/16 0712 08/15/16 1402   08/11/16 0945  ertapenem (INVANZ) 1 g in sodium chloride 0.9 % 50 mL IVPB  Status:  Discontinued  1 g 100 mL/hr over 30 Minutes Intravenous Every 24 hours 08/11/16 0939 08/15/16 1415   08/05/16 1500  vancomycin (VANCOCIN) IVPB 1000 mg/200 mL premix  Status:  Discontinued     1,000 mg 200 mL/hr over 60 Minutes Intravenous Every 12 hours 08/05/16 0753 08/06/16 0850   08/05/16 0800  vancomycin (VANCOCIN) IVPB 1000 mg/200 mL premix     1,000 mg 200 mL/hr over 60 Minutes Intravenous  Once 08/05/16 0753 08/05/16 0923   07/30/16 1815  fluconazole (DIFLUCAN) IVPB 400 mg  Status:  Discontinued     400 mg 100 mL/hr over 120 Minutes  Intravenous Every 24 hours 07/30/16 1743 08/13/16 1358   07/30/16 1528  piperacillin-tazobactam (ZOSYN) 3.375 GM/50ML IVPB    Comments:  LEWIS, CINDY: cabinet override      07/30/16 1528 07/30/16 1525   07/21/16 0000  piperacillin-tazobactam (ZOSYN) IVPB 3.375 g  Status:  Discontinued     3.375 g 12.5 mL/hr over 240 Minutes Intravenous Every 8 hours 07/20/16 1809 08/11/16 0939   07/20/16 1430  piperacillin-tazobactam (ZOSYN) IVPB 3.375 g     3.375 g 100 mL/hr over 30 Minutes Intravenous  Once 07/20/16 1424 07/20/16 1603      Assessment/Plan: s/p Procedure(s): ,remove 2  JP drains,add one pennrose   White blood cell count stable at 14,000. On TPN. Continue current therapy.  Florene Glen, MD, FACS  08/28/2016

## 2016-08-28 NOTE — Progress Notes (Signed)
She continues to slowly improve. She was able to walk some yesterday. Her appetite remains somewhat poor however. Her white blood cell count is significantly lower than it has been. She continues to drain a moderate amount of fluid from the chest pigtail catheter. This is purulent in nature. We will continue to irrigate this catheter twice per day with 20 cc of saline. I did rearrange her chest tube some yesterday and plan on doing so tomorrow or Thursday.

## 2016-08-28 NOTE — Progress Notes (Signed)
Patient refused ambulation today

## 2016-08-28 NOTE — Clinical Social Work Note (Signed)
Patient's authorization to go to [facility] has expired due to her prolonged hospitalization. New information will need to be sent to the insurance for review for a reauth. Shela Leff MSW,LCSW [phone]

## 2016-08-28 NOTE — Care Management (Signed)
Discussed option of  transfer to [facility] with attending.  Does not feel it is appropriate course of action at this time.

## 2016-08-29 LAB — GLUCOSE, CAPILLARY
GLUCOSE-CAPILLARY: 122 mg/dL — AB (ref 65–99)
GLUCOSE-CAPILLARY: 99 mg/dL (ref 65–99)
GLUCOSE-CAPILLARY: 98 mg/dL (ref 65–99)
Glucose-Capillary: 125 mg/dL — ABNORMAL HIGH (ref 65–99)

## 2016-08-29 MED ORDER — INSULIN ASPART 100 UNIT/ML ~~LOC~~ SOLN
0.0000 [IU] | Freq: Four times a day (QID) | SUBCUTANEOUS | Status: DC
  Filled 2016-08-29: qty 1

## 2016-08-29 MED ORDER — INSULIN ASPART 100 UNIT/ML ~~LOC~~ SOLN
0.0000 [IU] | Freq: Four times a day (QID) | SUBCUTANEOUS | Status: DC
  Administered 2016-08-29 – 2016-09-06 (×12): 1 [IU] via SUBCUTANEOUS
  Filled 2016-08-29 (×12): qty 1

## 2016-08-29 MED ORDER — TRACE MINERALS CR-CU-MN-SE-ZN 10-1000-500-60 MCG/ML IV SOLN
INTRAVENOUS | Status: AC
  Filled 2016-08-29: qty 1992

## 2016-08-29 MED ORDER — FAT EMULSION 20 % IV EMUL
250.0000 mL | INTRAVENOUS | Status: AC
  Administered 2016-08-29: 250 mL via INTRAVENOUS
  Filled 2016-08-29: qty 250

## 2016-08-29 MED ORDER — TRACE MINERALS CR-CU-MN-SE-ZN 10-1000-500-60 MCG/ML IV SOLN
INTRAVENOUS | Status: AC
  Administered 2016-08-29: 19:00:00 via INTRAVENOUS
  Filled 2016-08-29: qty 1992

## 2016-08-29 NOTE — Progress Notes (Signed)
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  Plan is for patient to be NPO for bowel rest and to re-start TPN. She was started on octreotide.  Patient Measurements: Height: 5\' 10"  (177.8 cm) Weight: 120 lb 4.8 oz (54.6 kg) IBW/kg (Calculated) : 68.5 TPN AdjBW (KG): 59.4 Body mass index is 17.26 kg/m.  Assessment:   GI:  Endo:  Insulin requirements in the past 24 hours: 3 units  Lytes: WNL Renal: Pulm: Cards:  Hepatobil: Neuro: ID:  Best Practices: TPN Access: Triple Lumen PICC placed 3/24 TPN start date: 3/24  Nutritional Goals (per RD recommendation on 3/25): kCal: 1700-1900 (MSJ x 1.3-1.5) Protein: 85-115 grams (1.5-2 grams/kg)  Current Nutrition: Clear liquids (3/26)  Plan:   Continue Clinimix E 5/15 @ 83 ml/hr + 20% ILE @ 20 ml/hr over 12 hours. Goal regimen provides 1894 kcal, 99.6 grams of protein, 2472 ml fluid daily. Patient on KCL 30meq PO BID. Next Electrolytes to be checked tomorrow-Thursday 3/29. SSI Q6hr.   Pharmacy will continue to monitor and adjust per consult.   Cydne Grahn A 08/29/2016,7:48 AM

## 2016-08-29 NOTE — Clinical Social Work Note (Signed)
MD informed CSW that patient would also need to discharge on TPN along with her chest tubes and wound vac. CSW informed Marden Noble at [facility] and he stated that they can still accommodate this. He is going to review patient's information to see if he has what he needs for reauth from [facility] and if not, he is going to call CSW. Doug at [facility] stated that they would need the script for the TPN 24 hours prior to patient coming so that they can order the TPN. Shela Leff MSW,LCSW [phone]

## 2016-08-29 NOTE — Progress Notes (Signed)
PT Cancellation Note  Patient Details Name: Lindsey Wolf MRN: [id number] DOB: Dec 31, 1965   Cancelled Treatment:    Reason Eval/Treat Not Completed: Patient declined, no reason specified;Other (comment). Treatment attempted; pt refused. Pt states she is too tired and in general does not feel well at this moment. Pt states she may be able to try later this afternoon. Offered to pt that PT would attempt if the schedule allows. Pt understands. Re attempt at a later time/date as the schedule allows.    Larae Grooms, PTA 08/29/2016, 12:31 PM

## 2016-08-29 NOTE — Consult Note (Signed)
[place] Nurse wound consult note Reason for Consult: Midline abdominal wound with NPWT.  Penrose drain to LLQ in place.  Wound type:Surgical wound Pressure Injury POA: N/A Measurement: 14 cm x 2 cm x 0.3 cm with undermining at 6 o'clock, extends 1.3 cm  Wound JSE:[id number] tissue noted.  Tissue is friable at distal ends, bleeds with cleansing.  Drainage (amount, consistency, odor) Minimal serosanguinous.  No odor Periwound:Intact.  Drain to LLQ  And left side Chest tube Dressing procedure/placement/frequency:NPWT dressing 1 piece black foam, change MON/WED/FRI WOC team will follow.   Domenic Moras RN BSN [place] Pager [phone]

## 2016-08-29 NOTE — Progress Notes (Signed)
ANTIBIOTIC/ANTIFUnGAL CONSULT NOTE - FOLLOW UP  Pharmacy Consult for Zosyn and Eraxis Indication: intra-abdominal infection  No Known Allergies  Patient Measurements: Height: 5\' 10"  (177.8 cm) Weight: 120 lb 4.8 oz (54.6 kg) IBW/kg (Calculated) : 68.5  Vital Signs: Temp: 97.7 F (36.5 C) (03/28 0350) Temp Source: Oral (03/28 0350) BP: 115/74 (03/28 0350) Pulse Rate: 76 (03/28 0350) Intake/Output from previous day: 03/27 0701 - 03/28 0700 In: 2950 [P.O.:480; I.V.:2270; IV Piggyback:200] Out: 2530 [Urine:2450; Drains:80] Intake/Output from this shift: No intake/output data recorded.  Labs:  Recent Labs  08/27/16 0405 08/28/16 0305  WBC 13.9* 14.3*  HGB 9.8* 10.3*  PLT 684* 701*  CREATININE <0.30*  --    CrCl cannot be calculated (This lab value cannot be used to calculate CrCl because it is not a number: <0.30).  Assessment: Pharmacy consulted to dose piperacillin/tazobactam and Eraxis in this 51 year old female with intrabdominal infection post perforated peptic ulcer. * ID following, see ID Notes as per 3/23- plan several more weeks of IV Zosyn, plan switch to po fluconazole at discharge.  Plan:  Continue Zosyn 3.375 g IV q8 hours and Eraxis 100 mg IV q24 hours.   Noralee Space, PharmD Clinical Pharmacist 08/29/2016,8:02 AM

## 2016-08-29 NOTE — Progress Notes (Signed)
Handoff report given to Beth Richardson, RN. 

## 2016-08-29 NOTE — Progress Notes (Signed)
Lindsey Wolf Date of Admission:  07/20/2016     ID: Lindsey Wolf is a 51 y.o. female with  intrabdominal infection post perforated peptic ulcer Active Problems:   Perforated ulcer ([place])   Abdominal abscess ([place])   Abnormal respirations   Pleural effusion   Respiratory distress   Empyema of left pleural space (HCC)   Protein-calorie malnutrition, severe   Subjective: A little brighter, no fevers, R abd drain removed. On TPN  ROS  Eleven systems are reviewed and negative except per hpi  Medications:  Antibiotics Given (last 72 hours)    Date/Time Action Medication Dose Rate   08/26/16 2253 Given   piperacillin-tazobactam (ZOSYN) IVPB 3.375 g 3.375 g 12.5 mL/hr   08/27/16 0606 Given   piperacillin-tazobactam (ZOSYN) IVPB 3.375 g 3.375 g 12.5 mL/hr   08/27/16 1510 Given   piperacillin-tazobactam (ZOSYN) IVPB 3.375 g 3.375 g 12.5 mL/hr   08/27/16 2209 Given   piperacillin-tazobactam (ZOSYN) IVPB 3.375 g 3.375 g 12.5 mL/hr   08/28/16 0544 Given   piperacillin-tazobactam (ZOSYN) IVPB 3.375 g 3.375 g 12.5 mL/hr   08/28/16 1537 Given   piperacillin-tazobactam (ZOSYN) IVPB 3.375 g 3.375 g 12.5 mL/hr   08/28/16 2211 Given   piperacillin-tazobactam (ZOSYN) IVPB 3.375 g 3.375 g 12.5 mL/hr   08/29/16 7517 Given   piperacillin-tazobactam (ZOSYN) IVPB 3.375 g 3.375 g 12.5 mL/hr   08/29/16 1502 Given   piperacillin-tazobactam (ZOSYN) IVPB 3.375 g 3.375 g 12.5 mL/hr     . anidulafungin  100 mg Intravenous Q24H  . enoxaparin (LOVENOX) injection  40 mg Subcutaneous Q24H  . FLUoxetine  40 mg Oral q morning - 10a  . insulin aspart  0-9 Units Subcutaneous Q6H  . mouth rinse  15 mL Mouth Rinse BID  . nystatin  5 mL Oral QID  . octreotide  100 mcg Subcutaneous Q8H  . pantoprazole (PROTONIX) IV  40 mg Intravenous Q12H  . piperacillin-tazobactam (ZOSYN)  IV  3.375 g Intravenous Q8H  . polyethylene glycol  17 g Oral Daily  . potassium chloride  40 mEq  Oral BID  . sodium chloride flush  20 mL Intracatheter Q12H    Objective: Vital signs in last 24 hours: Temp:  [97.7 F (36.5 C)-98 F (36.7 C)] 97.7 F (36.5 C) (03/28 0350) Pulse Rate:  [76-87] 76 (03/28 0350) Resp:  [18-19] 19 (03/28 0350) BP: (99-115)/(54-74) 115/74 (03/28 0350) SpO2:  [98 %-99 %] 98 % (03/28 0350) Weight:  [54.6 kg (120 lb 4.8 oz)] 54.6 kg (120 lb 4.8 oz) (03/28 0500) Constitutional:  Ill appearing HENT: [place]/AT, PERRLA, no scleral icterus Mouth/Throat: Oropharynx is clear and dry. No oropharyngeal exudate.  Cardiovascular: Normal rate, regular rhythm and normal heart sounds.  Pulmonary/Chest: bil rhonchi Bil chest tubes with ss drainage Neck = supple, no nuchal rigidity Abdominal: Soft. abd mildly distended, inciision along midline covered with wound vac 1 jp L with bilous drainage, L chest drain with purulent material.  Lymphadenopathy: no cervical adenopathy. No axillary adenopathy Neurological: alert and oriented to person, place, and time.  Skin: Skin is warm and dry. No rash noted. No erythema.  PICC RUE wnl wnl Psychiatric: a normal mood and affect.  behavior is normal.   Lab Results  Recent Labs  08/27/16 0405 08/28/16 0305  WBC 13.9* 14.3*  HGB 9.8* 10.3*  HCT 31.5* 32.2*  NA 136  --   K 4.7  --   CL 101  --   CO2 30  --  BUN 10  --   CREATININE <0.30*  --     Microbiology: Results for orders placed or performed during the hospital encounter of 07/20/16  [place] rt PCR ([place] only)     Status: None   Collection Time: 07/20/16  1:15 PM  Result Value Ref Range Status   Specimen source GC/Chlam ENDOCERVICAL  Final   Chlamydia Tr NOT DETECTED NOT DETECTED Final   N gonorrhoeae NOT DETECTED NOT DETECTED Final    Comment: (Wolf) 100  This methodology has not been evaluated in pregnant women or in 200  patients with a history of hysterectomy. 300 400  This methodology will not be performed on patients less than 14  years of  age.   Wet prep, genital     Status: Abnormal   Collection Time: 07/20/16  1:15 PM  Result Value Ref Range Status   Yeast Wet Prep HPF POC NONE SEEN NONE SEEN Final   Trich, Wet Prep NONE SEEN NONE SEEN Final   Clue Cells Wet Prep HPF POC NONE SEEN NONE SEEN Final   WBC, Wet Prep HPF POC RARE (A) NONE SEEN Final   Sperm NONE SEEN  Final  Blood culture (routine x 2)     Status: None   Collection Time: 07/20/16  3:07 PM  Result Value Ref Range Status   Specimen Description BLOOD left forearm  Final   Special Requests   Final    BOTTLES DRAWN AEROBIC AND ANAEROBIC AER12ML ANA12ML   Culture NO GROWTH 5 DAYS  Final   Report Status 07/25/2016 FINAL  Final  MRSA PCR Screening     Status: None   Collection Time: 07/20/16  9:29 PM  Result Value Ref Range Status   MRSA by PCR NEGATIVE NEGATIVE Final    Comment:        The GeneXpert MRSA Assay (FDA approved for NASAL specimens only), is one component of a comprehensive MRSA colonization surveillance program. It is not intended to diagnose MRSA infection nor to guide or monitor treatment for MRSA infections.   Aerobic/Anaerobic Culture (surgical/deep wound)     Status: None   Collection Time: 07/25/16  3:00 PM  Result Value Ref Range Status   Specimen Description ABSCESS RIGHT ABDOMEN  Final   Special Requests NONE  Final   Gram Stain   Final    RARE WBC PRESENT, PREDOMINANTLY PMN NO ORGANISMS SEEN    Culture   Final    No growth aerobically or anaerobically. Performed at [facility], [place] [address], [place], [place] [address]    Report Status 07/31/2016 FINAL  Final  Body fluid culture     Status: None   Collection Time: 07/30/16 11:40 AM  Result Value Ref Range Status   Specimen Description PLEURAL  Final   Special Requests NONE  Final   Gram Stain   Final    MODERATE WBC PRESENT, PREDOMINANTLY PMN NO ORGANISMS SEEN    Culture   Final    No growth aerobically or anaerobically. Performed at [facility] [place], [place] [address], [place], [place] [address]    Report Status 08/03/2016 FINAL  Final  Acid Fast Smear (AFB)     Status: None   Collection Time: 07/30/16 11:40 AM  Result Value Ref Range Status   AFB Specimen Processing Concentration  Final   Acid Fast Smear Negative  Final    Comment: (Wolf) Performed At: [facility] [address] [place], [place] [id number] Darrel Hoover  F MD NO:[id number]    Source (AFB) PLEURAL  Final  Aerobic Culture (superficial specimen)     Status: None   Collection Time: 08/06/16  3:34 PM  Result Value Ref Range Status   Specimen Description ABDOMEN  Final   Special Requests Normal  Final   Gram Stain   Final    RARE WBC PRESENT,BOTH PMN AND MONONUCLEAR NO ORGANISMS SEEN Performed at [facility], [address] [place], [place] [address]    Culture RARE ESCHERICHIA COLI  Final   Report Status 08/09/2016 FINAL  Final   Organism ID, Bacteria ESCHERICHIA COLI  Final      Susceptibility   Escherichia coli - MIC*    AMPICILLIN <=2 SENSITIVE Sensitive     CEFAZOLIN <=4 SENSITIVE Sensitive     CEFEPIME <=1 SENSITIVE Sensitive     CEFTAZIDIME <=1 SENSITIVE Sensitive     CEFTRIAXONE <=1 SENSITIVE Sensitive     CIPROFLOXACIN <=0.25 SENSITIVE Sensitive     GENTAMICIN <=1 SENSITIVE Sensitive     IMIPENEM <=0.25 SENSITIVE Sensitive     TRIMETH/SULFA <=20 SENSITIVE Sensitive     AMPICILLIN/SULBACTAM <=2 SENSITIVE Sensitive     PIP/TAZO <=4 SENSITIVE Sensitive     Extended ESBL NEGATIVE Sensitive     * RARE ESCHERICHIA COLI  Fungus Culture With Stain     Status: None   Collection Time: 08/08/16  3:31 PM  Result Value Ref Range Status   Fungus Stain Final report  Final   Fungus (Mycology) Culture Preliminary report  Final    Comment: (Wolf) Performed At: [facility] [address] [place], [place] [id number] Lindon Romp MD [id number]    Fungal Source ABDOMEN  Final    Comment: ABDOMINAL WALL WOUND  GOES WITH ACC  [id number]   Fungus Culture With Stain     Status: None (Preliminary result)   Collection Time: 08/08/16  3:31 PM  Result Value Ref Range Status   Fungus Stain Final report  Final    Comment: (Wolf) Performed At: [facility] [place], [place] [id number] Lindon Romp MD [id number]    Fungus (Mycology) Culture PENDING  Incomplete   Fungal Source ABDOMEN  Final    Comment: ABDOMINAL WALL 2  GOES WITH ACC [id number]   Fungus Culture With Stain     Status: None (Preliminary result)   Collection Time: 08/08/16  3:31 PM  Result Value Ref Range Status   Fungus Stain Final report  Final    Comment: (Wolf) Performed At: [facility] [place], [place] [id number] Lindon Romp MD [id number]:[id number]    Fungus (Mycology) Culture PENDING  Incomplete   Fungal Source PLEURAL  Final    Comment: RIGHT PLEURAL SPACE  Fungus Culture With Stain     Status: None (Preliminary result)   Collection Time: 08/08/16  3:31 PM  Result Value Ref Range Status   Fungus Stain Final report  Final    Comment: (Wolf) Performed At: [facility] [place], [place] [id number] Lindon Romp MD JG:[id number]    Fungus (Mycology) Culture PENDING  Incomplete   Fungal Source PLEURAL  Final    Comment: LEFT PLEURAL SPACE  Aerobic/Anaerobic Culture (surgical/deep wound)     Status: None   Collection Time: 08/08/16  3:31 PM  Result Value Ref Range Status   Specimen Description WOUND  Final   Special Requests ABDOMINAL WALL WOUND  Final   Gram Stain   Final  ABUNDANT WBC PRESENT, PREDOMINANTLY PMN RARE GRAM POSITIVE COCCI IN PAIRS Performed at [facility], [place] [address], [place], [place] [address]    Culture   Final    FEW ESCHERICHIA COLI RARE ACTINOMYCES ODONTOLYTICUS SUSCEPTIBILITIES PERFORMED ON PREVIOUS CULTURE WITHIN THE LAST 5 DAYS. MIXED ANAEROBIC FLORA PRESENT.  CALL LAB IF FURTHER IID REQUIRED.    Report Status 08/13/2016  FINAL  Final  Aerobic/Anaerobic Culture (surgical/deep wound)     Status: None   Collection Time: 08/08/16  3:31 PM  Result Value Ref Range Status   Specimen Description WOUND  Final   Special Requests ABDOMINAL WALL WOUND 2  Final   Gram Stain   Final    ABUNDANT WBC PRESENT, PREDOMINANTLY PMN RARE GRAM POSITIVE COCCI IN PAIRS FEW GRAM POSITIVE RODS    Culture   Final    RARE ESCHERICHIA COLI RARE ACTINOMYCES ODONTOLYTICUS FEW BACTEROIDES FRAGILIS RARE BACTEROIDES THETAIOTAOMICRON BETA LACTAMASE POSITIVE FOR BOTH BACTEROIDES SPECIES Performed at [facility], [place] [address] [place], [place] [address]    Report Status 08/13/2016 FINAL  Final   Organism ID, Bacteria ESCHERICHIA COLI  Final      Susceptibility   Escherichia coli - MIC*    AMPICILLIN 4 SENSITIVE Sensitive     CEFAZOLIN <=4 SENSITIVE Sensitive     CEFEPIME <=1 SENSITIVE Sensitive     CEFTAZIDIME <=1 SENSITIVE Sensitive     CEFTRIAXONE <=1 SENSITIVE Sensitive     CIPROFLOXACIN <=0.25 SENSITIVE Sensitive     GENTAMICIN <=1 SENSITIVE Sensitive     IMIPENEM <=0.25 SENSITIVE Sensitive     TRIMETH/SULFA <=20 SENSITIVE Sensitive     AMPICILLIN/SULBACTAM <=2 SENSITIVE Sensitive     PIP/TAZO <=4 SENSITIVE Sensitive     Extended ESBL NEGATIVE Sensitive     * RARE ESCHERICHIA COLI  Aerobic/Anaerobic Culture (surgical/deep wound)     Status: None   Collection Time: 08/08/16  3:31 PM  Result Value Ref Range Status   Specimen Description WOUND  Final   Special Requests RIGHT PLURAL SPACE  Final   Gram Stain   Final    FEW WBC PRESENT,BOTH PMN AND MONONUCLEAR NO ORGANISMS SEEN    Culture   Final    NO GROWTH 5 DAYS Performed at [facility], [address] [place], [place] [address]    Report Status 08/13/2016 FINAL  Final  Aerobic/Anaerobic Culture (surgical/deep wound)     Status: None   Collection Time: 08/08/16  3:31 PM  Result Value Ref Range Status   Specimen Description WOUND  Final   Special  Requests LEFT PLERAL SPACE  Final   Gram Stain   Final    FEW WBC PRESENT,BOTH PMN AND MONONUCLEAR NO ORGANISMS SEEN    Culture   Final    MODERATE BACTEROIDES FRAGILIS BETA LACTAMASE POSITIVE Performed at [facility], [place] [address] [place],  [address]    Report Status 08/13/2016 FINAL  Final  Fungus Culture With Stain     Status: None (Preliminary result)   Collection Time: 08/08/16  3:31 PM  Result Value Ref Range Status   Fungus Stain Final report  Final    Comment: (Wolf) Performed At: [facility] [place], [place] [id number] Lindon Romp MD [id number]:[id number]    Fungus (Mycology) Culture PENDING  Incomplete   Fungal Source PLEURAL  Final    Comment: PLEURAL PEEL  Aerobic/Anaerobic Culture (surgical/deep wound)     Status: None   Collection  Time: 08/08/16  3:31 PM  Result Value Ref Range Status   Specimen Description WOUND  Final   Special Requests PLEURAL PEEL LEFT  Final   Gram Stain   Final    FEW WBC PRESENT,BOTH PMN AND MONONUCLEAR NO ORGANISMS SEEN Performed at [facility], [address], [place], [place] [address]    Culture   Final    FEW BACTEROIDES FRAGILIS BETA LACTAMASE POSITIVE RARE CLOSTRIDIUM CADAVERIS    Report Status 08/13/2016 FINAL  Final  Fungus Culture Result     Status: None   Collection Time: 08/08/16  3:31 PM  Result Value Ref Range Status   Result 1 Comment  Final    Comment: (Wolf) KOH/Calcofluor preparation:  no fungus observed. Performed At: [facility] [place], [place] [id number] Lindon Romp MD [id number]:[id number]   Fungus Culture Result     Status: None   Collection Time: 08/08/16  3:31 PM  Result Value Ref Range Status   Result 1 Comment  Final    Comment: (Wolf) KOH/Calcofluor preparation:  no fungus observed. Performed At: [facility] [place], [place] [id number] Lindon Romp MD SH:[id number]   Fungus Culture Result     Status:  None   Collection Time: 08/08/16  3:31 PM  Result Value Ref Range Status   Result 1 Comment  Final    Comment: (Wolf) KOH/Calcofluor preparation:  no fungus observed. Performed At: [facility] [place], [place] [id number] Lindon Romp MD NO:[id number]   Fungus Culture Result     Status: None   Collection Time: 08/08/16  3:31 PM  Result Value Ref Range Status   Result 1 Comment  Final    Comment: (Wolf) KOH/Calcofluor preparation:  no fungus observed. Performed At: [facility] [place], [place] [id number] Lindon Romp MD [id number]   Fungus Culture Result     Status: None   Collection Time: 08/08/16  3:31 PM  Result Value Ref Range Status   Result 1 Comment  Final    Comment: (Wolf) KOH/Calcofluor preparation:  no fungus observed. Performed At: [facility] [place], [place] [id number] Lindon Romp MD GY:[id number]   Fungal organism reflex     Status: None   Collection Time: 08/08/16  3:31 PM  Result Value Ref Range Status   Fungal result 1 Candida albicans  Final    Comment: (Wolf) Light growth Performed At: [facility] [place], [place] [id number] Lindon Romp MD [id number]   Aerobic/Anaerobic Culture (surgical/deep wound)     Status: None   Collection Time: 08/12/16  8:34 AM  Result Value Ref Range Status   Specimen Description WOUND PERITONEAL SITE FOR WOUND VAC DRESSING  Final   Special Requests NONE  Final   Gram Stain   Final    FEW WBC PRESENT, PREDOMINANTLY PMN RARE GRAM VARIABLE COCCI Performed at [facility], [place] [address], [place], [place] [address]    Culture   Final    RARE ESCHERICHIA COLI MIXED ANAEROBIC FLORA PRESENT.  CALL LAB IF FURTHER IID REQUIRED.    Report Status 08/18/2016 FINAL  Final   Organism ID, Bacteria ESCHERICHIA COLI  Final      Susceptibility   Escherichia coli - MIC*    AMPICILLIN >=32 RESISTANT Resistant      CEFAZOLIN >=64 RESISTANT Resistant     CEFEPIME <=1 SENSITIVE Sensitive     CEFTAZIDIME <=  1 SENSITIVE Sensitive     CEFTRIAXONE <=1 SENSITIVE Sensitive     CIPROFLOXACIN <=0.25 SENSITIVE Sensitive     GENTAMICIN <=1 SENSITIVE Sensitive     IMIPENEM <=0.25 SENSITIVE Sensitive     TRIMETH/SULFA <=20 SENSITIVE Sensitive     AMPICILLIN/SULBACTAM >=32 RESISTANT Resistant     PIP/TAZO <=4 SENSITIVE Sensitive     Extended ESBL NEGATIVE Sensitive     * RARE ESCHERICHIA COLI  Aerobic/Anaerobic Culture (surgical/deep wound)     Status: None   Collection Time: 08/17/16 12:30 PM  Result Value Ref Range Status   Specimen Description DRAINAGE  Final   Special Requests Normal  Final   Gram Stain   Final    FEW WBC PRESENT, PREDOMINANTLY PMN RARE GRAM POSITIVE COCCI IN PAIRS    Culture   Final    FEW BACTEROIDES FRAGILIS BETA LACTAMASE POSITIVE Performed at [facility], [place] [address] [place], [place] [address]    Report Status 08/22/2016 FINAL  Final  CULTURE, BLOOD (ROUTINE X 2) w Reflex to ID Panel     Status: None   Collection Time: 08/22/16  1:51 PM  Result Value Ref Range Status   Specimen Description BLOOD RIGHT HAND  Final   Special Requests BOTTLES DRAWN AEROBIC AND ANAEROBIC BCAV  Final   Culture NO GROWTH 5 DAYS  Final   Report Status 08/27/2016 FINAL  Final  CULTURE, BLOOD (ROUTINE X 2) w Reflex to ID Panel     Status: None   Collection Time: 08/23/16  6:05 AM  Result Value Ref Range Status   Specimen Description BLOOD R HAND  Final   Special Requests IN BOTH AEROBIC AND ANAEROBIC BOTTLES BCAV  Final   Culture NO GROWTH 5 DAYS  Final   Report Status 08/28/2016 FINAL  Final  Aerobic/Anaerobic Culture (surgical/deep wound)     Status: None   Collection Time: 08/23/16  3:20 PM  Result Value Ref Range Status   Specimen Description DRAINAGE  Final   Special Requests Normal  Final   Gram Stain   Final    ABUNDANT WBC PRESENT,BOTH PMN AND MONONUCLEAR FEW GRAM POSITIVE  COCCI IN PAIRS IN CLUSTERS RARE GRAM VARIABLE ROD Performed at [facility], [place] [address], [place], [place] [address]    Culture   Final    MODERATE ESCHERICHIA COLI MIXED ANAEROBIC FLORA PRESENT.  CALL LAB IF FURTHER IID REQUIRED.    Report Status 08/28/2016 FINAL  Final   Organism ID, Bacteria ESCHERICHIA COLI  Final      Susceptibility   Escherichia coli - MIC*    AMPICILLIN 8 SENSITIVE Sensitive     CEFAZOLIN <=4 SENSITIVE Sensitive     CEFEPIME <=1 SENSITIVE Sensitive     CEFTAZIDIME <=1 SENSITIVE Sensitive     CEFTRIAXONE <=1 SENSITIVE Sensitive     CIPROFLOXACIN <=0.25 SENSITIVE Sensitive     GENTAMICIN <=1 SENSITIVE Sensitive     IMIPENEM <=0.25 SENSITIVE Sensitive     TRIMETH/SULFA <=20 SENSITIVE Sensitive     AMPICILLIN/SULBACTAM <=2 SENSITIVE Sensitive     PIP/TAZO <=4 SENSITIVE Sensitive     Extended ESBL NEGATIVE Sensitive     * MODERATE ESCHERICHIA COLI    Studies/Results: Dg Chest 2 View  Result Date: 08/28/2016 CLINICAL DATA:  Status post left-sided chest tube placement. Left chest pain. Initial encounter. EXAM: CHEST  2 VIEW COMPARISON:  Chest radiograph and CT of the chest performed 08/22/2016 FINDINGS: The lungs are well-aerated. The patient's left basilar  empyema is again noted, with associated airspace opacification. A right-sided nipple shadow is noted. A small right pleural effusion is noted. There is no evidence of pneumothorax. Left-sided chest tubes are grossly unchanged in position. There has been interval placement of a second left-sided pigtail catheter, about the level of the patient's empyema. A right PICC is noted ending about the distal SVC. The heart is normal in size; the mediastinal contour is within normal limits. No acute osseous abnormalities are seen. IMPRESSION: 1. Relatively stable appearance to left basilar empyema, with associated airspace opacification. New placement of second left-sided pigtail catheter about the level of the  empyema. 2. Small right pleural effusion noted. Electronically Signed   By: Garald Balding M.D.   On: 08/28/2016 03:10    Assessment/Plan: ZUNAIRAH DEVERS is a 51 y.o. female with perforated peptic ulcer, with complicated course since admission with placement of an IR drain and then repeat surgery 2/26. She has been on zosyn since admission and started fluconazole 2/26 - changed to eraxis 3/14.  Multiple drains in place.  Had L empyema noted and s/p decortication with about 500 cc pus aspirated with cx growing bacteroides, and clostridium and candida. Wound cx with E coli, actinomyces. - E coli- sensitive to zosyn.  WBC down 39-> 16.7 after surgery but increased and found to have loculated fluid on L chest  3/16 had repeat drain on L - cx with bacteroides Her R abd JP drain has some purulent drainage 3/19 - wbc decreasing. Remains weak 3/21 wbc up to 30. CT reviewed. Grant done.  3/23- 2 new drains placed by IR 3/22 with purulent drainage - GS with abundant wbc, GPC in pairs and clusters and gram variable rods. Draining some thin bilious material. Also upsize of L chest drain - some purulence in that drain now.Also had to have picc removed as not functioning but replaced 3/28 -  WBC down to 14, no pain.   Recommendations Will need several more weeks of IV zosyn (ertapenem does not cover pseudomonas nor enterococcus two common pathogens in cases such as this) Cont  eraxis given risk of resistance but once ready for dc can change back to fluconazole 200 qd Continue management of chest tubes per Dr Genevive Bi and abd drains by surgery I will be out of town until Monday but if she is dced prior to then please see IV abx order sheet from 3/16 for zosyn  Thank you very much for the consult. Will follow with you.  Triva Hueber P   08/29/2016, 3:06 PM

## 2016-08-29 NOTE — Progress Notes (Signed)
s/p perforated ulcer and laparotomy x 2 Empyema s/p thoracotomy and decortication s/p placement of two new abd drain, one on anterior abdominal wall draining an interloop collection and another on torso draining perisplenic abscess s/p exchange of chest pigtail  AVSS Tolerating clear JP 80cc  PE NAD CHest tube in place Abd: vac in place two drain one w purulent drainage and the other w bile. No peritontiis  A/p Doing well Placement pending No surgical issues at this time

## 2016-08-29 NOTE — Progress Notes (Signed)
Chaplain rounding the unit visited the Pt. Pt was in the Rm with her best friend. This was a follow-up visit with the Pt whom the [facility] had seen a while ago. Pt appeared to be doing better and talked about the progress she is making towards recovery. Pt was in a great spirit and made some jokes with her friend Lattie Haw. At the patient's request, Chaplain offered prayers for the patient and for her friend. [place] informed Pt that [facility] services are available as needed.    08/29/16 1600  Clinical Encounter Type  Visited With Patient;Other (Comment)  Visit Type Follow-up;Spiritual support  Referral From Chaplain  Consult/Referral To Chaplain  Spiritual Encounters  Spiritual Needs Prayer;Other (Comment)

## 2016-08-30 LAB — COMPREHENSIVE METABOLIC PANEL
ALK PHOS: 142 U/L — AB (ref 38–126)
ALT: 10 U/L — ABNORMAL LOW (ref 14–54)
ANION GAP: 4 — AB (ref 5–15)
AST: 17 U/L (ref 15–41)
Albumin: 1.9 g/dL — ABNORMAL LOW (ref 3.5–5.0)
BUN: 14 mg/dL (ref 6–20)
CALCIUM: 8.1 mg/dL — AB (ref 8.9–10.3)
CO2: 31 mmol/L (ref 22–32)
Chloride: 101 mmol/L (ref 101–111)
Creatinine, Ser: 0.38 mg/dL — ABNORMAL LOW (ref 0.44–1.00)
GFR calc non Af Amer: >60 mL/min (ref >60)
Glucose, Bld: 103 mg/dL — ABNORMAL HIGH (ref 65–99)
Potassium: 4.8 mmol/L (ref 3.5–5.1)
SODIUM: 136 mmol/L (ref 135–145)
TOTAL PROTEIN: 5.7 g/dL — AB (ref 6.5–8.1)
Total Bilirubin: 0.2 mg/dL — ABNORMAL LOW (ref 0.3–1.2)

## 2016-08-30 LAB — CBC
HCT: 34.3 % — ABNORMAL LOW (ref 35.0–47.0)
Hemoglobin: 10.7 g/dL — ABNORMAL LOW (ref 12.0–16.0)
MCH: 25.9 pg — ABNORMAL LOW (ref 26.0–34.0)
MCHC: 31.2 g/dL — ABNORMAL LOW (ref 32.0–36.0)
MCV: 83 fL (ref 80.0–100.0)
PLATELETS: 690 10*3/uL — AB (ref 150–440)
RBC: 4.13 MIL/uL (ref 3.80–5.20)
RDW: 22.5 % — AB (ref 11.5–14.5)
WBC: 16.7 10*3/uL — AB (ref 3.6–11.0)

## 2016-08-30 LAB — GLUCOSE, CAPILLARY
GLUCOSE-CAPILLARY: 127 mg/dL — AB (ref 65–99)
Glucose-Capillary: 101 mg/dL — ABNORMAL HIGH (ref 65–99)
Glucose-Capillary: 114 mg/dL — ABNORMAL HIGH (ref 65–99)
Glucose-Capillary: 132 mg/dL — ABNORMAL HIGH (ref 65–99)
Glucose-Capillary: 132 mg/dL — ABNORMAL HIGH (ref 65–99)

## 2016-08-30 LAB — MAGNESIUM: Magnesium: 1.8 mg/dL (ref 1.7–2.4)

## 2016-08-30 LAB — PHOSPHORUS: PHOSPHORUS: 4.1 mg/dL (ref 2.5–4.6)

## 2016-08-30 MED ORDER — ENSURE ENLIVE PO LIQD: 237.0000 mL | Freq: Two times a day (BID) | ORAL | Status: DC

## 2016-08-30 MED ORDER — OXYCODONE HCL 5 MG PO TABS
5.0000 mg | ORAL_TABLET | ORAL | Status: DC | PRN
Start: 2016-08-30 — End: 2016-09-03
  Administered 2016-08-30 – 2016-09-02 (×11): 10 mg via ORAL
  Filled 2016-08-30 (×11): qty 2

## 2016-08-30 MED ORDER — GABAPENTIN 600 MG PO TABS
300.0000 mg | ORAL_TABLET | Freq: Three times a day (TID) | ORAL | Status: DC
  Administered 2016-08-30 – 2016-09-14 (×46): 300 mg via ORAL
  Filled 2016-08-30 (×14): qty 1
  Filled 2016-08-30: qty 2
  Filled 2016-08-30: qty 1
  Filled 2016-08-30: qty 2
  Filled 2016-08-30 (×29): qty 1

## 2016-08-30 MED ORDER — TRACE MINERALS CR-CU-MN-SE-ZN 10-1000-500-60 MCG/ML IV SOLN
INTRAVENOUS | Status: AC
  Administered 2016-08-30: 18:00:00 via INTRAVENOUS
  Filled 2016-08-30: qty 1992

## 2016-08-30 MED ORDER — OCTREOTIDE ACETATE 50 MCG/ML IJ SOLN
100.0000 ug | Freq: Three times a day (TID) | INTRAVENOUS | Status: DC
  Filled 2016-08-30 (×2): qty 2

## 2016-08-30 MED ORDER — OCTREOTIDE ACETATE 50 MCG/ML IJ SOLN
100.0000 ug | Freq: Three times a day (TID) | INTRAVENOUS | Status: DC
  Administered 2016-08-30 – 2016-09-12 (×39): 100 ug via INTRAVENOUS
  Filled 2016-08-30 (×4): qty 2
  Filled 2016-08-30: qty 1
  Filled 2016-08-30 (×8): qty 2
  Filled 2016-08-30: qty 1
  Filled 2016-08-30 (×3): qty 2
  Filled 2016-08-30 (×3): qty 1
  Filled 2016-08-30 (×7): qty 2
  Filled 2016-08-30: qty 1
  Filled 2016-08-30: qty 2
  Filled 2016-08-30: qty 1
  Filled 2016-08-30 (×2): qty 2
  Filled 2016-08-30: qty 1
  Filled 2016-08-30 (×8): qty 2
  Filled 2016-08-30 (×2): qty 1
  Filled 2016-08-30: qty 2
  Filled 2016-08-30: qty 1
  Filled 2016-08-30 (×2): qty 2
  Filled 2016-08-30 (×3): qty 1
  Filled 2016-08-30 (×5): qty 2
  Filled 2016-08-30: qty 1

## 2016-08-30 MED ORDER — FAT EMULSION 20 % IV EMUL
250.0000 mL | INTRAVENOUS | Status: AC
  Administered 2016-08-30: 250 mL via INTRAVENOUS
  Filled 2016-08-30: qty 250

## 2016-08-30 NOTE — Progress Notes (Signed)
s/p perforated ulcer and laparotomy x 2 Empyema s/p thoracotomy and decortication s/p placement of two new abd drain, one on anterior abdominal wall draining an interloop collection and another on torso draining perisplenic abscess s/p exchange of chest pigtail WBC stable mild increase but clinically improving  AVSS Tolerating clears JP fistula only 230cc over 24 hrs  PE NAD CHest tube in place Abd: vac in place two drain one w purulent drainage and the other w bile. No peritontiis  A/p Doing well No surgical issues at this time continue TPN and A/Bs Start soft diet w supplements  Added gabapentin for pain Mobilize Fistula improving will advance her diet

## 2016-08-30 NOTE — Progress Notes (Signed)
Nutrition Follow-up  DOCUMENTATION CODES:   Severe malnutrition in context of acute illness/injury  INTERVENTION:  Continue Clinimix E 5/15 @ 83 ml/hr + 20% ILE @ 20 ml/hr over 12 hours. Goal regimen provides 1894 kcal, 99.6 grams of protein, 2472 ml fluid daily.  Continue Ensure Enlive po BID, each supplement provides 350 kcal and 20 grams of protein. Will monitor patient's tolerance.   NUTRITION DIAGNOSIS:   Inadequate oral intake related to poor appetite, acute illness as evidenced by per patient/family report, meal completion < 50%.  Ongoing - addressing with TPN  GOAL:   Patient will meet greater than or equal to 90% of their needs  Met with TPN  MONITOR:   I & O's, Diet advancement, Labs, Weight trends, Supplement acceptance  REASON FOR ASSESSMENT:   Malnutrition Screening Tool    ASSESSMENT:   Lindsey Wolf is a 51 y.o. female with a 3 days hx of abdominal pain in the epigastric area and she thinks it was a gas type. Pain is moderate to severe in intensity and has worsening today.  -Per chart 2 JP drains were removed and a pennrose drain was placed. However, on I/O documentation patient still charted to have two JP drains, two chest tubes, and her negative pressure wound therapy. -Per chart patient may discharge to [facility] pending reauthorization. Would discharge on TPN along with chest tubes and negative pressure wound therapy.  MD ordered Ensure Enlive BID for patient today. She was also advanced to a soft diet. Discussed nutrition plan of care with Dr. Dahlia Byes. Plan is to continue meeting patient's nutritional needs with TPN and will monitor how she tolerates her diet and Ensure.  Spoke with patient at bedside. She reports she has been tolerating sips of water but unable to specify exactly how much PO fluids she is taking in. She reports she is not ordering the clear liquid trays to attempt intake of other clear liquids. Per [facility] she was  delivered two clear liquid trays yesterday (3/28) but completed 0% of these trays. Patient denies any N/V, abdominal pain. Endorses flatus, bowel movements, and that she continues to have good urine output.  Access: triple lumen PICC placed 08/25/2016  TPN: continues to tolerate goal regimen of Clinimix E 5/15 @ 83 ml/hr over 24 hrs + 20% ILE @ 20 ml/hr over 12 hours  Medications reviewed and include: octreotide 100 micrograms Q8hrs, Novolog sliding scale Q6hrs, pantoprazole, Zosyn, Miralax, potassium chloride 40 mEq BID, Dilaudid PRN, oxycodone PRN.  Labs reviewed: CBG 98-132 past 24 hrs, Creatinine 0.38, Anion gap 4. Potassium, Magnesium, Phosphorus WNL.  I/O 0700 3/28 to 0700 3/29: UOP 2 L (1.5 ml/kg/hr), 20 ml output from drains, 0 ml documented output from negative pressure wound therapy  Weight trend: 54.3 kg on 3/29 (stable from last assessment but still overall decrease from admission weight)  Diet Order:  TPN (CLINIMIX-E) Adult .TPN (CLINIMIX-E) Adult TPN (CLINIMIX-E) Adult DIET SOFT Room service appropriate? Yes; Fluid consistency: Thin  Skin:  Wound (see comment) (stage II pressure ulcer in nare)  Last BM:  08/29/2016 - type 7  Height:   Ht Readings from Last 1 Encounters:  08/08/16 _0  (1.778 m)    Weight:   Wt Readings from Last 1 Encounters:  08/30/16 119 lb 12.8 oz (54.3 kg)    Ideal Body Weight:  68.18 kg  BMI:  Body mass index is 17.19 kg/m.  Estimated Nutritional Needs:   Kcal:  1700-1900 (MSJ x 1.3-1.5)  Protein:  85-115 grams (1.5-2 grams/kg)  Fluid:  1.7-1.9 L/day (30-35 ml/kg)  EDUCATION NEEDS:   Education needs addressed (High-Calorie, High-Protein Nutrition Therapy)  Willey Blade, MS, RD, LDN Pager: [phone] After Hours Pager: [phone]

## 2016-08-30 NOTE — Clinical Social Work Note (Signed)
[place] continuing to work on re-auth from [employer]. CSW has provided TPN specific orders to [place] at [facility].  Shela Leff MSW,LCSW [phone]

## 2016-08-30 NOTE — Care Management (Signed)
Attending in agreement to pursue LTACH level of care.  Patient provided choice of LTACH, due to [facility] not being in network with insurance patient has chosen [facility].  Angela Nevin with [facility] has been notified of referral.

## 2016-08-30 NOTE — Progress Notes (Signed)
PT Cancellation Note  Patient Details Name: Lindsey Wolf MRN: [id number] DOB: 1966/03/20   Cancelled Treatment:    Reason Eval/Treat Not Completed: Other (comment). Treatment attempted; pt with OT and nursing staff; re attempt at a later time/date.    Larae Grooms, PTA 08/30/2016, 3:32 PM

## 2016-08-30 NOTE — Progress Notes (Signed)
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  Plan is for patient to be NPO for bowel rest and to re-start TPN. She was started on octreotide.  Patient Measurements: Height: 5\' 10"  (177.8 cm) Weight: 119 lb 12.8 oz (54.3 kg) IBW/kg (Calculated) : 68.5 TPN AdjBW (KG): 59.4 Body mass index is 17.19 kg/m.  Assessment:   GI: Clear liquids started 3/26 Endo:  Insulin requirements in the past 24 hours: 2 units  Lytes: WNL Renal: Pulm: Cards:  Hepatobil: Neuro: ID:  Best Practices: TPN Access: Triple Lumen PICC placed 3/24 TPN start date: 3/24  Nutritional Goals (per RD recommendation on 3/25): kCal: 1700-1900 (MSJ x 1.3-1.5) Protein: 85-115 grams (1.5-2 grams/kg)  Current Nutrition: Clear liquids (3/26)  Plan:   Continue Clinimix E 5/15 @ 83 ml/hr + 20% ILE @ 20 ml/hr over 12 hours. Goal regimen provides 1894 kcal, 99.6 grams of protein, 2472 ml fluid daily. Patient on KCL 46meq PO BID.  Next BMP to be checked Monday  09/03/16. Recheck K+ tomorrow as slowly creeping up. SSI Q6hr.   Pharmacy will continue to monitor and adjust per consult.   Lorell Thibodaux A 08/30/2016,8:26 AM

## 2016-08-30 NOTE — Progress Notes (Signed)
Occupational Therapy Treatment Patient Details Name: Lindsey Wolf MRN: [id number] DOB: 1965-06-12 Today's Date: 08/30/2016    History of present illness Pt is a 51 year old female status post complicated repair of peptic ulcer disease. She later underwent a repeat laparotomy for drainage of intra-abdominal abscesses. Pt diagnosed with Acute hypoxic respiratory failure with L chest tube placement, septic shock with hypotension, and A-fib with RVR.  Pt also has multiple JP drains and was intubated but is now extubated. Pt underwent L thoracotomy and thoracoscopy along with debridement of abdominal wall abscess on 08/08/16.  Pt now with B chest tubes, JP drains, and wound vac. Of note, small pneumothorax per chart review.  Patient underwent wound vac placement and debridement of abd wound 08/12/16. Pt now has several chest tubes draining to foley bag and additional chest tube to wall suction.   OT comments  Pt seen for OT treatment session this date. Pt educated in energy conservation strategies to support functional independence and participation in meaningful activities as well as minimize falls risk. Pt verbalized understanding, appreciative of information. Handout provided for pt to keep. During discussion, pt became tearful stating, "I just can't believe I'm 51yo and I don't know if I'm ever going to get better." OT offered reassurance and encouragement, reviewed pt's progress to date and pt acknowledged progress. Pt having pain, requested pain meds from RN who entered room to assist pt. Continue to progress towards OT goals.   Follow Up Recommendations  Home health OT    Equipment Recommendations       Recommendations for Other Services      Precautions / Restrictions Precautions Precautions: Fall Restrictions Weight Bearing Restrictions: No       Mobility Bed Mobility                  Transfers                      Balance                                            ADL either performed or assessed with clinical judgement   ADL                                               Vision       Perception     Praxis      Cognition                                                Exercises Other Exercises Other Exercises: Pt educated in ECS with handout provided including home/routines modifications to support energy conservation, safety and falls prevention, as well as participation in meaningful activities; pt verbalized understanding, appreciative of information provided   Shoulder Instructions       General Comments      Pertinent Vitals/ Pain          Home Living  Prior Functioning/Environment              Frequency           Progress Toward Goals  OT Goals(current goals can now be found in the care plan section)  Progress towards OT goals: Not progressing toward goals - comment;Progressing toward goals (progressing slowly)  Acute Rehab OT Goals Patient Stated Goal: go home OT Goal Formulation: With patient Potential to Achieve Goals: [place] Frequency remains appropriate;Discharge plan remains appropriate    Co-evaluation                 End of Session    OT Visit Diagnosis: Muscle weakness (generalized) (M62.81);Pain Pain - Right/Left: Left Pain - part of body: Shoulder   Activity Tolerance Patient tolerated treatment well   Patient Left in chair;with call bell/phone within reach;with nursing/sitter in room   Nurse Communication Patient requests pain meds        Time: 9381-8299 OT Time Calculation (min): 15 min  Charges: OT General Charges $OT Visit: 1 Procedure OT Treatments $Self Care/Home Management : 8-22 mins  Jeni Salles, MPH, MS, OTR/L ascom [phone] 08/30/16, 3:46 PM

## 2016-08-31 LAB — GLUCOSE, CAPILLARY
GLUCOSE-CAPILLARY: 118 mg/dL — AB (ref 65–99)
Glucose-Capillary: 117 mg/dL — ABNORMAL HIGH (ref 65–99)
Glucose-Capillary: 126 mg/dL — ABNORMAL HIGH (ref 65–99)

## 2016-08-31 LAB — POTASSIUM: Potassium: 4.5 mmol/L (ref 3.5–5.1)

## 2016-08-31 MED ORDER — FAT EMULSION 20 % IV EMUL
250.0000 mL | INTRAVENOUS | Status: AC
  Administered 2016-08-31: 250 mL via INTRAVENOUS
  Filled 2016-08-31 (×3): qty 250

## 2016-08-31 MED ORDER — TRACE MINERALS CR-CU-MN-SE-ZN 10-1000-500-60 MCG/ML IV SOLN
INTRAVENOUS | Status: AC
  Administered 2016-08-31: 20:00:00 via INTRAVENOUS
  Filled 2016-08-31 (×6): qty 1992

## 2016-08-31 NOTE — Progress Notes (Signed)
qPhysical Therapy Treatment Patient Details Name: GALILEE PIERRON MRN: [id number] DOB: July 27, 1965 Today's Date: 08/31/2016    History of Present Illness Pt is a 51 year old female status post complicated repair of peptic ulcer disease. She later underwent a repeat laparotomy for drainage of intra-abdominal abscesses. Pt diagnosed with Acute hypoxic respiratory failure with L chest tube placement, septic shock with hypotension, and A-fib with RVR.  Pt also has multiple JP drains and was intubated but is now extubated. Pt underwent L thoracotomy and thoracoscopy along with debridement of abdominal wall abscess on 08/08/16.  Pt now with B chest tubes, JP drains, and wound vac. Of note, small pneumothorax per chart review.  Patient underwent wound vac placement and debridement of abd wound 08/12/16. Pt now has several chest tubes draining to foley bag and additional chest tube to wall suction.    PT Comments    Pt reports (and nursing confirms) that she walked 3x around the nurses' station earlier today with the walker and CGA - this would be approximately 450-500 ft.  She reports that she does not feel like doing any further walking today, but that she will continue to be active and walk with nursing over the weekend.  Pt was eager to do some exercises but did not wish to get out of bed/to the recliner.  Overall pt did very well with exercises and was able to do all of them with considerable resistance.      Follow Up Recommendations   (per progress)     Equipment Recommendations  Rolling walker with 5" wheels    Recommendations for Other Services       Precautions / Restrictions Precautions Precautions: Fall Restrictions Weight Bearing Restrictions: No    Mobility  Bed Mobility               General bed mobility comments: Pt deferred any mobility  Transfers                    Ambulation/Gait                 Stairs            Wheelchair Mobility     Modified Rankin (Stroke Patients Only)       Balance                                            Cognition Arousal/Alertness: Awake/alert Behavior During Therapy: WFL for tasks assessed/performed Overall Cognitive Status: Within Functional Limits for tasks assessed                                        Exercises General Exercises - Lower Extremity Ankle Circles/Pumps: Strengthening;10 reps;Both Quad Sets: Strengthening;Both;20 reps;Supine Gluteal Sets: Strengthening;Both;20 reps;Supine Short Arc Quad: Strengthening;10 reps;Both Heel Slides: Strengthening;10 reps;Both Hip ABduction/ADduction: Strengthening;10 reps;Both Straight Leg Raises: Strengthening;10 reps;Both Other Exercises Other Exercises: all exercises with significant manual resistance per pt tolerance    General Comments        Pertinent Vitals/Pain Pain Assessment:  (general mild abdominal pain associated with sx)    Home Living                      Prior Function  PT Goals (current goals can now be found in the care plan section) Progress towards PT goals: Progressing toward goals    Frequency    Min 2X/week      PT Plan Current plan remains appropriate    Co-evaluation             End of Session   Activity Tolerance: Patient tolerated treatment well Patient left: in bed;with call bell/phone within reach;with bed alarm set   PT Visit Diagnosis: Muscle weakness (generalized) (M62.81);Difficulty in walking, not elsewhere classified (R26.2)     Time: 5797-2820 PT Time Calculation (min) (ACUTE ONLY): 15 min  Charges:  $Therapeutic Exercise: 8-22 mins                    G Codes:         Kreg Shropshire, DPT 08/31/2016, 5:03 PM

## 2016-08-31 NOTE — Progress Notes (Signed)
PHARMACY CONSULT NOTE FOR:  OUTPATIENT  PARENTERAL ANTIBIOTIC THERAPY (OPAT)  Indication: Abdominal abscess s/p perforated pepcid ulcer Regimen: Zosyn 3.375gm IV q8h End date: 09/07/16  IV antibiotic discharge orders are pended. To discharging provider:  please sign these orders via discharge navigator,  Select New Orders & click on the button choice - Manage This Unsigned Work.     Thank you for allowing pharmacy to be a part of this patient's care.  Lindsey Wolf A 08/31/2016, 8:09 AM

## 2016-08-31 NOTE — Progress Notes (Signed)
Wound vac blockage alert. Dr. Hampton Abbot notified, instructed to change cannister first and if that does not work, due to have dressing changed by wound care nurse today.

## 2016-08-31 NOTE — Consult Note (Signed)
[place] Nurse wound follow up Reason for Consult: Midline abdominal wound with NPWT.  Penrose drain to LLQ in place.  Wound type:Surgical wound Pressure Injury POA: N/A Measurement: 10 cm x 2 cm x 0.3 cm with undermining at 6 o'clock, extends 1.3 cm some purulence noted here today with dressing change.  Wound YBW:[id number] tissue noted.  Tissue is friable at distal ends, bleeds with cleansing.  Drainage (amount, consistency, odor) Minimal serosanguinous.  No odor  Purulence noted at distal end of wound.  Periwound:Intact.  Drain to LLQ  And left side Chest tube Dressing procedure/placement/frequency:NPWT dressing 1 piece black foam, change MON/WED/FRI WOC team will follow.   Domenic Moras RN BSN [place] Pager [phone]

## 2016-08-31 NOTE — Progress Notes (Signed)
ANTIBIOTIC/ANTIFUnGAL CONSULT NOTE - FOLLOW UP  Pharmacy Consult for Zosyn and Eraxis Indication: intra-abdominal infection  No Known Allergies  Patient Measurements: Height: 5\' 10"  (177.8 cm) Weight: 120 lb 4.8 oz (54.6 kg) IBW/kg (Calculated) : 68.5  Vital Signs: Temp: 98.5 F (36.9 C) (03/30 0415) Temp Source: Oral (03/30 0415) BP: 114/76 (03/30 0415) Pulse Rate: 77 (03/30 0415) Intake/Output from previous day: 03/29 0701 - 03/30 0700 In: 1081 [I.V.:1039; IV Piggyback:42] Out: 2235 [Urine:2050; Drains:100; Chest Tube:85] Intake/Output from this shift: No intake/output data recorded.  Labs:  Recent Labs  08/30/16 0421  WBC 16.7*  HGB 10.7*  PLT 690*  CREATININE 0.38*   Estimated Creatinine Clearance: 73.3 mL/min (A) (by C-G formula based on SCr of 0.38 mg/dL (L)).  Assessment: Pharmacy consulted to dose piperacillin/tazobactam and Eraxis in this 50 year old female with intrabdominal infection post perforated peptic ulcer. * ID following, see ID Notes as per 3/28- plan several more weeks of IV Zosyn, plan switch to po fluconazole at discharge.  Plan:  Continue Zosyn EI 3.375 g IV q8 hours and Eraxis (anidulafungin)   100 mg IV q24 hours.   Noralee Space, PharmD Clinical Pharmacist 08/31/2016,8:07 AM

## 2016-08-31 NOTE — Progress Notes (Addendum)
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  Plan is for patient to be NPO for bowel rest and to re-start TPN. She was started on octreotide.  Patient Measurements: Height: 5\' 10"  (177.8 cm) Weight: 120 lb 4.8 oz (54.6 kg) IBW/kg (Calculated) : 68.5 TPN AdjBW (KG): 59.4 Body mass index is 17.26 kg/m.  Assessment:   GI: Soft Diet started 3/29- just taking bites, also has Ensure BID Endo:  Insulin requirements in the past 24 hours: 2 units SSI q6h  Lytes: next BMP on Monday 09/03/16, but watch K+ since on PO K+ 3/30: K+=4.5 Renal: Pulm: Cards:  Hepatobil: Neuro: ID:  Best Practices: TPN Access: Triple Lumen PICC placed 3/24 TPN start date: 3/24  Nutritional Goals (per RD recommendation on 3/25): kCal: 1700-1900 (MSJ x 1.3-1.5) Protein: 85-115 grams (1.5-2 grams/kg)  Current Nutrition: Soft Diet 3/29, Ensure BID  Plan:   Continue Clinimix E 5/15 @ 83 ml/hr + 20% ILE @ 20 ml/hr over 12 hours. Goal regimen provides 1894 kcal, 99.6 grams of protein, 2472 ml fluid daily. SSI Q6hr.  Patient on KCL 47meq PO BID. 3/30 K+=4.5 Next BMP to be checked Monday 09/03/16.    Pharmacy will continue to monitor and adjust per consult.   Sharniece Gibbon A 08/31/2016,8:19 AM

## 2016-08-31 NOTE — Progress Notes (Signed)
Patient ID: Lindsey Wolf, female   DOB: 06-04-1966, 51 y.o.   MRN: [id number] Patient states that she has a starting to feel better in a slow fashion. She certainly looks much better than she did a few weeks ago when I last saw her. Currently on a soft diet which she is tolerating but she states her appetite is extremely poor. She's afebrile and vital signs are stable Abdomen is soft bowel sounds are present. Wound VAC is intact Abdominal catheterization of both the intact and functioning. The multiple chest tubes were also functioning well. Labs yesterday showed a white count mildly elevated at 16,000 which was previously 13,000. Serum albumin is slowly rising up to 1.9 from 1.7 a few days ago. Overall stable. Awaiting placement in a long-term acute care

## 2016-08-31 NOTE — Care Management (Signed)
LTACH pending insurance auth

## 2016-08-31 NOTE — Progress Notes (Signed)
OT Cancellation Note  Patient Details Name: Lindsey Wolf MRN: [id number] DOB: August 17, 1965   Cancelled Treatment:    Reason Eval/Treat Not Completed: Patient declined, no reason specified. Pt. Was eating crackers, and peanut butter. Pt. Reports she has a poor appetite, and is trying improve it. Attempted to follow-up with pt. to review energy conservation, and  work simplification strategies for ADLs. Pt. Reports she looked at the handouts that were provided to her on a previous treatment day. Pt. Reports not having any questions at this time, and pleasantly declines the need to review ADLs, A/E, or energy conservation techniques at this time. Will continue to monitor, and intervene as appropriate.  Harrel Carina, MS, OTR/L 08/31/2016, 2:48 PM

## 2016-09-01 LAB — GLUCOSE, CAPILLARY
GLUCOSE-CAPILLARY: 132 mg/dL — AB (ref 65–99)
GLUCOSE-CAPILLARY: 96 mg/dL (ref 65–99)
Glucose-Capillary: 109 mg/dL — ABNORMAL HIGH (ref 65–99)
Glucose-Capillary: 112 mg/dL — ABNORMAL HIGH (ref 65–99)
Glucose-Capillary: 138 mg/dL — ABNORMAL HIGH (ref 65–99)

## 2016-09-01 MED ORDER — TRACE MINERALS CR-CU-MN-SE-ZN 10-1000-500-60 MCG/ML IV SOLN
INTRAVENOUS | Status: AC
  Administered 2016-09-01: 18:00:00 via INTRAVENOUS
  Filled 2016-09-01: qty 1992

## 2016-09-01 MED ORDER — FAT EMULSION 20 % IV EMUL
250.0000 mL | INTRAVENOUS | Status: AC
  Administered 2016-09-01: 250 mL via INTRAVENOUS
  Filled 2016-09-01: qty 250

## 2016-09-01 NOTE — Progress Notes (Signed)
Patient ID: Lindsey Wolf, female   DOB: 06/01/1966, 51 y.o.   MRN: [id number] Patient states she is feeling better in general and actually has better appetite today. Afebrile and vital signs are stable Drains and catheters are all intact and functioning with slowly decreasing amounts. Stable condition

## 2016-09-01 NOTE — Progress Notes (Signed)
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  Plan is for patient to be NPO for bowel rest and to re-start TPN. She was started on octreotide.  Patient Measurements: Height: 5\' 10"  (177.8 cm) Weight: 111 lb 11.2 oz (50.7 kg) IBW/kg (Calculated) : 68.5 TPN AdjBW (KG): 59.4 Body mass index is 16.03 kg/m.  Assessment:   GI: Soft Diet started 3/29- just taking bites, also has Ensure BID Endo:  Insulin requirements in the past 24 hours: 1 unit SSI q6h  Lytes: next BMP on Monday 09/03/16, but watch K+ since on PO K+ 3/30: K+=4.5 Renal: Pulm: Cards:  Hepatobil: Neuro: ID:  Best Practices: TPN Access: Triple Lumen PICC placed 3/24 TPN start date: 3/24  Nutritional Goals (per RD recommendation on 3/25): kCal: 1700-1900 (MSJ x 1.3-1.5) Protein: 85-115 grams (1.5-2 grams/kg)  Current Nutrition: Soft Diet 3/29, Ensure BID  Plan:   Continue Clinimix E 5/15 @ 83 ml/hr + 20% ILE @ 20 ml/hr over 12 hours. Goal regimen provides 1894 kcal, 99.6 grams of protein, 2472 ml fluid daily. SSI Q6hr.  Patient on KCL 67meq PO BID. 3/30 K+=4.5 Next BMP to be checked Monday 09/03/16.  Pharmacy will continue to monitor and adjust per consult.   Ulice Dash D 09/01/2016,9:29 AM

## 2016-09-02 LAB — GLUCOSE, CAPILLARY
GLUCOSE-CAPILLARY: 109 mg/dL — AB (ref 65–99)
GLUCOSE-CAPILLARY: 128 mg/dL — AB (ref 65–99)
Glucose-Capillary: 112 mg/dL — ABNORMAL HIGH (ref 65–99)
Glucose-Capillary: 97 mg/dL (ref 65–99)

## 2016-09-02 MED ORDER — TRACE MINERALS CR-CU-MN-SE-ZN 10-1000-500-60 MCG/ML IV SOLN
INTRAVENOUS | Status: AC
  Administered 2016-09-02: 18:00:00 via INTRAVENOUS
  Filled 2016-09-02: qty 1992

## 2016-09-02 MED ORDER — FAT EMULSION 20 % IV EMUL
250.0000 mL | INTRAVENOUS | Status: AC
  Administered 2016-09-02: 250 mL via INTRAVENOUS
  Filled 2016-09-02: qty 250

## 2016-09-02 NOTE — Progress Notes (Signed)
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  Plan is for patient to be NPO for bowel rest and to re-start TPN. She was started on octreotide.  Patient Measurements: Height: 5\' 10"  (177.8 cm) Weight: 112 lb 4.8 oz (50.9 kg) IBW/kg (Calculated) : 68.5 TPN AdjBW (KG): 59.4 Body mass index is 16.11 kg/m.  Assessment:   GI: Soft Diet started 3/29 Endo:  Insulin requirements in the past 24 hours: 1 unit SSI q6h  Lytes: next BMP on Monday 09/03/16 Renal: Pulm: Cards:  Hepatobil: Neuro: ID:  Best Practices: TPN Access: Triple Lumen PICC placed 3/24 TPN start date: 3/24  Nutritional Goals (per RD recommendation on 3/25): kCal: 1700-1900 (MSJ x 1.3-1.5) Protein: 85-115 grams (1.5-2 grams/kg)  Current Nutrition: Soft Diet 3/29, Ensure BID  Plan:   Continue Clinimix E 5/15 @ 83 ml/hr + 20% ILE @ 20 ml/hr over 12 hours. Goal regimen provides 1894 kcal, 99.6 grams of protein, 2472 ml fluid daily. SSI Q6hr.  Next BMP to be checked Monday 09/03/16.  Pharmacy will continue to monitor and adjust per consult.   Ulice Dash D 09/02/2016,9:22 AM

## 2016-09-02 NOTE — Progress Notes (Signed)
21 Days Post-Op  Subjective: Patient feels well and is tolerating a soft diet but she admits that she is not taking 100% of her calories. She is still on TPN.  Objective: Vital signs in last 24 hours: Temp:  [97.5 F (36.4 C)-98.3 F (36.8 C)] 97.5 F (36.4 C) (04/01 0532) Pulse Rate:  [73-80] 76 (04/01 0532) Resp:  [20-24] 20 (04/01 0532) BP: (107-121)/(59-77) 121/71 (04/01 0532) SpO2:  [97 %-100 %] 97 % (04/01 0532) Weight:  [112 lb 4.8 oz (50.9 kg)] 112 lb 4.8 oz (50.9 kg) (04/01 0500) Last BM Date: 09/01/16  Intake/Output from previous day: 03/31 0701 - 04/01 0700 In: 1298.5 [I.V.:1098.5; IV Piggyback:200] Out: 2214.5 [Urine:2150; Drains:9.5; Chest Tube:55] Intake/Output this shift: No intake/output data recorded.  Physical exam:  Very minimal bilious output in the JP drain wound VAC is in place  Lab Results: CBC  No results for input(s): WBC, HGB, HCT, PLT in the last 72 hours. BMET  Recent Labs  08/31/16 1118  K 4.5   PT/INR No results for input(s): LABPROT, INR in the last 72 hours. ABG No results for input(s): PHART, HCO3 in the last 72 hours.  Invalid input(s): PCO2, PO2  Studies/Results: No results found.  Anti-infectives: Anti-infectives    Start     Dose/Rate Route Frequency Ordered Stop   08/16/16 1500  anidulafungin (ERAXIS) 100 mg in sodium chloride 0.9 % 100 mL IVPB     100 mg over 90 Minutes Intravenous Every 24 hours 08/15/16 1428     08/15/16 1500  anidulafungin (ERAXIS) 200 mg in sodium chloride 0.9 % 200 mL IVPB     200 mg over 180 Minutes Intravenous  Once 08/15/16 1428 08/15/16 1822   08/15/16 1430  piperacillin-tazobactam (ZOSYN) IVPB 3.375 g     3.375 g 12.5 mL/hr over 240 Minutes Intravenous Every 8 hours 08/15/16 1428     08/15/16 0730  fluconazole (DIFLUCAN) IVPB 400 mg  Status:  Discontinued     400 mg 100 mL/hr over 120 Minutes Intravenous Every 24 hours 08/15/16 0712 08/15/16 1402   08/11/16 0945  ertapenem (INVANZ) 1 g in  sodium chloride 0.9 % 50 mL IVPB  Status:  Discontinued     1 g 100 mL/hr over 30 Minutes Intravenous Every 24 hours 08/11/16 0939 08/15/16 1415   08/05/16 1500  vancomycin (VANCOCIN) IVPB 1000 mg/200 mL premix  Status:  Discontinued     1,000 mg 200 mL/hr over 60 Minutes Intravenous Every 12 hours 08/05/16 0753 08/06/16 0850   08/05/16 0800  vancomycin (VANCOCIN) IVPB 1000 mg/200 mL premix     1,000 mg 200 mL/hr over 60 Minutes Intravenous  Once 08/05/16 0753 08/05/16 0923   07/30/16 1815  fluconazole (DIFLUCAN) IVPB 400 mg  Status:  Discontinued     400 mg 100 mL/hr over 120 Minutes Intravenous Every 24 hours 07/30/16 1743 08/13/16 1358   07/30/16 1528  piperacillin-tazobactam (ZOSYN) 3.375 GM/50ML IVPB    Comments:  LEWIS, CINDY: cabinet override      07/30/16 1528 07/30/16 1525   07/21/16 0000  piperacillin-tazobactam (ZOSYN) IVPB 3.375 g  Status:  Discontinued     3.375 g 12.5 mL/hr over 240 Minutes Intravenous Every 8 hours 07/20/16 1809 08/11/16 0939   07/20/16 1430  piperacillin-tazobactam (ZOSYN) IVPB 3.375 g     3.375 g 100 mL/hr over 30 Minutes Intravenous  Once 07/20/16 1424 07/20/16 1603      Assessment/Plan: s/p Procedure(s): ,remove 2  JP drains,add one pennrose  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 Patient is tolerating a diet but not taking full amounts. I would recommend continuing TPN at this point but we can talk to nutrition tomorrow about potentially decreasing the rate and volume of the TPN. She is taking a soft diet at this point. 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  Florene Glen, MD, FACS  09/02/2016

## 2016-09-03 ENCOUNTER — Inpatient Hospital Stay: Payer: BLUE CROSS/BLUE SHIELD

## 2016-09-03 LAB — COMPREHENSIVE METABOLIC PANEL WITH GFR
ALT: 14 U/L (ref 14–54)
AST: 25 U/L (ref 15–41)
Albumin: 2.2 g/dL — ABNORMAL LOW (ref 3.5–5.0)
Alkaline Phosphatase: 190 U/L — ABNORMAL HIGH (ref 38–126)
Anion gap: 6 (ref 5–15)
BUN: 13 mg/dL (ref 6–20)
CO2: 28 mmol/L (ref 22–32)
Calcium: 8.7 mg/dL — ABNORMAL LOW (ref 8.9–10.3)
Chloride: 105 mmol/L (ref 101–111)
Creatinine, Ser: 0.45 mg/dL (ref 0.44–1.00)
GFR calc Af Amer: >60 mL/min
GFR calc non Af Amer: >60 mL/min
Glucose, Bld: 120 mg/dL — ABNORMAL HIGH (ref 65–99)
Potassium: 4.6 mmol/L (ref 3.5–5.1)
Sodium: 139 mmol/L (ref 135–145)
Total Bilirubin: 0.3 mg/dL (ref 0.3–1.2)
Total Protein: 5.9 g/dL — ABNORMAL LOW (ref 6.5–8.1)

## 2016-09-03 LAB — CBC WITH DIFFERENTIAL/PLATELET
Basophils Absolute: 0.1 K/uL (ref 0–0.1)
Basophils Relative: 1 %
Eosinophils Absolute: 0.6 K/uL (ref 0–0.7)
Eosinophils Relative: 6 %
HCT: 33.6 % — ABNORMAL LOW (ref 35.0–47.0)
Hemoglobin: 10.6 g/dL — ABNORMAL LOW (ref 12.0–16.0)
Lymphocytes Relative: 8 %
Lymphs Abs: 0.9 K/uL — ABNORMAL LOW (ref 1.0–3.6)
MCH: 26.2 pg (ref 26.0–34.0)
MCHC: 31.6 g/dL — ABNORMAL LOW (ref 32.0–36.0)
MCV: 83 fL (ref 80.0–100.0)
Monocytes Absolute: 0.8 K/uL (ref 0.2–0.9)
Monocytes Relative: 8 %
Neutro Abs: 8 K/uL — ABNORMAL HIGH (ref 1.4–6.5)
Neutrophils Relative %: 77 %
Platelets: 754 K/uL — ABNORMAL HIGH (ref 150–440)
RBC: 4.05 MIL/uL (ref 3.80–5.20)
RDW: 22.7 % — ABNORMAL HIGH (ref 11.5–14.5)
WBC: 10.4 K/uL (ref 3.6–11.0)

## 2016-09-03 LAB — TRIGLYCERIDES: TRIGLYCERIDES: 111 mg/dL (ref <150)

## 2016-09-03 LAB — GLUCOSE, CAPILLARY
GLUCOSE-CAPILLARY: 131 mg/dL — AB (ref 65–99)
Glucose-Capillary: 121 mg/dL — ABNORMAL HIGH (ref 65–99)
Glucose-Capillary: 116 mg/dL — ABNORMAL HIGH (ref 65–99)
Glucose-Capillary: 102 mg/dL — ABNORMAL HIGH (ref 65–99)

## 2016-09-03 LAB — PHOSPHORUS: Phosphorus: 4.7 mg/dL — ABNORMAL HIGH (ref 2.5–4.6)

## 2016-09-03 LAB — PREALBUMIN: Prealbumin: 17.7 mg/dL — ABNORMAL LOW (ref 18–38)

## 2016-09-03 LAB — MAGNESIUM: MAGNESIUM: 1.9 mg/dL (ref 1.7–2.4)

## 2016-09-03 MED ORDER — GABAPENTIN 600 MG PO TABS: 300.0000 mg | ORAL_TABLET | Freq: Three times a day (TID) | ORAL | 0 refills | Status: DC

## 2016-09-03 MED ORDER — HYDROMORPHONE HCL 1 MG/ML IJ SOLN: 0.5000 mg | INTRAMUSCULAR | 0 refills | Status: DC | PRN

## 2016-09-03 MED ORDER — SODIUM CHLORIDE 0.9 % IV SOLN: 100.0000 mg | INTRAVENOUS | 0 refills | Status: DC

## 2016-09-03 MED ORDER — POLYETHYLENE GLYCOL 3350 17 G PO PACK: 17.0000 g | PACK | Freq: Every day | ORAL | 0 refills | Status: DC

## 2016-09-03 MED ORDER — BOOST / RESOURCE BREEZE PO LIQD
1.0000 | Freq: Two times a day (BID) | ORAL | Status: DC
  Administered 2016-09-05: 1 via ORAL

## 2016-09-03 MED ORDER — PIPERACILLIN-TAZOBACTAM 3.375 G IVPB: 3.3750 g | Freq: Three times a day (TID) | INTRAVENOUS | 0 refills | Status: AC

## 2016-09-03 MED ORDER — BISACODYL 10 MG RE SUPP
10.0000 mg | Freq: Every day | RECTAL | 0 refills | Status: DC | PRN
Start: 2016-09-03 — End: 2016-09-18

## 2016-09-03 MED ORDER — PANTOPRAZOLE SODIUM 40 MG IV SOLR: 40.0000 mg | Freq: Two times a day (BID) | INTRAVENOUS | 0 refills | Status: DC

## 2016-09-03 MED ORDER — NYSTATIN 100000 UNIT/ML MT SUSP: 5.0000 mL | Freq: Four times a day (QID) | OROMUCOSAL | 0 refills | Status: DC

## 2016-09-03 MED ORDER — ONDANSETRON 4 MG PO TBDP: 4.0000 mg | ORAL_TABLET | Freq: Four times a day (QID) | ORAL | 0 refills | Status: DC | PRN

## 2016-09-03 MED ORDER — FLUOXETINE HCL 40 MG PO CAPS: 40.0000 mg | ORAL_CAPSULE | Freq: Every day | ORAL | 3 refills | Status: DC

## 2016-09-03 MED ORDER — POTASSIUM CHLORIDE CRYS ER 20 MEQ PO TBCR: 20.0000 meq | EXTENDED_RELEASE_TABLET | Freq: Two times a day (BID) | ORAL | 0 refills | Status: DC

## 2016-09-03 MED ORDER — ONDANSETRON HCL 4 MG/2ML IJ SOLN: 4.0000 mg | Freq: Four times a day (QID) | INTRAMUSCULAR | 0 refills | Status: DC | PRN

## 2016-09-03 MED ORDER — HYDROMORPHONE HCL 2 MG PO TABS: 2.0000 mg | ORAL_TABLET | ORAL | 0 refills | Status: DC | PRN

## 2016-09-03 MED ORDER — ACETAMINOPHEN 500 MG PO TABS: 1000.0000 mg | ORAL_TABLET | Freq: Four times a day (QID) | ORAL | 0 refills | Status: DC | PRN

## 2016-09-03 MED ORDER — ORAL CARE MOUTH RINSE: 15.0000 mL | Freq: Two times a day (BID) | OROMUCOSAL | 0 refills | Status: DC

## 2016-09-03 MED ORDER — LORAZEPAM 0.5 MG PO TABS: 0.5000 mg | ORAL_TABLET | Freq: Four times a day (QID) | ORAL | 0 refills | Status: DC | PRN

## 2016-09-03 MED ORDER — FAT EMULSION 20 % IV EMUL
250.0000 mL | INTRAVENOUS | Status: AC
  Administered 2016-09-03: 250 mL via INTRAVENOUS
  Filled 2016-09-03: qty 250

## 2016-09-03 MED ORDER — PREMIER PROTEIN SHAKE
11.0000 [oz_av] | ORAL | Status: DC
  Administered 2016-09-05: 11 [oz_av] via ORAL

## 2016-09-03 MED ORDER — ENOXAPARIN SODIUM 40 MG/0.4ML ~~LOC~~ SOLN: 40.0000 mg | SUBCUTANEOUS | 0 refills | Status: DC

## 2016-09-03 MED ORDER — HYDROMORPHONE HCL 2 MG PO TABS
2.0000 mg | ORAL_TABLET | ORAL | Status: DC | PRN
Start: 2016-09-03 — End: 2016-09-14
  Administered 2016-09-03 – 2016-09-13 (×20): 2 mg via ORAL
  Filled 2016-09-03 (×20): qty 1

## 2016-09-03 MED ORDER — BOOST / RESOURCE BREEZE PO LIQD: 1.0000 | Freq: Two times a day (BID) | ORAL | 0 refills | Status: DC

## 2016-09-03 MED ORDER — TRACE MINERALS CR-CU-MN-SE-ZN 10-1000-500-60 MCG/ML IV SOLN
INTRAVENOUS | Status: AC
  Administered 2016-09-03: 18:00:00 via INTRAVENOUS
  Filled 2016-09-03: qty 1992

## 2016-09-03 MED ORDER — IPRATROPIUM-ALBUTEROL 0.5-2.5 (3) MG/3ML IN SOLN: 3.0000 mL | RESPIRATORY_TRACT | 0 refills | Status: DC | PRN

## 2016-09-03 NOTE — Progress Notes (Signed)
Notified Dr. Genevive Bi about patient complaint of chest pain following ambulation with physical therapy. Acknowledged, new order placed. Continue to assess.

## 2016-09-03 NOTE — Progress Notes (Signed)
Nutrition Follow-up  DOCUMENTATION CODES:   Severe malnutrition in context of acute illness/injury  INTERVENTION:  Continue Clinimix E 5/15 @ 83 ml/hr + 20% ILE @ 20 ml/hr over 12 hours. Goal regimen provides 1894 kcal, 99.6 grams of protein, 2472 ml fluid daily.  Calorie count per MD.  Discussed with MD and okay to advance patient to regular diet. She is requesting items not available on soft diet.  Discontinue Ensure Enlive.  Provide Boost Breeze po BID, each supplement provides 250 kcal and 9 grams of protein. Patient prefers orange flavor.  Provide Premeir Protein po once daily, each supplement provides 160 kcal and 30 grams of protein. Patient prefers vanilla.  NUTRITION DIAGNOSIS:   Inadequate oral intake related to poor appetite, acute illness as evidenced by per patient/family report, meal completion < 50%.  Improving - appetite is improving and patient now eating >50% of meals.  GOAL:   Patient will meet greater than or equal to 90% of their needs  Progressing.  MONITOR:   I & O's, Diet advancement, Labs, Weight trends, Supplement acceptance  REASON FOR ASSESSMENT:   Malnutrition Screening Tool    ASSESSMENT:   Lindsey Wolf is a 51 y.o. female with a 3 days hx of abdominal pain in the epigastric area and she thinks it was a gas type. Pain is moderate to severe in intensity and has worsening today.  Spoke with patient at bedside. She reports her appetite has been improving. She had 100% of her breakfast yesterday (2 pieces french toast, banana, sweet tea), 100% of lunch yesterday (egg salad sandwich, grape juice), and most of dinner last night (100% of hamburger with mustard/mayo, bites of banana pudding). This is approximately 1359 kcal (80% minimum estimated kcal needs) and 43 grams of protein (51% minimum estimated protein needs). Initiated calorie count per MD so we can attempt to wean TPN. Patient reports she would like to have certain things such as  oranges and bacon that are not allowed on her soft diet. She is also tired of drinking the Ensure and has been refusing them. After reviewing all of the other oral nutrition supplement options available, patient would like to switch to vanilla Premier Protein and orange Boost Breeze.  Access: PICC triple lumen placed 08/25/2016  TPN: continues to tolerate goal regimen of Clinimix E 5/15 @ 83 ml/hr over 24 hrs + 20% ILE @ 20 ml/hr over 12 hours  Medications reviewed and include: octreotide 100 micrograms Q8hrs, Novolog sliding scale Q6hrs (received 1 unit past 24 hrs), Medline mouth rinse, pantoprazole, Zosyn, Miralax, potassium chloride 40 mEq BID, Dilaudid PRN.  Labs reviewed: CBG 109-128 past 24 hrs, Phosphorus 4.7 (trending up from 4.1 on 3/29). Potassium and Magnesium WNL. Triglycerides 111.  I/O: no output from drains or wound VAC past 24 hrs  Weight trend: 53 kg 4/2 (-1.3 kg from last assessment)  Discussed with RN.   Diet Order:  DIET SOFT Room service appropriate? Yes; Fluid consistency: Thin .TPN (CLINIMIX-E) Adult .TPN (CLINIMIX-E) Adult  Skin:  Wound (see comment) (stage II pressure ulcer in nare)  Last BM:  08/29/2016 - type 7  Height:   Ht Readings from Last 1 Encounters:  08/08/16 5\' 10"  (1.778 m)    Weight:   Wt Readings from Last 1 Encounters:  09/03/16 116 lb 12.8 oz (53 kg)    Ideal Body Weight:  68.18 kg  BMI:  Body mass index is 16.76 kg/m.  Estimated Nutritional Needs:   Kcal:  1700-1900 (MSJ  x 1.3-1.5)  Protein:  85-115 grams (1.5-2 grams/kg)  Fluid:  1.7-1.9 L/day (30-35 ml/kg)  EDUCATION NEEDS:   Education needs addressed (High-Calorie, High-Protein Nutrition Therapy)  Willey Blade, MS, RD, LDN Pager: [phone] After Hours Pager: [phone]

## 2016-09-03 NOTE — Progress Notes (Signed)
This RN notified Pabon, MD about patient DG results from today, acknowledged. Pt resting in bed. Continue to assess.

## 2016-09-03 NOTE — Discharge Summary (Addendum)
Patient ID: Lindsey Wolf MRN: [id number] DOB/AGE: 1966-03-20 51 y.o.  Admit date: 07/20/2016 Discharge date:    Discharge Diagnoses:  Active Problems:   Perforated ulcer ([facility])   Abdominal abscess ([facility])   Abnormal respirations   Pleural effusion   Respiratory distress   Empyema of left pleural space ([facility])   Protein-calorie malnutrition, severe   Procedures: 07/20/16 laparotomy and repair of 2 ulcers peptic using a Graham patch and a falciform patch, placement of two blake drains  07/25/16 Ct guided percutaneous Drain placement of 12 FR tube RUQ abscess  07/30/16 Laparotomy with drainage of abscess and placement of blake drains for intra-abdominal abscess not amenable to percutaneous drainage.  08/08/16 Left thoracotomy and decortication for Left empyema. Debridement of abdominal wound and VAC placement  08/12/16 Wound vac placement with debridement of abdominal wound and I/D abscess  08/17/16 Ct guided catheter placement Left pleural collection  08/23/16 Ct guided drainage of perisplenic abscess w drain placement  08/23/16 Ct guided draiange of intra-abdominal abscess (In retrospect probably a loop of bowel)  08/23/16 Ct guided Exchange and Up sizing of left chest tube  MICRO/Cultures Chest Bacteroides fragilis, clostridium and candida Abd Wound E coli and actinomyces Candida albicans  Hospital Course:  51 year old female with a history of acute abdominal pain presented to the emergency room with an acute abdomen and signs of sepsis. CT scan showing extensive free air and thickening around the gastric and duodenum consistent with perforated ulcer. She was taken emergently for exploratory laparotomy where purulent peritonitis was encountered and 2 ulcers 1 in the pyloric area and one in the antrum were encountered. 2 Blake drains were placed to remain in the abdomen. Patient was transferred to the floor and she did have an initial good recovery for the first 3 days. Given the  patient had a complex surgery with purulent peritonitis with in situ study her abdomen in the form of an upper GI series before removing the NG. The upper GI series show actually leakage of contrast outside the stomach and we decided to continue the NG tube for a few more days. Her white count continued to rise and she was complaining of a more abdominal pain this prompted a CT scan of the abdomen and pelvis showing and again some extraluminal leaking of contrast and multiple fluid collections consistent with abscesses. Interventional radiology was able to A collection near the liver but the other 2 collection in the perisplenic area and in the lesser sac were not accessible due to potential bowel injury. And she was Obviously broad-spectrum antibiotics and did okay for the next couple of days until she again deteriorated having some respiratory failure tachypnea as significant left pleural effusion. She was transferred to the ICU and chest was placed by the intensivist. A repeat CT scan show worsening intra-abdominal collections that were not amenable to percutaneous intervention. For this reason she was taken to the operating room again for exploratory laparotomy. At that time and Dr. Clydene Laming in place methylene blue and showing no signs of any leak intraoperatively. He place multiple drains for adequate drainage of the abdomen. In the first postoperative day after the second operation she started having some leakage of methylene blue within the drainage tubes however she clinically was improving. Given the fact that the operation would probably potentially do more harm than good and we decided to treat this conservatively with drainage and broad-spectrum antibiotics. In the early hours of February 27 her condition worsen with an acute respiratory distress  and tachycardia she was transferred to the ICU and had to be intubated. Further imaging revealed evidence of a large empyema on the left chest. She underwent a  left thoracotomy with decortication on 08/08/2016 as well as a right chest tube placement for a simple effusion. After her thoracic cavity was widely drained her condition improved significantly and she was able to be extubated. She did have a prolonged acute hospitalization secondary to sepsis, malnutrition and systemic inflammatory response syndrome. She underwent a wound VAC placement and debridement of abdominal wound secondary to some necrotic fascia. And then she continued to improve from an abdominal perspective. Around March 20 white count continued to increase to greater than 30,000 and we decided to rescan her chest abdomen and pelvis. There was a larger perisplenic collection as well as a new undrained collection on the chest. There were 2 and abdominal abscesses and as well. We asked interventional radiology to drain and 2 AB size chest tube. The chest tube was Sized without problems and the perisplenic abscess was drained very nicely. Unfortunately be anterior abdominal collection that initially was thought to be an abscess was probably at segment of bowel because after the drain was placed she started putting out bile from this JP. She then was treated back to nothing by mouth on TPN as well as octreotide for a few days and she did well. We finally were able to control her sepsis and her white count normalized with evidence of improvement of her fistula that was control into the JP. At the time of transfer and the patient was doing well she was walking with assistance. She was tolerating diet and having bowel movements. Her physical exam shows a female in no acute distress awake alert she is malnourished. Chest is clear to auscultation bilaterally reveals 3 empyema tubes that are covered with a gauze and there is a pigtail in the left chest that is going to a Heimlich valve. Abdomen shows evidence of a wound VAC that is in place and last time it was changed there was good granulation tissue. No  evidence of infection. She does have 2 more drains one that is anterior that is draining minimal bilious output and the other one that is in the posterior torso draining the perisplenic collection with some seropurulent material.  The plan for the LTAC is to continue antibiotics and antifungals in this form of Zosyn and anidulafungin 2 more weeks until April 17. She will continue her empyema tubes as well as the left pleural tube that is connected to a Heimlich valve. She did have evidence of some loss of space within the left thorax , radiology interpreted asa tiny apical pneumothorax without any symptoms or respiratory repercussions. She will need to continue her TPN because her enteral intake still not quite enough to meet her nutritional requirements. We will hope that in the future this will change and we'll be able to wean her off the TPN. As far as abdominal drainage I'll recommend to keep the anterior abdominal wall JP until no biliary output is coming out. I'll probably do the same for the perisplenic abscess JP.  We'll be happy to provide any further surgical care if needed and she will have follow-up appointments with an [facility] for.both abdominal and thoracic issues.  Vision the time of discharge is stable. Please note that Dr. Jamal Collin is covering for our practice and he will be the actual physician who will give the final approval for her discharge  Consults:  CT surgery, ID. critical care  Disposition: LTAC  Discharge Instructions    Call MD for:  difficulty breathing, headache or visual disturbances    Complete by:  As directed    Call MD for:  extreme fatigue    Complete by:  As directed    Call MD for:  hives    Complete by:  As directed    Call MD for:  persistant dizziness or light-headedness    Complete by:  As directed    Call MD for:  persistant nausea and vomiting    Complete by:  As directed    Call MD for:  redness, tenderness, or signs of infection (pain,  swelling, redness, odor or green/yellow discharge around incision site)    Complete by:  As directed    Call MD for:  severe uncontrolled pain    Complete by:  As directed    Call MD for:  temperature >100.4    Complete by:  As directed    Diet - low sodium heart healthy    Complete by:  As directed    Discharge wound care:    Complete by:  As directed    Abd wound vac changed M,W,F. Empty and measure JP drains BID.  Change Chest dressing BID. PT has three empyema tubes. Keep heimlich valve in place.   Increase activity slowly    Complete by:  As directed      Allergies as of 09/03/2016   No Known Allergies     Medication List    TAKE these medications   acetaminophen 500 MG tablet Commonly known as:  TYLENOL Take 2 tablets (1,000 mg total) by mouth every 6 (six) hours as needed for mild pain.   ALPRAZolam 0.25 MG tablet Commonly known as:  XANAX Take 0.25 mg by mouth 3 (three) times daily as needed.   anidulafungin 100 mg in sodium chloride 0.9 % 100 mL Inject 100 mg into the vein daily. Start taking on:  09/04/2016   bisacodyl 10 MG suppository Commonly known as:  DULCOLAX Place 1 suppository (10 mg total) rectally daily as needed for moderate constipation.   enoxaparin 40 MG/0.4ML injection Commonly known as:  LOVENOX Inject 0.4 mLs (40 mg total) into the skin daily. Start taking on:  09/04/2016   feeding supplement Liqd Take 1 Container by mouth 2 (two) times daily between meals. Start taking on:  09/04/2016   FLUoxetine 20 MG capsule Commonly known as:  PROZAC Take 40 mg by mouth every morning. What changed:  Another medication with the same name was added. Make sure you understand how and when to take each.   FLUoxetine 40 MG capsule Commonly known as:  PROZAC Take 1 capsule (40 mg total) by mouth daily. What changed:  You were already taking a medication with the same name, and this prescription was added. Make sure you understand how and when to take each.    gabapentin 600 MG tablet Commonly known as:  NEURONTIN Take 0.5 tablets (300 mg total) by mouth 3 (three) times daily.   HYDROmorphone 1 MG/ML injection Commonly known as:  DILAUDID Inject 0.5 mLs (0.5 mg total) into the vein every 4 (four) hours as needed for severe pain.   HYDROmorphone 2 MG tablet Commonly known as:  DILAUDID Take 1 tablet (2 mg total) by mouth every 3 (three) hours as needed for moderate pain or severe pain.   ipratropium-albuterol 0.5-2.5 (3) MG/3ML Soln Commonly known as:  DUONEB Take 3 mLs  by nebulization every 4 (four) hours as needed.   LORazepam 0.5 MG tablet Commonly known as:  ATIVAN Take 1 tablet (0.5 mg total) by mouth every 6 (six) hours as needed for anxiety.   mouth rinse Liqd solution 15 mLs by Mouth Rinse route 2 (two) times daily.   nystatin 100000 UNIT/ML suspension Commonly known as:  MYCOSTATIN Take 5 mLs (500,000 Units total) by mouth 4 (four) times daily. Start taking on:  09/04/2016   ondansetron 4 MG disintegrating tablet Commonly known as:  ZOFRAN-ODT Take 1 tablet (4 mg total) by mouth every 6 (six) hours as needed for nausea.   ondansetron 4 MG/2ML Soln injection Commonly known as:  ZOFRAN Inject 2 mLs (4 mg total) into the vein every 6 (six) hours as needed for nausea.   pantoprazole 40 MG injection Commonly known as:  PROTONIX Inject 40 mg into the vein every 12 (twelve) hours.   piperacillin-tazobactam 3.375 GM/50ML IVPB Commonly known as:  ZOSYN Inject 50 mLs (3.375 g total) into the vein every 8 (eight) hours. Start taking on:  09/04/2016   polyethylene glycol packet Commonly known as:  MIRALAX / GLYCOLAX Take 17 g by mouth daily. Start taking on:  09/04/2016   potassium chloride SA 20 MEQ tablet Commonly known as:  K-DUR,KLOR-CON Take 1 tablet (20 mEq total) by mouth 2 (two) times daily.         Caroleen Hamman, MD FACS

## 2016-09-03 NOTE — Progress Notes (Signed)
Attempted x3 to notify Genevive Bi, MD about patient DG results. Handoff report given to nightshift RN.

## 2016-09-03 NOTE — Progress Notes (Signed)
09/03/2016  Subjective: Patient is 22 Days Post-Op s/p exploratory laparotomies and left thoracotomy.  No acute events overnight.  Patient reports eating 3 full meals yesterday.  Reports continued pain over the left chest tubes.  Vital signs: Temp:  [98.2 F (36.8 C)-98.5 F (36.9 C)] 98.5 F (36.9 C) (04/02 0533) Pulse Rate:  [72-80] 72 (04/02 0533) Resp:  [20] 20 (04/02 0533) BP: (97-111)/(58-65) 111/65 (04/02 0533) SpO2:  [99 %-100 %] 99 % (04/02 0533) Weight:  [53 kg (116 lb 12.8 oz)] 53 kg (116 lb 12.8 oz) (04/02 0700)   Intake/Output: 04/01 0701 - 04/02 0700 In: 3282 [P.O.:240; I.V.:2892; IV Piggyback:150] Out: 790 [Urine:750; Chest Tube:40] Last BM Date: 09/02/16  Physical Exam: Constitutional: No acute distress Pulm:  Chest tubes and pigtail catheters in place with serous drainage. Abdomen:  Wound vac in place with good seal, pigtail catheter with bilious fluid but very low output.  Labs:   Recent Labs  09/03/16 0455  WBC 10.4  HGB 10.6*  HCT 33.6*  PLT 754*    Recent Labs  08/31/16 1118 09/03/16 0455  NA  --  139  K 4.5 4.6  CL  --  105  CO2  --  28  GLUCOSE  --  120*  BUN  --  13  CREATININE  --  0.45  CALCIUM  --  8.7*   No results for input(s): LABPROT, INR in the last 72 hours.  Imaging: No results found.  Assessment/Plan: 51 yo female s/p exploratory laparotomies and left thoracotomy.  --Fistula output significantly decreased, on a soft diet. --Will start calorie count today and assess for possible decrease of TPN rate tomorrow.  Patient encouraged to continue eating and taking supplements. --continue ambulation. --change po pain meds from oxycodone to dilaudid --Wound vac change today --chest tubes per Dr. Genevive Bi.   Melvyn Neth, [place]

## 2016-09-03 NOTE — Progress Notes (Signed)
Chest tubes advanced and cut off at the skin level.  Heimlich valve placed on chest pigtail catheter.  All wounds clean and dry.  No purulence from any chest drain.  [employer].

## 2016-09-03 NOTE — Progress Notes (Signed)
qPhysical Therapy Treatment Patient Details Name: Lindsey Wolf MRN: [id number] DOB: 1965/10/14 Today's Date: 09/03/2016    History of Present Illness Pt is a 50 year old female status post complicated repair of peptic ulcer disease. She later underwent a repeat laparotomy for drainage of intra-abdominal abscesses. Pt diagnosed with Acute hypoxic respiratory failure with L chest tube placement, septic shock with hypotension, and A-fib with RVR.  Pt also has multiple JP drains and was intubated but is now extubated. Pt underwent L thoracotomy and thoracoscopy along with debridement of abdominal wall abscess on 08/08/16.  Pt now with B chest tubes, JP drains, and wound vac. Of note, small pneumothorax per chart review.  Patient underwent wound vac placement and debridement of abd wound 08/12/16. Pt now has several chest tubes draining to foley bag and additional chest tube to wall suction.    PT Comments    Pt progressing well towards goals.  Pt Mod I with bed mobility tasks with use of rails.  Pt able to stand from sitting at EOB confidently with little effort and no LOB.  Pt able to amb 500' with RW and SBA this session with improving cadence and without any signs of instability.  Pt reported feeling confident enough during amb with RW that she would like to attempt gait training without AD in the near future.  SpO2 100% and HR102 bpm immediately after amb compared to SpO2 96% and HR 87 bpm at baseline.Upon returning to room pt reported that she had a "knot" in her upper L chest that was 5/10 pain but improved upon returning to supine.    Nursing notified immediately of pt's c/o pain.  Pt will benefit from PT services to address above deficits for decreased caregiver assistance upon discharge.     Follow Up Recommendations  Other (comment) (Per progress)     Equipment Recommendations  Rolling walker with 5" wheels    Recommendations for Other Services       Precautions / Restrictions  Precautions Precautions: Fall Restrictions Weight Bearing Restrictions: No    Mobility  Bed Mobility Overal bed mobility: Modified Independent; Positioning education provided             General bed mobility comments: Pt Mod I with use of bed rails with bed mobility tasks  Transfers Overall transfer level: Needs assistance Equipment used: Rolling walker (2 wheeled) Transfers: Sit to/from Stand Sit to Stand: Supervision         General transfer comment: Pt performed sit to/from stand transfers from EOB with little effort and stood confidently with good posture without LOB  Ambulation/Gait Ambulation/Gait assistance: Supervision Ambulation Distance (Feet): 500 Feet Assistive device: Rolling walker (2 wheeled) Gait Pattern/deviations: Step-through pattern   Gait velocity interpretation: Below normal speed for age/gender General Gait Details: Good cadence with gait without SOB or LOB, SpO2 after amb 100% with HR increasing from baseline of 87 bpm to 102 bpm   Stairs            Wheelchair Mobility    Modified Rankin (Stroke Patients Only)       Balance Overall balance assessment: Needs assistance Sitting-balance support: Single extremity supported;Feet supported Sitting balance-Leahy Scale: Good     Standing balance support: Bilateral upper extremity supported Standing balance-Leahy Scale: Good                              Cognition Arousal/Alertness: Awake/alert Behavior During Therapy: WFL for tasks assessed/performed  Overall Cognitive Status: Within Functional Limits for tasks assessed                                        Exercises Total Joint Exercises Ankle Circles/Pumps: AROM;Both;10 reps; Pt encouraged/educated to continue HEP of APs, QS, and GS daily    General Comments        Pertinent Vitals/Pain Pain Assessment: 0-10 Pain Score: 5  Pain Location: L upper chest after amb, nursing aware Pain Descriptors  / Indicators: Aching    Home Living                      Prior Function            PT Goals (current goals can now be found in the care plan section) Progress towards PT goals: Progressing toward goals    Frequency    Min 2X/week      PT Plan Current plan remains appropriate    Co-evaluation             End of Session   Activity Tolerance: Patient tolerated treatment well Patient left: in bed;with bed alarm set;with call bell/phone within reach Nurse Communication: Other (comment) (Pt c/o 5/10 ache in upper L chest after amb, nursing aware)       Time: 1510-1535 PT Time Calculation (min) (ACUTE ONLY): 25 min  Charges:  $Gait Training: 8-22 mins $Therapeutic Activity: 8-22 mins                    G Codes:       DRoyetta Asal PT, DPT 09/03/16, 4:41 PM

## 2016-09-03 NOTE — Progress Notes (Signed)
ANTIBIOTIC/ANTIFUnGAL CONSULT NOTE - FOLLOW UP  Pharmacy Consult for Zosyn and Eraxis Indication: intra-abdominal infection  No Known Allergies  Patient Measurements: Height: 5\' 10"  (177.8 cm) Weight: 116 lb 12.8 oz (53 kg) IBW/kg (Calculated) : 68.5  Vital Signs: Temp: 97.8 F (36.6 C) (04/02 1205) Temp Source: Oral (04/02 1205) BP: 107/57 (04/02 1205) Pulse Rate: 76 (04/02 1205) Intake/Output from previous day: 04/01 0701 - 04/02 0700 In: 3282 [P.O.:240; I.V.:2892; IV Piggyback:150] Out: 790 [Urine:750; Chest Tube:40] Intake/Output from this shift: Total I/O In: 240 [P.O.:240] Out: 1200 [Urine:1200]  Labs:  Recent Labs  09/03/16 0455  WBC 10.4  HGB 10.6*  PLT 754*  CREATININE 0.45   Estimated Creatinine Clearance: 71.2 mL/min (by C-G formula based on SCr of 0.45 mg/dL).  Assessment: Pharmacy consulted to dose piperacillin/tazobactam and Eraxis in this 51 year old female with intrabdominal infection post perforated peptic ulcer. * ID following, see ID Notes as per 3/28- plan several more weeks of IV Zosyn, plan switch to po fluconazole at discharge.  Plan:  Continue Zosyn EI 3.375 g IV q8 hours and Eraxis (anidulafungin)   100 mg IV q24 hours.   Napoleon Form, PharmD Clinical Pharmacist 09/03/2016,4:15 PM

## 2016-09-03 NOTE — Consult Note (Signed)
[facility] Nurse wound follow up  Reason for Consult: Midline abdominal wound with NPWT. Penrose drain to LLQ in place.  Wound type:Surgical wound Pressure Injury POA: N/A Measurement: 10 cm x 1.5 cm x 0.3 cm with undermining at 6 o'clock, extends 0.8 cm. No purulence noted here today with dressing change.  Wound SPQ:[id number] tissue noted. Tissue is friable at distal ends, bleeds with cleansing.  Drainage (amount, consistency, odor) Minimal serosanguinous. No odor  Periwound:Intact. Drain to LLQ And left side Chest tube Dressing procedure/placement/frequency:NPWT dressing 1 piece black foam, change MON/WED/FRI WOC team will follow.  Domenic Moras RN BSN [place] Pager [phone]

## 2016-09-03 NOTE — Progress Notes (Signed)
OT Cancellation Note  Patient Details Name: Lindsey Wolf MRN: [id number] DOB: 1965-06-07   Cancelled Treatment:    Reason Eval/Treat Not Completed: Patient declined, no reason specified. Pt eating lunch upon OT arrival, in better spirits from last week and pt reports feeling "okay" requesting rest to complete meal and allow additional time for pain mgt following chest tube removal recently. Pt eager to get up with therapy later to walk. OT called PT on care team to notify of pt's willingness/motivation to participate. Pt motivated to participate with OT tomorrow afternoon to address standing activity tolerance. Will re-attempt OT treatment tomorrow.  Jeni Salles, MPH, MS, OTR/L ascom [phone] 09/03/16, 2:06 PM

## 2016-09-03 NOTE — Progress Notes (Signed)
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  Plan is for patient to be NPO for bowel rest and to re-start TPN. She was started on octreotide.  Patient Measurements: Height: 5\' 10"  (177.8 cm) Weight: 116 lb 12.8 oz (53 kg) IBW/kg (Calculated) : 68.5 TPN AdjBW (KG): 59.4 Body mass index is 16.76 kg/m.  Assessment:   GI: Soft Diet started 3/29 Endo:  Insulin requirements in the past 24 hours: 1 unit SSI q6h  Lytes: WNL, no supplementation necessary today Renal: ID:  Best Practices: TPN Access: Triple Lumen PICC placed 3/24 TPN start date: 3/24  Nutritional Goals (per RD recommendation on 3/25): kCal: 1700-1900 (MSJ x 1.3-1.5) Protein: 85-115 grams (1.5-2 grams/kg)  Plan:   Continue Clinimix E 5/15 @ 83 ml/hr + 20% ILE @ 20 ml/hr over 12 hours. Goal regimen provides 1894 kcal, 99.6 grams of protein, 2472 ml fluid daily. SSI Q6hr.  Next BMP to be checked  09/05/16.  Pharmacy will continue to monitor and adjust per consult.   Lenis Noon, PharmD Clinical Pharmacist 09/03/2016,3:15 PM

## 2016-09-04 LAB — GLUCOSE, CAPILLARY
GLUCOSE-CAPILLARY: 106 mg/dL — AB (ref 65–99)
Glucose-Capillary: 101 mg/dL — ABNORMAL HIGH (ref 65–99)
Glucose-Capillary: 108 mg/dL — ABNORMAL HIGH (ref 65–99)
Glucose-Capillary: 134 mg/dL — ABNORMAL HIGH (ref 65–99)

## 2016-09-04 MED ORDER — FAT EMULSION 20 % IV EMUL
250.0000 mL | INTRAVENOUS | Status: AC
  Administered 2016-09-04: 250 mL via INTRAVENOUS
  Filled 2016-09-04: qty 250

## 2016-09-04 MED ORDER — M.V.I. ADULT IV INJ
INJECTION | INTRAVENOUS | Status: AC
  Administered 2016-09-04: 18:00:00 via INTRAVENOUS
  Filled 2016-09-04: qty 1992

## 2016-09-04 NOTE — Progress Notes (Signed)
Patient ID: Lindsey Wolf, female   DOB: [birth year], 51 y.o.   MRN: [id number] Awaiting transfer to [place]. No new problems. All drains and cathetrs are intact. Stable

## 2016-09-04 NOTE — Progress Notes (Signed)
[facility] INFECTIOUS DISEASE PROGRESS NOTE Date of Admission:  07/20/2016     ID: Lindsey Wolf is a 51 y.o. female with  intrabdominal infection post perforated peptic ulcer Active Problems:   Perforated ulcer ([place])   Abdominal abscess ([place] [place])   Abnormal respirations   Pleural effusion   Respiratory distress   Empyema of left pleural space (HCC)   Protein-calorie malnutrition, severe   Subjective: Since I have last seen her her JP drains in abd are all out and the chest tubes are draining much less. She has advanced diet to reg, still on TPN. No fevers, wbc down to 10  ROS  Eleven systems are reviewed and negative except per hpi  Medications:  Antibiotics Given (last 72 hours)    Date/Time Action Medication Dose Rate   09/01/16 1530 Given   piperacillin-tazobactam (ZOSYN) IVPB 3.375 g 3.375 g 12.5 mL/hr   09/01/16 2117 Given   piperacillin-tazobactam (ZOSYN) IVPB 3.375 g 3.375 g 12.5 mL/hr   09/02/16 0546 Given   piperacillin-tazobactam (ZOSYN) IVPB 3.375 g 3.375 g 12.5 mL/hr   09/02/16 1631 Given   piperacillin-tazobactam (ZOSYN) IVPB 3.375 g 3.375 g 12.5 mL/hr   09/02/16 2227 Given   piperacillin-tazobactam (ZOSYN) IVPB 3.375 g 3.375 g 12.5 mL/hr   09/03/16 0629 Given   piperacillin-tazobactam (ZOSYN) IVPB 3.375 g 3.375 g 12.5 mL/hr   09/03/16 1849 Given   piperacillin-tazobactam (ZOSYN) IVPB 3.375 g 3.375 g 12.5 mL/hr   09/04/16 0255 Given   piperacillin-tazobactam (ZOSYN) IVPB 3.375 g 3.375 g 12.5 mL/hr   09/04/16 1059 Given   piperacillin-tazobactam (ZOSYN) IVPB 3.375 g 3.375 g 12.5 mL/hr     . anidulafungin  100 mg Intravenous Q24H  . dextrose 5 % with octreotide (SANDOSTATIN) 100 mcg  100 mcg Intravenous Q8H  . enoxaparin (LOVENOX) injection  40 mg Subcutaneous Q24H  . feeding supplement  1 Container Oral BID BM  . FLUoxetine  40 mg Oral q morning - 10a  . gabapentin  300 mg Oral TID  . insulin aspart  0-9 Units Subcutaneous Q6H  . mouth rinse  15 mL  Mouth Rinse BID  . nystatin  5 mL Oral QID  . pantoprazole (PROTONIX) IV  40 mg Intravenous Q12H  . piperacillin-tazobactam (ZOSYN)  IV  3.375 g Intravenous Q8H  . polyethylene glycol  17 g Oral Daily  . potassium chloride  40 mEq Oral BID  . protein supplement shake  11 oz Oral Q24H    Objective: Vital signs in last 24 hours: Temp:  [97.8 F (36.6 C)-98.4 F (36.9 C)] 97.8 F (36.6 C) (04/03 1225) Pulse Rate:  [73-90] 90 (04/03 1225) Resp:  [18] 18 (04/03 1225) BP: (108-116)/(65-67) 108/65 (04/03 1225) SpO2:  [96 %-100 %] 100 % (04/03 0529) Weight:  [49.7 kg (109 lb 9.6 oz)] 49.7 kg (109 lb 9.6 oz) (04/03 0500) Constitutional:  NAD HENT: [place]/AT, PERRLA, no scleral icterus Mouth/Throat: Oropharynx is clear and dry. No oropharyngeal exudate.  Cardiovascular: Normal rate, regular rhythm and normal heart sounds.  Pulmonary/Chest: clear  L chest tube and JP with minimal ss drainage Neck = supple, no nuchal rigidity Abdominal: Soft. abd nondistended, inciision along midline covered with wound vac Lymphadenopathy: no cervical adenopathy. No axillary adenopathy Neurological: alert and oriented to person, place, and time.  Skin: Skin is warm and dry. No rash noted. No erythema.  PICC RUE wnl wnl Psychiatric: a normal mood and affect.  behavior is normal.   Lab Results  Recent Labs  09/03/16 0455  WBC 10.4  HGB 10.6*  HCT 33.6*  NA 139  K 4.6  CL 105  CO2 28  BUN 13  CREATININE 0.45    Microbiology: Results for orders placed or performed during the hospital encounter of 07/20/16  [place] rt PCR ([place] only)     Status: None   Collection Time: 07/20/16  1:15 PM  Result Value Ref Range Status   Specimen source GC/Chlam ENDOCERVICAL  Final   Chlamydia Tr NOT DETECTED NOT DETECTED Final   N gonorrhoeae NOT DETECTED NOT DETECTED Final    Comment: (NOTE) 100  This methodology has not been evaluated in pregnant women or in 200  patients with a history of  hysterectomy. 300 400  This methodology will not be performed on patients less than 54  years of age.   Wet prep, genital     Status: Abnormal   Collection Time: 07/20/16  1:15 PM  Result Value Ref Range Status   Yeast Wet Prep HPF POC NONE SEEN NONE SEEN Final   Trich, Wet Prep NONE SEEN NONE SEEN Final   Clue Cells Wet Prep HPF POC NONE SEEN NONE SEEN Final   WBC, Wet Prep HPF POC RARE (A) NONE SEEN Final   Sperm NONE SEEN  Final  Blood culture (routine x 2)     Status: None   Collection Time: 07/20/16  3:07 PM  Result Value Ref Range Status   Specimen Description BLOOD left forearm  Final   Special Requests   Final    BOTTLES DRAWN AEROBIC AND ANAEROBIC AER12ML ANA12ML   Culture NO GROWTH 5 DAYS  Final   Report Status 07/25/2016 FINAL  Final  MRSA PCR Screening     Status: None   Collection Time: 07/20/16  9:29 PM  Result Value Ref Range Status   MRSA by PCR NEGATIVE NEGATIVE Final    Comment:        The GeneXpert MRSA Assay (FDA approved for NASAL specimens only), is one component of a comprehensive MRSA colonization surveillance program. It is not intended to diagnose MRSA infection nor to guide or monitor treatment for MRSA infections.   Aerobic/Anaerobic Culture (surgical/deep wound)     Status: None   Collection Time: 07/25/16  3:00 PM  Result Value Ref Range Status   Specimen Description ABSCESS RIGHT ABDOMEN  Final   Special Requests NONE  Final   Gram Stain   Final    RARE WBC PRESENT, PREDOMINANTLY PMN NO ORGANISMS SEEN    Culture   Final    No growth aerobically or anaerobically. Performed at [facility], [place] [address], [place], [place] [address]    Report Status 07/31/2016 FINAL  Final  Body fluid culture     Status: None   Collection Time: 07/30/16 11:40 AM  Result Value Ref Range Status   Specimen Description PLEURAL  Final   Special Requests NONE  Final   Gram Stain   Final    MODERATE WBC PRESENT, PREDOMINANTLY PMN NO ORGANISMS SEEN     Culture   Final    No growth aerobically or anaerobically. Performed at [facility], [place] [address], [place], [place] [address]    Report Status 08/03/2016 FINAL  Final  Acid Fast Smear (AFB)     Status: None   Collection Time: 07/30/16 11:40 AM  Result Value Ref Range Status   AFB Specimen Processing Concentration  Final   Acid Fast Smear Negative  Final  Comment: (NOTE) Performed At: [facility] [place], [place] [id number] Lindon Romp MD [id number]    Source (AFB) PLEURAL  Final  Aerobic Culture (superficial specimen)     Status: None   Collection Time: 08/06/16  3:34 PM  Result Value Ref Range Status   Specimen Description ABDOMEN  Final   Special Requests Normal  Final   Gram Stain   Final    RARE WBC PRESENT,BOTH PMN AND MONONUCLEAR NO ORGANISMS SEEN Performed at [facility], [address] [place], [place] [address]    Culture RARE ESCHERICHIA COLI  Final   Report Status 08/09/2016 FINAL  Final   Organism ID, Bacteria ESCHERICHIA COLI  Final      Susceptibility   Escherichia coli - MIC*    AMPICILLIN <=2 SENSITIVE Sensitive     CEFAZOLIN <=4 SENSITIVE Sensitive     CEFEPIME <=1 SENSITIVE Sensitive     CEFTAZIDIME <=1 SENSITIVE Sensitive     CEFTRIAXONE <=1 SENSITIVE Sensitive     CIPROFLOXACIN <=0.25 SENSITIVE Sensitive     GENTAMICIN <=1 SENSITIVE Sensitive     IMIPENEM <=0.25 SENSITIVE Sensitive     TRIMETH/SULFA <=20 SENSITIVE Sensitive     AMPICILLIN/SULBACTAM <=2 SENSITIVE Sensitive     PIP/TAZO <=4 SENSITIVE Sensitive     Extended ESBL NEGATIVE Sensitive     * RARE ESCHERICHIA COLI  Fungus Culture With Stain     Status: None   Collection Time: 08/08/16  3:31 PM  Result Value Ref Range Status   Fungus Stain Final report  Final   Fungus (Mycology) Culture Preliminary report  Final    Comment: (NOTE) Performed At: [facility] [address] [place], [place] [id number] Lindon Romp MD  [id number]    Fungal Source ABDOMEN  Final    Comment: ABDOMINAL WALL WOUND  GOES WITH ACC [id number]   Fungus Culture With Stain     Status: None (Preliminary result)   Collection Time: 08/08/16  3:31 PM  Result Value Ref Range Status   Fungus Stain Final report  Final    Comment: (NOTE) Performed At: [facility] [place], [place] [id number] Lindon Romp MD [id number]    Fungus (Mycology) Culture PENDING  Incomplete   Fungal Source ABDOMEN  Final    Comment: ABDOMINAL WALL 2  GOES WITH ACC [id number]   Fungus Culture With Stain     Status: None (Preliminary result)   Collection Time: 08/08/16  3:31 PM  Result Value Ref Range Status   Fungus Stain Final report  Final    Comment: (NOTE) Performed At: [facility] [place], [place] [id number] Lindon Romp MD [id number]:[id number]    Fungus (Mycology) Culture PENDING  Incomplete   Fungal Source PLEURAL  Final    Comment: RIGHT PLEURAL SPACE  Fungus Culture With Stain     Status: None (Preliminary result)   Collection Time: 08/08/16  3:31 PM  Result Value Ref Range Status   Fungus Stain Final report  Final    Comment: (NOTE) Performed At: [facility] [place], [place] [id number] Lindon Romp MD [id number]    Fungus (Mycology) Culture PENDING  Incomplete   Fungal Source PLEURAL  Final    Comment: LEFT PLEURAL SPACE  Aerobic/Anaerobic Culture (surgical/deep wound)     Status: None   Collection Time: 08/08/16  3:31 PM  Result Value Ref Range Status   Specimen Description WOUND  Final  Special Requests ABDOMINAL WALL WOUND  Final   Gram Stain   Final    ABUNDANT WBC PRESENT, PREDOMINANTLY PMN RARE GRAM POSITIVE COCCI IN PAIRS Performed at [facility], [place] [address], [place], [place] [address]    Culture   Final    FEW ESCHERICHIA COLI RARE ACTINOMYCES ODONTOLYTICUS SUSCEPTIBILITIES PERFORMED ON PREVIOUS CULTURE WITHIN THE LAST 5  DAYS. MIXED ANAEROBIC FLORA PRESENT.  CALL LAB IF FURTHER IID REQUIRED.    Report Status 08/13/2016 FINAL  Final  Aerobic/Anaerobic Culture (surgical/deep wound)     Status: None   Collection Time: 08/08/16  3:31 PM  Result Value Ref Range Status   Specimen Description WOUND  Final   Special Requests ABDOMINAL WALL WOUND 2  Final   Gram Stain   Final    ABUNDANT WBC PRESENT, PREDOMINANTLY PMN RARE GRAM POSITIVE COCCI IN PAIRS FEW GRAM POSITIVE RODS    Culture   Final    RARE ESCHERICHIA COLI RARE ACTINOMYCES ODONTOLYTICUS FEW BACTEROIDES FRAGILIS RARE BACTEROIDES THETAIOTAOMICRON BETA LACTAMASE POSITIVE FOR BOTH BACTEROIDES SPECIES Performed at [facility], [place] [address] [place], [place] [address]    Report Status 08/13/2016 FINAL  Final   Organism ID, Bacteria ESCHERICHIA COLI  Final      Susceptibility   Escherichia coli - MIC*    AMPICILLIN 4 SENSITIVE Sensitive     CEFAZOLIN <=4 SENSITIVE Sensitive     CEFEPIME <=1 SENSITIVE Sensitive     CEFTAZIDIME <=1 SENSITIVE Sensitive     CEFTRIAXONE <=1 SENSITIVE Sensitive     CIPROFLOXACIN <=0.25 SENSITIVE Sensitive     GENTAMICIN <=1 SENSITIVE Sensitive     IMIPENEM <=0.25 SENSITIVE Sensitive     TRIMETH/SULFA <=20 SENSITIVE Sensitive     AMPICILLIN/SULBACTAM <=2 SENSITIVE Sensitive     PIP/TAZO <=4 SENSITIVE Sensitive     Extended ESBL NEGATIVE Sensitive     * RARE ESCHERICHIA COLI  Aerobic/Anaerobic Culture (surgical/deep wound)     Status: None   Collection Time: 08/08/16  3:31 PM  Result Value Ref Range Status   Specimen Description WOUND  Final   Special Requests RIGHT PLURAL SPACE  Final   Gram Stain   Final    FEW WBC PRESENT,BOTH PMN AND MONONUCLEAR NO ORGANISMS SEEN    Culture   Final    NO GROWTH 5 DAYS Performed at [facility], [address] [place], [place] [address]    Report Status 08/13/2016 FINAL  Final  Aerobic/Anaerobic Culture (surgical/deep wound)     Status: None   Collection  Time: 08/08/16  3:31 PM  Result Value Ref Range Status   Specimen Description WOUND  Final   Special Requests LEFT PLERAL SPACE  Final   Gram Stain   Final    FEW WBC PRESENT,BOTH PMN AND MONONUCLEAR NO ORGANISMS SEEN    Culture   Final    MODERATE BACTEROIDES FRAGILIS BETA LACTAMASE POSITIVE Performed at [facility], [place] [address], [place], [name] [address]    Report Status 08/13/2016 FINAL  Final  Fungus Culture With Stain     Status: None (Preliminary result)   Collection Time: 08/08/16  3:31 PM  Result Value Ref Range Status   Fungus Stain Final report  Final    Comment: (NOTE) Performed At: [facility] [place], [place] [id number] Lindon Romp MD [id number]    Fungus (Mycology) Culture PENDING  Incomplete   Fungal Source PLEURAL  Final  Comment: PLEURAL PEEL  Aerobic/Anaerobic Culture (surgical/deep wound)     Status: None   Collection Time: 08/08/16  3:31 PM  Result Value Ref Range Status   Specimen Description WOUND  Final   Special Requests PLEURAL PEEL LEFT  Final   Gram Stain   Final    FEW WBC PRESENT,BOTH PMN AND MONONUCLEAR NO ORGANISMS SEEN Performed at [facility], [address] [place], [place] [address]    Culture   Final    FEW BACTEROIDES FRAGILIS BETA LACTAMASE POSITIVE RARE CLOSTRIDIUM CADAVERIS    Report Status 08/13/2016 FINAL  Final  Fungus Culture Result     Status: None   Collection Time: 08/08/16  3:31 PM  Result Value Ref Range Status   Result 1 Comment  Final    Comment: (NOTE) KOH/Calcofluor preparation:  no fungus observed. Performed At: [facility] [place], [place] [id number] Lindon Romp MD [id number]   Fungus Culture Result     Status: None   Collection Time: 08/08/16  3:31 PM  Result Value Ref Range Status   Result 1 Comment  Final    Comment: (NOTE) KOH/Calcofluor preparation:  no fungus observed. Performed At: [facility] [place], [place] [id number] Lindon Romp MD [id number]   Fungus Culture Result     Status: None   Collection Time: 08/08/16  3:31 PM  Result Value Ref Range Status   Result 1 Comment  Final    Comment: (NOTE) KOH/Calcofluor preparation:  no fungus observed. Performed At: [facility] [place], [place] [id number] Lindon Romp MD [id number]   Fungus Culture Result     Status: None   Collection Time: 08/08/16  3:31 PM  Result Value Ref Range Status   Result 1 Comment  Final    Comment: (NOTE) KOH/Calcofluor preparation:  no fungus observed. Performed At: [facility] [place], [place] [id number] Lindon Romp MD [id number]   Fungus Culture Result     Status: None   Collection Time: 08/08/16  3:31 PM  Result Value Ref Range Status   Result 1 Comment  Final    Comment: (NOTE) KOH/Calcofluor preparation:  no fungus observed. Performed At: [facility] [place], [place] [id number] Lindon Romp MD [id number]   Fungal organism reflex     Status: None   Collection Time: 08/08/16  3:31 PM  Result Value Ref Range Status   Fungal result 1 Candida albicans  Final    Comment: (NOTE) Light growth Performed At: [facility] [place], [place] [id number] Lindon Romp MD [id number]:[id number]   Aerobic/Anaerobic Culture (surgical/deep wound)     Status: None   Collection Time: 08/12/16  8:34 AM  Result Value Ref Range Status   Specimen Description WOUND PERITONEAL SITE FOR WOUND VAC DRESSING  Final   Special Requests NONE  Final   Gram Stain   Final    FEW WBC PRESENT, PREDOMINANTLY PMN RARE GRAM VARIABLE COCCI Performed at [facility], [place] [address], [place], [place] [address]    Culture   Final    RARE ESCHERICHIA COLI MIXED ANAEROBIC FLORA PRESENT.  CALL LAB IF FURTHER IID REQUIRED.    Report Status 08/18/2016 FINAL  Final   Organism ID, Bacteria  ESCHERICHIA COLI  Final      Susceptibility   Escherichia coli - MIC*    AMPICILLIN >=32 RESISTANT Resistant  CEFAZOLIN >=64 RESISTANT Resistant     CEFEPIME <=1 SENSITIVE Sensitive     CEFTAZIDIME <=1 SENSITIVE Sensitive     CEFTRIAXONE <=1 SENSITIVE Sensitive     CIPROFLOXACIN <=0.25 SENSITIVE Sensitive     GENTAMICIN <=1 SENSITIVE Sensitive     IMIPENEM <=0.25 SENSITIVE Sensitive     TRIMETH/SULFA <=20 SENSITIVE Sensitive     AMPICILLIN/SULBACTAM >=32 RESISTANT Resistant     PIP/TAZO <=4 SENSITIVE Sensitive     Extended ESBL NEGATIVE Sensitive     * RARE ESCHERICHIA COLI  Aerobic/Anaerobic Culture (surgical/deep wound)     Status: None   Collection Time: 08/17/16 12:30 PM  Result Value Ref Range Status   Specimen Description DRAINAGE  Final   Special Requests Normal  Final   Gram Stain   Final    FEW WBC PRESENT, PREDOMINANTLY PMN RARE GRAM POSITIVE COCCI IN PAIRS    Culture   Final    FEW BACTEROIDES FRAGILIS BETA LACTAMASE POSITIVE Performed at [facility], [place] [address], [place], [place] [address]    Report Status 08/22/2016 FINAL  Final  CULTURE, BLOOD (ROUTINE X 2) w Reflex to ID Panel     Status: None   Collection Time: 08/22/16  1:51 PM  Result Value Ref Range Status   Specimen Description BLOOD RIGHT HAND  Final   Special Requests BOTTLES DRAWN AEROBIC AND ANAEROBIC BCAV  Final   Culture NO GROWTH 5 DAYS  Final   Report Status 08/27/2016 FINAL  Final  CULTURE, BLOOD (ROUTINE X 2) w Reflex to ID Panel     Status: None   Collection Time: 08/23/16  6:05 AM  Result Value Ref Range Status   Specimen Description BLOOD R HAND  Final   Special Requests IN BOTH AEROBIC AND ANAEROBIC BOTTLES BCAV  Final   Culture NO GROWTH 5 DAYS  Final   Report Status 08/28/2016 FINAL  Final  Aerobic/Anaerobic Culture (surgical/deep wound)     Status: None   Collection Time: 08/23/16  3:20 PM  Result Value Ref Range Status   Specimen Description DRAINAGE  Final   Special  Requests Normal  Final   Gram Stain   Final    ABUNDANT WBC PRESENT,BOTH PMN AND MONONUCLEAR FEW GRAM POSITIVE COCCI IN PAIRS IN CLUSTERS RARE GRAM VARIABLE ROD Performed at [facility], [place] [address] [place], [place] [address]    Culture   Final    MODERATE ESCHERICHIA COLI MIXED ANAEROBIC FLORA PRESENT.  CALL LAB IF FURTHER IID REQUIRED.    Report Status 08/28/2016 FINAL  Final   Organism ID, Bacteria ESCHERICHIA COLI  Final      Susceptibility   Escherichia coli - MIC*    AMPICILLIN 8 SENSITIVE Sensitive     CEFAZOLIN <=4 SENSITIVE Sensitive     CEFEPIME <=1 SENSITIVE Sensitive     CEFTAZIDIME <=1 SENSITIVE Sensitive     CEFTRIAXONE <=1 SENSITIVE Sensitive     CIPROFLOXACIN <=0.25 SENSITIVE Sensitive     GENTAMICIN <=1 SENSITIVE Sensitive     IMIPENEM <=0.25 SENSITIVE Sensitive     TRIMETH/SULFA <=20 SENSITIVE Sensitive     AMPICILLIN/SULBACTAM <=2 SENSITIVE Sensitive     PIP/TAZO <=4 SENSITIVE Sensitive     Extended ESBL NEGATIVE Sensitive     * MODERATE ESCHERICHIA COLI    Studies/Results: Dg Chest 2 View  Result Date: 09/03/2016 CLINICAL DATA:  Chest pain. Recent laparotomy for intra-abdominal abscess drainage. EXAM: CHEST  2 VIEW COMPARISON:  None. FINDINGS: There are  3 chest tubes overlying the left lung, 1 with tip near the posterior apex, 1 at the lateral mid lung and 1 at the medial mid lung. The tips of 2 pigtail drainage catheters are coiled in the left upper quadrant abdomen. Right PICC line tip is at the cavoatrial junction. There is a small left apical pneumothorax. There is bilateral blunting of the costophrenic angles. No pneumothorax. No pulmonary edema. No focal consolidation. Cardiomediastinal contours are normal. IMPRESSION: 1. Small left apical pneumothorax. 2. Bibasilar small pleural effusions and subsegmental atelectasis without other focal lung disease. 3. 3 left-sided chest tubes and 2 left upper quadrant drainage catheters. Electronically Signed    By: Ulyses Jarred M.D.   On: 09/03/2016 16:51    Assessment/Plan: Lindsey Wolf is a 51 y.o. female with perforated peptic ulcer, with complicated course since admission with placement of an IR drain and then repeat surgery 2/26. She has been on zosyn since admission and started fluconazole 2/26 - changed to eraxis 3/14.  Multiple drains in place.  Had L empyema noted and s/Wolf decortication with about 500 cc pus aspirated with cx growing bacteroides, and clostridium and candida. Wound cx with E coli, actinomyces. - E coli- sensitive to zosyn.  WBC down 39-> 16.7 after surgery but increased and found to have loculated fluid on L chest  3/16 had repeat drain on L - cx with bacteroides Her R abd JP drain has some purulent drainage 3/19 - wbc decreasing. Remains weak 3/21 wbc up to 30. CT reviewed. [place] done.  3/23- 2 new drains placed by IR 3/22 with purulent drainage - GS with abundant wbc, GPC in pairs and clusters and gram variable rods. Draining some thin bilious material. Also upsize of L chest drain - some purulence in that drain now.Also had to have picc removed as not functioning but replaced 3/28 -  WBC down to 14, no pain.  4/3 - has continued to improve, no fevers, wbc down to 10, JP drains out, Chest tube with heimlich valve, min drainage from chest. Advancing diet and tolerating regular food now. On TPN stil Her last drain was placed 3/22 so she is 13 days s/Wolf hopefully adequate drainage of all abscesses in chest and abd.   Recommendations Would rec another 7 days of zosyn - stop date 4/11 Can stop antifungal coverage since has been on eraxis 21 days If after dc her fevers or wbc recur she would need to have repeat CT chest abd and pelvis to monitor for recurrent abscess. She would also need BCX to make sure no picc line infection given TPN  Thank you very much for the consult. Will follow with you.  Lindsey Wolf   09/04/2016, 2:49 PM

## 2016-09-04 NOTE — Progress Notes (Signed)
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  Plan is for patient to be NPO for bowel rest and to re-start TPN. She was started on octreotide.  Patient Measurements: Height: 5\' 10"  (177.8 cm) Weight: 109 lb 9.6 oz (49.7 kg) IBW/kg (Calculated) : 68.5 TPN AdjBW (KG): 59.4 Body mass index is 15.73 kg/m.  Assessment:   GI: Soft Diet started 3/29 Endo:  Insulin requirements in the past 24 hours: 1 unit SSI q6h  Lytes: WNL, no supplementation necessary today Renal: ID:  Best Practices: TPN Access: Triple Lumen PICC placed 3/24 TPN start date: 3/24  Nutritional Goals (per RD recommendation on 3/25): kCal: 1700-1900 (MSJ x 1.3-1.5) Protein: 85-115 grams (1.5-2 grams/kg)  Plan:   Continue Clinimix E 5/15 @ 83 ml/hr + 20% ILE @ 20 ml/hr over 12 hours.  Continue SSI Q6hr.  Next BMP to be checked  09/05/16.  Pharmacy will continue to monitor and adjust per consult.   Lenis Noon, PharmD, BCPS Clinical Pharmacist 09/04/2016,1:52 PM

## 2016-09-04 NOTE — Progress Notes (Signed)
Calorie Count Note  48 hour calorie count ordered. Day 1 results below:  Diet: Soft on 4/2 (has since been changed to Regular) Supplements: Ensure Enlive po BID, each supplement provides 350 kcal and 20 grams of protein (supplements have since been changed)  Breakfast 4/2: 300 kcal and 4 grams of protein (100% of biscuit with gravy and fruit cocktail) Lunch 4/2: 354 kcal and 25 grams of protein (100% of chicken salad sandwich on bread with mayo, 25% of sweet tea) Dinner 4/2: 305 kcal and 18 grams of protein (100% of grilled chicken salad with 3 packets of ranch dressing) Supplements: N/A (pt did not want to drink Ensure Enlive any longer - supplements now changed)  Total intake: 959 kcal (56% of minimum estimated needs)  47 grams of protein (55% of minimum estimated needs)  Estimated Nutritional Needs:  Kcal:  1700-1900 (MSJ x 1.3-1.5) Protein:  85-115 grams (1.5-2 grams/kg) Fluid:  1.7-1.9 L/day (30-35 ml/kg)  Nutrition Dx: Inadequate oral intake related to poor appetite, acute illness as evidenced by per patient/family report, meal completion < 50%. -Improving  Goal: Patient will meet greater than or equal to 90% of their needs. -Progressing  Intervention:  -Continue Clinimix E 5/15 @ 83 ml/hr + 20% ILE @ 20 ml/hr over 12 hours. Goal regimen provides 1894 kcal, 99.6 grams of protein, 2472 ml fluid daily. Plan was to consider tapering TPN today pending results of calorie count, but patient now with discharge order in. -Continue calorie count per MD. -Discussed with MD on 4/2 and okay to advance patient to regular diet. She is requesting items not available on soft diet. -Discontinued Ensure Enlive. -Provide Boost Breeze po BID, each supplement provides 250 kcal and 9 grams of protein. Patient prefers orange flavor. -Provide Premeir Protein po once daily, each supplement provides 160 kcal and 30 grams of protein. Patient prefers vanilla.  Willey Blade, MS, RD, LDN Pager:  [phone] After Hours Pager: [phone]

## 2016-09-04 NOTE — Progress Notes (Signed)
qPhysical Therapy Treatment Patient Details Name: Lindsey Wolf MRN: [id number] DOB: May 16, 1966 Today's Date: 09/04/2016    History of Present Illness Pt is a 51 year old female status post complicated repair of peptic ulcer disease. She later underwent a repeat laparotomy for drainage of intra-abdominal abscesses. Pt diagnosed with Acute hypoxic respiratory failure with L chest tube placement, septic shock with hypotension, and A-fib with RVR.  Pt also has multiple JP drains and was intubated but is now extubated. Pt underwent L thoracotomy and thoracoscopy along with debridement of abdominal wall abscess on 08/08/16.  Pt now with B chest tubes, JP drains, and wound vac. Of note, small pneumothorax per chart review.  Patient underwent wound vac placement and debridement of abd wound 08/12/16. Pt now has several chest tubes draining to foley bag and additional chest tube to wall suction.    PT Comments    Pt presents with deficits in strength, transfers, gait, balance, and activity tolerance but continues to progress and is very motivated to participate in therapy.  Per nursing OK to work with pt this date related to pt's c/o of L chest pain last session with pt reporting medical staff believes pain is related to chest tube removal.  Pt remains Mod I with all bed mobility tasks and continues to perform tasks with little perceived effort.  Pt SBA with transfers and Amb with use of RW and is steady without LOB.  Pt ambulated 200+ feet this session with reciprocal gait pattern and good cadence without LOB or SOB.  During static standing balance training pt performed well overall but did require min A several times to prevent LOB during the more challenging components of the session including standing in semi-tandem with head turns and feet together with eyes closed.  Pt will benefit from continued PT services to address above deficits for decreased caregiver assistance upon discharge.     Follow Up  Recommendations  LTACH     Equipment Recommendations  Rolling walker with 5" wheels    Recommendations for Other Services       Precautions / Restrictions Precautions Precautions: Fall Restrictions Weight Bearing Restrictions: No    Mobility  Bed Mobility Overal bed mobility: Modified Independent   Rolling: Modified independent (Device/Increase time)   Supine to sit: Modified independent (Device/Increase time) Sit to supine: Modified independent (Device/Increase time)   General bed mobility comments: Pt Mod I with use of bed rails with bed mobility tasks  Transfers Overall transfer level: Needs assistance Equipment used: Rolling walker (2 wheeled) Transfers: Sit to/from Stand Sit to Stand: Supervision         General transfer comment: Pt performed sit to/from stand transfers from EOB with little effort and stood confidently with good posture without LOB  Ambulation/Gait Ambulation/Gait assistance: Supervision Ambulation Distance (Feet): 200 Feet Assistive device: Rolling walker (2 wheeled) Gait Pattern/deviations: Step-through pattern   Gait velocity interpretation: Below normal speed for age/gender General Gait Details: Good cadence with gait without SOB or LOB   Stairs            Wheelchair Mobility    Modified Rankin (Stroke Patients Only)       Balance Overall balance assessment: Needs assistance Sitting-balance support: Feet supported;No upper extremity supported Sitting balance-Leahy Scale: Good     Standing balance support: No upper extremity supported Standing balance-Leahy Scale: Good Standing balance comment: Occasional min instability during static balance training with feet together and in semi-tandem without UE support  Cognition Arousal/Alertness: Awake/alert Behavior During Therapy: WFL for tasks assessed/performed Overall Cognitive Status: Within Functional Limits for tasks assessed                                         Exercises Other Exercises Other Exercises: Standing mini squats with small amplitude x 10 Other Exercises: Static standing balance training with feet apart, together, and semi-tandem with combinations of eyes open/closed and head turns/head still with several seated rest breaks required during session.    General Comments        Pertinent Vitals/Pain Pain Assessment: 0-10 Pain Score: 7  Pain Location: L side, nursing aware Pain Descriptors / Indicators: Aching Pain Intervention(s): Monitored during session;Limited activity within patient's tolerance    Home Living                      Prior Function            PT Goals (current goals can now be found in the care plan section) Progress towards PT goals: Progressing toward goals    Frequency           PT Plan Current plan remains appropriate    Co-evaluation             End of Session Equipment Utilized During Treatment: Gait belt Activity Tolerance: Patient tolerated treatment well Patient left: in bed;with bed alarm set;with call bell/phone within reach         Time: 4327-6147 PT Time Calculation (min) (ACUTE ONLY): 40 min  Charges:  $Gait Training: 8-22 mins $Therapeutic Exercise: 8-22 mins $Therapeutic Activity: 8-22 mins                    G Codes:       Linus Salmons PT, DPT 09/04/16, 4:34 PM

## 2016-09-05 LAB — GLUCOSE, CAPILLARY
GLUCOSE-CAPILLARY: 134 mg/dL — AB (ref 65–99)
GLUCOSE-CAPILLARY: 106 mg/dL — AB (ref 65–99)
GLUCOSE-CAPILLARY: 94 mg/dL (ref 65–99)
Glucose-Capillary: 149 mg/dL — ABNORMAL HIGH (ref 65–99)

## 2016-09-05 LAB — POTASSIUM: POTASSIUM: 4.8 mmol/L (ref 3.5–5.1)

## 2016-09-05 LAB — MAGNESIUM: MAGNESIUM: 2 mg/dL (ref 1.7–2.4)

## 2016-09-05 LAB — FUNGUS CULTURE WITH STAIN

## 2016-09-05 LAB — FUNGAL ORGANISM REFLEX

## 2016-09-05 LAB — FUNGUS CULTURE RESULT

## 2016-09-05 LAB — PHOSPHORUS: PHOSPHORUS: 4.7 mg/dL — AB (ref 2.5–4.6)

## 2016-09-05 MED ORDER — TRACE MINERALS CR-CU-MN-SE-ZN 10-1000-500-60 MCG/ML IV SOLN
INTRAVENOUS | Status: AC
  Administered 2016-09-05: 18:00:00 via INTRAVENOUS
  Filled 2016-09-05: qty 1992

## 2016-09-05 MED ORDER — CYCLOBENZAPRINE HCL 10 MG PO TABS
5.0000 mg | ORAL_TABLET | Freq: Three times a day (TID) | ORAL | Status: DC | PRN
  Administered 2016-09-05 – 2016-09-07 (×4): 5 mg via ORAL
  Filled 2016-09-05 (×4): qty 1

## 2016-09-05 MED ORDER — FAT EMULSION 20 % IV EMUL
250.0000 mL | INTRAVENOUS | Status: AC
  Administered 2016-09-05: 250 mL via INTRAVENOUS
  Filled 2016-09-05: qty 250

## 2016-09-05 MED ORDER — PIPERACILLIN-TAZOBACTAM 3.375 G IVPB
3.3750 g | Freq: Three times a day (TID) | INTRAVENOUS | Status: DC
  Administered 2016-09-05 – 2016-09-07 (×6): 3.375 g via INTRAVENOUS
  Filled 2016-09-05 (×10): qty 50

## 2016-09-05 NOTE — Care Management (Signed)
Authorization for LTACH denied.  MD scheduled for peer to peer 09/06/16 at 12:15pm

## 2016-09-05 NOTE — Progress Notes (Signed)
24 Days Post-Op   Subjective:  Patient reports continuing to improve. He does have some pain control issues but otherwise is tolerating a diet to some extent but not enough to meet her caloric needs. Continues on TPN. Denies any fevers, chills, nausea, vomiting. Having evidence of bowel function.  Vital signs in last 24 hours: Temp:  [97.8 F (36.6 C)-98.4 F (36.9 C)] 98.4 F (36.9 C) (04/04 0542) Pulse Rate:  [87-90] 87 (04/04 0542) Resp:  [18-20] 18 (04/04 0542) BP: (108-115)/(65-80) 115/80 (04/04 0542) SpO2:  [98 %-100 %] 98 % (04/04 0542) Weight:  [49.9 kg (110 lb)] 49.9 kg (110 lb) (04/04 0500) Last BM Date: 09/04/16  Intake/Output from previous day: 04/03 0701 - 04/04 0700 In: 1165.3 [I.V.:1165.3] Out: 1725 [Urine:1700; Drains:25]  GI: Abdomen soft, minimally tender to palpation in the midline, nondistended. Wound VAC in place to the midline with good seal. IR placed drain in the left lower quadrant with minimal ileus  Lab Results:  CBC  Recent Labs  09/03/16 0455  WBC 10.4  HGB 10.6*  HCT 33.6*  PLT 754*   CMP     Component Value Date/Time   NA 139 09/03/2016 0455   K 4.8 09/05/2016 0541   CL 105 09/03/2016 0455   CO2 28 09/03/2016 0455   GLUCOSE 120 (H) 09/03/2016 0455   BUN 13 09/03/2016 0455   CREATININE 0.45 09/03/2016 0455   CALCIUM 8.7 (L) 09/03/2016 0455   PROT 5.9 (L) 09/03/2016 0455   ALBUMIN 2.2 (L) 09/03/2016 0455   AST 25 09/03/2016 0455   ALT 14 09/03/2016 0455   ALKPHOS 190 (H) 09/03/2016 0455   BILITOT 0.3 09/03/2016 0455   GFRNONAA >60 09/03/2016 0455   GFRAA >60 09/03/2016 0455   PT/INR No results for input(s): LABPROT, INR in the last 72 hours.  Studies/Results: Dg Chest 2 View  Result Date: 09/03/2016 CLINICAL DATA:  Chest pain. Recent laparotomy for intra-abdominal abscess drainage. EXAM: CHEST  2 VIEW COMPARISON:  None. FINDINGS: There are 3 chest tubes overlying the left lung, 1 with tip near the posterior apex, 1 at the  lateral mid lung and 1 at the medial mid lung. The tips of 2 pigtail drainage catheters are coiled in the left upper quadrant abdomen. Right PICC line tip is at the cavoatrial junction. There is a small left apical pneumothorax. There is bilateral blunting of the costophrenic angles. No pneumothorax. No pulmonary edema. No focal consolidation. Cardiomediastinal contours are normal. IMPRESSION: 1. Small left apical pneumothorax. 2. Bibasilar small pleural effusions and subsegmental atelectasis without other focal lung disease. 3. 3 left-sided chest tubes and 2 left upper quadrant drainage catheters. Electronically Signed   By: Ulyses Jarred M.D.   On: 09/03/2016 16:51   Assessment/Plan:  51 year old female with a complicated hospital stay secondary to perforated peptic ulcers. Continues to improve. Apparently was denied LTAC from her insurance provider. Encourage ambulation, incentive spirometer usage, oral intake. Appreciate assistance from Dr. Faith Rogue for management of her chest tubes secondary to empyema. Appreciate wound care nurse, pharmacy, nutritional medicine assistance. Plan for peer-to-peer review of the patient's chart in attempts to get patient placed in LTAC.   Clayburn Pert, MD [facility]  Day [facility] [phone] Night [facility] [phone]  09/05/2016

## 2016-09-05 NOTE — Progress Notes (Signed)
OT Cancellation Note  Patient Details Name: Lindsey Wolf MRN: [id number] DOB: August 14, 1965   Cancelled Treatment:    Reason Eval/Treat Not Completed: Patient at procedure or test/ unavailable. Entered pt room, began preparing for tx session, MD entered for procedure. Unable to complete OT treatment at this time. Will re-attempt OT treatment as schedule allows and as pt is available.  Jeni Salles, MPH, MS, OTR/L ascom [phone] 09/05/16, 2:24 PM

## 2016-09-05 NOTE — Progress Notes (Signed)
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  Plan is for patient to be NPO for bowel rest and to re-start TPN. She was started on octreotide.  Patient Measurements: Height: 5\' 10"  (177.8 cm) Weight: 110 lb (49.9 kg) IBW/kg (Calculated) : 68.5 TPN AdjBW (KG): 59.4 Body mass index is 15.78 kg/m.  Assessment:   GI: Soft Diet started 3/29 Endo:  Insulin requirements in the past 24 hours: 1 unit SSI q6h  Lytes: WNL, no supplementation necessary today Renal: ID:  Best Practices: TPN Access: Triple Lumen PICC placed 3/24 TPN start date: 3/24  Nutritional Goals (per RD recommendation on 3/25): kCal: 1700-1900 (MSJ x 1.3-1.5) Protein: 85-115 grams (1.5-2 grams/kg)  Plan:   Continue Clinimix E 5/15 @ 83 ml/hr + 20% ILE @ 20 ml/hr over 12 hours.  Continue SSI Q6hr.  Next BMP to be checked  09/06/16.  Pharmacy will continue to monitor and adjust per consult.   Pernell Dupre, PharmD, BCPS Clinical Pharmacist 09/05/2016,10:57 AM

## 2016-09-05 NOTE — Progress Notes (Signed)
Calorie Count Note  48 hour calorie count ordered. Day 2 results below:  Diet: Regular Supplements: -Boost Breeze po BID, each supplement provides 250 kcal and 9 grams of protein. Patient prefers orange flavor. -Premeir Protein po once daily, each supplement provides 160 kcal and 30 grams of protein. Patient prefers vanilla.  Breakfast 4/3: 674 kcal, 28 grams of protein (75% of egg, 545% of two slices white bread with bacon and cheese, 100% orange and grape juice) Lunch 4/3: N/A (did not order) Dinner 4/3: 340 kcal, 23 grams of protein (50% of linguine, 100% of meat sauce with parmesan cheese) Supplements 4/3: N/A (patient did not have any supplements)  Total intake: 1014 kcal (60% of minimum estimated needs)  51 grams of protein (60% of minimum estimated needs)  Estimated Nutritional Needs: Kcal:1700-1900 (MSJ x 1.3-1.5) Protein:85-115 grams (1.5-2 grams/kg) Fluid:1.7-1.9 L/day (30-35 ml/kg)  Nutrition Dx: Inadequate oral intakerelated to poor appetite, acute illnessas evidenced by per patient/family report, meal completion < 50%. -Improving  Goal: Patient will meet greater than or equal to 90% of their needs. -Progressing  Intervention:  -Continue Clinimix E 5/15 @ 83 ml/hr + 20% ILE @ 20 ml/hr over 12 hours. Goal regimen provides 1894 kcal, 99.6 grams of protein, 2472 ml fluid daily.  -Continue calorie count per MD. -Provide Boost Breeze po BID, each supplement provides 250 kcal and 9 grams of protein. Patient prefers orange flavor. Encouraged patient to drink supplements between meals. -Provide Premeir Protein po once daily, each supplement provides 160 kcal and 30 grams of protein. Patient prefers vanilla. Encouraged patient to drink supplement.   Willey Blade, MS, RD, LDN Pager: [phone] After Hours Pager: [phone]

## 2016-09-06 LAB — CBC
HEMATOCRIT: 33.9 % — AB (ref 35.0–47.0)
HEMOGLOBIN: 10.6 g/dL — AB (ref 12.0–16.0)
MCH: 25.5 pg — AB (ref 26.0–34.0)
MCHC: 31.3 g/dL — AB (ref 32.0–36.0)
MCV: 81.3 fL (ref 80.0–100.0)
Platelets: 781 10*3/uL — ABNORMAL HIGH (ref 150–440)
RBC: 4.17 MIL/uL (ref 3.80–5.20)
RDW: 22.7 % — ABNORMAL HIGH (ref 11.5–14.5)
WBC: 14 10*3/uL — ABNORMAL HIGH (ref 3.6–11.0)

## 2016-09-06 LAB — COMPREHENSIVE METABOLIC PANEL
ALBUMIN: 2.3 g/dL — AB (ref 3.5–5.0)
ALK PHOS: 155 U/L — AB (ref 38–126)
ALT: 10 U/L — AB (ref 14–54)
ANION GAP: 3 — AB (ref 5–15)
AST: 17 U/L (ref 15–41)
BILIRUBIN TOTAL: 0.4 mg/dL (ref 0.3–1.2)
BUN: 15 mg/dL (ref 6–20)
CALCIUM: 8.6 mg/dL — AB (ref 8.9–10.3)
CO2: 28 mmol/L (ref 22–32)
Chloride: 104 mmol/L (ref 101–111)
Creatinine, Ser: 0.33 mg/dL — ABNORMAL LOW (ref 0.44–1.00)
GFR calc Af Amer: >60 mL/min (ref >60)
GFR calc non Af Amer: >60 mL/min (ref >60)
GLUCOSE: 106 mg/dL — AB (ref 65–99)
Potassium: 4.5 mmol/L (ref 3.5–5.1)
SODIUM: 135 mmol/L (ref 135–145)
TOTAL PROTEIN: 6.2 g/dL — AB (ref 6.5–8.1)

## 2016-09-06 LAB — GLUCOSE, CAPILLARY
GLUCOSE-CAPILLARY: 114 mg/dL — AB (ref 65–99)
GLUCOSE-CAPILLARY: 126 mg/dL — AB (ref 65–99)
Glucose-Capillary: 130 mg/dL — ABNORMAL HIGH (ref 65–99)
Glucose-Capillary: 105 mg/dL — ABNORMAL HIGH (ref 65–99)

## 2016-09-06 LAB — MAGNESIUM: Magnesium: 1.9 mg/dL (ref 1.7–2.4)

## 2016-09-06 LAB — PHOSPHORUS: Phosphorus: 4.9 mg/dL — ABNORMAL HIGH (ref 2.5–4.6)

## 2016-09-06 MED ORDER — FAT EMULSION 20 % IV EMUL
250.0000 mL | INTRAVENOUS | Status: AC
  Administered 2016-09-06: 250 mL via INTRAVENOUS
  Filled 2016-09-06: qty 250

## 2016-09-06 MED ORDER — TRACE MINERALS CR-CU-MN-SE-ZN 10-1000-500-60 MCG/ML IV SOLN: INTRAVENOUS | Status: DC

## 2016-09-06 MED ORDER — BOOST / RESOURCE BREEZE PO LIQD
1.0000 | Freq: Three times a day (TID) | ORAL | Status: DC
  Administered 2016-09-06 – 2016-09-14 (×23): 1 via ORAL

## 2016-09-06 MED ORDER — TRACE MINERALS CR-CU-MN-SE-ZN 10-1000-500-60 MCG/ML IV SOLN
INTRAVENOUS | Status: AC
  Administered 2016-09-06: 18:00:00 via INTRAVENOUS
  Filled 2016-09-06 (×2): qty 940

## 2016-09-06 MED ORDER — FAT EMULSION 20 % IV EMUL: 250.0000 mL | INTRAVENOUS | Status: DC

## 2016-09-06 MED ORDER — FAT EMULSION 20 % IV EMUL
250.0000 mL | INTRAVENOUS | Status: DC
  Filled 2016-09-06: qty 250

## 2016-09-06 MED ORDER — MEGESTROL ACETATE 40 MG PO TABS
40.0000 mg | ORAL_TABLET | Freq: Every day | ORAL | Status: DC
  Administered 2016-09-06 – 2016-09-14 (×9): 40 mg via ORAL
  Filled 2016-09-06 (×10): qty 1

## 2016-09-06 MED ORDER — M.V.I. ADULT IV INJ
INJECTION | INTRAVENOUS | Status: DC
  Filled 2016-09-06 (×3): qty 960

## 2016-09-06 NOTE — Care Management (Signed)
LTACH denial upheld

## 2016-09-06 NOTE — Progress Notes (Signed)
25 Days Post-Op   Subjective:  Patient seen numerous times throughout the day. He has worked with physical and occupational therapy. States she just lax in appetite and desires to eat more. Continues on TPN. Had a formed bowel movement earlier today.  Vital signs in last 24 hours: Temp:  [97.5 F (36.4 C)-98.2 F (36.8 C)] 97.5 F (36.4 C) (04/05 1335) Pulse Rate:  [90-105] 105 (04/05 1335) Resp:  [17-18] 17 (04/05 0422) BP: (110-119)/(67-75) 119/75 (04/05 1335) SpO2:  [99 %-100 %] 100 % (04/05 1335) Weight:  [49.6 kg (109 lb 6.4 oz)] 49.6 kg (109 lb 6.4 oz) (04/05 0500) Last BM Date: 09/06/16  Intake/Output from previous day: 04/04 0701 - 04/05 0700 In: 2401.4 [I.V.:2238.7; IV Piggyback:162.7] Out: 2300 [Urine:2300]  GI: Abdomen soft, nontender, nondistended. Midline wound currently dressed with a foam dressing. No evidence of purulence or spreading erythema. JP drain with minimal bilious output.  Lab Results:  CBC  Recent Labs  09/06/16 0502  WBC 14.0*  HGB 10.6*  HCT 33.9*  PLT 781*   CMP     Component Value Date/Time   NA 135 09/06/2016 0502   K 4.5 09/06/2016 0502   CL 104 09/06/2016 0502   CO2 28 09/06/2016 0502   GLUCOSE 106 (H) 09/06/2016 0502   BUN 15 09/06/2016 0502   CREATININE 0.33 (L) 09/06/2016 0502   CALCIUM 8.6 (L) 09/06/2016 0502   PROT 6.2 (L) 09/06/2016 0502   ALBUMIN 2.3 (L) 09/06/2016 0502   AST 17 09/06/2016 0502   ALT 10 (L) 09/06/2016 0502   ALKPHOS 155 (H) 09/06/2016 0502   BILITOT 0.4 09/06/2016 0502   GFRNONAA >60 09/06/2016 0502   GFRAA >60 09/06/2016 0502   PT/INR No results for input(s): LABPROT, INR in the last 72 hours.  Studies/Results: No results found.  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 Appreciate assistance from nutritional medicine, physical therapy, pharmacy with the management of this, the patient. At this point her referral for long-term acute  care has been fully deny by her insurance. However, patient continues to make improvements and could possibly be a candidate for skilled nursing facility as early as next week. Plan to start Megace to stimulate her appetite. Continue TPN until tolerating more of a diet. Encourage ambulation, incentive spirometer usage, oral intake.   Clayburn Pert, MD [facility]  Day [facility] [phone] Night [facility] [phone]  09/06/2016

## 2016-09-06 NOTE — Progress Notes (Signed)
Occupational Therapy Treatment Patient Details Name: Lindsey Wolf MRN: [id number] DOB: 1966-01-08 Today's Date: 09/06/2016    History of present illness Pt is a 51 year old female status post complicated repair of peptic ulcer disease. She later underwent a repeat laparotomy for drainage of intra-abdominal abscesses. Pt diagnosed with Acute hypoxic respiratory failure with L chest tube placement, septic shock with hypotension, and A-fib with RVR.  Pt also has multiple JP drains and was intubated but is now extubated. Pt underwent L thoracotomy and thoracoscopy along with debridement of abdominal wall abscess on 08/08/16.  Pt now with B chest tubes, JP drains, and wound vac. Of note, small pneumothorax per chart review.  Patient underwent wound vac placement and debridement of abd wound 08/12/16. Pt now has several chest tubes draining to foley bag and additional chest tube to wall suction.   OT comments  Pt seen for OT treatment session. Pt in good spirits, agreeable to OT session focused on standing activity tolerance for self care and meal prep. Pt able to perform bed mobility with modified independence, transfers with supervision and good posture with no LOB, and able to tolerate standing with no UE support during functional activities for 11:20 minutes on first attempt and 4:30 minutes on second attempt with brief seated rest break in between. Pt c/o slight dizziness/fatigue near end of session secondary to recent medications per pt report and returned to bed to rest. Pt progressing towards goals. Continues to be limited by decreased activity tolerance, decreased strength, and continues to benefit from skilled OT services to address noted impairments and functional deficits.    Follow Up Recommendations  SNF    Equipment Recommendations  Other (comment) (defer to next venue of care)    Recommendations for Other Services      Precautions / Restrictions Precautions Precautions:  Fall Restrictions Weight Bearing Restrictions: No       Mobility Bed Mobility Overal bed mobility: Modified Independent Bed Mobility: Supine to Sit;Sit to Supine Rolling: Modified independent (Device/Increase time)   Supine to sit: Modified independent (Device/Increase time) Sit to supine: Modified independent (Device/Increase time)   General bed mobility comments: additional time to complete, but no physical assist required to perform, no LOB  Transfers Overall transfer level: Needs assistance Equipment used: None Transfers: Sit to/from Stand Sit to Stand: Supervision         General transfer comment: pt able to perform multiple sit to stand transfers from EOB with little effort required, good posture, no LOB    Balance Overall balance assessment: Needs assistance Sitting-balance support: Feet supported;No upper extremity supported Sitting balance-Leahy Scale: Good     Standing balance support: No upper extremity supported;During functional activity Standing balance-Leahy Scale: Good Standing balance comment: slight instability with increased length of time in static standing during functional activity                           ADL either performed or assessed with clinical judgement   ADL                                               Vision       Perception     Praxis      Cognition Arousal/Alertness: Awake/alert Behavior During Therapy: WFL for tasks assessed/performed Overall Cognitive Status: Within Functional Limits for tasks  assessed                                          Exercises Other Exercises Other Exercises: facilitated problem solving and planning around meal prepping and supporting dietary recommendations with pt able to verbalize strategies to plans to implement after discharge to ensure adequate caloric intake Other Exercises: pt participated in static standing table top activity  incorporating standing activity tolerance incorporating visual tracking, reaching and cognitive problem solving to support functional independence with ADL, IADL, including meal prep   Shoulder Instructions       General Comments      Pertinent Vitals/ Pain       Pain Assessment: No/denies pain  Home Living                                          Prior Functioning/Environment              Frequency  Min 1X/week        Progress Toward Goals  OT Goals(current goals can now be found in the care plan section)  Progress towards OT goals: Progressing toward goals  Acute Rehab OT Goals Patient Stated Goal: go home OT Goal Formulation: With patient Potential to Achieve Goals: [place] Discharge plan needs to be updated;Frequency remains appropriate    Co-evaluation                 End of Session    OT Visit Diagnosis: Muscle weakness (generalized) (M62.81);Other abnormalities of gait and mobility (R26.89)   Activity Tolerance Patient tolerated treatment well   Patient Left in bed;with call bell/phone within reach   Nurse Communication          Time: 0045-9977 OT Time Calculation (min): 47 min  Charges: OT General Charges $OT Visit: 1 Procedure OT Treatments $Self Care/Home Management : 38-52 mins  Jeni Salles, MPH, MS, OTR/L ascom [phone] 09/06/16, 3:52 PM

## 2016-09-06 NOTE — Progress Notes (Signed)
Calorie Count Note  48 hour calorie count ordered. Day 3 results below:  Diet: Regular Supplements: -Boost Breeze po BID, each supplement provides 250 kcal and 9 grams of protein. Patient prefers orange flavor. -Premeir Protein po once daily, each supplement provides 160 kcal and 30 grams of protein. Patient prefers vanilla.  Breakfast 4/4: N/A (missed meal) Lunch 4/4: 429 kcal, 19 grams of protein (50% of bacon, egg, and cheese sandwich on white bread with margarine and mayonnaise) Dinner 4/4: 195 kcal, 6 grams of protein (50% of chicken salad, 100% of crackers and fruit) Supplements 4/4: 290 kcal, 17 grams of protein (25% of Premier Protein, 1 bottle of Boost Breeze)  Total intake: 914 kcal (54% of minimum estimated needs)  42 grams of protein (49% of minimum estimated needs)  Estimated Nutritional Needs: Kcal:1700-1900 (MSJ x 1.3-1.5) Protein:85-115 grams (1.5-2 grams/kg) Fluid:1.7-1.9 L/day (30-35 ml/kg)  Nutrition Dx: Inadequate oral intakerelated to poor appetite, acute illnessas evidenced by per patient/family report, meal completion <50%. -Improving  Goal: Patient will meet greater than or equal to 90% of their needs. -Progressing  Intervention: -TPN: Clinimix E 5/15 @ 83 ml/hr + 20% ILE @ 20 ml/hr over 12 hours. Goal regimen provides 1894 kcal, 99.6 grams of protein, 2472 ml fluid daily. Will discuss nutrition plan of care with MD. -Continue calorie count per MD. -Provide Boost Breeze po BID, each supplement provides 250 kcal and 9 grams of protein. Patient prefers orange flavor. Encouraged patient to drink supplements between meals. -Provide Premeir Protein po once daily, each supplement provides 160 kcal and 30 grams of protein. Patient prefers vanilla. Encouraged patient to drink supplement.   Willey Blade, MS, RD, LDN Pager: [phone] After Hours Pager: [phone]

## 2016-09-06 NOTE — Progress Notes (Signed)
Nutrition Follow-up  DOCUMENTATION CODES:   Severe malnutrition in context of acute illness/injury  INTERVENTION:  Discussed nutrition plan of care with MD. Plan is to decrease TPN to half rate. Recommend Clinimix E 5/15 @ 40 ml/hr with 20% ILE @ 20 ml/hr over 12 hours. This provides 922 kcal, 48 grams of protein, 1200 ml fluids daily.  Discontinued Premier Protein per patient request.  Provide Boost Breeze po TID with meals, each supplement provides 250 kcal and 9 grams of protein.  NUTRITION DIAGNOSIS:   Inadequate oral intake related to poor appetite, acute illness as evidenced by per patient/family report, meal completion < 50%.   Intake remains inadequate but is improving.  GOAL:   Patient will meet greater than or equal to 90% of their needs  Progressing.  MONITOR:   I & O's, Diet advancement, Labs, Weight trends, Supplement acceptance  REASON FOR ASSESSMENT:   Malnutrition Screening Tool    ASSESSMENT:   Lindsey Wolf is a 51 y.o. female with a 3 days hx of abdominal pain in the epigastric area and she thinks it was a gas type. Pain is moderate to severe in intensity and has worsening today.  -Patient was denies transfer to [facility]. Plan for peer-to-peer today.  Spoke with patient at bedside. She reports she is experiencing some nausea and wants to ask RN for medication. She reports she is having bowel movements. She reports her appetite remains about the same, which is still an improvement from earlier in her admission. She does not like Premier Protein because it is too sweet and would like it discontinued (reports only drank ~25% yesterday of one). She is amenable to drinking Boost Breeze TID with meals she enjoys the orange flavor and is also willing to try mixed berry.   Access: triple lumen PICC placed 08/25/2016  TPN: continues on goal regimen of Clinimix E 5/15 @ 83 ml/hr + 20% ILE @ 20 ml/hr over 12 hours  Medications reviewed and include: octreotide 100  micrograms Q8hrs, Novolog sliding scale Q6hrs (received 1 unit past 24 hrs), pantoprazole, Miralax, potassium chloride 40 mEq BID, Dilaudid PRN, Zofran PRN.  Labs reviewed: CBG 94-149 past 24 hrs, Creatinine 0.33, Anion gap 3, Phosphorus 4.9.  I/O: 0 ml recorded output from drains/negative pressure wound dressing past 24 hrs  Weight trend: 49.6 kg 4/5 (-7.1 kg/12.5% body weight over 7 weeks during admission, which is significant for time frame)  Diet Order:  Diet regular Room service appropriate? Yes; Fluid consistency: Thin Diet - low sodium heart healthy TPN (CLINIMIX-E) Adult  Skin:  Wound (see comment) (stage II pressure ulcer in nare)  Last BM:  09/06/2016  Height:   Ht Readings from Last 1 Encounters:  08/08/16 5\' 10"  (1.778 m)    Weight:   Wt Readings from Last 1 Encounters:  09/06/16 109 lb 6.4 oz (49.6 kg)    Ideal Body Weight:  68.18 kg  BMI:  Body mass index is 15.7 kg/m.  Estimated Nutritional Needs:   Kcal:  1700-1900 (MSJ x 1.3-1.5)  Protein:  85-115 grams (1.5-2 grams/kg)  Fluid:  1.7-1.9 L/day (30-35 ml/kg)  EDUCATION NEEDS:   Education needs addressed (High-Calorie, High-Protein Nutrition Therapy)  Willey Blade, MS, RD, LDN Pager: [phone] After Hours Pager: [phone]

## 2016-09-06 NOTE — Progress Notes (Signed)
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   Patient Measurements: Height: 5\' 10"  (177.8 cm) Weight: 109 lb 6.4 oz (49.6 kg) IBW/kg (Calculated) : 68.5 TPN AdjBW (KG): 59.4 Body mass index is 15.7 kg/m.  Assessment:   GI: Soft Diet started 3/29 Endo:  Insulin requirements in the past 24 hours: 1 unit Lytes: WNL (K 4.5, Mg 1.9, Phos 4.9) No supplementation necessary today. Continue KCl 40 mEq PO BID. Renal: ID:  Best Practices: TPN Access: Triple Lumen PICC placed 3/24 TPN start date: 3/24  Plan:   Decrease rate of Clinimix E 5/15 to 40 mL/hr  Continue 20% intralipid @ 20 ml/hr over 12 hours.  Continue SSI Q6hr.  Next BMP to be checked  09/07/16.  Pharmacy will continue to monitor and adjust per consult.   Lenis Noon, PharmD, BCPS Clinical Pharmacist 09/06/2016,12:37 PM

## 2016-09-06 NOTE — Progress Notes (Signed)
qPhysical Therapy Treatment Patient Details Name: Lindsey Wolf MRN: [id number] DOB: 1965-12-02 Today's Date: 09/06/2016    History of Present Illness Pt is a 51 year old female status post complicated repair of peptic ulcer disease. She later underwent a repeat laparotomy for drainage of intra-abdominal abscesses. Pt diagnosed with Acute hypoxic respiratory failure with L chest tube placement, septic shock with hypotension, and A-fib with RVR.  Pt also has multiple JP drains and was intubated but is now extubated. Pt underwent L thoracotomy and thoracoscopy along with debridement of abdominal wall abscess on 08/08/16.  Pt now with B chest tubes, JP drains, and wound vac. Of note, small pneumothorax per chart review.  Patient underwent wound vac placement and debridement of abd wound 08/12/16. Pt now has several chest tubes draining to foley bag and additional chest tube to wall suction.    PT Comments    Initial attempt, pt with chaplain, but agreeable to PT. Second attempt, pt agreeable and motivated for PT session. Deferred ambulation due to IV being on low battery. Pt participated well with stand exercises with focus on posture/core strength as well as lower extremity strength and balance. Pt challenged and fatigued toward session's end. Pt returned to bed post exercise stating chair is too uncomfortable. Continue PT to progress strength, endurance and balance to improve all functional mobility. Discharge recommendation changed to home health PT; discussed with primary therapist and notified SW/CM through summary treatment team notes.    Follow Up Recommendations  Home health PT     Equipment Recommendations  Rolling walker with 5" wheels    Recommendations for Other Services       Precautions / Restrictions Precautions Precautions: Fall Restrictions Weight Bearing Restrictions: No    Mobility  Bed Mobility Overal bed mobility: Modified Independent Bed Mobility: Supine to Sit;Sit  to Supine Rolling: Modified independent (Device/Increase time)   Supine to sit: Modified independent (Device/Increase time) Sit to supine: Modified independent (Device/Increase time)   General bed mobility comments: use of rail  Transfers Overall transfer level: Modified independent Equipment used: None Transfers: Sit to/from Stand Sit to Stand: Supervision         General transfer comment: Stands well from bed and steps to counter  Ambulation/Gait             General Gait Details: Deferred gait due to IV with low battery and needing charged. Performed all stand exercises with/without UE support for strengthening and balance   Stairs            Wheelchair Mobility    Modified Rankin (Stroke Patients Only)       Balance Overall balance assessment: Needs assistance Sitting-balance support: Feet supported;No upper extremity supported Sitting balance-Leahy Scale: Good     Standing balance support: Single extremity supported;No upper extremity supported;Bilateral upper extremity supported Standing balance-Leahy Scale:  (Good balance with BUE support; fair + without UE support) Standing balance comment: slight instability with increased length of time in static standing during functional activity                            Cognition Arousal/Alertness: Awake/alert Behavior During Therapy: WFL for tasks assessed/performed Overall Cognitive Status: Within Functional Limits for tasks assessed                                 General Comments: Pt becomes tearful during session due to  having "a lot on her plate" Pt wishes to press through; demonstrates good motivation      Exercises General Exercises - Lower Extremity Hip ABduction/ADduction: Strengthening;Both;Standing;10 reps;Other (comment) (1 set with BUE support; 1 set with single UE support) Straight Leg Raises: Strengthening;Both;10 reps;Standing;Other (comment) (1 set with single  UE support; 1 set w/ UE support) Hip Flexion/Marching: Strengthening;Both;10 reps;Standing (1 set single UE support; 1 set w/o UE support) Other Exercises Other Exercises: 1 set stand hip extension 10x with BUEs; 1 set of 7 w/ single UE support Other Exercises: 1 set stand ham curl 10x with BUE support; 1 set 5 with single UE support    General Comments        Pertinent Vitals/Pain Pain Assessment:  (Reports mild pain along abdomen post wound vac removal) Faces Pain Scale: Hurts little more Pain Location: abdomen    Home Living                      Prior Function            PT Goals (current goals can now be found in the care plan section) Acute Rehab PT Goals Patient Stated Goal: go home Progress towards PT goals: Progressing toward goals    Frequency    Min 2X/week      PT Plan Discharge plan needs to be updated    Co-evaluation             End of Session   Activity Tolerance: Patient limited by fatigue;Patient tolerated treatment well Patient left: in bed;with call bell/phone within reach   PT Visit Diagnosis: Muscle weakness (generalized) (M62.81);Difficulty in walking, not elsewhere classified (R26.2) Pain - Right/Left: Left     Time: 3383-2919 PT Time Calculation (min) (ACUTE ONLY): 24 min  Charges:  $Therapeutic Exercise: 23-37 mins                    G Codes:        Larae Grooms, PTA 09/06/2016, 5:00 PM

## 2016-09-06 NOTE — Progress Notes (Signed)
Nurse asked [place] to visit the Pt whom she stated was depressed. [place] met Pt. Pt stated she has been in hospital for 48 days. During this period, Pt's mother died, but Pt was not able to attend funeral. Pt stated she is distressed. 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 [place] encouraged Pt, provided emotional support and prayer, and presence. [place] will do a follow-up with the Pt regularly.    09/06/16 1900  Clinical Encounter Type  Visited With Patient  Visit Type Follow-up  Referral From Nurse  Consult/Referral To Chaplain  Spiritual Encounters  Spiritual Needs Prayer;Emotional;Other (Comment)

## 2016-09-07 LAB — FUNGUS CULTURE WITH STAIN

## 2016-09-07 LAB — GLUCOSE, CAPILLARY
Glucose-Capillary: 100 mg/dL — ABNORMAL HIGH (ref 65–99)
Glucose-Capillary: 110 mg/dL — ABNORMAL HIGH (ref 65–99)
Glucose-Capillary: 92 mg/dL (ref 65–99)
Glucose-Capillary: 97 mg/dL (ref 65–99)

## 2016-09-07 LAB — FUNGUS CULTURE RESULT

## 2016-09-07 LAB — CBC
HCT: 33.6 % — ABNORMAL LOW (ref 35.0–47.0)
Hemoglobin: 10.6 g/dL — ABNORMAL LOW (ref 12.0–16.0)
MCH: 26.5 pg (ref 26.0–34.0)
MCHC: 31.7 g/dL — AB (ref 32.0–36.0)
MCV: 83.9 fL (ref 80.0–100.0)
PLATELETS: 694 10*3/uL — AB (ref 150–440)
RBC: 4.01 MIL/uL (ref 3.80–5.20)
RDW: 22.9 % — AB (ref 11.5–14.5)
WBC: 9.5 10*3/uL (ref 3.6–11.0)

## 2016-09-07 LAB — FUNGAL ORGANISM REFLEX

## 2016-09-07 LAB — PHOSPHORUS: PHOSPHORUS: 4.6 mg/dL (ref 2.5–4.6)

## 2016-09-07 LAB — POTASSIUM: POTASSIUM: 4.1 mmol/L (ref 3.5–5.1)

## 2016-09-07 LAB — MAGNESIUM: MAGNESIUM: 1.8 mg/dL (ref 1.7–2.4)

## 2016-09-07 MED ORDER — FAT EMULSION 20 % IV EMUL
250.0000 mL | INTRAVENOUS | Status: AC
  Administered 2016-09-07: 250 mL via INTRAVENOUS
  Filled 2016-09-07: qty 250

## 2016-09-07 MED ORDER — PIPERACILLIN-TAZOBACTAM 3.375 G IVPB
3.3750 g | Freq: Three times a day (TID) | INTRAVENOUS | Status: DC
  Administered 2016-09-08 – 2016-09-14 (×19): 3.375 g via INTRAVENOUS
  Filled 2016-09-07 (×24): qty 50

## 2016-09-07 MED ORDER — TRACE MINERALS CR-CU-MN-SE-ZN 10-1000-500-60 MCG/ML IV SOLN
INTRAVENOUS | Status: AC
  Administered 2016-09-07: 18:00:00 via INTRAVENOUS
  Filled 2016-09-07: qty 960

## 2016-09-07 MED ORDER — PIPERACILLIN-TAZOBACTAM 3.375 G IVPB
3.3750 g | Freq: Three times a day (TID) | INTRAVENOUS | Status: AC
  Administered 2016-09-07: 3.375 g via INTRAVENOUS

## 2016-09-07 MED ORDER — HYDROCODONE-ACETAMINOPHEN 5-325 MG PO TABS
1.0000 | ORAL_TABLET | ORAL | Status: DC | PRN
  Administered 2016-09-07 – 2016-09-11 (×13): 1 via ORAL
  Filled 2016-09-07 (×13): qty 1

## 2016-09-07 NOTE — Clinical Social Work Note (Signed)
CSW met pt to address discharge plan. BCBS denied LTAC and SNF will presued. CSW will updated FL2 and resend SNF referral to [facility]. Pt is aware and in agreement with this. CSW will continue to follow.   Darden Dates, MSW, LCSW Clinical Social Worker  [phone]

## 2016-09-07 NOTE — Progress Notes (Signed)
Patient c/o of  Abd pain and states prn Dilaudid has been ineffective; rates pain at present time 7 out 10; Dr. Dahlia Byes notified and order to give prn Norco; order initiated; Will continue to monitor

## 2016-09-07 NOTE — Progress Notes (Signed)
26 Days Post-Op   Subjective:  Patient without issue overnight. She has been tolerating more of a diet. States her pain is currently controlled.  Vital signs in last 24 hours: Temp:  [97.5 F (36.4 C)-98.3 F (36.8 C)] 98 F (36.7 C) (04/06 0900) Pulse Rate:  [78-105] 78 (04/06 0900) Resp:  [16-18] 16 (04/06 0900) BP: (107-119)/(69-75) 111/69 (04/06 0900) SpO2:  [100 %] 100 % (04/06 0900) Weight:  [50.5 kg (111 lb 6.4 oz)] 50.5 kg (111 lb 6.4 oz) (04/06 0500) Last BM Date: 09/06/16  Intake/Output from previous day: 04/05 0701 - 04/06 0700 In: 1733.3 [P.O.:240; I.V.:1393.3; IV Piggyback:100] Out: 2420 [Urine:2400; Drains:20]  GI: Abdomen is soft, nontender, nondistended. Dressings are in place without any evidence of purulence or spreading erythema. Drains in place without change. Abdominal drain with minimal bilious output. Chest drains in place with serous and was output.  Lab Results:  CBC  Recent Labs  09/06/16 0502 09/07/16 0708  WBC 14.0* 9.5  HGB 10.6* 10.6*  HCT 33.9* 33.6*  PLT 781* 694*   CMP     Component Value Date/Time   NA 135 09/06/2016 0502   K 4.1 09/07/2016 0459   CL 104 09/06/2016 0502   CO2 28 09/06/2016 0502   GLUCOSE 106 (H) 09/06/2016 0502   BUN 15 09/06/2016 0502   CREATININE 0.33 (L) 09/06/2016 0502   CALCIUM 8.6 (L) 09/06/2016 0502   PROT 6.2 (L) 09/06/2016 0502   ALBUMIN 2.3 (L) 09/06/2016 0502   AST 17 09/06/2016 0502   ALT 10 (L) 09/06/2016 0502   ALKPHOS 155 (H) 09/06/2016 0502   BILITOT 0.4 09/06/2016 0502   GFRNONAA >60 09/06/2016 0502   GFRAA >60 09/06/2016 0502   PT/INR No results for input(s): LABPROT, INR in the last 72 hours.  Studies/Results: No results found.  Assessment/Plan: 51 year old female status post multiple operations secondary to, complications for perforated peptic ulcers. Gradually improving. Started on Megace. Due to her poor nutritional status plan to continue TPN at least through the weekend. Encourage  ambulation, incentive spirometer usage, oral intake. Appreciate nutrition assistance with her TPN.   Clayburn Pert, MD [facility]  Day [facility] [phone] Night [facility] [phone]  09/07/2016

## 2016-09-07 NOTE — Progress Notes (Addendum)
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   Patient Measurements: Height: 5\' 10"  (177.8 cm) Weight: 111 lb 6.4 oz (50.5 kg) IBW/kg (Calculated) : 68.5 TPN AdjBW (KG): 59.4 Body mass index is 15.98 kg/m.  Assessment:   GI: Soft Diet started 3/29 Endo:  Insulin requirements in the past 24 hours: 1 unit Lytes: WNL (K 4.1, Mg 1.8, Phos 4.6) No supplementation necessary today. Continue KCl 40 mEq PO BID. Renal: ID:  Best Practices: TPN Access: Triple Lumen PICC placed 3/24 TPN start date: 3/24  Plan:   Continue Clinimix E 5/15 @ 40 mL/hr  Continue 20% intralipid @ 20 ml/hr over 12 hours.  Continue SSI Q6hr.  Next BMP to be checked  09/09/16.  Pharmacy will continue to monitor and adjust per consult.   Lenis Noon, PharmD, BCPS Clinical Pharmacist 09/07/2016,1:18 PM

## 2016-09-07 NOTE — Progress Notes (Signed)
Calorie Count Note  48 hour calorie count ordered. Day 4 results below:  Diet: Regular Supplements: -Boost Breeze po TID, each supplement provides 250 kcal and 9 grams of protein.  Breakfast 4/5: N/A (patient did not order) Lunch 4/5: 217 kcal, 6 grams of protein (100% of chicken noodle soup with 3 packs of crackers and fruit cup) Dinner 4/5: 396 kcal, 31 grams of protein (90% of chef salad with [place] and ham, 2 packets ranch dressing) Supplements 4/5: 500 kcal, 18 grams of protein (2 cartons Boost Breeze)  Total intake: 1113 kcal (65% of minimum estimated needs)  55 grams of protein (65% of minimum estimated needs)  Estimated Nutritional Needs: Kcal:1700-1900 (MSJ x 1.3-1.5) Protein:85-115 grams (1.5-2 grams/kg) Fluid:1.7-1.9 L/day (30-35 ml/kg)  Nutrition Dx: Inadequate oral intakerelated to poor appetite, acute illnessas evidenced by per patient/family report, meal completion <50%. -Improving  Goal: Patient will meet greater than or equal to 90% of their needs. -Progressing  Intervention:  -TPN: Clinimix E 5/15 @ 40 ml/hr with 20% ILE @ 20 ml/hr over 12 hours. This provides 922 kcal, 48 grams of protein, 1200 ml fluids daily. -Boost Breeze po TID with meals, each supplement provides 250 kcal and 9 grams of protein.  Willey Blade, MS, RD, LDN Pager: [phone] After Hours Pager: [phone]

## 2016-09-08 LAB — GLUCOSE, CAPILLARY
GLUCOSE-CAPILLARY: 100 mg/dL — AB (ref 65–99)
GLUCOSE-CAPILLARY: 86 mg/dL (ref 65–99)
GLUCOSE-CAPILLARY: 98 mg/dL (ref 65–99)
Glucose-Capillary: 92 mg/dL (ref 65–99)

## 2016-09-08 MED ORDER — TRACE MINERALS CR-CU-MN-SE-ZN 10-1000-500-60 MCG/ML IV SOLN
INTRAVENOUS | Status: AC
  Administered 2016-09-08: 18:00:00 via INTRAVENOUS
  Filled 2016-09-08: qty 960

## 2016-09-08 MED ORDER — FAT EMULSION 20 % IV EMUL
250.0000 mL | INTRAVENOUS | Status: AC
  Administered 2016-09-08: 250 mL via INTRAVENOUS
  Filled 2016-09-08: qty 250

## 2016-09-08 NOTE — Progress Notes (Signed)
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   Patient Measurements: Height: 5\' 10"  (177.8 cm) Weight: 117 lb 14.4 oz (53.5 kg) IBW/kg (Calculated) : 68.5 TPN AdjBW (KG): 59.4 Body mass index is 16.92 kg/m.  Assessment:   GI: Soft Diet started 3/29 Endo:  Insulin requirements in the past 24 hours: 0 unit Lytes: WNL on 4/6 (K 4.1, Mg 1.8, Phos 4.6) No supplementation necessary today. Continue KCl 40 mEq PO BID. Renal: ID:  Best Practices: TPN Access: Triple Lumen PICC placed 3/24 TPN start date: 3/24  Plan:   Continue Clinimix E 5/15 @ 40 mL/hr  Continue 20% intralipid @ 20 ml/hr over 12 hours.  Continue SSI Q6hr.  Next BMP to be checked  09/09/16.  Pharmacy will continue to monitor and adjust per consult.   Larene Beach, PharmD, BCPS Clinical Pharmacist 09/08/2016,11:50 AM

## 2016-09-08 NOTE — Progress Notes (Signed)
AVSS. Tolerating diet better. Reports being gassy after regular diet started, improving. Has several dietary preferences, encourage to have food from outside if needed. Lungs: Clear, Cardio: RR. ABD: Soft. Extrem: Soft Drains: Intact.  Encouraged po intake. On 40 cc/ hr TPN.

## 2016-09-09 LAB — GLUCOSE, CAPILLARY
Glucose-Capillary: 87 mg/dL (ref 65–99)
Glucose-Capillary: 106 mg/dL — ABNORMAL HIGH (ref 65–99)
Glucose-Capillary: 101 mg/dL — ABNORMAL HIGH (ref 65–99)

## 2016-09-09 LAB — MAGNESIUM: Magnesium: 1.8 mg/dL (ref 1.7–2.4)

## 2016-09-09 LAB — PHOSPHORUS: Phosphorus: 4.8 mg/dL — ABNORMAL HIGH (ref 2.5–4.6)

## 2016-09-09 LAB — POTASSIUM: POTASSIUM: 4.3 mmol/L (ref 3.5–5.1)

## 2016-09-09 MED ORDER — TRACE MINERALS CR-CU-MN-SE-ZN 10-1000-500-60 MCG/ML IV SOLN
INTRAVENOUS | Status: AC
  Administered 2016-09-09: 17:00:00 via INTRAVENOUS
  Filled 2016-09-09 (×2): qty 940

## 2016-09-09 MED ORDER — TRACE MINERALS CR-CU-MN-SE-ZN 10-1000-500-60 MCG/ML IV SOLN
INTRAVENOUS | Status: DC
  Filled 2016-09-09: qty 960

## 2016-09-09 MED ORDER — FAT EMULSION 20 % IV EMUL
250.0000 mL | INTRAVENOUS | Status: AC
  Administered 2016-09-09: 250 mL via INTRAVENOUS
  Filled 2016-09-09: qty 250

## 2016-09-09 NOTE — Progress Notes (Signed)
No complaints. Tolerating diet.  Encourage re: proteins. Albumin: slowly trending up. Will be home alone when eventually discharged, (had lived with her mother prior to hospitalization).

## 2016-09-09 NOTE — NC FL2 (Signed)
[place] LEVEL OF CARE SCREENING TOOL     IDENTIFICATION  Patient Name: Lindsey Wolf Birthdate: Sep 09, 1965 Sex: female Admission Date (Current Location): 07/20/2016  [place] and [place] Number:  Engineering geologist and Address:  [facility], [address], [place], [place] [address]      Provider Number: [id number]  Attending Physician Name and Address:  Olean Ree, MD  Relative Name and Phone Number:       Current Level of Care: Hospital Recommended Level of Care: [place] Prior Approval Number:    Date Approved/Denied:   PASRR Number: [id number]  Discharge Plan: SNF    Current Diagnoses: Patient Active Problem List   Diagnosis Date Noted  . Protein-calorie malnutrition, severe 08/17/2016  . 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 Abdominal abscess ([place])   . Abnormal respirations   . Pleural effusion   . Respiratory distress   . Perforated ulcer ([place])     Orientation RESPIRATION BLADDER Height & Weight     Self, Situation, Time, Place  Normal Continent Weight: 112 lb 1.6 oz (50.8 kg) Height:  5\' 10"  (177.8 cm)  BEHAVIORAL SYMPTOMS/MOOD NEUROLOGICAL BOWEL NUTRITION STATUS   (none)  (none) Continent Diet (soft)  AMBULATORY STATUS COMMUNICATION OF NEEDS Skin   Extensive Assist Verbally Surgical wounds, PU Stage and Appropriate Care, Wound Vac                       Personal Care Assistance Level of Assistance  Bathing, Feeding, Dressing Bathing Assistance: Maximum assistance Feeding assistance: Independent Dressing Assistance: Maximum assistance     Functional Limitations Info   (no issues)          SPECIAL CARE FACTORS FREQUENCY  PT (By licensed PT), OT (By licensed OT)     PT Frequency: Up to 5X per day, 5 days per week OT Frequency: Up to 5X per day, 5 days per week            Contractures Contractures Info: Present    Additional Factors Info  Code Status, Allergies  Code Status Info: Full Allergies Info: NKA           Current Medications (09/09/2016):  This is the current hospital active medication list Current Facility-Administered Medications  Medication Dose Route Frequency Provider Last Rate Last Dose  . acetaminophen (TYLENOL) tablet 1,000 mg  1,000 mg Oral Q6H PRN Olean Ree, MD   1,000 mg at 09/07/16 1533  . bisacodyl (DULCOLAX) suppository 10 mg  10 mg Rectal Daily PRN Olean Ree, MD      . cyclobenzaprine (FLEXERIL) tablet 5 mg  5 mg Oral TID PRN Clayburn Pert, MD   5 mg at 09/07/16 1751  . dextrose 5 % with octreotide (SANDOSTATIN) 100 mcg  100 mcg Intravenous Q8H Olean Ree, MD   100 mcg at 09/09/16 0551  . enoxaparin (LOVENOX) injection 40 mg  40 mg Subcutaneous Q24H Ramond Dial, RPH   40 mg at 09/09/16 0941  . feeding supplement (BOOST / RESOURCE BREEZE) liquid 1 Container  1 Container Oral TID WC Clayburn Pert, MD   1 Container at 09/09/16 1147  . FLUoxetine (PROZAC) capsule 40 mg  40 mg Oral q morning - 10a Olean Ree, MD   40 mg at 09/09/16 0940  . gabapentin (NEURONTIN) tablet 300 mg  300 mg Oral TID Jules Husbands, MD   300 mg at 09/09/16 0940  . HYDROcodone-acetaminophen (NORCO/VICODIN)  5-325 MG per tablet 1 tablet  1 tablet Oral Q4H PRN Jules Husbands, MD   1 tablet at 09/09/16 0941  . HYDROmorphone (DILAUDID) injection 0.5 mg  0.5 mg Intravenous Q4H PRN Olean Ree, MD   0.5 mg at 09/08/16 1009  . HYDROmorphone (DILAUDID) tablet 2 mg  2 mg Oral Q3H PRN Olean Ree, MD   2 mg at 09/09/16 0523  . insulin aspart (novoLOG) injection 0-9 Units  0-9 Units Subcutaneous Q6H Olean Ree, MD   1 Units at 09/06/16 1251  . ipratropium-albuterol (DUONEB) 0.5-2.5 (3) MG/3ML nebulizer solution 3 mL  3 mL Nebulization Q4H PRN Bettey Costa, MD   3 mL at 08/13/16 1149  . LORazepam (ATIVAN) tablet 0.5 mg  0.5 mg Oral Q6H PRN Florene Glen, MD   0.5 mg at 08/30/16 1143  . MEDLINE mouth rinse  15 mL Mouth Rinse BID Bettey Costa, MD   15 mL  at 09/09/16 0941  . megestrol (MEGACE) tablet 40 mg  40 mg Oral Daily Clayburn Pert, MD   40 mg at 09/09/16 0940  . nystatin (MYCOSTATIN) 100000 UNIT/ML suspension 500,000 Units  5 mL Oral QID Olean Ree, MD   500,000 Units at 09/09/16 0941  . ondansetron (ZOFRAN-ODT) disintegrating tablet 4 mg  4 mg Oral Q6H PRN Jules Husbands, MD   4 mg at 08/28/16 1836   Or  . ondansetron (ZOFRAN) injection 4 mg  4 mg Intravenous Q6H PRN Jules Husbands, MD   4 mg at 09/06/16 1034  . pantoprazole (PROTONIX) injection 40 mg  40 mg Intravenous Q12H Wilhelmina Mcardle, MD   40 mg at 09/09/16 1146  . phenol (CHLORASEPTIC) mouth spray 1 spray  1 spray Mouth/Throat PRN Laverle Hobby, MD   1 spray at 08/02/16 1102  . piperacillin-tazobactam (ZOSYN) IVPB 3.375 g  3.375 g Intravenous Q8H Olean Ree, MD   3.375 g at 09/09/16 0551  . polyethylene glycol (MIRALAX / GLYCOLAX) packet 17 g  17 g Oral Daily Olean Ree, MD   17 g at 08/31/16 1628  . potassium chloride SA (K-DUR,KLOR-CON) CR tablet 40 mEq  40 mEq Oral BID Jules Husbands, MD   40 mEq at 09/09/16 0940  . TPN (CLINIMIX-E) Adult   Intravenous Continuous TPN Jules Husbands, MD 40 mL/hr at 09/08/16 1759       Discharge Medications: Please see discharge summary for a list of discharge medications.  Relevant Imaging Results:  Relevant Lab Results:   Additional Information SS# [id number]; 2 chest tubes, wound vac, diet advanced to normal  [name], [place]

## 2016-09-09 NOTE — Progress Notes (Signed)
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   Patient Measurements: Height: 5\' 10"  (177.8 cm) Weight: 112 lb 1.6 oz (50.8 kg) IBW/kg (Calculated) : 68.5 TPN AdjBW (KG): 59.4 Body mass index is 16.08 kg/m.  Assessment:   GI: Soft Diet started 3/29 Endo:  Insulin requirements in the past 24 hours: 0 unit Lytes: Phos slighlty elevated @ 4.6 No supplementation necessary today. Continue KCl 40 mEq PO BID. Renal: ID:  Best Practices: TPN Access: Triple Lumen PICC placed 3/24 TPN start date: 3/24  Plan:   Continue Clinimix E 5/15 @ 40 mL/hr  Continue 20% intralipid @ 20 ml/hr over 12 hours.  Continue SSI Q6hr.  Next BMP to be checked  09/10/16.  Pharmacy will continue to monitor and adjust per consult.   Larene Beach, PharmD, BCPS Clinical Pharmacist 09/09/2016,3:02 PM

## 2016-09-10 LAB — COMPREHENSIVE METABOLIC PANEL
ALBUMIN: 2.6 g/dL — AB (ref 3.5–5.0)
ALK PHOS: 137 U/L — AB (ref 38–126)
ALT: 11 U/L — ABNORMAL LOW (ref 14–54)
ANION GAP: 7 (ref 5–15)
AST: 20 U/L (ref 15–41)
BUN: 13 mg/dL (ref 6–20)
CALCIUM: 8.6 mg/dL — AB (ref 8.9–10.3)
CHLORIDE: 103 mmol/L (ref 101–111)
CO2: 26 mmol/L (ref 22–32)
Creatinine, Ser: 0.4 mg/dL — ABNORMAL LOW (ref 0.44–1.00)
GFR calc Af Amer: >60 mL/min (ref >60)
GFR calc non Af Amer: >60 mL/min (ref >60)
GLUCOSE: 91 mg/dL (ref 65–99)
Potassium: 3.9 mmol/L (ref 3.5–5.1)
SODIUM: 136 mmol/L (ref 135–145)
Total Bilirubin: 0.3 mg/dL (ref 0.3–1.2)
Total Protein: 6.5 g/dL (ref 6.5–8.1)

## 2016-09-10 LAB — CBC
HEMATOCRIT: 33.7 % — AB (ref 35.0–47.0)
HEMOGLOBIN: 11 g/dL — AB (ref 12.0–16.0)
MCH: 26.6 pg (ref 26.0–34.0)
MCHC: 32.6 g/dL (ref 32.0–36.0)
MCV: 81.6 fL (ref 80.0–100.0)
Platelets: 694 10*3/uL — ABNORMAL HIGH (ref 150–440)
RBC: 4.13 MIL/uL (ref 3.80–5.20)
RDW: 22.9 % — AB (ref 11.5–14.5)
WBC: 9.9 10*3/uL (ref 3.6–11.0)

## 2016-09-10 LAB — DIFFERENTIAL
BASOS ABS: 0.1 10*3/uL (ref 0–0.1)
Basophils Relative: 1 %
Eosinophils Absolute: 0.5 10*3/uL (ref 0–0.7)
Eosinophils Relative: 5 %
LYMPHS PCT: 13 %
Lymphs Abs: 1.3 10*3/uL (ref 1.0–3.6)
MONO ABS: 0.7 10*3/uL (ref 0.2–0.9)
MONOS PCT: 8 %
NEUTROS ABS: 7.3 10*3/uL — AB (ref 1.4–6.5)
Neutrophils Relative %: 73 %

## 2016-09-10 LAB — PREALBUMIN: Prealbumin: 16.9 mg/dL — ABNORMAL LOW (ref 18–38)

## 2016-09-10 LAB — GLUCOSE, CAPILLARY
GLUCOSE-CAPILLARY: 92 mg/dL (ref 65–99)
GLUCOSE-CAPILLARY: 95 mg/dL (ref 65–99)
Glucose-Capillary: 94 mg/dL (ref 65–99)
Glucose-Capillary: 114 mg/dL — ABNORMAL HIGH (ref 65–99)

## 2016-09-10 LAB — MAGNESIUM: Magnesium: 1.8 mg/dL (ref 1.7–2.4)

## 2016-09-10 LAB — PHOSPHORUS: PHOSPHORUS: 4.5 mg/dL (ref 2.5–4.6)

## 2016-09-10 LAB — TRIGLYCERIDES: Triglycerides: 83 mg/dL (ref <150)

## 2016-09-10 NOTE — Progress Notes (Signed)
Chaplain made a follow-up visit with Pt. Pt was lying in the bed at the time of this visit. Pt stated she was doing much better today than she did last weekend. Pt was depressed last weekend, but she stated she was not depressed today. Pt told [place], she is eating solid food, her stomach is handling food well, and her physical exercise is going well. Pt was in good spirit today and is hoping that she would be discharged soon. Pt stated she is missing her son and requested prayers for herself and her son. [place] provided prayer support and a ministry of presence.     09/10/16 1500  Clinical Encounter Type  Visited With Patient  Visit Type Follow-up  Referral From Chaplain  Consult/Referral To Chaplain  Spiritual Encounters  Spiritual Needs Prayer;Emotional

## 2016-09-10 NOTE — Progress Notes (Signed)
ANTIBIOTIC/ANTIFUnGAL CONSULT NOTE - FOLLOW UP  Pharmacy Consult for Zosyn  Indication: intra-abdominal infection  No Known Allergies  Patient Measurements: Height: 5\' 10"  (177.8 cm) Weight: 112 lb 6.4 oz (51 kg) IBW/kg (Calculated) : 68.5  Vital Signs: Temp: 97.9 F (36.6 C) (04/09 0437) Temp Source: Oral (04/09 0437) BP: 110/62 (04/09 0437) Pulse Rate: 90 (04/09 0437) Intake/Output from previous day: 04/08 0701 - 04/09 0700 In: 1016 [P.O.:480; I.V.:486; IV Piggyback:50] Out: 340 [Urine:300; Drains:40] Intake/Output from this shift: Total I/O In: -  Out: 680 [Urine:650; Drains:30]  Labs:  Recent Labs  09/10/16 0509  WBC 9.9  HGB 11.0*  PLT 694*  CREATININE 0.40*   Estimated Creatinine Clearance: 68.5 mL/min (A) (by C-G formula based on SCr of 0.4 mg/dL (L)).  Assessment: Pharmacy consulted to dose piperacillin/tazobactam and Eraxis in this 51 year old female with intrabdominal infection post perforated peptic ulcer.   Plan:  Continue Zosyn EI 3.375 g IV q8 hours.  Napoleon Form, PharmD Clinical Pharmacist 09/10/2016,4:04 PM

## 2016-09-10 NOTE — Progress Notes (Signed)
Nutrition Follow-up  DOCUMENTATION CODES:   Severe malnutrition in context of acute illness/injury  INTERVENTION:  Discussed nutrition plan of care with MD. Plan is to discontinue TPN as patient was able to meet 100% of calorie and protein needs with intake of meals and oral nutrition supplements.   Encouraged ongoing intake of adequate calories and protein at meals. Patient still has high-calorie, high-protein handout from earlier in admission and is ordering adequate source of protein and calories at each meal.  Continue Boost Breeze po TID, each supplement provides 250 kcal and 9 grams of protein.   NUTRITION DIAGNOSIS:   Inadequate oral intake related to poor appetite, acute illness as evidenced by per patient/family report, meal completion < 50%.  Improved - patient met 100% of needs.   GOAL:   Patient will meet greater than or equal to 90% of their needs  Met with intake of meals and oral nutrition supplements.   MONITOR:   I & O's, Diet advancement, Labs, Weight trends, Supplement acceptance  REASON FOR ASSESSMENT:   Malnutrition Screening Tool    ASSESSMENT:   Lindsey Wolf is a 51 y.o. female with a 3 days hx of abdominal pain in the epigastric area and she thinks it was a gas type. Pain is moderate to severe in intensity and has worsening today.  -Wound VAC was removed Friday. Patient now with 3 JP drains (two to chest, one to abdomen).  Spoke with patient at bedside. She reports her appetite continues to improve and she is able to order larger meals. Patient reports that she thinks the appetite stimulant she was started on last week is working. Denies N/V or abdominal pain.   Access: PICC triple lumen placed 08/25/2016  TPN: continues at half rate of Clinimix E 5/15 @ 40 ml/hr with 20% ILE @ 20 ml/hr over 12 hours  Meal Completion: 90-100%; Yesterday patient had frosted flakes with whole milk and grapes for breakfast, ham and cheese roll-ups for lunch (4  slices ham, 3 slices cheese), pot roast with gravy and corn for dinner. Patient reports she also had all three bottles of Boost Breeze. Noted in chart she also had all three on Saturday. In the past 24 hours patient has had 1747 kcal (100% minimum estimated kcal needs) and 89 grams of protein (100% minimum estimated protein needs).  Medications reviewed and include: octreotide 100 micrograms Q8hrs, Novolog sliding scale Q6hrs (none required past 24 hrs), Megace, pantoprazole, Zosyn, Miralax, potassium chloride 40 mEq BID.  Labs reviewed: CBG 92-106 past 24 hrs, Creatinine 0.4, Triglycerides 83.  I/O: 40 ml output from drains past 24 hrs, 300 ml urine output plus 4 unmeasured occurrences past 24 hrs  Weight trend: 51 kg on 4/9 (+0.5 kg from last assessment)  Discussed with RN.  Diet Order:  Diet regular Room service appropriate? Yes; Fluid consistency: Thin Diet - low sodium heart healthy .TPN (CLINIMIX-E) Adult  Skin:  Wound (see comment) (stage II pressure ulcer in nare)  Last BM:  09/09/2016  Height:   Ht Readings from Last 1 Encounters:  08/08/16 '5\' 10"'  (1.778 m)    Weight:   Wt Readings from Last 1 Encounters:  09/10/16 112 lb 6.4 oz (51 kg)    Ideal Body Weight:  68.18 kg  BMI:  Body mass index is 16.13 kg/m.  Estimated Nutritional Needs:   Kcal:  1700-1900 (MSJ x 1.3-1.5)  Protein:  85-115 grams (1.5-2 grams/kg)  Fluid:  1.7-1.9 L/day (30-35 ml/kg)  EDUCATION NEEDS:  Education needs addressed (High-Calorie, High-Protein Nutrition Therapy)  Willey Blade, MS, RD, LDN Pager: [phone] After Hours Pager: [phone]

## 2016-09-10 NOTE — Progress Notes (Signed)
[facility] PROGRESS NOTE Date of Admission:  07/20/2016     ID: Lindsey Wolf is a 51 y.o. female with  intrabdominal infection post perforated peptic ulcer Active Problems:   Perforated ulcer ([place])   Abdominal abscess ([place])   Abnormal respirations   Pleural effusion   Respiratory distress   Empyema of left pleural space (HCC)   Protein-calorie malnutrition, severe   Subjective: She is eating a little more. Still some drainage from sites. No fevers.  ROS  Eleven systems are reviewed and negative except per hpi  Medications:  Antibiotics Given (last 72 hours)    Date/Time Action Medication Dose Rate   09/07/16 1533 Given   piperacillin-tazobactam (ZOSYN) IVPB 3.375 g 3.375 g 12.5 mL/hr   09/07/16 2217 Given   piperacillin-tazobactam (ZOSYN) IVPB 3.375 g 3.375 g 12.5 mL/hr   09/08/16 0528 Given   piperacillin-tazobactam (ZOSYN) IVPB 3.375 g 3.375 g 12.5 mL/hr   09/08/16 1316 Given   piperacillin-tazobactam (ZOSYN) IVPB 3.375 g 3.375 g 12.5 mL/hr   09/08/16 2121 Given   piperacillin-tazobactam (ZOSYN) IVPB 3.375 g 3.375 g 12.5 mL/hr   09/09/16 0551 Given   piperacillin-tazobactam (ZOSYN) IVPB 3.375 g 3.375 g 12.5 mL/hr   09/09/16 1419 Given   piperacillin-tazobactam (ZOSYN) IVPB 3.375 g 3.375 g 12.5 mL/hr   09/09/16 2112 Given   piperacillin-tazobactam (ZOSYN) IVPB 3.375 g 3.375 g 12.5 mL/hr   09/10/16 0505 Given   piperacillin-tazobactam (ZOSYN) IVPB 3.375 g 3.375 g 12.5 mL/hr     . dextrose 5 % with octreotide (SANDOSTATIN) 100 mcg  100 mcg Intravenous Q8H  . enoxaparin (LOVENOX) injection  40 mg Subcutaneous Q24H  . feeding supplement  1 Container Oral TID WC  . FLUoxetine  40 mg Oral q morning - 10a  . gabapentin  300 mg Oral TID  . insulin aspart  0-9 Units Subcutaneous Q6H  . mouth rinse  15 mL Mouth Rinse BID  . megestrol  40 mg Oral Daily  . nystatin  5 mL Oral QID  . pantoprazole (PROTONIX) IV  40 mg Intravenous Q12H  .  piperacillin-tazobactam (ZOSYN)  IV  3.375 g Intravenous Q8H  . polyethylene glycol  17 g Oral Daily  . potassium chloride  40 mEq Oral BID    Objective: Vital signs in last 24 hours: Temp:  [97.5 F (36.4 C)-97.9 F (36.6 C)] 97.9 F (36.6 C) (04/09 0437) Pulse Rate:  [90-94] 90 (04/09 0437) Resp:  [18] 18 (04/09 0437) BP: (103-110)/(62-66) 110/62 (04/09 0437) SpO2:  [100 %] 100 % (04/09 0437) Weight:  [51 kg (112 lb 6.4 oz)] 51 kg (112 lb 6.4 oz) (04/09 0209) Constitutional:  NAD HENT: Protivin/AT, PERRLA, no scleral icterus Mouth/Throat: Oropharynx is clear and dry. No oropharyngeal exudate.  Cardiovascular: Normal rate, regular rhythm and normal heart sounds.  Pulmonary/Chest: clear  L chest tube and JP with minimal ss drainage Neck = supple, no nuchal rigidity Abdominal: Soft. abd nondistended, inciision along midline covered She has drain from mid abd with bilious fluid Lymphadenopathy: no cervical adenopathy. No axillary adenopathy Neurological: alert and oriented to person, place, and time.  Skin: Skin is warm and dry. No rash noted. No erythema.  PICC RUE wnl wnl Psychiatric: a normal mood and affect.  behavior is normal.   Lab Results  Recent Labs  09/09/16 0454 09/10/16 0509  WBC  --  9.9  HGB  --  11.0*  HCT  --  33.7*  NA  --  136  K 4.3 3.9  CL  --  103  CO2  --  26  BUN  --  13  CREATININE  --  0.40*    Microbiology: Results for orders placed or performed during the hospital encounter of 07/20/16  [place] rt PCR ([place] only)     Status: None   Collection Time: 07/20/16  1:15 PM  Result Value Ref Range Status   Specimen source GC/Chlam ENDOCERVICAL  Final   Chlamydia Tr NOT DETECTED NOT DETECTED Final   N gonorrhoeae NOT DETECTED NOT DETECTED Final    Comment: (NOTE) 100  This methodology has not been evaluated in pregnant women or in 200  patients with a history of hysterectomy. 300 400  This methodology will not be performed on patients less  than 77  years of age.   Wet prep, genital     Status: Abnormal   Collection Time: 07/20/16  1:15 PM  Result Value Ref Range Status   Yeast Wet Prep HPF POC NONE SEEN NONE SEEN Final   Trich, Wet Prep NONE SEEN NONE SEEN Final   Clue Cells Wet Prep HPF POC NONE SEEN NONE SEEN Final   WBC, Wet Prep HPF POC RARE (A) NONE SEEN Final   Sperm NONE SEEN  Final  Blood culture (routine x 2)     Status: None   Collection Time: 07/20/16  3:07 PM  Result Value Ref Range Status   Specimen Description BLOOD left forearm  Final   Special Requests   Final    BOTTLES DRAWN AEROBIC AND ANAEROBIC AER12ML ANA12ML   Culture NO GROWTH 5 DAYS  Final   Report Status 07/25/2016 FINAL  Final  MRSA PCR Screening     Status: None   Collection Time: 07/20/16  9:29 PM  Result Value Ref Range Status   MRSA by PCR NEGATIVE NEGATIVE Final    Comment:        The GeneXpert MRSA Assay (FDA approved for NASAL specimens only), is one component of a comprehensive MRSA colonization surveillance program. It is not intended to diagnose MRSA infection nor to guide or monitor treatment for MRSA infections.   Aerobic/Anaerobic Culture (surgical/deep wound)     Status: None   Collection Time: 07/25/16  3:00 PM  Result Value Ref Range Status   Specimen Description ABSCESS RIGHT ABDOMEN  Final   Special Requests NONE  Final   Gram Stain   Final    RARE WBC PRESENT, PREDOMINANTLY PMN NO ORGANISMS SEEN    Culture   Final    No growth aerobically or anaerobically. Performed at [facility], [place] [address] [place], [place] [address]    Report Status 07/31/2016 FINAL  Final  Body fluid culture     Status: None   Collection Time: 07/30/16 11:40 AM  Result Value Ref Range Status   Specimen Description PLEURAL  Final   Special Requests NONE  Final   Gram Stain   Final    MODERATE WBC PRESENT, PREDOMINANTLY PMN NO ORGANISMS SEEN    Culture   Final    No growth aerobically or anaerobically. Performed at  [facility], [place] [address], [place], [place] [address]    Report Status 08/03/2016 FINAL  Final  Acid Fast Smear (AFB)     Status: None   Collection Time: 07/30/16 11:40 AM  Result Value Ref Range Status   AFB Specimen Processing Concentration  Final   Acid Fast Smear Negative  Final  Comment: (NOTE) Performed At: [facility] [place], [place] [id number] Lindon Romp MD [id number]:[id number]    Source (AFB) PLEURAL  Final  Aerobic Culture (superficial specimen)     Status: None   Collection Time: 08/06/16  3:34 PM  Result Value Ref Range Status   Specimen Description ABDOMEN  Final   Special Requests Normal  Final   Gram Stain   Final    RARE WBC PRESENT,BOTH PMN AND MONONUCLEAR NO ORGANISMS SEEN Performed at [facility], [address] [place], [place] [address]    Culture RARE ESCHERICHIA COLI  Final   Report Status 08/09/2016 FINAL  Final   Organism ID, Bacteria ESCHERICHIA COLI  Final      Susceptibility   Escherichia coli - MIC*    AMPICILLIN <=2 SENSITIVE Sensitive     CEFAZOLIN <=4 SENSITIVE Sensitive     CEFEPIME <=1 SENSITIVE Sensitive     CEFTAZIDIME <=1 SENSITIVE Sensitive     CEFTRIAXONE <=1 SENSITIVE Sensitive     CIPROFLOXACIN <=0.25 SENSITIVE Sensitive     GENTAMICIN <=1 SENSITIVE Sensitive     IMIPENEM <=0.25 SENSITIVE Sensitive     TRIMETH/SULFA <=20 SENSITIVE Sensitive     AMPICILLIN/SULBACTAM <=2 SENSITIVE Sensitive     PIP/TAZO <=4 SENSITIVE Sensitive     Extended ESBL NEGATIVE Sensitive     * RARE ESCHERICHIA COLI  Fungus Culture With Stain     Status: None   Collection Time: 08/08/16  3:31 PM  Result Value Ref Range Status   Fungus Stain Final report  Final   Fungus (Mycology) Culture Final report  Corrected    Comment: (NOTE) Performed At: [facility] [address] [place], [place] [id number] Lindon Romp MD [id number] CORRECTED ON 04/04 AT 0017: PREVIOUSLY REPORTED AS Preliminary report     Fungal Source ABDOMEN  Final    Comment: ABDOMINAL WALL WOUND  GOES WITH ACC [id number]   Fungus Culture With Stain     Status: None   Collection Time: 08/08/16  3:31 PM  Result Value Ref Range Status   Fungus Stain Final report  Final   Fungus (Mycology) Culture Final report  Final    Comment: (NOTE) Performed At: [facility] [address] [place], [place] [id number] Lindon Romp MD [id number]    Fungal Source ABDOMEN  Final    Comment: ABDOMINAL WALL 2  GOES WITH ACC [id number]   Fungus Culture With Stain     Status: None   Collection Time: 08/08/16  3:31 PM  Result Value Ref Range Status   Fungus Stain Final report  Final   Fungus (Mycology) Culture Final report  Final    Comment: (NOTE) Performed At: [facility] [address] [place], [place] [id number] Lindon Romp MD [id number]    Fungal Source PLEURAL  Final    Comment: RIGHT PLEURAL SPACE  Fungus Culture With Stain     Status: None   Collection Time: 08/08/16  3:31 PM  Result Value Ref Range Status   Fungus Stain Final report  Final   Fungus (Mycology) Culture Final report  Final    Comment: (NOTE) Performed At: [facility] [address] [place], [place] [id number] Lindon Romp MD JG:[id number]    Fungal Source PLEURAL  Final    Comment: LEFT PLEURAL SPACE  Aerobic/Anaerobic Culture (surgical/deep wound)     Status: None   Collection Time: 08/08/16  3:31 PM  Result Value Ref Range Status  Specimen Description WOUND  Final   Special Requests ABDOMINAL WALL WOUND  Final   Gram Stain   Final    ABUNDANT WBC PRESENT, PREDOMINANTLY PMN RARE GRAM POSITIVE COCCI IN PAIRS Performed at [facility], [place] [address] [place], [place] [address]    Culture   Final    FEW ESCHERICHIA COLI RARE ACTINOMYCES ODONTOLYTICUS SUSCEPTIBILITIES PERFORMED ON PREVIOUS CULTURE WITHIN THE LAST 5 DAYS. MIXED ANAEROBIC FLORA PRESENT.  CALL LAB IF FURTHER IID REQUIRED.    Report  Status 08/13/2016 FINAL  Final  Aerobic/Anaerobic Culture (surgical/deep wound)     Status: None   Collection Time: 08/08/16  3:31 PM  Result Value Ref Range Status   Specimen Description WOUND  Final   Special Requests ABDOMINAL WALL WOUND 2  Final   Gram Stain   Final    ABUNDANT WBC PRESENT, PREDOMINANTLY PMN RARE GRAM POSITIVE COCCI IN PAIRS FEW GRAM POSITIVE RODS    Culture   Final    RARE ESCHERICHIA COLI RARE ACTINOMYCES ODONTOLYTICUS FEW BACTEROIDES FRAGILIS RARE BACTEROIDES THETAIOTAOMICRON BETA LACTAMASE POSITIVE FOR BOTH BACTEROIDES SPECIES Performed at [facility], [place] [address] [place], [place] [address]    Report Status 08/13/2016 FINAL  Final   Organism ID, Bacteria ESCHERICHIA COLI  Final      Susceptibility   Escherichia coli - MIC*    AMPICILLIN 4 SENSITIVE Sensitive     CEFAZOLIN <=4 SENSITIVE Sensitive     CEFEPIME <=1 SENSITIVE Sensitive     CEFTAZIDIME <=1 SENSITIVE Sensitive     CEFTRIAXONE <=1 SENSITIVE Sensitive     CIPROFLOXACIN <=0.25 SENSITIVE Sensitive     GENTAMICIN <=1 SENSITIVE Sensitive     IMIPENEM <=0.25 SENSITIVE Sensitive     TRIMETH/SULFA <=20 SENSITIVE Sensitive     AMPICILLIN/SULBACTAM <=2 SENSITIVE Sensitive     PIP/TAZO <=4 SENSITIVE Sensitive     Extended ESBL NEGATIVE Sensitive     * RARE ESCHERICHIA COLI  Aerobic/Anaerobic Culture (surgical/deep wound)     Status: None   Collection Time: 08/08/16  3:31 PM  Result Value Ref Range Status   Specimen Description WOUND  Final   Special Requests RIGHT PLURAL SPACE  Final   Gram Stain   Final    FEW WBC PRESENT,BOTH PMN AND MONONUCLEAR NO ORGANISMS SEEN    Culture   Final    NO GROWTH 5 DAYS Performed at [facility], [address] [place], [place] [address]    Report Status 08/13/2016 FINAL  Final  Aerobic/Anaerobic Culture (surgical/deep wound)     Status: None   Collection Time: 08/08/16  3:31 PM  Result Value Ref Range Status   Specimen Description WOUND   Final   Special Requests LEFT PLERAL SPACE  Final   Gram Stain   Final    FEW WBC PRESENT,BOTH PMN AND MONONUCLEAR NO ORGANISMS SEEN    Culture   Final    MODERATE BACTEROIDES FRAGILIS BETA LACTAMASE POSITIVE Performed at [facility], [place] [address] [place], [place] [address]    Report Status 08/13/2016 FINAL  Final  Fungus Culture With Stain     Status: None   Collection Time: 08/08/16  3:31 PM  Result Value Ref Range Status   Fungus Stain Final report  Final   Fungus (Mycology) Culture Final report  Final    Comment: (NOTE) Performed At: [facility] [address] [place], [place] [id number] Lindon Romp MD [id number]    Fungal Source  PLEURAL  Final    Comment: PLEURAL PEEL  Aerobic/Anaerobic Culture (surgical/deep wound)     Status: None   Collection Time: 08/08/16  3:31 PM  Result Value Ref Range Status   Specimen Description WOUND  Final   Special Requests PLEURAL PEEL LEFT  Final   Gram Stain   Final    FEW WBC PRESENT,BOTH PMN AND MONONUCLEAR NO ORGANISMS SEEN Performed at [facility], [address], [place], [place] [address]    Culture   Final    FEW BACTEROIDES FRAGILIS BETA LACTAMASE POSITIVE RARE CLOSTRIDIUM CADAVERIS    Report Status 08/13/2016 FINAL  Final  Fungus Culture Result     Status: None   Collection Time: 08/08/16  3:31 PM  Result Value Ref Range Status   Result 1 Comment  Final    Comment: (NOTE) KOH/Calcofluor preparation:  no fungus observed. Performed At: [facility] [place], [place] [id number] Lindon Romp MD [id number]   Fungus Culture Result     Status: None   Collection Time: 08/08/16  3:31 PM  Result Value Ref Range Status   Result 1 Comment  Final    Comment: (NOTE) KOH/Calcofluor preparation:  no fungus observed. Performed At: [facility] [place], [place] [id number] Lindon Romp MD [id number]   Fungus Culture Result     Status:  None   Collection Time: 08/08/16  3:31 PM  Result Value Ref Range Status   Result 1 Comment  Final    Comment: (NOTE) KOH/Calcofluor preparation:  no fungus observed. Performed At: [facility] [place], [place] [id number] Lindon Romp MD JK:[id number]   Fungus Culture Result     Status: None   Collection Time: 08/08/16  3:31 PM  Result Value Ref Range Status   Result 1 Comment  Final    Comment: (NOTE) KOH/Calcofluor preparation:  no fungus observed. Performed At: [facility] [place], [place] [id number] Lindon Romp MD KN:[id number]   Fungus Culture Result     Status: None   Collection Time: 08/08/16  3:31 PM  Result Value Ref Range Status   Result 1 Comment  Final    Comment: (NOTE) KOH/Calcofluor preparation:  no fungus observed. Performed At: [facility] [place], [place] [id number] Lindon Romp MD ZH:[id number]   Fungal organism reflex     Status: None   Collection Time: 08/08/16  3:31 PM  Result Value Ref Range Status   Fungal result 1 Candida albicans  Final    Comment: (NOTE) Light growth Performed At: [facility] [place], [place] [id number] Lindon Romp MD [id number]:[id number]   Fungal organism reflex     Status: None   Collection Time: 08/08/16  3:31 PM  Result Value Ref Range Status   Fungal result 1 Comment  Final    Comment: (NOTE) No yeast or mold isolated after 4 weeks. Performed At: [facility] [place], [place] [id number] Lindon Romp MD JS:[id number]   Fungal organism reflex     Status: None   Collection Time: 08/08/16  3:31 PM  Result Value Ref Range Status   Fungal result 1 Comment  Final    Comment: (NOTE) No yeast or mold isolated after 4 weeks. Performed At: [facility] [address] [place], [place] [id number] Lindon Romp MD IN:[id number]   Fungal organism reflex     Status: None  Collection Time: 08/08/16  3:31 PM  Result Value Ref Range Status   Fungal result 1 Comment  Final    Comment: (NOTE) No yeast or mold isolated after 4 weeks. Performed At: [facility] [place], [place] [id number] Lindon Romp MD [id number]   Fungal organism reflex     Status: None   Collection Time: 08/08/16  3:31 PM  Result Value Ref Range Status   Fungal result 1 Comment  Final    Comment: (NOTE) No yeast or mold isolated after 4 weeks. Performed At: [facility] [place], [place] [id number] Lindon Romp MD [id number]:[id number]   Aerobic/Anaerobic Culture (surgical/deep wound)     Status: None   Collection Time: 08/12/16  8:34 AM  Result Value Ref Range Status   Specimen Description WOUND PERITONEAL SITE FOR WOUND VAC DRESSING  Final   Special Requests NONE  Final   Gram Stain   Final    FEW WBC PRESENT, PREDOMINANTLY PMN RARE GRAM VARIABLE COCCI Performed at [facility], [place] [address], [place], [place] [address]    Culture   Final    RARE ESCHERICHIA COLI MIXED ANAEROBIC FLORA PRESENT.  CALL LAB IF FURTHER IID REQUIRED.    Report Status 08/18/2016 FINAL  Final   Organism ID, Bacteria ESCHERICHIA COLI  Final      Susceptibility   Escherichia coli - MIC*    AMPICILLIN >=32 RESISTANT Resistant     CEFAZOLIN >=64 RESISTANT Resistant     CEFEPIME <=1 SENSITIVE Sensitive     CEFTAZIDIME <=1 SENSITIVE Sensitive     CEFTRIAXONE <=1 SENSITIVE Sensitive     CIPROFLOXACIN <=0.25 SENSITIVE Sensitive     GENTAMICIN <=1 SENSITIVE Sensitive     IMIPENEM <=0.25 SENSITIVE Sensitive     TRIMETH/SULFA <=20 SENSITIVE Sensitive     AMPICILLIN/SULBACTAM >=32 RESISTANT Resistant     PIP/TAZO <=4 SENSITIVE Sensitive     Extended ESBL NEGATIVE Sensitive     * RARE ESCHERICHIA COLI  Aerobic/Anaerobic Culture (surgical/deep wound)     Status: None   Collection Time: 08/17/16 12:30 PM  Result Value Ref Range Status   Specimen  Description DRAINAGE  Final   Special Requests Normal  Final   Gram Stain   Final    FEW WBC PRESENT, PREDOMINANTLY PMN RARE GRAM POSITIVE COCCI IN PAIRS    Culture   Final    FEW BACTEROIDES FRAGILIS BETA LACTAMASE POSITIVE Performed at [facility], [address] [place], [place] [address]    Report Status 08/22/2016 FINAL  Final  CULTURE, BLOOD (ROUTINE X 2) w Reflex to ID Panel     Status: None   Collection Time: 08/22/16  1:51 PM  Result Value Ref Range Status   Specimen Description BLOOD RIGHT HAND  Final   Special Requests BOTTLES DRAWN AEROBIC AND ANAEROBIC BCAV  Final   Culture NO GROWTH 5 DAYS  Final   Report Status 08/27/2016 FINAL  Final  CULTURE, BLOOD (ROUTINE X 2) w Reflex to ID Panel     Status: None   Collection Time: 08/23/16  6:05 AM  Result Value Ref Range Status   Specimen Description BLOOD R HAND  Final   Special Requests IN BOTH AEROBIC AND ANAEROBIC BOTTLES BCAV  Final   Culture NO GROWTH 5 DAYS  Final   Report Status 08/28/2016 FINAL  Final  Aerobic/Anaerobic Culture (surgical/deep wound)     Status: None   Collection Time: 08/23/16  3:20 PM  Result Value Ref Range Status   Specimen Description DRAINAGE  Final   Special Requests Normal  Final   Gram Stain   Final    ABUNDANT WBC PRESENT,BOTH PMN AND MONONUCLEAR FEW GRAM POSITIVE COCCI IN PAIRS IN CLUSTERS RARE GRAM VARIABLE ROD Performed at [facility], [place] [address] [place], [place] [address]    Culture   Final    MODERATE ESCHERICHIA COLI MIXED ANAEROBIC FLORA PRESENT.  CALL LAB IF FURTHER IID REQUIRED.    Report Status 08/28/2016 FINAL  Final   Organism ID, Bacteria ESCHERICHIA COLI  Final      Susceptibility   Escherichia coli - MIC*    AMPICILLIN 8 SENSITIVE Sensitive     CEFAZOLIN <=4 SENSITIVE Sensitive     CEFEPIME <=1 SENSITIVE Sensitive     CEFTAZIDIME <=1 SENSITIVE Sensitive     CEFTRIAXONE <=1 SENSITIVE Sensitive     CIPROFLOXACIN <=0.25 SENSITIVE Sensitive      GENTAMICIN <=1 SENSITIVE Sensitive     IMIPENEM <=0.25 SENSITIVE Sensitive     TRIMETH/SULFA <=20 SENSITIVE Sensitive     AMPICILLIN/SULBACTAM <=2 SENSITIVE Sensitive     PIP/TAZO <=4 SENSITIVE Sensitive     Extended ESBL NEGATIVE Sensitive     * MODERATE ESCHERICHIA COLI    Studies/Results: No results found.  Assessment/Plan: Lindsey Wolf is a 51 y.o. female with perforated peptic ulcer, with complicated course since admission with placement of an IR drain and then repeat surgery 2/26. She has been on zosyn since admission and started fluconazole 2/26 - changed to eraxis 3/14.  Multiple drains in place.  Had L empyema noted and s/p decortication with about 500 cc pus aspirated with cx growing bacteroides, and clostridium and candida. Wound cx with E coli, actinomyces. - E coli- sensitive to zosyn.  WBC down 39-> 16.7 after surgery but increased and found to have loculated fluid on L chest  3/16 had repeat drain on L - cx with bacteroides Her R abd JP drain has some purulent drainage 3/19 - wbc decreasing. Remains weak 3/21 wbc up to 30. CT reviewed. [place] done.  3/23- 2 new drains placed by IR 3/22 with purulent drainage - GS with abundant wbc, GPC in pairs and clusters and gram variable rods. Draining some thin bilious material. Also upsize of L chest drain - some purulence in that drain now.Also had to have picc removed as not functioning but replaced 3/28 -  WBC down to 14, no pain.  4/3 - has continued to improve, no fevers, wbc down to 10, JP drains out, Chest tube with heimlich valve, min drainage from chest. Advancing diet and tolerating regular food now. On TPN stil Her last drain was placed 3/22 so she is 13 days s/p hopefully adequate drainage of all abscesses in chest and abd.  09/10/16 - no fevers, wbc remains at 9.9. She has drains still in place, one with bilious fluid and other with thin purulent fluid She is improving steadily but slowly. Actually eating tuna on crackers  when I saw her.  Recommendations Had originally considered stopping abx 4/11 but with drains still in place and continued purulence and biliary drainage will continue - for now plan on stop date 4/18 but will review prior to dc abx If she develops recurrent  fevers or leukocytosis she would need to have repeat CT chest abd and pelvis to monitor for recurrent abscess. She would also need BCX to make sure no picc line infection  given TPN  Thank you very much for the consult. Will follow with you.  Chanteria Haggard P   09/10/2016, 3:06 PM

## 2016-09-10 NOTE — Progress Notes (Signed)
29 Days Post-Op  Subjective: Patient feeling well today she is taking a diet but not eating a lot of her diet she remains on half strength TPN.  Objective: Vital signs in last 24 hours: Temp:  [97.5 F (36.4 C)-98 F (36.7 C)] 97.9 F (36.6 C) (04/09 0437) Pulse Rate:  [90-102] 90 (04/09 0437) Resp:  [18-20] 18 (04/09 0437) BP: (103-110)/(62-67) 110/62 (04/09 0437) SpO2:  [97 %-100 %] 100 % (04/09 0437) Weight:  [112 lb 6.4 oz (51 kg)] 112 lb 6.4 oz (51 kg) (04/09 0209) Last BM Date: 09/09/16  Intake/Output from previous day: 04/08 0701 - 04/09 0700 In: 1016 [P.O.:480; I.V.:486; IV Piggyback:50] Out: 340 [Urine:300; Drains:40] Intake/Output this shift: No intake/output data recorded.  Physical exam:  Awake alert oriented vital signs are stable afebrile abdomen is soft drains are in place with purulence noted but minimal output chest tubes in place as well.  Lab Results: CBC   Recent Labs  09/10/16 0509  WBC 9.9  HGB 11.0*  HCT 33.7*  PLT 694*   BMET  Recent Labs  09/09/16 0454 09/10/16 0509  NA  --  136  K 4.3 3.9  CL  --  103  CO2  --  26  GLUCOSE  --  91  BUN  --  13  CREATININE  --  0.40*  CALCIUM  --  8.6*   PT/INR No results for input(s): LABPROT, INR in the last 72 hours. ABG No results for input(s): PHART, HCO3 in the last 72 hours.  Invalid input(s): PCO2, PO2  Studies/Results: No results found.  Anti-infectives: Anti-infectives    Start     Dose/Rate Route Frequency Ordered Stop   09/08/16 0600  piperacillin-tazobactam (ZOSYN) IVPB 3.375 g     3.375 g 12.5 mL/hr over 240 Minutes Intravenous Every 8 hours 09/07/16 1652     09/07/16 2200  piperacillin-tazobactam (ZOSYN) IVPB 3.375 g     3.375 g 12.5 mL/hr over 240 Minutes Intravenous Every 8 hours 09/07/16 1652 09/08/16 0217   09/05/16 2200  piperacillin-tazobactam (ZOSYN) IVPB 3.375 g  Status:  Discontinued     3.375 g 12.5 mL/hr over 240 Minutes Intravenous Every 8 hours 09/05/16  0925 09/07/16 1652   09/04/16 0000  anidulafungin 100 mg in sodium chloride 0.9 % 100 mL     100 mg over 90 Minutes Intravenous Every 24 hours 09/03/16 2119     09/04/16 0000  piperacillin-tazobactam (ZOSYN) 3.375 GM/50ML IVPB     3.375 g 12.5 mL/hr over 240 Minutes Intravenous Every 8 hours 09/03/16 2119 09/18/16 2359   08/16/16 1500  anidulafungin (ERAXIS) 100 mg in sodium chloride 0.9 % 100 mL IVPB  Status:  Discontinued     100 mg over 90 Minutes Intravenous Every 24 hours 08/15/16 1428 09/05/16 1516   08/15/16 1500  anidulafungin (ERAXIS) 200 mg in sodium chloride 0.9 % 200 mL IVPB     200 mg over 180 Minutes Intravenous  Once 08/15/16 1428 08/15/16 1822   08/15/16 1430  piperacillin-tazobactam (ZOSYN) IVPB 3.375 g     3.375 g 12.5 mL/hr over 240 Minutes Intravenous Every 8 hours 08/15/16 1428 09/05/16 1800   08/15/16 0730  fluconazole (DIFLUCAN) IVPB 400 mg  Status:  Discontinued     400 mg 100 mL/hr over 120 Minutes Intravenous Every 24 hours 08/15/16 0712 08/15/16 1402   08/11/16 0945  ertapenem (INVANZ) 1 g in sodium chloride 0.9 % 50 mL IVPB  Status:  Discontinued     1  g 100 mL/hr over 30 Minutes Intravenous Every 24 hours 08/11/16 0939 08/15/16 1415   08/05/16 1500  vancomycin (VANCOCIN) IVPB 1000 mg/200 mL premix  Status:  Discontinued     1,000 mg 200 mL/hr over 60 Minutes Intravenous Every 12 hours 08/05/16 0753 08/06/16 0850   08/05/16 0800  vancomycin (VANCOCIN) IVPB 1000 mg/200 mL premix     1,000 mg 200 mL/hr over 60 Minutes Intravenous  Once 08/05/16 0753 08/05/16 0923   07/30/16 1815  fluconazole (DIFLUCAN) IVPB 400 mg  Status:  Discontinued     400 mg 100 mL/hr over 120 Minutes Intravenous Every 24 hours 07/30/16 1743 08/13/16 1358   07/30/16 1528  piperacillin-tazobactam (ZOSYN) 3.375 GM/50ML IVPB    Comments:  LEWIS, CINDY: cabinet override      07/30/16 1528 07/30/16 1525   07/21/16 0000  piperacillin-tazobactam (ZOSYN) IVPB 3.375 g  Status:  Discontinued      3.375 g 12.5 mL/hr over 240 Minutes Intravenous Every 8 hours 07/20/16 1809 08/11/16 0939   07/20/16 1430  piperacillin-tazobactam (ZOSYN) IVPB 3.375 g     3.375 g 100 mL/hr over 30 Minutes Intravenous  Once 07/20/16 1424 07/20/16 1603      Assessment/Plan: s/p Procedure(s): ,remove 2  JP drains,add one pennrose   Discussed care with Dr. Faith Rogue. He plans to start backing out some of the patient's chest tubes. Other drains need to remain due to the presence of purulence. She continues on TPN and will be discharged continued with that once she is taking better by mouth's. May consider repeating a CT scan to address continued drainage.  Florene Glen, MD, FACS  09/10/2016

## 2016-09-10 NOTE — Progress Notes (Signed)
OT Cancellation Note  Patient Details Name: Lindsey Wolf MRN: [id number] DOB: 1965-12-20   Cancelled Treatment:    Reason Eval/Treat Not Completed: Patient declined, no reason specified. Upon entry, pt on phone and requested OT come back later for OT treatment session. Will re-attempt OT session at later date/time as schedule permits.  Jeni Salles, MPH, MS, OTR/L ascom [phone] 09/10/16, 3:20 PM

## 2016-09-11 LAB — GLUCOSE, CAPILLARY
Glucose-Capillary: 101 mg/dL — ABNORMAL HIGH (ref 65–99)
Glucose-Capillary: 102 mg/dL — ABNORMAL HIGH (ref 65–99)

## 2016-09-11 MED ORDER — SODIUM CHLORIDE 0.9% FLUSH: 10.0000 mL | INTRAVENOUS | Status: DC | PRN

## 2016-09-11 MED ORDER — OXYCODONE-ACETAMINOPHEN 7.5-325 MG PO TABS
1.0000 | ORAL_TABLET | ORAL | Status: DC | PRN
  Administered 2016-09-11 – 2016-09-14 (×15): 1 via ORAL
  Filled 2016-09-11 (×15): qty 1

## 2016-09-11 NOTE — Progress Notes (Signed)
OT Cancellation Note  Patient Details Name: Lindsey Wolf MRN: [id number] DOB: [birth year]   Cancelled Treatment:    Reason Eval/Treat Not Completed: Patient declined, no reason specified. On 2nd attempt to treat, pt in bed, with RN initiating IV medication. Pt requesting OT come back tomorrow. Will re-attempt OT treatment tomorrow as schedule permits.   Jeni Salles, MPH, MS, OTR/L ascom [phone] 09/11/16, 2:57 PM

## 2016-09-11 NOTE — Progress Notes (Signed)
OT Cancellation Note  Patient Details Name: Lindsey Wolf MRN: [id number] DOB: 09/01/1965   Cancelled Treatment:    Reason Eval/Treat Not Completed: Patient declined, no reason specified. Pt eating lunch upon OT's entry into room, requesting OT come back after so she can complete her meal while it is warm and ensure she is focused on meeting her caloric targets to help her gain weight. Will re-attempt OT treatment at later time.   Jeni Salles, MPH, MS, OTR/L ascom [phone] 09/11/16, 2:01 PM

## 2016-09-11 NOTE — Progress Notes (Signed)
qPhysical Therapy Treatment Patient Details Name: Lindsey Wolf MRN: [id number] DOB: July 20, 1965 Today's Date: 09/11/2016    History of Present Illness Pt is a 51 year old female status post complicated repair of peptic ulcer disease. She later underwent a repeat laparotomy for drainage of intra-abdominal abscesses. Pt diagnosed with Acute hypoxic respiratory failure with L chest tube placement, septic shock with hypotension, and A-fib with RVR.  Pt also has multiple JP drains and was intubated but is now extubated. Pt underwent L thoracotomy and thoracoscopy along with debridement of abdominal wall abscess on 08/08/16.  Pt now with B chest tubes, JP drains, and wound vac. Of note, small pneumothorax per chart review.  Patient underwent wound vac placement and debridement of abd wound 08/12/16. Pt has had many of her drains/lines removed and generally has been feeling much better.    PT Comments    Pt continues to feel better each day, she has been able to eat some and has had more energy. Pain much less limiting that is has been and she has been able to walk daily with nursing.  Today we went for a more longed walk (1500 ft) with only very minimal cuing or assist.  She did have some fatigue (with O2 dropping and HR increasing) but she was able to continue and was safe the entire time. Pt enjoyed doing exercises with considerable resistance and did perform them well.  Pt continues to do well.   Follow Up Recommendations  Home health PT     Equipment Recommendations  None recommended by PT    Recommendations for Other Services       Precautions / Restrictions Precautions Precautions:  (mod fall risk) Restrictions Weight Bearing Restrictions: No    Mobility  Bed Mobility Overal bed mobility: Independent Bed Mobility: Supine to Sit;Sit to Supine Rolling: Independent   Supine to sit: Independent Sit to supine: Independent   General bed mobility comments: Pt did well getting in/out of  bed, not assist needed only minimal hesitation with core engagement  Transfers Overall transfer level: Independent Equipment used: None Transfers: Sit to/from Stand Sit to Stand: Modified independent (Device/Increase time)         General transfer comment: Pt able to rise to standing w/o assist and showed good confidence and safety  Ambulation/Gait Ambulation/Gait assistance: Modified independent (Device/Increase time) Ambulation Distance (Feet): 1500 Feet Assistive device: None       General Gait Details: Pt able to do prolonged ambulation with good safety and confidence.  She did have some fatigue with P2 dropping to the low 90s and HR increasing to the 120s, but overall she was safe, had community appropriate speed and ultimately did well.    Stairs            Wheelchair Mobility    Modified Rankin (Stroke Patients Only)       Balance Overall balance assessment: Modified Independent                                          Cognition Arousal/Alertness: Awake/alert Behavior During Therapy: WFL for tasks assessed/performed Overall Cognitive Status: Within Functional Limits for tasks assessed                                        Exercises General Exercises -  Lower Extremity Ankle Circles/Pumps: AROM;10 reps Short Arc Quad: Strengthening;10 reps;Both Heel Slides: Strengthening;10 reps;Both Hip ABduction/ADduction: Strengthening;Both;10 reps Straight Leg Raises: Strengthening;Both;10 reps;Standing Hip Flexion/Marching: Strengthening;Both;10 reps;Standing    General Comments        Pertinent Vitals/Pain Pain Assessment:  (only minimal general pain)    Home Living                      Prior Function            PT Goals (current goals can now be found in the care plan section) Acute Rehab PT Goals Patient Stated Goal: go home Progress towards PT goals: Progressing toward goals    Frequency    Min  2X/week      PT Plan Current plan remains appropriate    Co-evaluation             End of Session Equipment Utilized During Treatment: Gait belt Activity Tolerance: Patient limited by fatigue;Patient tolerated treatment well Patient left: in bed;with call bell/phone within reach   PT Visit Diagnosis: Muscle weakness (generalized) (M62.81);Difficulty in walking, not elsewhere classified (R26.2)     Time: 9417-4081 PT Time Calculation (min) (ACUTE ONLY): 27 min  Charges:  $Gait Training: 8-22 mins $Therapeutic Exercise: 8-22 mins                    G Codes:       Kreg Shropshire, DPT 09/11/2016, 3:44 PM

## 2016-09-11 NOTE — Plan of Care (Signed)
Problem: Activity: Goal: Mobility will improve Outcome: Progressing Patient ambulated 480 ft in hallway.  Problem: Respiratory: Goal: Chest tube patency will be maintained Outcome: Not Applicable Date Met: 83/35/82 Chest tube removed.

## 2016-09-11 NOTE — Progress Notes (Signed)
30 Days Post-Op  Subjective: Patient discussed with me her desires to change her pain medication regimen she prefers the IV and that the oral Vicodin is not working for her and has never worked for her. She is tolerating a regular diet and TPN has been discontinued. Infectious diseases has suggested continuing IV antibiotics  Objective: Vital signs in last 24 hours: Temp:  [97.6 F (36.4 C)-98 F (36.7 C)] 98 F (36.7 C) (04/10 0554) Pulse Rate:  [86-97] 86 (04/10 0559) Resp:  [16] 16 (04/10 0554) BP: (103-111)/(61-64) 111/64 (04/10 0554) SpO2:  [93 %-99 %] 93 % (04/10 0559) Weight:  [111 lb 11.2 oz (50.7 kg)] 111 lb 11.2 oz (50.7 kg) (04/10 0557) Last BM Date: 09/11/16  Intake/Output from previous day: 04/09 0701 - 04/10 0700 In: 1080 [I.V.:930; IV Piggyback:150] Out: 1452 [Urine:1350; Drains:102] Intake/Output this shift: Total I/O In: -  Out: 200 [Urine:200]  Physical exam:  Abdomen soft nontender nondistended wound is dressed purulence and 2 drains.  Lab Results: CBC   Recent Labs  09/10/16 0509  WBC 9.9  HGB 11.0*  HCT 33.7*  PLT 694*   BMET  Recent Labs  09/09/16 0454 09/10/16 0509  NA  --  136  K 4.3 3.9  CL  --  103  CO2  --  26  GLUCOSE  --  91  BUN  --  13  CREATININE  --  0.40*  CALCIUM  --  8.6*   PT/INR No results for input(s): LABPROT, INR in the last 72 hours. ABG No results for input(s): PHART, HCO3 in the last 72 hours.  Invalid input(s): PCO2, PO2  Studies/Results: No results found.  Anti-infectives: Anti-infectives    Start     Dose/Rate Route Frequency Ordered Stop   09/08/16 0600  piperacillin-tazobactam (ZOSYN) IVPB 3.375 g     3.375 g 12.5 mL/hr over 240 Minutes Intravenous Every 8 hours 09/07/16 1652     09/07/16 2200  piperacillin-tazobactam (ZOSYN) IVPB 3.375 g     3.375 g 12.5 mL/hr over 240 Minutes Intravenous Every 8 hours 09/07/16 1652 09/08/16 0217   09/05/16 2200  piperacillin-tazobactam (ZOSYN) IVPB 3.375 g   Status:  Discontinued     3.375 g 12.5 mL/hr over 240 Minutes Intravenous Every 8 hours 09/05/16 0925 09/07/16 1652   09/04/16 0000  anidulafungin 100 mg in sodium chloride 0.9 % 100 mL     100 mg over 90 Minutes Intravenous Every 24 hours 09/03/16 2119     09/04/16 0000  piperacillin-tazobactam (ZOSYN) 3.375 GM/50ML IVPB     3.375 g 12.5 mL/hr over 240 Minutes Intravenous Every 8 hours 09/03/16 2119 09/18/16 2359   08/16/16 1500  anidulafungin (ERAXIS) 100 mg in sodium chloride 0.9 % 100 mL IVPB  Status:  Discontinued     100 mg over 90 Minutes Intravenous Every 24 hours 08/15/16 1428 09/05/16 1516   08/15/16 1500  anidulafungin (ERAXIS) 200 mg in sodium chloride 0.9 % 200 mL IVPB     200 mg over 180 Minutes Intravenous  Once 08/15/16 1428 08/15/16 1822   08/15/16 1430  piperacillin-tazobactam (ZOSYN) IVPB 3.375 g     3.375 g 12.5 mL/hr over 240 Minutes Intravenous Every 8 hours 08/15/16 1428 09/05/16 1800   08/15/16 0730  fluconazole (DIFLUCAN) IVPB 400 mg  Status:  Discontinued     400 mg 100 mL/hr over 120 Minutes Intravenous Every 24 hours 08/15/16 0712 08/15/16 1402   08/11/16 0945  ertapenem (INVANZ) 1 g in sodium chloride  0.9 % 50 mL IVPB  Status:  Discontinued     1 g 100 mL/hr over 30 Minutes Intravenous Every 24 hours 08/11/16 0939 08/15/16 1415   08/05/16 1500  vancomycin (VANCOCIN) IVPB 1000 mg/200 mL premix  Status:  Discontinued     1,000 mg 200 mL/hr over 60 Minutes Intravenous Every 12 hours 08/05/16 0753 08/06/16 0850   08/05/16 0800  vancomycin (VANCOCIN) IVPB 1000 mg/200 mL premix     1,000 mg 200 mL/hr over 60 Minutes Intravenous  Once 08/05/16 0753 08/05/16 0923   07/30/16 1815  fluconazole (DIFLUCAN) IVPB 400 mg  Status:  Discontinued     400 mg 100 mL/hr over 120 Minutes Intravenous Every 24 hours 07/30/16 1743 08/13/16 1358   07/30/16 1528  piperacillin-tazobactam (ZOSYN) 3.375 GM/50ML IVPB    Comments:  LEWIS, CINDY: cabinet override      07/30/16 1528  07/30/16 1525   07/21/16 0000  piperacillin-tazobactam (ZOSYN) IVPB 3.375 g  Status:  Discontinued     3.375 g 12.5 mL/hr over 240 Minutes Intravenous Every 8 hours 07/20/16 1809 08/11/16 0939   07/20/16 1430  piperacillin-tazobactam (ZOSYN) IVPB 3.375 g     3.375 g 100 mL/hr over 30 Minutes Intravenous  Once 07/20/16 1424 07/20/16 1603      Assessment/Plan: s/p Procedure(s): ,remove 2  JP drains,add one pennrose   Discussed with Dr. Faith Rogue. Wound is healing well. Patient doing much better on oral feeds only. She is getting supplements. Her albumen is coming up slowly. Continued IV antibiotic needs per ID. Placement soon.  Florene Glen, MD, FACS  09/11/2016

## 2016-09-12 NOTE — Progress Notes (Signed)
She is steadily making progress with her ambulation and appetite. I advanced her 3 chest tubes today and resecured them. I redressed her midline abdominal wound. I instructed the nursing staff to pack the inferior portion of the wound with iodoform gauze.  They look excellent overall.

## 2016-09-12 NOTE — Clinical Social Work Note (Signed)
[facility] stated that they needed more clinical information which CSW was able to provide in order to get reauth by [place]. Shela Leff MSW,LCSW [phone]

## 2016-09-12 NOTE — Progress Notes (Signed)
OT Cancellation Note  Patient Details Name: VALINE DROZDOWSKI MRN: [id number] DOB: 12-31-1965   Cancelled Treatment:    Reason Eval/Treat Not Completed: Patient declined, no reason specified. Upon OT's arrival, pt on the phone and requesting OT to come back at later time for session. Will re-attempt in the afternoon, schedule permitting.   Jeni Salles, MPH, MS, OTR/L ascom [phone] 09/12/16, 11:37 AM

## 2016-09-12 NOTE — Progress Notes (Signed)
Chaplain made a follow up visit with this Pt. Pt was on phone when the [facility] visited. Pt asked [place] to visit her later. CH to visit Pt at another time.     09/12/16 1400  Clinical Encounter Type  Visited With Patient  Visit Type Follow-up  Referral From Chaplain  Consult/Referral To Chaplain  Spiritual Encounters  Spiritual Needs Prayer;Emotional

## 2016-09-12 NOTE — Progress Notes (Signed)
31 Days Post-Op  Subjective: Tolerating a diet and ambulating quite well. Her drains had been tacked out from her chest.  Objective: Vital signs in last 24 hours: Temp:  [97.8 F (36.6 C)-98.7 F (37.1 C)] 98.2 F (36.8 C) (04/11 0534) Pulse Rate:  [82-102] 82 (04/11 0534) Resp:  [15-17] 17 (04/11 0534) BP: (95-106)/(61-63) 102/61 (04/11 0534) SpO2:  [91 %-100 %] 91 % (04/11 0534) Weight:  [112 lb 14.4 oz (51.2 kg)] 112 lb 14.4 oz (51.2 kg) (04/11 0500) Last BM Date: 09/11/16  Intake/Output from previous day: 04/10 0701 - 04/11 0700 In: 600 [P.O.:500; IV Piggyback:100] Out: 720 [Urine:700; Drains:20] Intake/Output this shift: No intake/output data recorded.  Physical exam:  Chest left chest tube drains. Multiple abdominal drains and retroperitoneal drains 2 with purulence still present nontender calves wound is dressed  Lab Results: CBC   Recent Labs  09/10/16 0509  WBC 9.9  HGB 11.0*  HCT 33.7*  PLT 694*   BMET  Recent Labs  09/10/16 0509  NA 136  K 3.9  CL 103  CO2 26  GLUCOSE 91  BUN 13  CREATININE 0.40*  CALCIUM 8.6*   PT/INR No results for input(s): LABPROT, INR in the last 72 hours. ABG No results for input(s): PHART, HCO3 in the last 72 hours.  Invalid input(s): PCO2, PO2  Studies/Results: No results found.  Anti-infectives: Anti-infectives    Start     Dose/Rate Route Frequency Ordered Stop   09/08/16 0600  piperacillin-tazobactam (ZOSYN) IVPB 3.375 g     3.375 g 12.5 mL/hr over 240 Minutes Intravenous Every 8 hours 09/07/16 1652     09/07/16 2200  piperacillin-tazobactam (ZOSYN) IVPB 3.375 g     3.375 g 12.5 mL/hr over 240 Minutes Intravenous Every 8 hours 09/07/16 1652 09/08/16 0217   09/05/16 2200  piperacillin-tazobactam (ZOSYN) IVPB 3.375 g  Status:  Discontinued     3.375 g 12.5 mL/hr over 240 Minutes Intravenous Every 8 hours 09/05/16 0925 09/07/16 1652   09/04/16 0000  anidulafungin 100 mg in sodium chloride 0.9 % 100 mL      100 mg over 90 Minutes Intravenous Every 24 hours 09/03/16 2119     09/04/16 0000  piperacillin-tazobactam (ZOSYN) 3.375 GM/50ML IVPB     3.375 g 12.5 mL/hr over 240 Minutes Intravenous Every 8 hours 09/03/16 2119 09/18/16 2359   08/16/16 1500  anidulafungin (ERAXIS) 100 mg in sodium chloride 0.9 % 100 mL IVPB  Status:  Discontinued     100 mg over 90 Minutes Intravenous Every 24 hours 08/15/16 1428 09/05/16 1516   08/15/16 1500  anidulafungin (ERAXIS) 200 mg in sodium chloride 0.9 % 200 mL IVPB     200 mg over 180 Minutes Intravenous  Once 08/15/16 1428 08/15/16 1822   08/15/16 1430  piperacillin-tazobactam (ZOSYN) IVPB 3.375 g     3.375 g 12.5 mL/hr over 240 Minutes Intravenous Every 8 hours 08/15/16 1428 09/05/16 1800   08/15/16 0730  fluconazole (DIFLUCAN) IVPB 400 mg  Status:  Discontinued     400 mg 100 mL/hr over 120 Minutes Intravenous Every 24 hours 08/15/16 0712 08/15/16 1402   08/11/16 0945  ertapenem (INVANZ) 1 g in sodium chloride 0.9 % 50 mL IVPB  Status:  Discontinued     1 g 100 mL/hr over 30 Minutes Intravenous Every 24 hours 08/11/16 0939 08/15/16 1415   08/05/16 1500  vancomycin (VANCOCIN) IVPB 1000 mg/200 mL premix  Status:  Discontinued     1,000 mg 200 mL/hr  over 60 Minutes Intravenous Every 12 hours 08/05/16 0753 08/06/16 0850   08/05/16 0800  vancomycin (VANCOCIN) IVPB 1000 mg/200 mL premix     1,000 mg 200 mL/hr over 60 Minutes Intravenous  Once 08/05/16 0753 08/05/16 0923   07/30/16 1815  fluconazole (DIFLUCAN) IVPB 400 mg  Status:  Discontinued     400 mg 100 mL/hr over 120 Minutes Intravenous Every 24 hours 07/30/16 1743 08/13/16 1358   07/30/16 1528  piperacillin-tazobactam (ZOSYN) 3.375 GM/50ML IVPB    Comments:  LEWIS, CINDY: cabinet override      07/30/16 1528 07/30/16 1525   07/21/16 0000  piperacillin-tazobactam (ZOSYN) IVPB 3.375 g  Status:  Discontinued     3.375 g 12.5 mL/hr over 240 Minutes Intravenous Every 8 hours 07/20/16 1809 08/11/16 0939    07/20/16 1430  piperacillin-tazobactam (ZOSYN) IVPB 3.375 g     3.375 g 100 mL/hr over 30 Minutes Intravenous  Once 07/20/16 1424 07/20/16 1603      Assessment/Plan: s/p Procedure(s): ,remove 2  JP drains,add one pennrose   Patient is doing quite well but continues with pus and her abdominal and retroperitoneal drains therefore the drains need to stay in place and her antibiotics need to be continued. Dr. Faith Rogue is backing out her chest tube drains. She is overall improved quite well and does not want placement she wants to go home at this point. I would continue to assess her needs and consider discharge in the next few days on IV antibiotics. 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  Florene Glen, MD, FACS  09/12/2016

## 2016-09-12 NOTE — Clinical Social Work Note (Signed)
CSW spoke at length with patient this afternoon. She is a Engineer, technical sales and was working on some of her clients' taxes in her hospital room. Patient's friend, Lattie Haw, was visiting and patient gave CSW permission to speak to her in front of [place]. CSW spoke with patient regarding discharge disposition and patient stated that she wants to discharge home when time with home health. Patient states that her friend, Lattie Haw, can stay with her and learn how to do the chest tube care and intravenous antibiotics. Lattie Haw confirmed that she was willing to do this. Patient states that she needs and wants to get back to working and would feel much better at home. CSW to inform RN CM. Shela Leff MSW,LCSW [phone]

## 2016-09-13 LAB — COMPREHENSIVE METABOLIC PANEL
ALK PHOS: 126 U/L (ref 38–126)
ALT: 11 U/L — AB (ref 14–54)
AST: 19 U/L (ref 15–41)
Albumin: 2.6 g/dL — ABNORMAL LOW (ref 3.5–5.0)
Anion gap: 7 (ref 5–15)
BILIRUBIN TOTAL: 0.4 mg/dL (ref 0.3–1.2)
BUN: 11 mg/dL (ref 6–20)
CO2: 24 mmol/L (ref 22–32)
Calcium: 9.1 mg/dL (ref 8.9–10.3)
Chloride: 107 mmol/L (ref 101–111)
Creatinine, Ser: 0.38 mg/dL — ABNORMAL LOW (ref 0.44–1.00)
GFR calc Af Amer: >60 mL/min (ref >60)
Glucose, Bld: 85 mg/dL (ref 65–99)
Potassium: 4.1 mmol/L (ref 3.5–5.1)
Sodium: 138 mmol/L (ref 135–145)
TOTAL PROTEIN: 6.2 g/dL — AB (ref 6.5–8.1)

## 2016-09-13 LAB — CBC
HCT: 34 % — ABNORMAL LOW (ref 35.0–47.0)
Hemoglobin: 10.7 g/dL — ABNORMAL LOW (ref 12.0–16.0)
MCH: 26 pg (ref 26.0–34.0)
MCHC: 31.5 g/dL — AB (ref 32.0–36.0)
MCV: 82.6 fL (ref 80.0–100.0)
Platelets: 716 10*3/uL — ABNORMAL HIGH (ref 150–440)
RBC: 4.12 MIL/uL (ref 3.80–5.20)
RDW: 22.3 % — ABNORMAL HIGH (ref 11.5–14.5)
WBC: 6.9 10*3/uL (ref 3.6–11.0)

## 2016-09-13 LAB — MAGNESIUM: MAGNESIUM: 1.5 mg/dL — AB (ref 1.7–2.4)

## 2016-09-13 LAB — PHOSPHORUS: Phosphorus: 5.3 mg/dL — ABNORMAL HIGH (ref 2.5–4.6)

## 2016-09-13 LAB — ACID FAST CULTURE WITH REFLEXED SENSITIVITIES (MYCOBACTERIA): Acid Fast Culture: NEGATIVE

## 2016-09-13 MED ORDER — MAGNESIUM SULFATE 4 GM/100ML IV SOLN
4.0000 g | Freq: Once | INTRAVENOUS | Status: AC
  Administered 2016-09-13: 4 g via INTRAVENOUS
  Filled 2016-09-13: qty 100

## 2016-09-13 MED ORDER — MAGNESIUM SULFATE 50 % IJ SOLN
3.0000 g | Freq: Once | INTRAVENOUS | Status: DC
  Filled 2016-09-13: qty 6

## 2016-09-13 NOTE — Care Management (Signed)
Anticipate discharge home tomorrow with home health.  Patient has selected [facility] for home health Agency, and Corene Cornea with [facility] is aware of referral.  Plan to discharge with wound care, chest tube care, home IV antibiotics, and PT.  MD has entered order for home IV antibiotics.  Patient states that her friend Lattie Haw will be staying with her after discharge for support and to aide with administering IV antibiotics and dressing changes.  RNCM following.

## 2016-09-13 NOTE — Progress Notes (Signed)
Infectious Disease Long Term IV Antibiotic Orders  Diagnosis: Intrabdominal abscess  Culture results Mixed   Allergies: No Known Allergies Lab Results  Component Value Date   WBC 6.9 09/13/2016   HGB 10.7 (L) 09/13/2016   HCT 34.0 (L) 09/13/2016   MCV 82.6 09/13/2016   PLT 716 (H) 09/13/2016   Lab Results  Component Value Date   CREATININE 0.38 (L) 09/13/2016   Discharge antibiotics Zosyn  3.375 grams every 8 hours  PICC Care per protocol Labs weekly while on IV antibiotics   -FAX weekly labs to [phone]  CBC w diff   Comprehensive met panel  Planned duration of antibiotics 10 days   Stop date 4/22  Follow up clinic date 1 week   FAX weekly labs to [phone]  Leonel Ramsay, MD

## 2016-09-13 NOTE — Progress Notes (Signed)
Nutrition Follow-up  DOCUMENTATION CODES:   Severe malnutrition in context of acute illness/injury  INTERVENTION:  Continue Boost Breeze po TID, each supplement provides 250 kcal and 9 grams of protein.   Provided patient with post-discharge nutrition instructions. Encouraged her to continue drinking Boost Breeze or other similar oral nutrition supplement for at least one month after discharge or until she is able to maintain her weight with only PO intake of meals and snacks.  Provided patient with recipe that can be used with Boost Breeze drink and information on how to obtain coupons.  NUTRITION DIAGNOSIS:   Inadequate oral intake related to poor appetite, acute illness as evidenced by per patient/family report, meal completion < 50%.  Resolved - Patient now regularly meeting 100% of calorie and protein needs.  GOAL:   Patient will meet greater than or equal to 90% of their needs  Met.  MONITOR:   I & O's, Diet advancement, Labs, Weight trends, Supplement acceptance  REASON FOR ASSESSMENT:   Malnutrition Screening Tool    ASSESSMENT:   Lindsey Wolf is a 51 y.o. female with a 3 days hx of abdominal pain in the epigastric area and she thinks it was a gas type. Pain is moderate to severe in intensity and has worsening today.  Patient reports her appetite continue to improve and she is able to regularly finish 100% of everything she orders now. Denies any N/V or abdominal pain. Reports still having regular bowel movements.  Meal Completion: 100% of meals + 3 bottles Boost Breeze In the past 24 hours patient has had approximately 1891 kcal (100% estimated kcal needs) and 92 grams of protein (100% estimated protein needs).   Medications reviewed and include: magnesium sulfate 4 grams once today, Megace 40 mg daily, pantoprazole, Zosyn, Miralax, potassium chloride 40 mEq BID, Dilaudid PRN, Percocet PRN.  Labs reviewed: CBG 94-114 past 24 hrs, Creatinine 0.38, Phosphorus  5.3, Magnesium 1.5.   Weight trend: 50.4 kg 4/12 (-0.6 kg from last assessment - fairly stable)  I/O: 1.5 L UOP (1.2 ml/kg/hr) past 24 hrs, 62 ml output from drains past 24 hrs  Diet Order:  Diet regular Room service appropriate? Yes; Fluid consistency: Thin Diet - low sodium heart healthy  Skin:  Wound (see comment) (stage II pressure ulcer in nare)  Last BM:  09/11/2016  Height:   Ht Readings from Last 1 Encounters:  08/08/16 _0  (1.778 m)    Weight:   Wt Readings from Last 1 Encounters:  09/13/16 111 lb 3.2 oz (50.4 kg)    Ideal Body Weight:  68.18 kg  BMI:  Body mass index is 15.96 kg/m.  Estimated Nutritional Needs:   Kcal:  1700-1900 (MSJ x 1.3-1.5)  Protein:  85-115 grams (1.5-2 grams/kg)  Fluid:  1.7-1.9 L/day (30-35 ml/kg)  EDUCATION NEEDS:   Education needs addressed (High-Calorie, High-Protein Nutrition Therapy)  Willey Blade, MS, RD, LDN Pager: [phone] After Hours Pager: [phone]

## 2016-09-13 NOTE — Progress Notes (Signed)
Occupational Therapy Treatment Patient Details Name: Lindsey Wolf MRN: [id number] DOB: 1966/03/14 Today's Date: 09/13/2016    History of present illness Pt is a 51 year old female status post complicated repair of peptic ulcer disease. She later underwent a repeat laparotomy for drainage of intra-abdominal abscesses. Pt diagnosed with Acute hypoxic respiratory failure with L chest tube placement, septic shock with hypotension, and A-fib with RVR.  Pt also has multiple JP drains and was intubated but is now extubated. Pt underwent L thoracotomy and thoracoscopy along with debridement of abdominal wall abscess on 08/08/16.  Pt now with B chest tubes, JP drains, and wound vac. Of note, small pneumothorax per chart review.  Patient underwent wound vac placement and debridement of abd wound 08/12/16. Pt has had many of her drains/lines removed and generally has been feeling much better.   OT comments  Pt agreeable to OT session focused on self care tasks. Pt demonstrated ability to perform all bed mobility, functional transfers including on/off regular height toilet and dressing tasks at modified independent level, ensuring safety and being cautious of drains to minimize tugging/pulling and discomfort. Pt reports feeling better and very eager to return home after extended hospitalization. Pt still limited somewhat by decreased activity tolerance, however, demonstrates good safety awareness and awareness of limitations to activity tolerance. Pt is eager to gain weight, improve activity tolerance and get back to PLOF. Pt has met all acute care OT goals and all education/training has been provided. OT will sign off. Pt continues to benefit from skilled [facility] services upon discharge from hospital to address decreased activity tolerance and strength in order to maximize safe return to PLOF and minimize falls risk.   Follow Up Recommendations  Home health OT    Equipment Recommendations  3 in 1 bedside commode     Recommendations for Other Services      Precautions / Restrictions Precautions Precautions:  (mod fall risk) Restrictions Weight Bearing Restrictions: No       Mobility Bed Mobility Overal bed mobility: Independent Bed Mobility: Supine to Sit;Sit to Supine     Supine to sit: Independent Sit to supine: Independent   General bed mobility comments: independent, good safety awareness of drains during movement to minimize tugging/pulling  Transfers Overall transfer level: Modified independent Equipment used: None Transfers: Sit to/from Stand Sit to Stand: Modified independent (Device/Increase time)         General transfer comment: no assist required, able to stand with good confidence and safety awareness    Balance Overall balance assessment: Modified Independent Sitting-balance support: Feet supported;No upper extremity supported Sitting balance-Leahy Scale: Good     Standing balance support: During functional activity;No upper extremity supported Standing balance-Leahy Scale: Good                             ADL either performed or assessed with clinical judgement   ADL Overall ADL's : Modified independent     Grooming: Standing;Wash/dry hands;Wash/dry face;Oral care;Modified independent Grooming Details (indicate cue type and reason): able to stand at the sink to perform grooming tasks with good balance and awareness of activity tolerance Upper Body Bathing: Sitting;Modified independent   Lower Body Bathing: Sit to/from stand;Modified independent;Sitting/lateral leans   Upper Body Dressing : Independent;Sitting   Lower Body Dressing: Modified independent;Sit to/from stand;Sitting/lateral leans Lower Body Dressing Details (indicate cue type and reason): pt able to don underwear, pants, socks, and shoes without physical assist while seated and taking  time and caution to not disturb drains Toilet Transfer: Modified Production designer, theatre/television/film Details (indicate cue type and reason): good safety awareness, no LOB Toileting- Clothing Manipulation and Hygiene: Independent;Sitting/lateral lean       Functional mobility during ADLs: Modified independent General ADL Comments: Pt generally modified indep with all ADL. Only limited by discomfort from drains and decreased activity tolerance, however demonstrates good safety awareness and aware of her activity tolerance limitations so doesn't overdo things     Vision Baseline Vision/History: No visual deficits Patient Visual Report: No change from baseline Vision Assessment?: No apparent visual deficits   Perception     Praxis Praxis Praxis tested?: Within functional limits    Cognition Arousal/Alertness: Awake/alert Behavior During Therapy: WFL for tasks assessed/performed Overall Cognitive Status: Within Functional Limits for tasks assessed                                          Exercises     Shoulder Instructions       General Comments      Pertinent Vitals/ Pain       Pain Assessment:  (minimal discomfort per pt report where drains are inserted) Pain Intervention(s): Limited activity within patient's tolerance;Monitored during session;RN gave pain meds during session  Home Living                                          Prior Functioning/Environment              Frequency           Progress Toward Goals  OT Goals(current goals can now be found in the care plan section)  Progress towards OT goals: Goals met/education completed, patient discharged from OT  Acute Rehab OT Goals Patient Stated Goal: go home OT Goal Formulation: With patient Potential to Achieve Goals: Good  Plan Discharge plan needs to be updated;All goals met and education completed, patient discharged from OT services    Co-evaluation                 End of Session    OT Visit Diagnosis: Muscle weakness  (generalized) (M62.81);Other abnormalities of gait and mobility (R26.89);Pain (pain in abdomen where drains are)   Activity Tolerance Patient tolerated treatment well   Patient Left in bed;with call bell/phone within reach;with nursing/sitter in room   Nurse Communication          Time: 2800-3491 OT Time Calculation (min): 18 min  Charges: OT General Charges $OT Visit: 1 Procedure OT Treatments $Self Care/Home Management : 8-22 mins  Jeni Salles, MPH, MS, OTR/L ascom [phone] 09/13/16, 3:34 PM

## 2016-09-13 NOTE — Progress Notes (Signed)
32 Days Post-Op  Subjective: Patient feels well and wants to go home. She has had some increased bilious output from one of her drains. Purulence remains in the other drains  Objective: Vital signs in last 24 hours: Temp:  [97.4 F (36.3 C)-98.1 F (36.7 C)] 98.1 F (36.7 C) (04/12 1211) Pulse Rate:  [85-94] 88 (04/12 1211) Resp:  [18-22] 18 (04/12 1211) BP: (108-119)/(64-72) 111/64 (04/12 1211) SpO2:  [97 %-99 %] 98 % (04/12 1211) Weight:  [111 lb 3.2 oz (50.4 kg)] 111 lb 3.2 oz (50.4 kg) (04/12 0500) Last BM Date: 09/11/16  Intake/Output from previous day: 04/11 0701 - 04/12 0700 In: 640 [P.O.:540; IV Piggyback:100] Out: 1562 [Urine:1500; Drains:62] Intake/Output this shift: Total I/O In: 340 [P.O.:240; IV Piggyback:100] Out: 300 [Urine:300]  Physical exam:  Abdomen soft nontender purulence in drains bilious output in the anterior drain  Lab Results: CBC   Recent Labs  09/13/16 0543  WBC 6.9  HGB 10.7*  HCT 34.0*  PLT 716*   BMET  Recent Labs  09/13/16 0543  NA 138  K 4.1  CL 107  CO2 24  GLUCOSE 85  BUN 11  CREATININE 0.38*  CALCIUM 9.1   PT/INR No results for input(s): LABPROT, INR in the last 72 hours. ABG No results for input(s): PHART, HCO3 in the last 72 hours.  Invalid input(s): PCO2, PO2  Studies/Results: No results found.  Anti-infectives: Anti-infectives    Start     Dose/Rate Route Frequency Ordered Stop   09/08/16 0600  piperacillin-tazobactam (ZOSYN) IVPB 3.375 g     3.375 g 12.5 mL/hr over 240 Minutes Intravenous Every 8 hours 09/07/16 1652     09/07/16 2200  piperacillin-tazobactam (ZOSYN) IVPB 3.375 g     3.375 g 12.5 mL/hr over 240 Minutes Intravenous Every 8 hours 09/07/16 1652 09/08/16 0217   09/05/16 2200  piperacillin-tazobactam (ZOSYN) IVPB 3.375 g  Status:  Discontinued     3.375 g 12.5 mL/hr over 240 Minutes Intravenous Every 8 hours 09/05/16 0925 09/07/16 1652   09/04/16 0000  anidulafungin 100 mg in sodium  chloride 0.9 % 100 mL     100 mg over 90 Minutes Intravenous Every 24 hours 09/03/16 2119     09/04/16 0000  piperacillin-tazobactam (ZOSYN) 3.375 GM/50ML IVPB     3.375 g 12.5 mL/hr over 240 Minutes Intravenous Every 8 hours 09/03/16 2119 09/18/16 2359   08/16/16 1500  anidulafungin (ERAXIS) 100 mg in sodium chloride 0.9 % 100 mL IVPB  Status:  Discontinued     100 mg over 90 Minutes Intravenous Every 24 hours 08/15/16 1428 09/05/16 1516   08/15/16 1500  anidulafungin (ERAXIS) 200 mg in sodium chloride 0.9 % 200 mL IVPB     200 mg over 180 Minutes Intravenous  Once 08/15/16 1428 08/15/16 1822   08/15/16 1430  piperacillin-tazobactam (ZOSYN) IVPB 3.375 g     3.375 g 12.5 mL/hr over 240 Minutes Intravenous Every 8 hours 08/15/16 1428 09/05/16 1800   08/15/16 0730  fluconazole (DIFLUCAN) IVPB 400 mg  Status:  Discontinued     400 mg 100 mL/hr over 120 Minutes Intravenous Every 24 hours 08/15/16 0712 08/15/16 1402   08/11/16 0945  ertapenem (INVANZ) 1 g in sodium chloride 0.9 % 50 mL IVPB  Status:  Discontinued     1 g 100 mL/hr over 30 Minutes Intravenous Every 24 hours 08/11/16 0939 08/15/16 1415   08/05/16 1500  vancomycin (VANCOCIN) IVPB 1000 mg/200 mL premix  Status:  Discontinued  1,000 mg 200 mL/hr over 60 Minutes Intravenous Every 12 hours 08/05/16 0753 08/06/16 0850   08/05/16 0800  vancomycin (VANCOCIN) IVPB 1000 mg/200 mL premix     1,000 mg 200 mL/hr over 60 Minutes Intravenous  Once 08/05/16 0753 08/05/16 0923   07/30/16 1815  fluconazole (DIFLUCAN) IVPB 400 mg  Status:  Discontinued     400 mg 100 mL/hr over 120 Minutes Intravenous Every 24 hours 07/30/16 1743 08/13/16 1358   07/30/16 1528  piperacillin-tazobactam (ZOSYN) 3.375 GM/50ML IVPB    Comments:  LEWIS, CINDY: cabinet override      07/30/16 1528 07/30/16 1525   07/21/16 0000  piperacillin-tazobactam (ZOSYN) IVPB 3.375 g  Status:  Discontinued     3.375 g 12.5 mL/hr over 240 Minutes Intravenous Every 8 hours  07/20/16 1809 08/11/16 0939   07/20/16 1430  piperacillin-tazobactam (ZOSYN) IVPB 3.375 g     3.375 g 100 mL/hr over 30 Minutes Intravenous  Once 07/20/16 1424 07/20/16 1603      Assessment/Plan: s/p Procedure(s): ,remove 2  JP drains,add one pennrose   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 Patient is tolerating a diet and can likely go home as he output is quite minimal. She does want this to go home but we will arrange for home IV antibiotics is been discussed with the patient as well as with social services patient is adamant that she can control her drains etc. and should have no trouble at home.  Florene Glen, MD, FACS  09/13/2016

## 2016-09-14 MED ORDER — PANTOPRAZOLE SODIUM 40 MG PO TBEC
40.0000 mg | DELAYED_RELEASE_TABLET | Freq: Every day | ORAL | Status: DC
  Administered 2016-09-14: 40 mg via ORAL
  Filled 2016-09-14: qty 1

## 2016-09-14 MED ORDER — PIPERACILLIN-TAZOBACTAM 3.375 G IVPB: 3.3750 g | Freq: Three times a day (TID) | INTRAVENOUS | 1 refills | Status: AC

## 2016-09-14 MED ORDER — BOOST / RESOURCE BREEZE PO LIQD: 1.0000 | Freq: Three times a day (TID) | ORAL | 0 refills | Status: DC

## 2016-09-14 MED ORDER — PANTOPRAZOLE SODIUM 40 MG PO TBEC: 40.0000 mg | DELAYED_RELEASE_TABLET | Freq: Every day | ORAL | Status: DC

## 2016-09-14 MED ORDER — OXYCODONE-ACETAMINOPHEN 7.5-325 MG PO TABS: 1.0000 | ORAL_TABLET | ORAL | 0 refills | Status: DC | PRN

## 2016-09-14 MED ORDER — PANTOPRAZOLE SODIUM 40 MG PO TBEC: 40.0000 mg | DELAYED_RELEASE_TABLET | Freq: Two times a day (BID) | ORAL | 1 refills | Status: DC

## 2016-09-14 NOTE — Care Management (Signed)
Patient to discharge home today.  [facility] to deliver home IV [facility] prior to discharge.  First dose of home IV antibiotics to be administered this afternoon after discharge. Patient to discharge with home health PT and RN.  Corene Cornea with [facility] notified of discharge plan.  Follow up appointment made with Cassell Smiles DNP.  Appointment information added to AVS. RNCM signing off.

## 2016-09-14 NOTE — Discharge Instructions (Signed)
[facility] Stay Proper nutrition can help your body recover from illness and injury.   Foods and beverages high in protein, vitamins, and minerals help rebuild muscle loss, promote healing, & reduce fall risk.   In addition to eating healthy foods, a nutrition shake is an easy, delicious way to get the nutrition you need during and after your hospital stay  It is recommended that you continue to drink 3 bottles per day of:       Boost Breeze for at least 1 month (30 days) after your hospital stay   Tips for adding a nutrition shake into your routine: As allowed, drink one with vitamins or medications instead of water or juice Enjoy one as a tasty mid-morning or afternoon snack Drink cold or make a milkshake out of it Drink one instead of milk with cereal or snacks Use as a coffee creamer   Available at the following grocery stores and pharmacies:           * [employer] [facility] [phone]            For COUPONS visit: www.ensure.com/join or [email or web address]   Suggested Substitutions Ensure Plus = Boost Plus = Carnation Breakfast Essentials = Boost Compact Ensure Active Clear = Boost Breeze Glucerna Shake = Boost Glucose Control = Carnation Breakfast Essentials SUGAR FREE    Home health will assist with dressing in drain care but empty and record drain output daily May shower replacing dry dressings daily Follow-up with [facility] in 10-14 days

## 2016-09-14 NOTE — Progress Notes (Signed)
33 Days Post-Op  Subjective: Patient feels well today and is ready for discharge she assures me that she is capable of caring for her IV antibiotics and multiple drains and wounds. Home health is being arranged as well and she has a friend who can help her at home.  Objective: Vital signs in last 24 hours: Temp:  [97.5 F (36.4 C)-98.4 F (36.9 C)] 97.5 F (36.4 C) (04/13 0432) Pulse Rate:  [85-92] 85 (04/13 0432) Resp:  [16-20] 16 (04/13 0432) BP: (101-111)/(51-66) 107/66 (04/13 0432) SpO2:  [98 %-100 %] 99 % (04/13 0432) Weight:  [112 lb 1.6 oz (50.8 kg)] 112 lb 1.6 oz (50.8 kg) (04/13 0500) Last BM Date: 09/13/16  Intake/Output from previous day: 04/12 0701 - 04/13 0700 In: 630 [P.O.:480; IV Piggyback:150] Out: 1530 [Urine:1500; Drains:30] Intake/Output this shift: No intake/output data recorded.  Physical exam:  Abdomen soft nontender nondistended nontympanitic wound is dressed drains are in place some bilious output and some purulent output as well left chest tubes are in place  Lab Results: CBC   Recent Labs  09/13/16 0543  WBC 6.9  HGB 10.7*  HCT 34.0*  PLT 716*   BMET  Recent Labs  09/13/16 0543  NA 138  K 4.1  CL 107  CO2 24  GLUCOSE 85  BUN 11  CREATININE 0.38*  CALCIUM 9.1   PT/INR No results for input(s): LABPROT, INR in the last 72 hours. ABG No results for input(s): PHART, HCO3 in the last 72 hours.  Invalid input(s): PCO2, PO2  Studies/Results: No results found.  Anti-infectives: Anti-infectives    Start     Dose/Rate Route Frequency Ordered Stop   09/14/16 0000  piperacillin-tazobactam (ZOSYN) 3.375 GM/50ML IVPB     3.375 g 12.5 mL/hr over 240 Minutes Intravenous Every 8 hours 09/14/16 0716 09/24/16 2359   09/08/16 0600  piperacillin-tazobactam (ZOSYN) IVPB 3.375 g     3.375 g 12.5 mL/hr over 240 Minutes Intravenous Every 8 hours 09/07/16 1652     09/07/16 2200  piperacillin-tazobactam (ZOSYN) IVPB 3.375 g     3.375 g 12.5  mL/hr over 240 Minutes Intravenous Every 8 hours 09/07/16 1652 09/08/16 0217   09/05/16 2200  piperacillin-tazobactam (ZOSYN) IVPB 3.375 g  Status:  Discontinued     3.375 g 12.5 mL/hr over 240 Minutes Intravenous Every 8 hours 09/05/16 0925 09/07/16 1652   09/04/16 0000  anidulafungin 100 mg in sodium chloride 0.9 % 100 mL     100 mg over 90 Minutes Intravenous Every 24 hours 09/03/16 2119     09/04/16 0000  piperacillin-tazobactam (ZOSYN) 3.375 GM/50ML IVPB     3.375 g 12.5 mL/hr over 240 Minutes Intravenous Every 8 hours 09/03/16 2119 09/18/16 2359   08/16/16 1500  anidulafungin (ERAXIS) 100 mg in sodium chloride 0.9 % 100 mL IVPB  Status:  Discontinued     100 mg over 90 Minutes Intravenous Every 24 hours 08/15/16 1428 09/05/16 1516   08/15/16 1500  anidulafungin (ERAXIS) 200 mg in sodium chloride 0.9 % 200 mL IVPB     200 mg over 180 Minutes Intravenous  Once 08/15/16 1428 08/15/16 1822   08/15/16 1430  piperacillin-tazobactam (ZOSYN) IVPB 3.375 g     3.375 g 12.5 mL/hr over 240 Minutes Intravenous Every 8 hours 08/15/16 1428 09/05/16 1800   08/15/16 0730  fluconazole (DIFLUCAN) IVPB 400 mg  Status:  Discontinued     400 mg 100 mL/hr over 120 Minutes Intravenous Every 24 hours 08/15/16 0712 08/15/16 1402  08/11/16 0945  ertapenem (INVANZ) 1 g in sodium chloride 0.9 % 50 mL IVPB  Status:  Discontinued     1 g 100 mL/hr over 30 Minutes Intravenous Every 24 hours 08/11/16 0939 08/15/16 1415   08/05/16 1500  vancomycin (VANCOCIN) IVPB 1000 mg/200 mL premix  Status:  Discontinued     1,000 mg 200 mL/hr over 60 Minutes Intravenous Every 12 hours 08/05/16 0753 08/06/16 0850   08/05/16 0800  vancomycin (VANCOCIN) IVPB 1000 mg/200 mL premix     1,000 mg 200 mL/hr over 60 Minutes Intravenous  Once 08/05/16 0753 08/05/16 0923   07/30/16 1815  fluconazole (DIFLUCAN) IVPB 400 mg  Status:  Discontinued     400 mg 100 mL/hr over 120 Minutes Intravenous Every 24 hours 07/30/16 1743 08/13/16  1358   07/30/16 1528  piperacillin-tazobactam (ZOSYN) 3.375 GM/50ML IVPB    Comments:  LEWIS, CINDY: cabinet override      07/30/16 1528 07/30/16 1525   07/21/16 0000  piperacillin-tazobactam (ZOSYN) IVPB 3.375 g  Status:  Discontinued     3.375 g 12.5 mL/hr over 240 Minutes Intravenous Every 8 hours 07/20/16 1809 08/11/16 0939   07/20/16 1430  piperacillin-tazobactam (ZOSYN) IVPB 3.375 g     3.375 g 100 mL/hr over 30 Minutes Intravenous  Once 07/20/16 1424 07/20/16 1603      Assessment/Plan: s/p Procedure(s): ,remove 2  JP drains,add one pennrose   Patient is doing very well she has refused to go to a skilled nursing facility and wants to go home arrangements are being made for home health and she has a friend who will stay with her. She will follow-up in our office in 10 days with the drains in place. She will continue her IV antibiotics for at least 10 days probably longer.  Florene Glen, MD, FACS  09/14/2016

## 2016-09-14 NOTE — Discharge Summary (Signed)
Physician Discharge Summary  Patient ID: Lindsey Wolf MRN: [id number] DOB/AGE: 10/25/1965 51 y.o.  Admit date: 07/20/2016 Discharge date: 09/14/2016   Discharge Diagnoses:  Active Problems:   Perforated ulcer ([place])   Abdominal abscess (HCC)   Abnormal respirations   Pleural effusion   Respiratory distress   Empyema of left pleural space (HCC)   Protein-calorie malnutrition, severe   Procedures:PICC line placement, exploratory laparotomy and closure of perforated ulcers, drainage of intra-abdominal abscesses with a exploratory laparotomy, intubation and mechanical ventilation, left thoracotomy with drainage of empyema, right pleural effusion drainage, multiple drainage of intra-abdominal abscesses via IR.  Hospital Course: This patient admitted the hospital with signs of an acute abdomen and perforated viscus. She was taken the operating room emergently where 2 separate perforations of her stomach were identified and closed. Postoperatively she continued to do poorly and her white blood cell count was elevated and she ultimately failed and required mechanical ventilation and intubation. She was then taken to the operating room as for a second emergent operation where a third perforation was identified and drainage catheters were placed. She responded well to IV antibiotics following this and was extubated and moved to the [place] floor. He required PICC line placement for IV nutrition. She then developed pleural effusions on the right side this was drained percutaneously and on the left there were signs of an empyema and she was taken back to the operating room for left thoracotomy and drainage and decortication. Multiple drain catheters were placed. Subsequently there is need for further CT-guided drainage of additional cavities. Both intra-abdominal and intrathoracic. 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  Currently she is on IV Zosyn Q8 and is tolerating a regular diet with supplements and meeting her Caloric needs the TPN has been stopped. The team has offered skilled nursing facility but the patient is adamant that she can care for her multiple drains and wound at home. Home health is being arranged for both physical therapy and skilled nursing for wound care. She will follow-up with [facility] and with Dr. Genevive Bi and with infectious diseases for continued care. She is on a regular diet may shower. She is given oral analgesics for pain and IV antibiotics Q8 the home health.  Patient understands return either to the office with the emergency room should any condition cause her more problems or worsening of her situation.  Consults: Internal medicine, critical care medicine, nutrition, interventional radiology, thoracic surgery, infectious diseases, social services  Disposition:   Discharge Instructions    Ambulatory referral to [place] [facility]    Complete by:  As directed    Please evaluate Lindsey Wolf for admission to [facility].  Disciplines requested:  nursing, wound care, physical therapy  Services to provide:   Physician to follow patient's care (the person listed here will be responsible for signing ongoing orders):  } [place]  Requested Start of Care Date: 08/65/7846  I certify that this patient is under my care and that I, or a Nurse Practitioner or Physician's Assistant working with me, had a face-to-face encounter that meets the physician face-to-face requirements with patient on 09/14/2016. The encounter with the patient was in whole, or in part for the following medical condition(s) which is the primary reason for home health care (List medical condition). Multiple postoperative wounds and drainage catheters  Special Instructions:  Dry dressings to left chest tubes and drain canisters. Multiple  drain canisters to be emptied and recorded. Midline abdominal  wound with daily dressing. Nutritional assessment and physical therapy   Does the patient have Medicare or Medicaid?:  No   The encounter with the patient was in whole, or in part, for the following medical condition, which is the primary reason for home health care:  perforated ulcer   Reason for Medically Necessary Home Health Services:  Skilled Nursing- Post-Surgical Wound Assessment and Care   My clinical findings support the need for the above services:  Pain interferes with ambulation/mobility   I certify that, based on my findings, the following services are medically necessary home health services:   Nursing Physical therapy     Further, I certify that my clinical findings support that this patient is homebound due to:  Pain interferes with ambulation/mobility   Call MD for:  difficulty breathing, headache or visual disturbances    Complete by:  As directed    Call MD for:  extreme fatigue    Complete by:  As directed    Call MD for:  hives    Complete by:  As directed    Call MD for:  persistant dizziness or light-headedness    Complete by:  As directed    Call MD for:  persistant nausea and vomiting    Complete by:  As directed    Call MD for:  redness, tenderness, or signs of infection (pain, swelling, redness, odor or green/yellow discharge around incision site)    Complete by:  As directed    Call MD for:  severe uncontrolled pain    Complete by:  As directed    Call MD for:  temperature >100.4    Complete by:  As directed    Diet - low sodium heart healthy    Complete by:  As directed    Discharge wound care:    Complete by:  As directed    Abd wound vac changed M,W,F. Empty and measure JP drains BID.  Change Chest dressing BID. PT has three empyema tubes. Keep heimlich valve in place.   Increase activity slowly    Complete by:  As directed        Contact information for follow-up providers    Jules Husbands, MD Follow up in 11 day(s).   Specialty:  General  Surgery Contact information: [address] [place] [address] [phone]            Contact information for after-discharge care    [facility] Follow up.   Specialty:  [place] Contact information: [place]                  Florene Glen, MD, FACS

## 2016-09-14 NOTE — Progress Notes (Signed)
ANTIBIOTIC/ANTIFUnGAL CONSULT NOTE - FOLLOW UP  Pharmacy Consult for Zosyn  Indication: intra-abdominal infection  No Known Allergies  Patient Measurements: Height: 5\' 10"  (177.8 cm) Weight: 112 lb 1.6 oz (50.8 kg) IBW/kg (Calculated) : 68.5  Vital Signs: Temp: 97.5 F (36.4 C) (04/13 0432) Temp Source: Oral (04/13 0432) BP: 107/66 (04/13 0432) Pulse Rate: 85 (04/13 0432) Intake/Output from previous day: 04/12 0701 - 04/13 0700 In: 630 [P.O.:480; IV Piggyback:150] Out: 1530 [Urine:1500; Drains:30] Intake/Output from this shift: No intake/output data recorded.  Labs:  Recent Labs  09/13/16 0543  WBC 6.9  HGB 10.7*  PLT 716*  CREATININE 0.38*   Estimated Creatinine Clearance: 68.2 mL/min (A) (by C-G formula based on SCr of 0.38 mg/dL (L)).  Assessment: Pharmacy consulted to dose piperacillin/tazobactam and Eraxis in this 51 year old female with intrabdominal infection post perforated peptic ulcer.   Plan:  Continue Zosyn EI 3.375 g IV q8 hours.  Larene Beach, PharmD Clinical Pharmacist 09/14/2016,8:47 AM

## 2016-09-14 NOTE — Progress Notes (Signed)
Discharge instructions, follow-up appointments, and prescriptions were provided to the pt. All questions answered.  The pt was taken downstairs via wheelchair by volunteer services.

## 2016-09-17 ENCOUNTER — Encounter: Payer: Self-pay | Admitting: Cardiothoracic Surgery

## 2016-09-17 ENCOUNTER — Other Ambulatory Visit: Payer: Self-pay

## 2016-09-17 ENCOUNTER — Ambulatory Visit
Admission: RE | Admit: 2016-09-17 | Discharge: 2016-09-17 | Disposition: A | Discharge status: Home / Self Care | Payer: BLUE CROSS/BLUE SHIELD | Source: Ambulatory Visit | Attending: Cardiothoracic Surgery | Admitting: Cardiothoracic Surgery

## 2016-09-17 DIAGNOSIS — J90 Pleural effusion, not elsewhere classified: Secondary | ICD-10-CM | POA: Diagnosis present

## 2016-09-17 DIAGNOSIS — J869 Pyothorax without fistula: Secondary | ICD-10-CM

## 2016-09-18 ENCOUNTER — Encounter: Payer: Self-pay | Admitting: Cardiothoracic Surgery

## 2016-09-18 ENCOUNTER — Ambulatory Visit (INDEPENDENT_AMBULATORY_CARE_PROVIDER_SITE_OTHER): Payer: BLUE CROSS/BLUE SHIELD | Admitting: Cardiothoracic Surgery

## 2016-09-18 VITALS — BP 136/81 | HR 107 | Temp 97.8°F | Wt 115.0 lb

## 2016-09-18 DIAGNOSIS — J869 Pyothorax without fistula: Secondary | ICD-10-CM

## 2016-09-18 NOTE — Progress Notes (Signed)
She returns today. She's had no fevers or chills. She's been eating much better. 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 It's now draining about 100 cc per day. The other 2 drains in the perisplenic abscess and in the empyema have drained nothing since hospital discharge 5 days ago. She states she feels well. Her wounds are all clean dry and intact. I did remove the perisplenic drain today. I will leave the empyema drain in place. I advanced her chest tubes to out. I instructed her on changing the wounds daily with quarter inch iodoform gauze. Her friend from the cancer center is able to help her with that. The midline wound is now all closed with the exception of a 1 x 1 cm area at the inferior aspect of the wound. There is no purulent drainage or erythema. I have independently reviewed the patient's chest x-ray from yesterday. There is a small amount of air at the very apex of the left chest. There is no obvious air-fluid levels or other complicating features. I will see her back on Friday with another chest x-ray. She was seen today in consultation with Dr. Rosana Hoes who agreed with removing the perisplenic J.P. as it has not drained in over 5 days.

## 2016-09-18 NOTE — Patient Instructions (Signed)
Please make sure you keep up with your draining log daily. Please bring it on Friday when you come in.  Change your dressing (dry) once a day.  Please change your packing strip once a day. Each day you will notice that you will need less.  We will see you on Friday.

## 2016-09-20 ENCOUNTER — Other Ambulatory Visit: Payer: Self-pay

## 2016-09-20 DIAGNOSIS — J869 Pyothorax without fistula: Secondary | ICD-10-CM

## 2016-09-21 ENCOUNTER — Ambulatory Visit (INDEPENDENT_AMBULATORY_CARE_PROVIDER_SITE_OTHER): Payer: BLUE CROSS/BLUE SHIELD | Admitting: Surgery

## 2016-09-21 ENCOUNTER — Encounter: Payer: Self-pay | Admitting: Surgery

## 2016-09-21 ENCOUNTER — Ambulatory Visit
Admission: RE | Admit: 2016-09-21 | Discharge: 2016-09-21 | Disposition: A | Discharge status: Home / Self Care | Payer: BLUE CROSS/BLUE SHIELD | Source: Ambulatory Visit | Attending: Cardiothoracic Surgery | Admitting: Cardiothoracic Surgery

## 2016-09-21 ENCOUNTER — Encounter: Payer: Self-pay | Admitting: General Surgery

## 2016-09-21 ENCOUNTER — Ambulatory Visit (INDEPENDENT_AMBULATORY_CARE_PROVIDER_SITE_OTHER): Payer: BLUE CROSS/BLUE SHIELD | Admitting: Cardiothoracic Surgery

## 2016-09-21 ENCOUNTER — Encounter: Payer: Self-pay | Admitting: Cardiothoracic Surgery

## 2016-09-21 VITALS — BP 109/77 | HR 102 | Temp 97.6°F | Ht 70.0 in | Wt 115.0 lb

## 2016-09-21 DIAGNOSIS — J869 Pyothorax without fistula: Secondary | ICD-10-CM | POA: Insufficient documentation

## 2016-09-21 DIAGNOSIS — Z09 Encounter for follow-up examination after completed treatment for conditions other than malignant neoplasm: Secondary | ICD-10-CM

## 2016-09-21 DIAGNOSIS — J90 Pleural effusion, not elsewhere classified: Secondary | ICD-10-CM | POA: Insufficient documentation

## 2016-09-21 NOTE — Progress Notes (Signed)
09/21/2016  HPI: Patient is s/p exploratory laparotomy x 2 and left thoracotomy x 2 for perforated peptic ulcers and left-sided empyema.  She had a long hospital course between 07/20/16 and 09/14/16.  She required multiple percutaneous drains and eventually was discharged with empyema tubes, a left sided perc chest tube, and an abdominal perc drain.  The abdominal perc drain was complicated by enterocutaneous fistula, low output.  The patient required long-term TPN during her hospitalization, but was discontinued prior to discharge.  She had a midline incision with wound vac dressing which was removed prior to discharge and is doing dry gauze dressing changes now.  Since her discharge, her empyema tubes were removed by Dr. Genevive Bi, and now only the two percutaneous drains remain.  Her enterocutaneous fistula is draining about 100-300 ml per day, though mostly between 100-200 ml.  She feels with good appetite and is eating better and has more energy.  Vital signs: BP 109/77   Pulse (!) 102   Temp 97.6 F (36.4 C) (Oral)   Ht 5\' 10"  (1.778 m)   Wt 52.2 kg (115 lb)   BMI 16.50 kg/m    Physical Exam: Constitutional: No acute distress Pulm:  Left percutaneous chest drain with serosanguinous fluid in bulb Abdomen:  Soft, nondistended, nontender to palpation.  Midline incision with open area of about 1.5 cm width, with healthy granulation tissue.  Dry gauze dressing applied.  Abdominal percutaneous drain with bilious fluid in bulb.  Assessment/Plan: 51 yo female s/p exploratory laparotomy x 2 and left thoracotomy x 2 for perforated peptic ulcers and left-sided empyema.  I am very pleased with the patient's improvement.  She is eating well and has more energy.  She is in better spirits today.  Recommended that she continue eating high protein diet and recommended different protein powder brands to potentially try.  Her percutaneous drain / enterocutaneous fistula has low output which did increase as she  started eating more compared to when she was on TPN only, but is still low output and does not require any octreotide.  Discussed with the patient that it needs to be in place for at least a total of 6 weeks in order to allow the fistula to mature before able to remove the drain out.    The patient will follow up in 2 weeks with Dr. Dahlia Byes.  At that time, it will be 6 weeks since drain placement, and 2 more weeks for another check of the midline wound.   Melvyn Neth, [place]

## 2016-09-21 NOTE — Patient Instructions (Signed)
Please continue to change your dressing once a week.  At this time you are able to take a shower with soap and water.  Please cover your Antibiotic catheter with surround wrap so it doesn't get wet.

## 2016-09-21 NOTE — Progress Notes (Signed)
Ears today in follow-up. She's had no fevers or chills. There is been no drainage from the pleural drain. Her chest x-ray today looks great. There is no fluid present. I removed the drain on the left chest. I changed her midline abdominal wound. It's now down to just a small 1 cm area at the inferior aspect of the wound without drainage. In addition the 3 chest tube sites were redressed and again only a tip of the Q-tip could be placed into those of a 4 x 4 dressing was applied. She'll come back to see both our general surgeons and myself next week. She continues to drain about 200 cc of drainage from her midline Jackson-Pratt.

## 2016-09-21 NOTE — Patient Instructions (Signed)
Please continue changing the dressing daily.  Please continue to record output from the drains. Please see your appointment listed below. Please call our office if you have questions or concerns.

## 2016-09-24 ENCOUNTER — Other Ambulatory Visit: Payer: Self-pay | Admitting: General Practice

## 2016-09-24 ENCOUNTER — Encounter: Payer: Self-pay | Admitting: Cardiothoracic Surgery

## 2016-09-24 ENCOUNTER — Ambulatory Visit: Payer: Self-pay | Admitting: Family Medicine

## 2016-09-24 MED ORDER — ACETAMINOPHEN-CODEINE #3 300-30 MG PO TABS: 1.0000 | ORAL_TABLET | ORAL | 0 refills | Status: DC | PRN

## 2016-09-24 NOTE — Telephone Encounter (Signed)
Patient called and is asking for a refill on her Percocet 7.5-325 mg tablet. She said she has four to five pills left. She said she received a 30day supply and has been taken one in the morning and one before bed, but did take three in one day. She is using [place]. Please advice.

## 2016-09-24 NOTE — Telephone Encounter (Signed)
Called patient back to let her know that I had spoken to Dr. Dahlia Byes in reference to her requesting medications. Told patient that Dr. Dahlia Byes feels that she should be seen by our surgeon in the office. I told her that Dr. Adonis Huguenin could see her tomorrow at our [facility] office. Patient agreed and stated that she would show up to her appointment. Appointment schedule for 09/25/2016 at 10:15 AM.

## 2016-09-25 ENCOUNTER — Ambulatory Visit (INDEPENDENT_AMBULATORY_CARE_PROVIDER_SITE_OTHER): Payer: BLUE CROSS/BLUE SHIELD | Admitting: General Surgery

## 2016-09-25 ENCOUNTER — Other Ambulatory Visit: Payer: Self-pay

## 2016-09-25 ENCOUNTER — Encounter: Payer: Self-pay | Admitting: General Surgery

## 2016-09-25 VITALS — BP 126/85 | HR 97 | Temp 97.9°F | Ht 70.0 in | Wt 117.0 lb

## 2016-09-25 DIAGNOSIS — Z4889 Encounter for other specified surgical aftercare: Secondary | ICD-10-CM

## 2016-09-25 MED ORDER — OXYCODONE-ACETAMINOPHEN 5-325 MG PO TABS: 1.0000 | ORAL_TABLET | Freq: Four times a day (QID) | ORAL | 0 refills | Status: DC | PRN

## 2016-09-25 MED ORDER — OXYCODONE-ACETAMINOPHEN 5-325 MG PO TABS: 1.0000 | ORAL_TABLET | ORAL | Status: DC | PRN

## 2016-09-25 NOTE — Progress Notes (Signed)
Outpatient Surgical Follow Up  09/25/2016  Lindsey Wolf is an 51 y.o. female.   Chief Complaint  Patient presents with  . Routine Post Op    Perforated Gastric Ulcer-Exploratory Laparotomy Dr Dahlia Byes 07/20/16    HPI: 51 year old female returns to clinic for follow-up from recent prolonged hospitalization. She reports having continued pain to be nearly out of her pain medications. She is eating well and having daily bowel function. She still has a JP drain in place with bilious output. Reports that the daily output varies widely from less than 10 mL to greater than 200. She states otherwise she continues to gain strength. She denies any fevers, chills, nausea, vomiting, shortness of breath, diarrhea, constipation. She does have chest pains secondary to her thoracotomy incision.  No past medical history on file.  Past Surgical History:  Procedure Laterality Date  . APPLICATION OF WOUND VAC N/A 08/12/2016   Procedure: ,remove 2  JP drains,add one [place]rose;  Surgeon: Florene Glen, MD;  Location: [facility] ORS;  Service: General;  Laterality: N/A;  . CHEST TUBE INSERTION Right 08/08/2016   Procedure: CHEST TUBE INSERTION;  Surgeon: [name], MD;  Location: [facility] ORS;  Service: General;  Laterality: Right;  . DEBRIDEMENT OF ABDOMINAL WALL ABSCESS N/A 08/08/2016   Procedure: DEBRIDEMENT OF ABDOMINAL WALL ABSCESS;  Surgeon: Florene Glen, MD;  Location: [facility] ORS;  Service: General;  Laterality: N/A;  . LAPAROTOMY N/A 07/20/2016   Procedure: EXPLORATORY LAPAROTOMY;  Surgeon: Jules Husbands, MD;  Location: [facility] ORS;  Service: General;  Laterality: N/A;  . LAPAROTOMY N/A 07/30/2016   Procedure: EXPLORATORY LAPAROTOMY drainage peritoneal abscess;  Surgeon: Clayburn Pert, MD;  Location: [facility] ORS;  Service: General;  Laterality: N/A;  . LYSIS OF ADHESION  07/30/2016   Procedure: LYSIS OF ADHESION;  Surgeon: Clayburn Pert, MD;  Location: [facility] ORS;  Service: General;;  . VIDEO ASSISTED THORACOSCOPY  (VATS)/THOROCOTOMY Left 08/08/2016   Procedure: VIDEO ASSISTED THORACOSCOPY (VATS)/THOROCOTOMY Possible Thoracotomy;  Surgeon: [name], MD;  Location: [facility] ORS;  Service: General;  Laterality: Left;    Family History  Problem Relation Age of Onset  . Cancer Mother     Lung  . COPD Mother   . Emphysema Mother   . Neuropathy Mother   . Anxiety disorder Mother   . Depression Mother   . Heart disease Father 64    Heart Attack  . Cancer Maternal Grandmother     Doesn't remember  . Emphysema Maternal Aunt   . Cancer Other     Doesn't remember    Social History:  reports that she has been smoking Cigarettes.  She has never used smokeless tobacco. She reports that she does not drink alcohol. Her drug history is not on file.  Allergies: No Known Allergies  Medications reviewed.    ROS A multipoint review of systems was completed, all pertinent positives and negatives are documented within the history of present illness the remainder are negative   BP 126/85   Pulse 97   Temp 97.9 F (36.6 C) (Oral)   Ht 5\' 10"  (1.778 m)   Wt 53.1 kg (117 lb)   BMI 16.79 kg/m   Physical Exam Gen.: No acute distress Chest: Clear to auscultation, mildly tender to palpation near the previous empyema tube sites Heart: Regular rhythm Abdomen: Soft, mildly tender to palpation in the midline, nondistended. JP drain with green, bilious output. Midline incision with minimal amount of granulation tissue in the most caudad aspect. No evidence  of erythema or purulence. The cephalad aspect of the incision has a healthy-appearing tissue with scab.    No results found for this or any previous visit (from the past 48 hour(s)). No results found.  Assessment/Plan:  1. Aftercare following surgery 51 year old female status post prolonged hospitalization secondary to perforated peptic ulcers. Doing well. Continues to have pain which is expected. Provided with a refill of the 5 mg oxycodone today in  clinic. This is a step down from the 7.5 mg she has been taking. Discussed maximal doses of Tylenol that she can take without causing damage to her liver. She voices understanding. She will keep her scheduled follow-up in clinic next week for additional wound, drain, well-being check.     Clayburn Pert, MD FACS General Surgeon  09/25/2016,1:43 PM

## 2016-09-25 NOTE — Patient Instructions (Signed)
Please take your prescription to your preferred pharmacy.  You may take aleve in between taking your prescription if needed.  No ibuprofen or asprin.  If you do take tylenol be sure not to take any more than 4000 mg in 24 hour period.  To make your showering process easier try using a shower shoulder strap to hook the bulb on to.

## 2016-09-27 ENCOUNTER — Ambulatory Visit: Payer: BLUE CROSS/BLUE SHIELD | Admitting: General Surgery

## 2016-09-28 ENCOUNTER — Encounter: Payer: BLUE CROSS/BLUE SHIELD | Admitting: Surgery

## 2016-09-28 ENCOUNTER — Encounter: Payer: BLUE CROSS/BLUE SHIELD | Admitting: Cardiothoracic Surgery

## 2016-09-28 ENCOUNTER — Ambulatory Visit: Payer: BLUE CROSS/BLUE SHIELD | Admitting: Nurse Practitioner

## 2016-10-03 ENCOUNTER — Other Ambulatory Visit: Payer: Self-pay

## 2016-10-03 DIAGNOSIS — R911 Solitary pulmonary nodule: Secondary | ICD-10-CM

## 2016-10-03 DIAGNOSIS — J869 Pyothorax without fistula: Secondary | ICD-10-CM

## 2016-10-04 ENCOUNTER — Ambulatory Visit
Admission: RE | Admit: 2016-10-04 | Discharge: 2016-10-04 | Disposition: A | Discharge status: Home / Self Care | Payer: BLUE CROSS/BLUE SHIELD | Source: Ambulatory Visit | Attending: Cardiothoracic Surgery | Admitting: Cardiothoracic Surgery

## 2016-10-04 ENCOUNTER — Encounter: Payer: Self-pay | Admitting: Surgery

## 2016-10-04 ENCOUNTER — Encounter: Payer: Self-pay | Admitting: Cardiothoracic Surgery

## 2016-10-04 ENCOUNTER — Ambulatory Visit (INDEPENDENT_AMBULATORY_CARE_PROVIDER_SITE_OTHER): Payer: BLUE CROSS/BLUE SHIELD | Admitting: Surgery

## 2016-10-04 ENCOUNTER — Ambulatory Visit (INDEPENDENT_AMBULATORY_CARE_PROVIDER_SITE_OTHER): Payer: BLUE CROSS/BLUE SHIELD | Admitting: Cardiothoracic Surgery

## 2016-10-04 ENCOUNTER — Ambulatory Visit: Payer: Self-pay | Admitting: Family Medicine

## 2016-10-04 VITALS — BP 124/83 | HR 97 | Temp 98.1°F | Wt 115.0 lb

## 2016-10-04 DIAGNOSIS — J90 Pleural effusion, not elsewhere classified: Secondary | ICD-10-CM | POA: Insufficient documentation

## 2016-10-04 DIAGNOSIS — J869 Pyothorax without fistula: Secondary | ICD-10-CM

## 2016-10-04 DIAGNOSIS — K275 Chronic or unspecified peptic ulcer, site unspecified, with perforation: Secondary | ICD-10-CM

## 2016-10-04 DIAGNOSIS — J9811 Atelectasis: Secondary | ICD-10-CM | POA: Diagnosis not present

## 2016-10-04 DIAGNOSIS — Z09 Encounter for follow-up examination after completed treatment for conditions other than malignant neoplasm: Secondary | ICD-10-CM

## 2016-10-04 NOTE — Progress Notes (Signed)
HPI s/p laparotomy for perforated peptic ulcers, requiring another laparotomy for further lasering of drainage for intraoperative abdominal abscess and thoracotomy for empyema. More recently and the patient developed iatrogenic enterocutaneous fistula that is well controlled by one of the drains from interventional radiology. She has all her chest tubes out and the only drain that she has is the one that is controlling the fistula. Currently the drain is putting out 200 cc a day. She is 6 weeks out from her drain placement She has done remarkably well and now is able to tolerate regular diet and have a bowel movement. No fevers or chills. She is on antibiotic and she still has 1 more week according to ID. She has no other symptoms other than some intermittent chest pain from were D thoracotomy incision was.  PE: NAD Abd: soft, nt. Midline incision healed no evidence of infection. No peritonitis. She does have a drain with bilious output.  A/P persistent enterocutaneous fistula with 200 cc a day of output. Currently nontoxic. We'll continue nutritional support and she needs to improve for her performance status. I have a feeling that th the fistula output will not drop. In the interim we will obtain a fistulogram to have a better idea of the anatomy and were this is located. After that we'll have a better idea of whether or not we will be able to pull the tube out or may require surgical intervention. Again it is too early to tell. We will follow her in about 10 days in the office. No need for emergent surgical intervention at this time

## 2016-10-04 NOTE — Progress Notes (Signed)
Lindsey Wolf comes in today for further follow-up. Her thoracotomy wound is well-healed. All of her chest tube sites are now healed. She has no new complaints. Her midline abdominal wound was pretreated of some old eschar. There is no obvious infection present. She continues to drain approximately 200 cc a day of bilious drainage from her Jackson-Pratt. She'll be seen by Dr. Adora Fridge today for management of that. No additional follow-up was made with me.

## 2016-10-05 ENCOUNTER — Encounter: Payer: BLUE CROSS/BLUE SHIELD | Admitting: Surgery

## 2016-10-05 NOTE — Addendum Note (Signed)
Addended by: Wayna Chalet on: 10/05/2016 10:01 AM   Modules accepted: Orders

## 2016-10-08 ENCOUNTER — Telehealth: Payer: Self-pay

## 2016-10-08 NOTE — Telephone Encounter (Signed)
Spoke with Pamala Hurry at this time. Fistulagram appointment 10/11/16 @ 7:45 am [facility] intrance and patient needs to stop at [employer]. No prep per Pamala Hurry.  I will alert Amber of the above appointment and discuss when the patient is to return to office.

## 2016-10-11 ENCOUNTER — Ambulatory Visit
Admission: RE | Admit: 2016-10-11 | Discharge: 2016-10-11 | Disposition: A | Discharge status: Home / Self Care | Payer: BLUE CROSS/BLUE SHIELD | Source: Ambulatory Visit | Attending: Surgery | Admitting: Surgery

## 2016-10-11 DIAGNOSIS — K275 Chronic or unspecified peptic ulcer, site unspecified, with perforation: Secondary | ICD-10-CM | POA: Diagnosis present

## 2016-10-11 MED ORDER — IOPAMIDOL (ISOVUE-300) INJECTION 61%
50.0000 mL | Freq: Once | INTRAVENOUS | Status: AC | PRN
  Administered 2016-10-11: 10 mL

## 2016-10-16 ENCOUNTER — Encounter: Payer: Self-pay | Admitting: Surgery

## 2016-10-16 ENCOUNTER — Ambulatory Visit (INDEPENDENT_AMBULATORY_CARE_PROVIDER_SITE_OTHER): Payer: BLUE CROSS/BLUE SHIELD | Admitting: Surgery

## 2016-10-16 VITALS — BP 138/94 | HR 115 | Temp 98.4°F | Ht 70.0 in | Wt 114.0 lb

## 2016-10-16 DIAGNOSIS — Z09 Encounter for follow-up examination after completed treatment for conditions other than malignant neoplasm: Secondary | ICD-10-CM

## 2016-10-16 DIAGNOSIS — K219 Gastro-esophageal reflux disease without esophagitis: Secondary | ICD-10-CM

## 2016-10-16 DIAGNOSIS — F329 Major depressive disorder, single episode, unspecified: Secondary | ICD-10-CM

## 2016-10-16 DIAGNOSIS — E876 Hypokalemia: Secondary | ICD-10-CM

## 2016-10-16 DIAGNOSIS — F419 Anxiety disorder, unspecified: Secondary | ICD-10-CM

## 2016-10-16 DIAGNOSIS — F32A Depression, unspecified: Secondary | ICD-10-CM

## 2016-10-16 MED ORDER — CYCLOBENZAPRINE HCL 5 MG PO TABS: 5.0000 mg | ORAL_TABLET | Freq: Three times a day (TID) | ORAL | 0 refills | Status: DC | PRN

## 2016-10-16 MED ORDER — HYDROCODONE-ACETAMINOPHEN 5-325 MG PO TABS: 1.0000 | ORAL_TABLET | Freq: Four times a day (QID) | ORAL | 0 refills | Status: DC | PRN

## 2016-10-16 NOTE — Progress Notes (Signed)
HPI s/p laparotomy for perforated peptic ulcers, requiring another laparotomy for further lasering of drainage for intraoperative abdominal abscess and thoracotomy for empyema.The patient developed iatrogenic enterocutaneous fistula that is well controlled by one of the drains from interventional radiology. She has all her chest tubes out and the only drain that she has is the one that is controlling the fistula. Currently the drain is putting out 135cc yesterday, and none the day before,  She is now connected to drain gravity  She is to complete A/B this week. No fevers no chill Only complaint is chest wall pain and anxiety She is takin PO   PE: NAD Abd: soft, nt. Midline incision healed no evidence of infection. No peritonitis. She does have a drain with bilious output.  A/P persistent enterocutaneous fistula with decrease output. Plan to obtain CT scan in 2 weeks w contrast via the tube. If no evidence of major cavity we will treat as a jejunostomy tube and clamp it to see if the pt tolerates this. If after clamping it for a few days pt does not become toxic or her condition does not worsen we may remove the drain. At that time it will be at least 10 wks from the time the drain has been placed and it will have had enough time for the track to mature. We will also refer to pain clinic. Refill norco and add flexeril for pain. No surgical intervention Once A/bs are completed PICC line may be removed.

## 2016-10-16 NOTE — Patient Instructions (Addendum)
Please increase your water intake to a goal of 72 ounces daily. This will help with your hydration and keep your pulse lower.  We have given you a different pain medication also to see if this will help with your severe pain.   The muscle relaxer that we have sent to your pharmacy today will most likely help more than the pain medication as it will help more with the spasms around your ribs that you have been experiencing.  We will place a referral today for the pain management clinic to help with some long term therapies for the chronic rib and abdominal pain.  I have called [facility] and given the nurse Sharee Pimple)  orders to take out your PICC line as I do not see any further need for antibiotics at this time.  We will obtain a CT Scan in 2 weeks. (I will call you with the appointment details as soon as they are available.)  If this scan is ok, we will clamp your drainage tube that you have in place for 3 days. If you do well with the clamping for 3 days, we will bring you back to clinic and remove the tube.

## 2016-10-16 NOTE — Addendum Note (Signed)
Addended by: Phillips Odor on: 10/16/2016 12:46 PM   Modules accepted: Orders

## 2016-10-17 ENCOUNTER — Telehealth: Payer: Self-pay

## 2016-10-17 NOTE — Telephone Encounter (Signed)
I have put in an internal referral to [facility]  I will follow up within 3-5 days to make sure the appointments have been scheduled.

## 2016-10-23 ENCOUNTER — Telehealth: Payer: Self-pay

## 2016-10-23 DIAGNOSIS — K651 Peritoneal abscess: Secondary | ICD-10-CM

## 2016-10-23 NOTE — Telephone Encounter (Signed)
Patient was advised by Dr. Dahlia Byes to schedule a CT scan prior to scheduling her next appointment. Can you schedule this for patient and give her a call with all the details?

## 2016-10-23 NOTE — Telephone Encounter (Signed)
Spoke with [place] in [place] in regards to patient and how study will need to be ordered. Per Dr. Pascal Lux- Oral contrast is to be given through the drain as if it were a J-tube.   Order placed. Patient scheduled for 11/02/16 at [place] Location- Please arrive 0915am.  Patient called and was given above information and verbalizes understanding.   She did state on the phone that the Norco is not as effective taking care of her pain as the Percocet. She however, is not using the Cyclobenzaprine. I explained to patient that I encourage her to try the Cyclobenzaprine TID around the clock along with Ibuprofen 600mg  TID as this may help with the Musculoskelatal pain she is experiencing at the site of her previous chest tubes more than the narcotic pain medication. If after 3 days, she is still experiencing pain and has no more relief than right now, she is to call the office and speak with a nurse for further instructions.

## 2016-10-31 ENCOUNTER — Telehealth: Payer: Self-pay

## 2016-10-31 NOTE — Telephone Encounter (Signed)
Patient called stating that when she came in on 10/16/2016 and saw Dr. Dahlia Byes, she was given Norco and Flexeril. She was also told that she was going to be referred to the Pain Clinic. However, she stated that she had not heard from them. I told her that I would check for the referral.  I looked at the referral and we had received a message from the Pain Clinic stating that they would contact the patient within 4-6 weeks from 10/17/16.  I told patient that I would need to speak to Dr. Dahlia Byes and ask him what he would recommend. Patient understood.

## 2016-11-01 MED ORDER — GABAPENTIN 600 MG PO TABS: 300.0000 mg | ORAL_TABLET | Freq: Three times a day (TID) | ORAL | 0 refills | Status: DC

## 2016-11-01 NOTE — Telephone Encounter (Signed)
Called patient back to let her know that Dr. Dahlia Byes would like to see her this morning at 9:00 AM. Patient stated that she had some people working at her house and did not want to leave the house with the workers at her house. I then told patient that I could ask Dr. Dahlia Byes if there could be a possibility that he could see her tomorrow. Patient understood.

## 2016-11-01 NOTE — Telephone Encounter (Signed)
Spoke with Dr. Dahlia Byes and he stated that he was not going to be able to see the patient tomorrow since he would be busy in the operating room. However, he told me that he was able to give her a refill on her Gabapentin if she needed it. I then called the patient to let her know that Dr. Dahlia Byes will not be able to see her tomorrow. However, I told her that he did give me the okay to send her a refill on her Gabapentin. Patient stated that it was fine since she will see him next week but to please send her the Gabapentin for her to try.

## 2016-11-02 ENCOUNTER — Ambulatory Visit
Admission: RE | Admit: 2016-11-02 | Discharge: 2016-11-02 | Disposition: A | Discharge status: Home / Self Care | Payer: BLUE CROSS/BLUE SHIELD | Source: Ambulatory Visit | Attending: Surgery | Admitting: Surgery

## 2016-11-02 ENCOUNTER — Ambulatory Visit: Admission: RE | Admit: 2016-11-02 | Payer: BLUE CROSS/BLUE SHIELD | Source: Ambulatory Visit

## 2016-11-02 ENCOUNTER — Ambulatory Visit: Payer: BLUE CROSS/BLUE SHIELD

## 2016-11-02 DIAGNOSIS — I700 Atherosclerosis of aorta: Secondary | ICD-10-CM | POA: Insufficient documentation

## 2016-11-02 DIAGNOSIS — K651 Peritoneal abscess: Secondary | ICD-10-CM | POA: Diagnosis not present

## 2016-11-02 MED ORDER — IOPAMIDOL (ISOVUE-300) INJECTION 61%
100.0000 mL | Freq: Once | INTRAVENOUS | Status: AC | PRN
  Administered 2016-11-02: 100 mL via INTRAVENOUS

## 2016-11-08 ENCOUNTER — Encounter: Payer: Self-pay | Admitting: Surgery

## 2016-11-08 ENCOUNTER — Ambulatory Visit (INDEPENDENT_AMBULATORY_CARE_PROVIDER_SITE_OTHER): Payer: BLUE CROSS/BLUE SHIELD | Admitting: Surgery

## 2016-11-08 VITALS — BP 129/86 | HR 97 | Temp 98.6°F | Ht 70.0 in | Wt 128.8 lb

## 2016-11-08 DIAGNOSIS — Z09 Encounter for follow-up examination after completed treatment for conditions other than malignant neoplasm: Secondary | ICD-10-CM

## 2016-11-08 MED ORDER — TRAMADOL HCL 50 MG PO TABS: 50.0000 mg | ORAL_TABLET | Freq: Four times a day (QID) | ORAL | 0 refills | Status: DC | PRN

## 2016-11-08 MED ORDER — GABAPENTIN 600 MG PO TABS: 600.0000 mg | ORAL_TABLET | Freq: Three times a day (TID) | ORAL | 0 refills | Status: DC

## 2016-11-08 NOTE — Progress Notes (Signed)
F/u after Ct scan. Catheter intraluminal but no drainable collections She is putting about 75-100cc day Taking PO No fevers or chills Gabapentin working for chronic pain Dramatic improvement over the last 2 weeks  PE NAD Abd: soft, Nt, drain in place. Capped  A/P doing well Will clamp drain and if no sxs we will remove Tramadol script given

## 2016-11-08 NOTE — Patient Instructions (Addendum)
We will see you back in 2 weeks. See appointment below.  We have clamped your drain today. If you begin having any increased abdominal pain, fever/chills, nausea/vomiting, and constipation/diarrhea- call our office immediately and speak with a nurse.  Continue with your appointment to see Dr. Ola Spurr scheduled tomorrow.  We will place your referral to Orthopedic today. Please call their office to make your appointment if you do not hear from them within 5 days. [facility]- [phone].  We have sent in a refill for your Gabapentin to your [facility] on [address]. Please take your prescription for your tramadol with you today as well and use only as needed.

## 2016-11-19 ENCOUNTER — Encounter: Payer: Self-pay | Admitting: Surgery

## 2016-11-19 ENCOUNTER — Telehealth: Payer: Self-pay

## 2016-11-19 ENCOUNTER — Ambulatory Visit (INDEPENDENT_AMBULATORY_CARE_PROVIDER_SITE_OTHER): Payer: BLUE CROSS/BLUE SHIELD | Admitting: Surgery

## 2016-11-19 VITALS — BP 117/79 | HR 96 | Temp 98.3°F | Ht 70.0 in | Wt 131.2 lb

## 2016-11-19 DIAGNOSIS — Z09 Encounter for follow-up examination after completed treatment for conditions other than malignant neoplasm: Secondary | ICD-10-CM

## 2016-11-19 MED ORDER — TRAMADOL HCL 50 MG PO TABS: 50.0000 mg | ORAL_TABLET | Freq: Four times a day (QID) | ORAL | 0 refills | Status: DC | PRN

## 2016-11-19 NOTE — Telephone Encounter (Signed)
Error

## 2016-11-19 NOTE — Patient Instructions (Signed)
We will contact you with your GI Appointment and have sent a pain clinic referral to [facility] that we will follow up on.  We will see you back in office as listed below.

## 2016-11-19 NOTE — Progress Notes (Signed)
F/U for drain. Drain has been clamped. No evidence of obstruction or any sxs. She is taking Po and continues to improve  PE NAD Abd: soft, Drain removed w/o issues.  Wounds are all healed  A/p Doing well Referral for EGD to f/u on gastric and duodenal ulcers Pain referral RTC 2-3 weeks No surgical issues at this time.

## 2016-11-19 NOTE — Addendum Note (Signed)
Addended by: Lowella Curb on: 11/19/2016 11:33 AM   Modules accepted: Orders

## 2016-11-19 NOTE — Telephone Encounter (Signed)
Left message for them to call back and make an appointment. Sending over referral to [facility] for patient. Verbal order asked by Dr. Dahlia Byes.

## 2016-11-19 NOTE — Addendum Note (Signed)
Addended by: Lowella Curb on: 11/19/2016 11:30 AM   Modules accepted: Orders

## 2016-11-19 NOTE — Telephone Encounter (Signed)
Lindsey Wolf from [facility] returned my call. I informed her that I have a referral being sent over to their office. However she did allow me to schedule her an appointment for 01/02/17 at 1:15 at the [facility] office. I will call patient and let her know of her appointment.   Called patient at this time made her aware of her appointment with [facility]. Gave her address and phone number of  GI. Patient verbalized understanding.

## 2016-11-20 ENCOUNTER — Telehealth: Payer: Self-pay | Admitting: Surgery

## 2016-11-20 DIAGNOSIS — M47816 Spondylosis without myelopathy or radiculopathy, lumbar region: Secondary | ICD-10-CM | POA: Insufficient documentation

## 2016-11-20 DIAGNOSIS — M7542 Impingement syndrome of left shoulder: Secondary | ICD-10-CM | POA: Insufficient documentation

## 2016-11-20 DIAGNOSIS — M179 Osteoarthritis of knee, unspecified: Secondary | ICD-10-CM | POA: Insufficient documentation

## 2016-11-20 NOTE — Telephone Encounter (Signed)
I have fax all clinical information to [facility] at [phone]. I spoke with Benjamine Mola with [facility] and she has stated that it will be between 3-5 business days to review records and to call the patient with an appointment.

## 2016-11-21 NOTE — Telephone Encounter (Signed)
I have spoke with Lindsey Wolf with [facility] pain management. She stated that she did receive the referral and that it could take 7-10 business days. However she stated that she hopes to review this tomorrow and have discuss an appointment with the patient on 11/23/16.  I will call Sonya on 11/23/16 to follow [phone]

## 2016-11-22 ENCOUNTER — Other Ambulatory Visit: Payer: Self-pay | Admitting: Surgery

## 2016-11-23 NOTE — Telephone Encounter (Signed)
I have spoken with patient to advise her that I have contacted [facility] pain clinic and Per Davy Pique, she has been added to the waiting list because they are 12 months out. Patient states that she is ok with this and doesn't feel that she will need to see them however she will make the decision for the appointment when they call.   She would like a refill on her pantoprazole (PROTONIX) 40 MG tablet.  Once this is sent, please advise patient.   Patient has an appointment with her PCP on 12/24/16 and will then request her PCP to continue the refill for this medication.

## 2016-11-23 NOTE — Telephone Encounter (Signed)
Called patient back but had to leave her a voicemail letting know that her pantoprazole was sent to her [place] but in the future she will need to get this refilled by her PCP since Dr. Burt Knack doesn't manage this medication.

## 2016-12-06 ENCOUNTER — Encounter: Payer: Self-pay | Admitting: General Surgery

## 2016-12-12 ENCOUNTER — Encounter: Payer: Self-pay | Admitting: Surgery

## 2016-12-28 ENCOUNTER — Other Ambulatory Visit: Payer: Self-pay | Admitting: Internal Medicine

## 2016-12-28 DIAGNOSIS — Z1231 Encounter for screening mammogram for malignant neoplasm of breast: Secondary | ICD-10-CM

## 2017-01-02 ENCOUNTER — Other Ambulatory Visit: Payer: Self-pay

## 2017-01-02 ENCOUNTER — Ambulatory Visit (INDEPENDENT_AMBULATORY_CARE_PROVIDER_SITE_OTHER): Payer: BLUE CROSS/BLUE SHIELD | Admitting: Gastroenterology

## 2017-01-02 ENCOUNTER — Encounter: Payer: Self-pay | Admitting: Gastroenterology

## 2017-01-02 VITALS — BP 116/77 | HR 88 | Ht 70.0 in | Wt 133.0 lb

## 2017-01-02 DIAGNOSIS — K255 Chronic or unspecified gastric ulcer with perforation: Secondary | ICD-10-CM | POA: Diagnosis not present

## 2017-01-02 DIAGNOSIS — K275 Chronic or unspecified peptic ulcer, site unspecified, with perforation: Secondary | ICD-10-CM

## 2017-01-02 NOTE — Progress Notes (Signed)
Jonathon Bellows MD, MRCP(U.K) [address]  [place], [place] [address]  Main: [phone]  Fax: [phone]   Gastroenterology Consultation  Referring Provider:     Dr Dahlia Byes  Primary Care Physician:  Tracie Harrier, MD Primary Gastroenterologist:  Dr. Jonathon Bellows  Reason for Consultation:     Gastric ulcers         HPI:   Lindsey Wolf is a 51 y.o. y/o female referred for consultation & management  by Dr. Tracie Harrier, MD.     Summary of history :  Patient was admitted at the hospital between 07/2016-09/2016 with a perforated gastric ulcer , had 2 ulcers ,patched , developed intraabdominal abscesses , respiratory faiure, needed surgery , complicated with a lung emphysema requiring thoracotomy , drainage , TPN, sepsis with IV antibioics, several abdominal drains .   She was taking a lot of BC powder for many years prior to the perforated ulcers in 07/2016 .   Very minimal abdominal pain presently . No NSAID's presently, on Protonix. Started to gain weight .  No PICC line or TPN lines presently.    Past Medical History:  Diagnosis Date  . Abnormal respirations   . Abscess of abdominal cavity ([place])   . Anxiety   . Depression   . 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 GERD (gastroesophageal reflux disease)   . Hypokalemia   . Perforated ulcer ([place])   . Pleural effusion   . Protein-calorie malnutrition, severe 08/17/2016  . Respiratory distress     Past Surgical History:  Procedure Laterality Date  . APPLICATION OF WOUND VAC N/A 08/12/2016   Procedure: ,remove 2  JP drains,add one pennrose;  Surgeon: Florene Glen, MD;  Location: [facility] ORS;  Service: General;  Laterality: N/A;  . CHEST TUBE INSERTION Right 08/08/2016   Procedure: CHEST TUBE INSERTION;  Surgeon: [name], MD;  Location: [facility] ORS;  Service: General;  Laterality: Right;  . DEBRIDEMENT OF ABDOMINAL WALL ABSCESS N/A 08/08/2016   Procedure: DEBRIDEMENT OF ABDOMINAL WALL ABSCESS;   Surgeon: Florene Glen, MD;  Location: [facility] ORS;  Service: General;  Laterality: N/A;  . LAPAROTOMY N/A 07/20/2016   Procedure: EXPLORATORY LAPAROTOMY;  Surgeon: Jules Husbands, MD;  Location: [facility] ORS;  Service: General;  Laterality: N/A;  . LAPAROTOMY N/A 07/30/2016   Procedure: EXPLORATORY LAPAROTOMY drainage peritoneal abscess;  Surgeon: Clayburn Pert, MD;  Location: [facility] ORS;  Service: General;  Laterality: N/A;  . LYSIS OF ADHESION  07/30/2016   Procedure: LYSIS OF ADHESION;  Surgeon: Clayburn Pert, MD;  Location: [facility] ORS;  Service: General;;  . VIDEO ASSISTED THORACOSCOPY (VATS)/THOROCOTOMY Left 08/08/2016   Procedure: VIDEO ASSISTED THORACOSCOPY (VATS)/THOROCOTOMY Possible Thoracotomy;  Surgeon: [name], MD;  Location: [facility] ORS;  Service: General;  Laterality: Left;    Prior to Admission medications   Medication Sig Start Date End Date Taking? Authorizing Provider  ALPRAZolam Duanne Moron) 0.5 MG tablet Take by mouth. 12/26/16  Yes [provider]  gabapentin (NEURONTIN) 600 MG tablet Take 1 tablet (600 mg total) by mouth 3 (three) times daily. 11/08/16   Pabon, Diego F, MD  ipratropium-albuterol (DUONEB) 0.5-2.5 (3) MG/3ML SOLN Take 3 mLs by nebulization every 4 (four) hours as needed. 09/03/16   Pabon, Diego F, MD  iron polysaccharides (NIFEREX) 150 MG capsule Take 1 capsule by mouth 1 day or 1 dose. 11/19/16 11/19/17  [provider]  mirtazapine (REMERON) 15 MG tablet Take 1 tablet by mouth at  bedtime. 10/19/16 10/19/17  [provider]  ondansetron (ZOFRAN-ODT) 4 MG disintegrating tablet Take 1 tablet (4 mg total) by mouth every 6 (six) hours as needed for nausea. 09/03/16   Pabon, Diego F, MD  pantoprazole (PROTONIX) 40 MG tablet TAKE 1 TABLET BY MOUTH TWICE DAILY 11/23/16   Florene Glen, MD  potassium chloride SA (K-DUR,KLOR-CON) 20 MEQ tablet Take 1 tablet by mouth 2 (two) times daily. 10/05/16   [provider]  traMADol (ULTRAM) 50 MG tablet Take 1  tablet (50 mg total) by mouth every 6 (six) hours as needed. 11/19/16   Pabon, [place], MD  venlafaxine XR (EFFEXOR-XR) 75 MG 24 hr capsule Take 75 mg by mouth once. 09/28/16 09/28/17  [provider]    Family History  Problem Relation Age of Onset  . Cancer Mother        Lung  . COPD Mother   . Emphysema Mother   . Neuropathy Mother   . Anxiety disorder Mother   . Depression Mother   . Heart disease Father 37       Heart Attack  . Cancer Maternal Grandmother        Doesn't remember  . Emphysema Maternal Aunt   . Cancer Other        Doesn't remember     Social History  Substance Use Topics  . Smoking status: Current Every Day Smoker    Packs/day: 1.00    Years: 11.00    Types: Cigarettes  . Smokeless tobacco: Never Used  . Alcohol use No    Allergies as of 01/02/2017  . (No Known Allergies)    Review of Systems:    All systems reviewed and negative except where noted in HPI.   Physical Exam:  There were no vitals taken for this visit. No LMP recorded. Patient is postmenopausal. Psych:  Alert and cooperative. Normal mood and affect. General:   Alert,  Well-developed, well-nourished, pleasant and cooperative in NAD Head:  Normocephalic and atraumatic. Eyes:  Sclera clear, no icterus.   Conjunctiva pink. Ears:  Normal auditory acuity. Nose:  No deformity, discharge, or lesions. Mouth:  No deformity or lesions,oropharynx pink & moist. Neck:  Supple; no masses or thyromegaly. Lungs:  Respirations even and unlabored.  Clear throughout to auscultation.   No wheezes, crackles, or rhonchi. No acute distress. Heart:  Regular rate and rhythm; no murmurs, clicks, rubs, or gallops. Abdomen: Large central abdominal scar,  Normal bowel sounds.  No bruits.  Soft, non-tender and non-distended without masses, hepatosplenomegaly or hernias noted.  No guarding or rebound tenderness.    Extremities:  No clubbing or edema.  No cyanosis. Neurologic:  Alert and oriented x3;   grossly normal neurologically. Psych:  Alert and cooperative. Normal mood and affect.  Imaging Studies:  No results found.  Assessment and Plan:   Lindsey Wolf is a 51 y.o. y/o female has been referred for endoscopy to evaluate for healing of perforated gastric ulcers with a long complicated hospital course.     Plan  1. Check H pylori stool antigen  2. EGD to evaluate for healing of gastric ulcers and r/o malignancy .  3. Continue PPI 4. No NSAID's  I have discussed alternative options, risks & benefits,  which include, but are not limited to, bleeding, infection, perforation,respiratory complication & drug reaction.  The patient agrees with this plan & written consent will be obtained.      Follow up as needed   Dr  Jonathon Bellows MD,MRCP(U.K)

## 2017-01-07 ENCOUNTER — Encounter: Payer: Self-pay | Admitting: General Surgery

## 2017-01-16 ENCOUNTER — Encounter: Payer: Self-pay | Admitting: Surgery

## 2017-02-01 ENCOUNTER — Ambulatory Visit
Admission: RE | Admit: 2017-02-01 | Discharge: 2017-02-01 | Disposition: A | Discharge status: Home / Self Care | Payer: BLUE CROSS/BLUE SHIELD | Source: Ambulatory Visit | Attending: Internal Medicine | Admitting: Internal Medicine

## 2017-02-01 DIAGNOSIS — Z1231 Encounter for screening mammogram for malignant neoplasm of breast: Secondary | ICD-10-CM | POA: Diagnosis not present

## 2017-02-03 LAB — H. PYLORI ANTIGEN, STOOL: H PYLORI AG STL: NEGATIVE

## 2017-02-05 ENCOUNTER — Telehealth: Payer: Self-pay

## 2017-02-05 NOTE — Telephone Encounter (Signed)
Advised patient

## 2017-02-19 ENCOUNTER — Ambulatory Visit: Payer: BLUE CROSS/BLUE SHIELD | Admitting: Anesthesiology

## 2017-02-19 ENCOUNTER — Ambulatory Visit
Admission: RE | Admit: 2017-02-19 | Discharge: 2017-02-19 | Disposition: A | Discharge status: Home / Self Care | Payer: BLUE CROSS/BLUE SHIELD | Source: Ambulatory Visit | Attending: Gastroenterology | Admitting: Gastroenterology

## 2017-02-19 ENCOUNTER — Encounter
Admission: RE | Disposition: A | Discharge status: Home / Self Care | Payer: Self-pay | Source: Ambulatory Visit | Attending: Gastroenterology

## 2017-02-19 DIAGNOSIS — K219 Gastro-esophageal reflux disease without esophagitis: Secondary | ICD-10-CM | POA: Diagnosis not present

## 2017-02-19 DIAGNOSIS — F419 Anxiety disorder, unspecified: Secondary | ICD-10-CM | POA: Diagnosis not present

## 2017-02-19 DIAGNOSIS — F329 Major depressive disorder, single episode, unspecified: Secondary | ICD-10-CM | POA: Insufficient documentation

## 2017-02-19 DIAGNOSIS — E876 Hypokalemia: Secondary | ICD-10-CM | POA: Diagnosis not present

## 2017-02-19 DIAGNOSIS — Z79899 Other long term (current) drug therapy: Secondary | ICD-10-CM | POA: Insufficient documentation

## 2017-02-19 DIAGNOSIS — E43 Unspecified severe protein-calorie malnutrition: Secondary | ICD-10-CM | POA: Diagnosis not present

## 2017-02-19 DIAGNOSIS — K259 Gastric ulcer, unspecified as acute or chronic, without hemorrhage or perforation: Secondary | ICD-10-CM | POA: Insufficient documentation

## 2017-02-19 DIAGNOSIS — K2950 Unspecified chronic gastritis without bleeding: Secondary | ICD-10-CM | POA: Insufficient documentation

## 2017-02-19 DIAGNOSIS — K222 Esophageal obstruction: Secondary | ICD-10-CM | POA: Diagnosis not present

## 2017-02-19 DIAGNOSIS — F17210 Nicotine dependence, cigarettes, uncomplicated: Secondary | ICD-10-CM | POA: Insufficient documentation

## 2017-02-19 DIAGNOSIS — Z980 Intestinal bypass and anastomosis status: Secondary | ICD-10-CM | POA: Diagnosis not present

## 2017-02-19 DIAGNOSIS — K255 Chronic or unspecified gastric ulcer with perforation: Secondary | ICD-10-CM

## 2017-02-19 HISTORY — PX: ESOPHAGOGASTRODUODENOSCOPY (EGD) WITH PROPOFOL: SHX5813

## 2017-02-19 LAB — POCT PREGNANCY, URINE: Preg Test, Ur: NEGATIVE

## 2017-02-19 SURGERY — ESOPHAGOGASTRODUODENOSCOPY (EGD) WITH PROPOFOL
Anesthesia: General

## 2017-02-19 MED ORDER — ONDANSETRON HCL 4 MG/2ML IJ SOLN
4.0000 mg | Freq: Once | INTRAMUSCULAR | Status: AC
  Administered 2017-02-19: 4 mg via INTRAVENOUS

## 2017-02-19 MED ORDER — LIDOCAINE HCL (PF) 2 % IJ SOLN
INTRAMUSCULAR | Status: AC
Start: 2017-02-19 — End: ?
  Filled 2017-02-19: qty 2

## 2017-02-19 MED ORDER — PROPOFOL 10 MG/ML IV BOLUS
INTRAVENOUS | Status: DC | PRN
  Administered 2017-02-19 (×2): 30 mg via INTRAVENOUS

## 2017-02-19 MED ORDER — PROPOFOL 500 MG/50ML IV EMUL
INTRAVENOUS | Status: DC | PRN
  Administered 2017-02-19: 120 ug/kg/min via INTRAVENOUS

## 2017-02-19 MED ORDER — SODIUM CHLORIDE 0.9 % IV SOLN
INTRAVENOUS | Status: DC
  Administered 2017-02-19: 11:00:00 via INTRAVENOUS

## 2017-02-19 MED ORDER — PROPOFOL 10 MG/ML IV BOLUS
INTRAVENOUS | Status: AC
  Filled 2017-02-19: qty 20

## 2017-02-19 MED ORDER — ONDANSETRON HCL 4 MG/2ML IJ SOLN
INTRAMUSCULAR | Status: AC
  Administered 2017-02-19: 4 mg via INTRAVENOUS
  Filled 2017-02-19: qty 2

## 2017-02-19 NOTE — Anesthesia Preprocedure Evaluation (Signed)
Anesthesia Evaluation  Patient identified by MRN, date of birth, ID band  Reviewed: Allergy & Precautions, NPO status , Patient's Chart, lab work & pertinent test results  Airway Mallampati: II       Dental  (+) Teeth Intact   Pulmonary COPD, Current Smoker,    breath sounds clear to auscultation       Cardiovascular Exercise Tolerance: Good  Rhythm:Regular     Neuro/Psych Anxiety Depression    GI/Hepatic Neg liver ROS, PUD, GERD  Medicated,  Endo/Other  negative endocrine ROS  Renal/GU negative Renal ROS     Musculoskeletal   Abdominal Normal abdominal exam  (+)   Peds negative pediatric ROS (+)  Hematology negative hematology ROS (+)   Anesthesia Other Findings   Reproductive/Obstetrics                             Anesthesia Physical Anesthesia Plan  ASA: II  Anesthesia Plan: General   Post-op Pain Management:    Induction:   PONV Risk Score and Plan: 0  Airway Management Planned: Natural Airway and Nasal Cannula  Additional Equipment:   Intra-op Plan:   Post-operative Plan:   Informed Consent: I have reviewed the patients History and Physical, chart, labs and discussed the procedure including the risks, benefits and alternatives for the proposed anesthesia with the patient or authorized representative who has indicated his/her understanding and acceptance.     Plan Discussed with: Surgeon  Anesthesia Plan Comments:         Anesthesia Quick Evaluation

## 2017-02-19 NOTE — Op Note (Signed)
[facility] Gastroenterology Patient Name: Lindsey Wolf Procedure Date: 02/19/2017 10:36 AM MRN: [id number] Account #: [id number] Date of Birth: 28-Oct-1965 Admit Type: Ambulatory Age: 51 Room: [facility] ENDO ROOM 1 Gender: Female Note Status: Finalized Procedure:            Upper GI endoscopy Indications:          Follow-up of gastric ulcer Providers:            Jonathon Bellows MD, MD Referring MD:         Tracie Harrier, MD (Referring MD) Medicines:            Monitored Anesthesia Care Complications:        No immediate complications. Procedure:            Pre-Anesthesia Assessment:                       - Prior to the procedure, a History and Physical was                        performed, and patient medications, allergies and                        sensitivities were reviewed. The patient's tolerance of                        previous anesthesia was reviewed.                       - The risks and benefits of the procedure and the                        sedation options and risks were discussed with the                        patient. All questions were answered and informed                        consent was obtained.                       - ASA Grade Assessment: II - A patient with mild                        systemic disease.                       After obtaining informed consent, the endoscope was                        passed under direct vision. Throughout the procedure,                        the patient's blood pressure, pulse, and oxygen                        saturations were monitored continuously. The                        Colonoscope was introduced through the mouth, and  advanced to the third part of duodenum. The upper GI                        endoscopy was accomplished with ease. The patient                        tolerated the procedure well. Findings:      The examined duodenum was normal.      The esophagus was normal.     Evidence of a prior gastric surgery were found in the gastric       antrum.Sutures were visible- likely siteof recent gastric ulcer       perforation. No evidence of ulcer [Description]. Biopsies were taken       with a cold forceps for histology.      A mild Schatzki ring (acquired) was found at the gastroesophageal       junction. Impression:           - Normal examined duodenum.                       - Normal esophagus.                       - Evidence of previous gastric surgery was found,                        characterized by healthy appearing mucosa. Biopsied. Recommendation:       - Discharge patient to home (with escort).                       - Resume previous diet.                       - Continue present medications.                       - Await pathology results.                       - Return to my office PRN. Procedure Code(s):    --- Professional ---                       [phone], Esophagogastroduodenoscopy, flexible, transoral;                        with biopsy, single or multiple Diagnosis Code(s):    --- Professional ---                       Z98.0, Intestinal bypass and anastomosis status                       K25.9, Gastric ulcer, unspecified as acute or chronic,                        without hemorrhage or perforation CPT copyright 2016 American Medical Association. All rights reserved. The codes documented in this report are preliminary and upon coder review may  be revised to meet current compliance requirements. Jonathon Bellows, MD Jonathon Bellows MD, MD 02/19/2017 10:52:21 AM This report has been signed electronically. Number of Addenda: 0 Note Initiated On: 02/19/2017 10:36 AM  [facility]

## 2017-02-19 NOTE — Transfer of Care (Signed)
Immediate Anesthesia Transfer of Care Note  Patient: Lindsey Wolf  Procedure(s) Performed: Procedure(s): ESOPHAGOGASTRODUODENOSCOPY (EGD) WITH PROPOFOL (N/A)  Patient Location: PACU  Anesthesia Type:General  Level of Consciousness: awake  Airway & Oxygen Therapy: Patient Spontanous Breathing and Patient connected to nasal cannula oxygen  Post-op Assessment: Report given to RN and Post -op Vital signs reviewed and stable  Post vital signs: Reviewed  Last Vitals:  Vitals:   02/19/17 1035  BP: 117/72  Pulse: 79  Resp: 14  Temp: 36.6 C  SpO2: 100%    Last Pain:  Vitals:   02/19/17 1035  TempSrc: Tympanic         Complications: No apparent anesthesia complications

## 2017-02-19 NOTE — OR Nursing (Signed)
C/0 nausea , Zofran adm. Iv and drinking soda now. Nusea relieved.

## 2017-02-19 NOTE — Anesthesia Post-op Follow-up Note (Signed)
Anesthesia QCDR form completed.        

## 2017-02-19 NOTE — H&P (Signed)
Jonathon Bellows MD [address], [place], [place] [address] Phone: [phone] Fax : [phone]  Primary Care Physician:  Tracie Harrier, MD Primary Gastroenterologist:  Dr. Jonathon Bellows   Pre-Procedure History & Physical: HPI:  Lindsey Wolf is a 51 y.o. female is here for an endoscopy.   Past Medical History:  Diagnosis Date  . Abnormal respirations   . Abscess of abdominal cavity ([place])   . Anxiety   . Depression   . Empyema of left pleural space ([place])   . GERD (gastroesophageal reflux disease)   . Hypokalemia   . Perforated ulcer ([place])   . Pleural effusion   . Protein-calorie malnutrition, severe 08/17/2016  . Respiratory distress     Past Surgical History:  Procedure Laterality Date  . APPLICATION OF WOUND VAC N/A 08/12/2016   Procedure: ,remove 2  JP drains,add one pennrose;  Surgeon: Florene Glen, MD;  Location: [facility];  Service: General;  Laterality: N/A;  . CHEST TUBE INSERTION Right 08/08/2016   Procedure: CHEST TUBE INSERTION;  Surgeon: [name], MD;  Location: [facility];  Service: General;  Laterality: Right;  . DEBRIDEMENT OF ABDOMINAL WALL ABSCESS N/A 08/08/2016   Procedure: DEBRIDEMENT OF ABDOMINAL WALL ABSCESS;  Surgeon: Florene Glen, MD;  Location: [facility];  Service: General;  Laterality: N/A;  . LAPAROTOMY N/A 07/20/2016   Procedure: EXPLORATORY LAPAROTOMY;  Surgeon: Jules Husbands, MD;  Location: [facility];  Service: General;  Laterality: N/A;  . LAPAROTOMY N/A 07/30/2016   Procedure: EXPLORATORY LAPAROTOMY drainage peritoneal abscess;  Surgeon: Clayburn Pert, MD;  Location: [facility];  Service: General;  Laterality: N/A;  . LYSIS OF ADHESION  07/30/2016   Procedure: LYSIS OF ADHESION;  Surgeon: Clayburn Pert, MD;  Location: [facility];  Service: General;;  . VIDEO ASSISTED THORACOSCOPY (VATS)/THOROCOTOMY Left 08/08/2016   Procedure: VIDEO ASSISTED THORACOSCOPY (VATS)/THOROCOTOMY Possible Thoracotomy;  Surgeon: [name], MD;  Location:  [facility];  Service: General;  Laterality: Left;    Prior to Admission medications   Medication Sig Start Date End Date Taking? Authorizing Provider  ALPRAZolam Duanne Moron) 0.5 MG tablet Take by mouth. 12/26/16  Yes [provider]  ipratropium-albuterol (DUONEB) 0.5-2.5 (3) MG/3ML SOLN Take 3 mLs by nebulization every 4 (four) hours as needed. 09/03/16  Yes Pabon, Diego F, MD  iron polysaccharides (NIFEREX) 150 MG capsule Take 1 capsule by mouth 1 day or 1 dose. 11/19/16 11/19/17 Yes [provider]  mirtazapine (REMERON) 15 MG tablet Take 1 tablet by mouth at bedtime. 10/19/16 10/19/17 Yes [provider]  pantoprazole (PROTONIX) 40 MG tablet TAKE 1 TABLET BY MOUTH TWICE DAILY 11/23/16  Yes Florene Glen, MD  potassium chloride SA (K-DUR,KLOR-CON) 20 MEQ tablet Take 1 tablet by mouth 2 (two) times daily. 10/05/16  Yes [provider]  venlafaxine XR (EFFEXOR-XR) 75 MG 24 hr capsule Take 75 mg by mouth once. 09/28/16 09/28/17 Yes [provider]  gabapentin (NEURONTIN) 600 MG tablet Take 1 tablet (600 mg total) by mouth 3 (three) times daily. Patient not taking: Reported on 01/02/2017 11/08/16   Pabon, Bea Graff F, MD  ondansetron (ZOFRAN-ODT) 4 MG disintegrating tablet Take 1 tablet (4 mg total) by mouth every 6 (six) hours as needed for nausea. Patient not taking: Reported on 02/19/2017 09/03/16   Jules Husbands, MD  traMADol (ULTRAM) 50 MG tablet Take 1 tablet (50 mg total) by mouth every 6 (six) hours as needed. Patient not taking: Reported on 01/02/2017 11/19/16   Pabon,  Marjory Lies, MD    Allergies as of 01/02/2017  . (No Known Allergies)    Family History  Problem Relation Age of Onset  . Cancer Mother        Lung  . COPD Mother   . Emphysema Mother   . Neuropathy Mother   . Anxiety disorder Mother   . Depression Mother   . Heart disease Father 34       Heart Attack  . Cancer Maternal Grandmother        Doesn't remember  . Emphysema Maternal Aunt   . Cancer  Other        Doesn't remember    Social History   Social History  . Marital status: Single    Spouse name: N/A  . Number of children: N/A  . Years of education: N/A   Occupational History  . Not on file.   Social History Main Topics  . Smoking status: Current Every Day Smoker    Packs/day: 1.00    Years: 11.00    Types: Cigarettes  . Smokeless tobacco: Never Used  . Alcohol use No  . Drug use: No  . Sexual activity: Not on file   Other Topics Concern  . Not on file   Social History Narrative  . No narrative on file    Review of Systems: See HPI, otherwise negative ROS  Physical Exam: LMP 02/19/2005  General:   Alert,  pleasant and cooperative in NAD Head:  Normocephalic and atraumatic. Neck:  Supple; no masses or thyromegaly. Lungs:  Clear throughout to auscultation.    Heart:  Regular rate and rhythm. Abdomen:  Soft, nontender and nondistended. Normal bowel sounds, without guarding, and without rebound.   Neurologic:  Alert and  oriented x4;  grossly normal neurologically.  Impression/Plan: Lindsey Wolf is here for an endoscopy to be performed for checking for healing of gastric ulcer.   Risks, benefits, limitations, and alternatives regarding  endoscopy have been reviewed with the patient.  Questions have been answered.  All parties agreeable.   Jonathon Bellows, MD  02/19/2017, 10:22 AM

## 2017-02-19 NOTE — Anesthesia Postprocedure Evaluation (Signed)
Anesthesia Post Note  Patient: Kearra Calkin Guarisco  Procedure(s) Performed: Procedure(s) (LRB): ESOPHAGOGASTRODUODENOSCOPY (EGD) WITH PROPOFOL (N/A)  Patient location during evaluation: PACU Anesthesia Type: General Level of consciousness: awake Pain management: pain level controlled Vital Signs Assessment: post-procedure vital signs reviewed and stable Respiratory status: nonlabored ventilation Cardiovascular status: stable Anesthetic complications: no     Last Vitals:  Vitals:   02/19/17 1110 02/19/17 1120  BP: 131/87 131/87  Pulse:  67  Resp:  15  Temp:    SpO2:  100%    Last Pain:  Vitals:   02/19/17 1050  TempSrc: Tympanic                 VAN STAVEREN,Breyton Vanscyoc

## 2017-02-20 ENCOUNTER — Encounter: Payer: Self-pay | Admitting: Gastroenterology

## 2017-02-20 LAB — SURGICAL PATHOLOGY

## 2017-02-22 ENCOUNTER — Ambulatory Visit (INDEPENDENT_AMBULATORY_CARE_PROVIDER_SITE_OTHER): Payer: BLUE CROSS/BLUE SHIELD | Admitting: Surgery

## 2017-02-22 VITALS — BP 118/72 | HR 91 | Temp 97.5°F | Ht 69.0 in | Wt 150.6 lb

## 2017-02-22 DIAGNOSIS — K279 Peptic ulcer, site unspecified, unspecified as acute or chronic, without hemorrhage or perforation: Secondary | ICD-10-CM

## 2017-02-22 NOTE — Patient Instructions (Signed)
We will need to see you back in office. You look great!  However if you need [place] for anything please give our office a call.

## 2017-02-22 NOTE — Progress Notes (Signed)
Outpatient Surgical Follow Up  02/22/2017  Lindsey Wolf is an 51 y.o. female.   Chief Complaint  Patient presents with  . Routine Post Op    Drain pulled Muliple surgies    HPI: S/p Laparotomy and repair of perforated ulcers complicated by empyema and multiple abdominal operations. She now has all her issues resolved recent EGD personal review no evidence of ulcers. No cancer. Discussed with the patient in detail about endoscopic findings. She is doing great and is asymptomatic at this point.  Past Medical History:  Diagnosis Date  . Abnormal respirations   . Abscess of abdominal cavity ([place])   . Anxiety   . Depression   . Empyema of left pleural space ([place])   . GERD (gastroesophageal reflux disease)   . Hypokalemia   . Perforated ulcer ([place])   . Pleural effusion   . Protein-calorie malnutrition, severe 08/17/2016  . Respiratory distress     Past Surgical History:  Procedure Laterality Date  . APPLICATION OF WOUND VAC N/A 08/12/2016   Procedure: ,remove 2  JP drains,add one pennrose;  Surgeon: Florene Glen, MD;  Location: [facility];  Service: General;  Laterality: N/A;  . CHEST TUBE INSERTION Right 08/08/2016   Procedure: CHEST TUBE INSERTION;  Surgeon: [name], MD;  Location: [facility];  Service: General;  Laterality: Right;  . DEBRIDEMENT OF ABDOMINAL WALL ABSCESS N/A 08/08/2016   Procedure: DEBRIDEMENT OF ABDOMINAL WALL ABSCESS;  Surgeon: Florene Glen, MD;  Location: [facility];  Service: General;  Laterality: N/A;  . ESOPHAGOGASTRODUODENOSCOPY (EGD) WITH PROPOFOL N/A 02/19/2017   Procedure: ESOPHAGOGASTRODUODENOSCOPY (EGD) WITH PROPOFOL;  Surgeon: Jonathon Bellows, MD;  Location[facility];  Service: Gastroenterology;  Laterality: N/A;  . LAPAROTOMY N/A 07/20/2016   Procedure: EXPLORATORY LAPAROTOMY;  Surgeon: Jules Husbands, MD;  Location: [facility];  Service: General;  Laterality: N/A;  . LAPAROTOMY N/A 07/30/2016   Procedure: EXPLORATORY LAPAROTOMY drainage  peritoneal abscess;  Surgeon: Clayburn Pert, MD;  Location: [facility];  Service: General;  Laterality: N/A;  . LYSIS OF ADHESION  07/30/2016   Procedure: LYSIS OF ADHESION;  Surgeon: Clayburn Pert, MD;  Location: [facility];  Service: General;;  . VIDEO ASSISTED THORACOSCOPY (VATS)/THOROCOTOMY Left 08/08/2016   Procedure: VIDEO ASSISTED THORACOSCOPY (VATS)/THOROCOTOMY Possible Thoracotomy;  Surgeon: [name], MD;  Location: [facility];  Service: General;  Laterality: Left;    Family History  Problem Relation Age of Onset  . Cancer Mother        Lung  . COPD Mother   . Emphysema Mother   . Neuropathy Mother   . Anxiety disorder Mother   . Depression Mother   . Heart disease Father 25       Heart Attack  . Cancer Maternal Grandmother        Doesn't remember  . Emphysema Maternal Aunt   . Cancer Other        Doesn't remember    Social History:  reports that she has been smoking Cigarettes.  She has a 11.00 pack-year smoking history. She has never used smokeless tobacco. She reports that she does not drink alcohol or use drugs.  Allergies: No Known Allergies  Medications reviewed.    ROS Full ROS performed and is otherwise negative other than what is stated in HPI   LMP 02/19/2005   Physical Exam  Constitutional: She is well-developed, well-nourished, and in no distress.  Pulmonary/Chest: Effort normal. No respiratory distress. She exhibits no tenderness.  Thoracotomy scar well healed  Abdominal: Soft. There is no tenderness. There is no rebound and no guarding.  Laparotomy scar well healed  Neurological: She is alert. Gait normal. GCS score is 15.  Skin: Skin is warm and dry.  Psychiatric: Mood, memory, affect and judgment normal.  Nursing note and vitals reviewed.   Assessment/Plan:  1. Peptic ulcer. Resolved Avoid NSaids No surgical issues RTC prn  Caroleen Hamman, MD [facility] General Surgeon

## 2017-03-04 ENCOUNTER — Encounter: Payer: Self-pay | Admitting: Gastroenterology

## 2017-09-30 ENCOUNTER — Other Ambulatory Visit: Payer: Self-pay

## 2017-10-03 ENCOUNTER — Ambulatory Visit: Payer: BLUE CROSS/BLUE SHIELD | Admitting: Surgery

## 2017-10-03 ENCOUNTER — Other Ambulatory Visit
Admission: RE | Admit: 2017-10-03 | Discharge: 2017-10-03 | Disposition: A | Discharge status: Home / Self Care | Payer: BLUE CROSS/BLUE SHIELD | Source: Ambulatory Visit | Attending: Surgery | Admitting: Surgery

## 2017-10-03 ENCOUNTER — Encounter: Payer: Self-pay | Admitting: Surgery

## 2017-10-03 VITALS — BP 148/88 | HR 93 | Temp 98.0°F | Ht 69.0 in | Wt 187.0 lb

## 2017-10-03 DIAGNOSIS — K431 Incisional hernia with gangrene: Secondary | ICD-10-CM

## 2017-10-03 LAB — COMPREHENSIVE METABOLIC PANEL
ALBUMIN: 3.9 g/dL (ref 3.5–5.0)
ALT: 24 U/L (ref 14–54)
AST: 22 U/L (ref 15–41)
Alkaline Phosphatase: 103 U/L (ref 38–126)
Anion gap: 8 (ref 5–15)
BILIRUBIN TOTAL: 0.5 mg/dL (ref 0.3–1.2)
BUN: 17 mg/dL (ref 6–20)
CHLORIDE: 105 mmol/L (ref 101–111)
CO2: 26 mmol/L (ref 22–32)
CREATININE: 0.56 mg/dL (ref 0.44–1.00)
Calcium: 9.1 mg/dL (ref 8.9–10.3)
GFR calc Af Amer: >60 mL/min (ref >60)
GLUCOSE: 86 mg/dL (ref 65–99)
Potassium: 4 mmol/L (ref 3.5–5.1)
Sodium: 139 mmol/L (ref 135–145)
Total Protein: 7 g/dL (ref 6.5–8.1)

## 2017-10-03 LAB — CBC WITH DIFFERENTIAL/PLATELET
Basophils Absolute: 0 10*3/uL (ref 0–0.1)
Basophils Relative: 1 %
EOS ABS: 0.2 10*3/uL (ref 0–0.7)
EOS PCT: 2 %
HCT: 38 % (ref 35.0–47.0)
Hemoglobin: 13.2 g/dL (ref 12.0–16.0)
LYMPHS ABS: 2.8 10*3/uL (ref 1.0–3.6)
Lymphocytes Relative: 39 %
MCH: 31.5 pg (ref 26.0–34.0)
MCHC: 34.8 g/dL (ref 32.0–36.0)
MCV: 90.3 fL (ref 80.0–100.0)
MONO ABS: 0.7 10*3/uL (ref 0.2–0.9)
Monocytes Relative: 11 %
Neutro Abs: 3.4 10*3/uL (ref 1.4–6.5)
Neutrophils Relative %: 47 %
PLATELETS: 305 10*3/uL (ref 150–440)
RBC: 4.21 MIL/uL (ref 3.80–5.20)
RDW: 14.7 % — AB (ref 11.5–14.5)
WBC: 7.1 10*3/uL (ref 3.6–11.0)

## 2017-10-03 LAB — PROTIME-INR
INR: 0.87
Prothrombin Time: 11.8 seconds (ref 11.4–15.2)

## 2017-10-03 NOTE — Patient Instructions (Signed)
Please go by the lab today.  Stop by Radiology and pick up prep kit for the CT on 10/11/17 @ 10:30. Please do not eat/drink 4 hours prior to having scan.   Please see your follow up appointment listed below.

## 2017-10-03 NOTE — Progress Notes (Signed)
Patient ID: Lindsey Wolf, female   DOB: [birth year], 52 y.o.   MRN: [id number]  HPI Lindsey Wolf is a 52 y.o. female seen in consultation at the request of Dr. Leonides Schanz.  She is well-known to [place] for a previous abdominal catastrophe her initial culprit was a perforated ulcer that required [facility] patch and subsequently another operation for persistent leak.  Patient also developed a left empyema requiring a thoracotomy and decortication.  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 She now reports her last few months has available which to the right side of her abdominal wall.  There is some discomfort no evidence of obstruction some intermittent abdominal pain. Her overall status and nutritional status has improved dramatically.  She is able to perform more than 4 METS of activity without any shortness of breath and she has gained her weight back.   HPI  Past Medical History:  Diagnosis Date  . Abnormal respirations   . Abscess of abdominal cavity ([place])   . Anxiety   . Depression   . Empyema of left pleural space ([place])   . GERD (gastroesophageal reflux disease)   . Hypokalemia   . Perforated ulcer ([place])   . Pleural effusion   . Protein-calorie malnutrition, severe 08/17/2016  . Respiratory distress     Past Surgical History:  Procedure Laterality Date  . APPLICATION OF WOUND VAC N/A 08/12/2016   Procedure: ,remove 2  JP drains,add one pennrose;  Surgeon: Florene Glen, MD;  Location: [facility];  Service: General;  Laterality: N/A;  . CHEST TUBE INSERTION Right 08/08/2016   Procedure: CHEST TUBE INSERTION;  Surgeon: [name], MD;  Location: [facility];  Service: General;  Laterality: Right;  . DEBRIDEMENT OF ABDOMINAL WALL ABSCESS N/A 08/08/2016   Procedure: DEBRIDEMENT OF ABDOMINAL WALL ABSCESS;  Surgeon: Florene Glen, MD;  Location: [facility];  Service: General;  Laterality: N/A;  .  ESOPHAGOGASTRODUODENOSCOPY (EGD) WITH PROPOFOL N/A 02/19/2017   Procedure: ESOPHAGOGASTRODUODENOSCOPY (EGD) WITH PROPOFOL;  Surgeon: Jonathon Bellows, MD;  Location: [facility] ENDOSCOPY;  Service: Gastroenterology;  Laterality: N/A;  . LAPAROTOMY N/A 07/20/2016   Procedure: EXPLORATORY LAPAROTOMY;  Surgeon: Jules Husbands, MD;  Location: [facility];  Service: General;  Laterality: N/A;  . LAPAROTOMY N/A 07/30/2016   Procedure: EXPLORATORY LAPAROTOMY drainage peritoneal abscess;  Surgeon: Clayburn Pert, MD;  Location: [facility];  Service: General;  Laterality: N/A;  . LYSIS OF ADHESION  07/30/2016   Procedure: LYSIS OF ADHESION;  Surgeon: Clayburn Pert, MD;  Location: [facility];  Service: General;;  . VIDEO ASSISTED THORACOSCOPY (VATS)/THOROCOTOMY Left 08/08/2016   Procedure: VIDEO ASSISTED THORACOSCOPY (VATS)/THOROCOTOMY Possible Thoracotomy;  Surgeon: [name], MD;  Location: [facility];  Service: General;  Laterality: Left;    Family History  Problem Relation Age of Onset  . Cancer Mother        Lung  . COPD Mother   . Emphysema Mother   . Neuropathy Mother   . Anxiety disorder Mother   . Depression Mother   . Heart disease Father 73       Heart Attack  . Cancer Maternal Grandmother        Doesn't remember  . Emphysema Maternal Aunt   . Cancer Other        Doesn't remember    Social History Social History   Tobacco Use  . Smoking status: Current Every Day Smoker    Packs/day: 1.00  Years: 11.00    Pack years: 11.00    Types: Cigarettes  . Smokeless tobacco: Never Used  Substance Use Topics  . Alcohol use: No  . Drug use: No    No Known Allergies  Current Outpatient Medications  Medication Sig Dispense Refill  . ALPRAZolam (XANAX) 0.5 MG tablet Take by mouth.    . iron polysaccharides (NU-IRON) 150 MG capsule Take by mouth.    . Olopatadine HCl 0.2 % SOLN Place 0.2 Units into both eyes daily.  0  . pantoprazole (PROTONIX) 40 MG tablet TAKE 1 TABLET BY MOUTH TWICE DAILY 60  tablet 1  . venlafaxine XR (EFFEXOR-XR) 75 MG 24 hr capsule Take 75 mg by mouth once.     No current facility-administered medications for this visit.      Review of Systems Full ROS  was asked and was negative except for the information on the HPI  Physical Exam Blood pressure (!) 148/88, pulse 93, temperature 98 F (36.7 C), temperature source Oral, height 5\' 9"  (1.753 m), weight 84.8 kg (187 lb), last menstrual period 02/19/2005. CONSTITUTIONAL: NAD EYES: Pupils are equal, round, and reactive to light, Sclera are non-icteric. EARS, NOSE, MOUTH AND THROAT: The oropharynx is clear. The oral mucosa is pink and moist. Hearing is intact to voice. LYMPH NODES:  Lymph nodes in the neck are normal. RESPIRATORY:  Lungs are clear. There is normal respiratory effort, with equal breath sounds bilaterally, and without pathologic use of accessory muscles. CARDIOVASCULAR: Heart is regular without murmurs, gallops, or rubs. GI: The abdomen is soft, There is loss of domains, large two ventral hernias significant deformity within the abdominal wall, and thin midline wound. GU: Rectal deferred.   MUSCULOSKELETAL: Normal muscle strength and tone. No cyanosis or edema.   SKIN: Turgor is good and there are no pathologic skin lesions or ulcers. NEUROLOGIC: Motor and sensation is grossly normal. Cranial nerves are grossly intact. PSYCH:  Oriented to person, place and time. Affect is normal.  Data Reviewed  I have personally reviewed the patient's imaging, laboratory findings and medical records.    Assessment/Plan Large ventral hernia following abdominal catastrophe now with loss of domain.  First order business is to establish anatomical map of her abdominal wall.  I will order a CT scan of the abdomen and pelvis and also will order recent labs to assess for her nutritional status and overall condition.  I will see her back in a couple weeks with imaging studies and lab studies.  She will likely require  an extensive surgery including component separation with TAr release and mesh placement. My medical and nutritional perspective she seems to be as good as she is going to get.  She understand that this would not be a major undertaking and prolonged surgery and recovery.  Caroleen Hamman, MD FACS General Surgeon 10/03/2017, 4:02 PM

## 2017-10-04 ENCOUNTER — Encounter: Payer: Self-pay | Admitting: Surgery

## 2017-10-11 ENCOUNTER — Ambulatory Visit
Admission: RE | Admit: 2017-10-11 | Discharge: 2017-10-11 | Disposition: A | Discharge status: Home / Self Care | Payer: BLUE CROSS/BLUE SHIELD | Source: Ambulatory Visit | Attending: Surgery | Admitting: Surgery

## 2017-10-11 DIAGNOSIS — K431 Incisional hernia with gangrene: Secondary | ICD-10-CM | POA: Insufficient documentation

## 2017-10-11 DIAGNOSIS — K429 Umbilical hernia without obstruction or gangrene: Secondary | ICD-10-CM | POA: Insufficient documentation

## 2017-10-11 MED ORDER — IOPAMIDOL (ISOVUE-300) INJECTION 61%
100.0000 mL | Freq: Once | INTRAVENOUS | Status: AC | PRN
  Administered 2017-10-11: 100 mL via INTRAVENOUS

## 2017-10-16 ENCOUNTER — Encounter: Payer: Self-pay | Admitting: Surgery

## 2017-10-16 ENCOUNTER — Ambulatory Visit (INDEPENDENT_AMBULATORY_CARE_PROVIDER_SITE_OTHER): Payer: BLUE CROSS/BLUE SHIELD | Admitting: Surgery

## 2017-10-16 VITALS — BP 126/85 | HR 69 | Temp 98.3°F | Wt 188.0 lb

## 2017-10-16 DIAGNOSIS — K432 Incisional hernia without obstruction or gangrene: Secondary | ICD-10-CM | POA: Diagnosis not present

## 2017-10-16 NOTE — Progress Notes (Signed)
Patient ID: Lindsey Wolf, female   DOB: 10/12/1965, 52 y.o.   MRN: [id number]  HPI Lindsey Wolf is a 52 y.o. female following up after CT scan and labs performed. Labs are normal including a normal coags and albumin.  The scan showing a large ventral hernia with loss of domain.  No more acute interabdominal pathology.  She is well-known to [place] for a previous abdominal catastrophe her initial culprit was a perforated ulcer that required [facility] patch and subsequently another operation for persistent leak.  Patient also developed a left empyema requiring a thoracotomy and decortication.  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 She now reports her last few months has available which to the right side of her abdominal wall.  There is some discomfort no evidence of obstruction some intermittent abdominal pain. Her overall status and nutritional status has improved dramatically.  She is able to perform more than 4 METS of activity without any shortness of breath and she has gained her weight back.    HPI  Past Medical History:  Diagnosis Date  . Abnormal respirations   . Abscess of abdominal cavity ([place])   . Anxiety   . Depression   . Empyema of left pleural space ([place])   . GERD (gastroesophageal reflux disease)   . Hypokalemia   . Perforated ulcer ([place])   . Pleural effusion   . Protein-calorie malnutrition, severe 08/17/2016  . Respiratory distress     Past Surgical History:  Procedure Laterality Date  . APPLICATION OF WOUND VAC N/A 08/12/2016   Procedure: ,remove 2  JP drains,add one pennrose;  Surgeon: Florene Glen, MD;  Location: [facility];  Service: General;  Laterality: N/A;  . CHEST TUBE INSERTION Right 08/08/2016   Procedure: CHEST TUBE INSERTION;  Surgeon: [name], MD;  Location: [facility];  Service: General;  Laterality: Right;  . DEBRIDEMENT OF ABDOMINAL WALL ABSCESS N/A  08/08/2016   Procedure: DEBRIDEMENT OF ABDOMINAL WALL ABSCESS;  Surgeon: Florene Glen, MD;  Location: [facility];  Service: General;  Laterality: N/A;  . ESOPHAGOGASTRODUODENOSCOPY (EGD) WITH PROPOFOL N/A 02/19/2017   Procedure: ESOPHAGOGASTRODUODENOSCOPY (EGD) WITH PROPOFOL;  Surgeon: Jonathon Bellows, MD;  Location: [facility] ENDOSCOPY;  Service: Gastroenterology;  Laterality: N/A;  . LAPAROTOMY N/A 07/20/2016   Procedure: EXPLORATORY LAPAROTOMY;  Surgeon: Jules Husbands, MD;  Location: [facility];  Service: General;  Laterality: N/A;  . LAPAROTOMY N/A 07/30/2016   Procedure: EXPLORATORY LAPAROTOMY drainage peritoneal abscess;  Surgeon: Clayburn Pert, MD;  Location: [facility];  Service: General;  Laterality: N/A;  . LYSIS OF ADHESION  07/30/2016   Procedure: LYSIS OF ADHESION;  Surgeon: Clayburn Pert, MD;  Location: [facility];  Service: General;;  . VIDEO ASSISTED THORACOSCOPY (VATS)/THOROCOTOMY Left 08/08/2016   Procedure: VIDEO ASSISTED THORACOSCOPY (VATS)/THOROCOTOMY Possible Thoracotomy;  Surgeon: [name], MD;  Location: [facility];  Service: General;  Laterality: Left;    Family History  Problem Relation Age of Onset  . Cancer Mother        Lung  . COPD Mother   . Emphysema Mother   . Neuropathy Mother   . Anxiety disorder Mother   . Depression Mother   . Heart disease Father 72       Heart Attack  . Cancer Maternal Grandmother        Doesn't remember  . Emphysema Maternal Aunt   . Cancer Other  Doesn't remember    Social History Social History   Tobacco Use  . Smoking status: Current Every Day Smoker    Packs/day: 1.00    Years: 11.00    Pack years: 11.00    Types: Cigarettes  . Smokeless tobacco: Never Used  Substance Use Topics  . Alcohol use: No  . Drug use: No    No Known Allergies  Current Outpatient Medications  Medication Sig Dispense Refill  . ALPRAZolam (XANAX) 0.5 MG tablet Take by mouth.    . iron polysaccharides (NU-IRON) 150 MG capsule Take by mouth.     . Olopatadine HCl 0.2 % SOLN Place 0.2 Units into both eyes daily.  0  . pantoprazole (PROTONIX) 40 MG tablet TAKE 1 TABLET BY MOUTH TWICE DAILY 60 tablet 1  . venlafaxine XR (EFFEXOR-XR) 75 MG 24 hr capsule Take 75 mg by mouth once.     No current facility-administered medications for this visit.      Review of Systems Full ROS  was asked and was negative except for the information on the HPI  Physical Exam Blood pressure 126/85, pulse 69, temperature 98.3 F (36.8 C), temperature source Oral, weight 85.3 kg (188 lb), last menstrual period 02/19/2005. CONSTITUTIONAL: NAD EYES: Pupils are equal, round, and reactive to light, Sclera are non-icteric. EARS, NOSE, MOUTH AND THROAT: The oropharynx is clear. The oral mucosa is pink and moist. Hearing is intact to voice. LYMPH NODES:  Lymph nodes in the neck are normal. RESPIRATORY:  Lungs are clear. There is normal respiratory effort, with equal breath sounds bilaterally, and without pathologic use of accessory muscles. CARDIOVASCULAR: Heart is regular without murmurs, gallops, or rubs. GI: The abdomen is  soft, large incisional hernia with loss of domain.  There is also significant scarring and deformity to the abdominal wall.  Evidence of incarceration or peritonitis.   No ulcerations or open wounds GU: Rectal deferred.   MUSCULOSKELETAL: Normal muscle strength and tone. No cyanosis or edema.   SKIN: Turgor is good and there are no pathologic skin lesions or ulcers. NEUROLOGIC: Motor and sensation is grossly normal. Cranial nerves are grossly intact. PSYCH:  Oriented to person, place and time. Affect is normal.  Data Reviewed  I have personally reviewed the patient's imaging, laboratory findings and medical records.    Assessment  /Plan 52 year old female with symptomatic large incisional hernia with loss of domain. Will definitely benefit from a component separation technique. Discussed with the patient in detail about the  procedure.  She understands this going to be a lengthy surgery with possible complications that are frequent including seroma formation significant and prolonged hospital stay with possible chronic pain.  Other risks including bleeding, infection recurrence and injury to adjacent structures were discussed with the patient in detail. Early as far as her medical status and performance she is as good as she is going to get on she is well optimized from a physiological perspective. Have discussed with the patient detail about the surgery this will be quite complex and in need for a second surgeon.  We will ask Dr. Genevive Bi to be present during this reconstruction. BECAUSE of the complexity of this repair this is all these open repair and not amenable for minimally invasive technique. EXTensive counseling provided  Caroleen Hamman, MD FACS General Surgeon 10/16/2017, 5:39 PM

## 2017-10-16 NOTE — Patient Instructions (Addendum)
We have your surgery scheduled for the week of June 18-21 at [facility] with [facility].  Please see your blue pre-care sheet for surgery information.   Please call with any questions or concerns.

## 2017-10-17 ENCOUNTER — Ambulatory Visit: Payer: Self-pay | Admitting: Surgery

## 2017-10-21 ENCOUNTER — Telehealth: Payer: Self-pay | Admitting: Surgery

## 2017-10-21 NOTE — Telephone Encounter (Signed)
Pt advised of pre op date/time and sx date. Sx: 11/12/17 with Dr Raynelle Fanning repair of abdominal wall. Pre op: 11/05/17 @ 10:15am--office interview.   Patient made aware to call [phone], between 1-3:00pm the day before surgery, to find out what time to arrive.

## 2017-11-04 ENCOUNTER — Telehealth: Payer: Self-pay | Admitting: Surgery

## 2017-11-04 NOTE — Telephone Encounter (Signed)
I have called patient to advise her that her surgery date has been changed per Dr Corlis Leak request. Patient is ok with this change of date.   Pt advised of pre op date/time and sx date. Sx: 11/21/17 with Dr Raynelle Fanning repair of abdominal wall. Pre op: 11/05/17 @ 10:15am--office interview.   Patient made aware to call [phone], between 1-3:00pm the day before surgery, to find out what time to arrive.

## 2017-11-05 ENCOUNTER — Other Ambulatory Visit: Payer: Self-pay

## 2017-11-05 ENCOUNTER — Encounter
Admission: RE | Admit: 2017-11-05 | Discharge: 2017-11-05 | Disposition: A | Discharge status: Home / Self Care | Payer: BLUE CROSS/BLUE SHIELD | Source: Ambulatory Visit | Attending: Surgery | Admitting: Surgery

## 2017-11-05 DIAGNOSIS — Z01818 Encounter for other preprocedural examination: Secondary | ICD-10-CM | POA: Diagnosis present

## 2017-11-05 NOTE — Patient Instructions (Signed)
Your procedure is scheduled YQ:MVHQI 6/20 Report to Day Surgery. To find out your arrival time please call [phone] between 1PM - 3PM on Wed 6/19.  Remember: Instructions that are not followed completely may result in serious medical risk, up to and including death, or upon the discretion of your surgeon and anesthesiologist your surgery may need to be rescheduled.     _X__ 1. Do not eat food after midnight the night before your procedure.                 No gum chewing or hard candies. You may drink clear liquids up to 2 hours                 before you are scheduled to arrive for your surgery- DO not drink clear                 liquids within 2 hours of the start of your surgery.                 Clear Liquids include:  water, apple juice without pulp, clear carbohydrate                 drink such as Clearfast of Gartorade, Black Coffee or Tea (Do not add                 anything to coffee or tea).  __X__2.  On the morning of surgery brush your teeth with toothpaste and water, you may rinse your mouth with mouthwash if you wish.  Do not swallow any              toothpaste of mouthwash.     _X__ 3.  No Alcohol for 24 hours before or after surgery.   _X__ 4.  Do Not Smoke or use e-cigarettes For 24 Hours Prior to Your Surgery.                 Do not use any chewable tobacco products for at least 6 hours prior to                 surgery.  ____  5.  Bring all medications with you on the day of surgery if instructed.   __x__  6.  Notify your doctor if there is any change in your medical condition      (cold, fever, infections).     Do not wear jewelry, make-up, hairpins, clips or nail polish. Do not wear lotions, powders, or perfumes. You may wear deodorant. Do not shave 48 hours prior to surgery. Men may shave face and neck. Do not bring valuables to the hospital.    [facility] is not responsible for any belongings or valuables.  Contacts, dentures or  bridgework may not be worn into surgery. Leave your suitcase in the car. After surgery it may be brought to your room. For patients admitted to the hospital, discharge time is determined by your treatment team.   Patients discharged the day of surgery will not be allowed to drive home.   Please read over the following fact sheets that you were given:    __x__ Take these medicines the morning of surgery with A SIP OF WATER:    1. acetaminophen (TYLENOL) 650 MG CR tablet if needed  2. ALPRAZolam (XANAX) 0.5 MG tablet if needed  3. pantoprazole (PROTONIX) 40 MG tablet night before and morning of surgery  4.venlafaxine XR (EFFEXOR-XR) 75 MG 24 hr capsule  5.Triamcinolone Acetonide (NASACORT ALLERGY 24HR NA)  6.  ____ Fleet Enema (as directed)   _x___ Use CHG Soap as directed  ____ Use inhalers on the day of surgery  ____ Stop metformin 2 days prior to surgery    ____ Take 1/2 of usual insulin dose the night before surgery. No insulin the morning          of surgery.   ____ Stop Coumadin/Plavix/aspirin on   ____ Stop Anti-inflammatories on    ____ Stop supplements until after surgery. Biotin 10000 MCG TABS   ____ Bring C-Pap to the hospital.

## 2017-11-08 DIAGNOSIS — R4589 Other symptoms and signs involving emotional state: Secondary | ICD-10-CM | POA: Insufficient documentation

## 2017-11-08 DIAGNOSIS — F329 Major depressive disorder, single episode, unspecified: Secondary | ICD-10-CM

## 2017-11-08 DIAGNOSIS — R51 Headache: Secondary | ICD-10-CM

## 2017-11-08 DIAGNOSIS — R519 Headache, unspecified: Secondary | ICD-10-CM | POA: Insufficient documentation

## 2017-11-20 MED ORDER — CEFAZOLIN SODIUM-DEXTROSE 2-4 GM/100ML-% IV SOLN
2.0000 g | INTRAVENOUS | Status: AC
  Administered 2017-11-21 (×2): 2 g via INTRAVENOUS

## 2017-11-21 ENCOUNTER — Inpatient Hospital Stay: Payer: BLUE CROSS/BLUE SHIELD | Admitting: Certified Registered Nurse Anesthetist

## 2017-11-21 ENCOUNTER — Encounter: Payer: Self-pay | Admitting: *Deleted

## 2017-11-21 ENCOUNTER — Other Ambulatory Visit: Payer: Self-pay

## 2017-11-21 ENCOUNTER — Encounter
Admission: RE | Disposition: A | Discharge status: Home / Self Care | Payer: Self-pay | Source: Home / Self Care | Attending: Surgery

## 2017-11-21 ENCOUNTER — Inpatient Hospital Stay
Admission: RE | Admit: 2017-11-21 | Discharge: 2017-11-24 | DRG: 337 | Disposition: A | Discharge status: Home / Self Care | Payer: BLUE CROSS/BLUE SHIELD | Attending: Surgery | Admitting: Surgery

## 2017-11-21 DIAGNOSIS — Z8709 Personal history of other diseases of the respiratory system: Secondary | ICD-10-CM

## 2017-11-21 DIAGNOSIS — K219 Gastro-esophageal reflux disease without esophagitis: Secondary | ICD-10-CM | POA: Diagnosis present

## 2017-11-21 DIAGNOSIS — Z825 Family history of asthma and other chronic lower respiratory diseases: Secondary | ICD-10-CM

## 2017-11-21 DIAGNOSIS — K660 Peritoneal adhesions (postprocedural) (postinfection): Secondary | ICD-10-CM | POA: Diagnosis present

## 2017-11-21 DIAGNOSIS — F329 Major depressive disorder, single episode, unspecified: Secondary | ICD-10-CM | POA: Diagnosis present

## 2017-11-21 DIAGNOSIS — K439 Ventral hernia without obstruction or gangrene: Secondary | ICD-10-CM | POA: Diagnosis present

## 2017-11-21 DIAGNOSIS — F419 Anxiety disorder, unspecified: Secondary | ICD-10-CM | POA: Diagnosis present

## 2017-11-21 DIAGNOSIS — Z8719 Personal history of other diseases of the digestive system: Secondary | ICD-10-CM | POA: Diagnosis not present

## 2017-11-21 DIAGNOSIS — Z818 Family history of other mental and behavioral disorders: Secondary | ICD-10-CM

## 2017-11-21 DIAGNOSIS — Z801 Family history of malignant neoplasm of trachea, bronchus and lung: Secondary | ICD-10-CM | POA: Diagnosis not present

## 2017-11-21 DIAGNOSIS — Z820 Family history of epilepsy and other diseases of the nervous system: Secondary | ICD-10-CM

## 2017-11-21 DIAGNOSIS — F17210 Nicotine dependence, cigarettes, uncomplicated: Secondary | ICD-10-CM | POA: Diagnosis present

## 2017-11-21 DIAGNOSIS — Z79899 Other long term (current) drug therapy: Secondary | ICD-10-CM | POA: Diagnosis not present

## 2017-11-21 DIAGNOSIS — Z8249 Family history of ischemic heart disease and other diseases of the circulatory system: Secondary | ICD-10-CM | POA: Diagnosis not present

## 2017-11-21 DIAGNOSIS — K432 Incisional hernia without obstruction or gangrene: Secondary | ICD-10-CM

## 2017-11-21 DIAGNOSIS — Z9889 Other specified postprocedural states: Secondary | ICD-10-CM

## 2017-11-21 HISTORY — PX: ABDOMINAL WALL DEFECT REPAIR: SHX53

## 2017-11-21 LAB — CBC
HEMATOCRIT: 37.8 % (ref 35.0–47.0)
HEMOGLOBIN: 12.9 g/dL (ref 12.0–16.0)
MCH: 31.5 pg (ref 26.0–34.0)
MCHC: 34.1 g/dL (ref 32.0–36.0)
MCV: 92.6 fL (ref 80.0–100.0)
Platelets: 276 10*3/uL (ref 150–440)
RBC: 4.08 MIL/uL (ref 3.80–5.20)
RDW: 13.7 % (ref 11.5–14.5)
WBC: 13.5 10*3/uL — ABNORMAL HIGH (ref 3.6–11.0)

## 2017-11-21 LAB — CREATININE, SERUM: CREATININE: 0.67 mg/dL (ref 0.44–1.00)

## 2017-11-21 SURGERY — REPAIR, ABDOMINAL WALL
Anesthesia: General | Wound class: Clean Contaminated

## 2017-11-21 MED ORDER — BUPIVACAINE-EPINEPHRINE (PF) 0.25% -1:200000 IJ SOLN
INTRAMUSCULAR | Status: AC
  Filled 2017-11-21: qty 30

## 2017-11-21 MED ORDER — ONDANSETRON HCL 4 MG/2ML IJ SOLN: 4.0000 mg | Freq: Four times a day (QID) | INTRAMUSCULAR | Status: DC | PRN

## 2017-11-21 MED ORDER — MIDAZOLAM HCL 2 MG/2ML IJ SOLN
INTRAMUSCULAR | Status: AC
  Filled 2017-11-21: qty 2

## 2017-11-21 MED ORDER — ALBUTEROL SULFATE (2.5 MG/3ML) 0.083% IN NEBU: 2.5000 mg | INHALATION_SOLUTION | Freq: Four times a day (QID) | RESPIRATORY_TRACT | Status: DC | PRN

## 2017-11-21 MED ORDER — FENTANYL CITRATE (PF) 100 MCG/2ML IJ SOLN
INTRAMUSCULAR | Status: DC | PRN
  Administered 2017-11-21: 100 ug via INTRAVENOUS
  Administered 2017-11-21 (×4): 50 ug via INTRAVENOUS

## 2017-11-21 MED ORDER — KETOROLAC TROMETHAMINE 15 MG/ML IJ SOLN
15.0000 mg | Freq: Four times a day (QID) | INTRAMUSCULAR | Status: DC
  Administered 2017-11-21 – 2017-11-23 (×6): 15 mg via INTRAVENOUS
  Filled 2017-11-21 (×6): qty 1

## 2017-11-21 MED ORDER — FENTANYL CITRATE (PF) 100 MCG/2ML IJ SOLN
INTRAMUSCULAR | Status: AC
  Filled 2017-11-21: qty 2

## 2017-11-21 MED ORDER — DEXTROSE IN LACTATED RINGERS 5 % IV SOLN
INTRAVENOUS | Status: DC
  Administered 2017-11-21 – 2017-11-22 (×2): via INTRAVENOUS

## 2017-11-21 MED ORDER — CEFAZOLIN SODIUM-DEXTROSE 2-4 GM/100ML-% IV SOLN
INTRAVENOUS | Status: AC
  Filled 2017-11-21: qty 100

## 2017-11-21 MED ORDER — OXYCODONE HCL 5 MG PO TABS
5.0000 mg | ORAL_TABLET | ORAL | Status: DC | PRN
  Administered 2017-11-21 (×2): 5 mg via ORAL
  Administered 2017-11-22: 10 mg via ORAL
  Filled 2017-11-21: qty 1
  Filled 2017-11-21: qty 2
  Filled 2017-11-21: qty 1

## 2017-11-21 MED ORDER — ENOXAPARIN SODIUM 40 MG/0.4ML ~~LOC~~ SOLN
40.0000 mg | SUBCUTANEOUS | Status: DC
  Administered 2017-11-22 – 2017-11-24 (×3): 40 mg via SUBCUTANEOUS
  Filled 2017-11-21 (×3): qty 0.4

## 2017-11-21 MED ORDER — EVICEL 5 ML EX KIT
PACK | CUTANEOUS | Status: DC | PRN
  Administered 2017-11-21: 5 mL

## 2017-11-21 MED ORDER — ONDANSETRON HCL 4 MG/2ML IJ SOLN
INTRAMUSCULAR | Status: DC | PRN
  Administered 2017-11-21: 4 mg via INTRAVENOUS

## 2017-11-21 MED ORDER — ONDANSETRON 4 MG PO TBDP: 4.0000 mg | ORAL_TABLET | Freq: Four times a day (QID) | ORAL | Status: DC | PRN

## 2017-11-21 MED ORDER — BUPIVACAINE-EPINEPHRINE (PF) 0.25% -1:200000 IJ SOLN
INTRAMUSCULAR | Status: DC | PRN
  Administered 2017-11-21: 30 mL via PERINEURAL

## 2017-11-21 MED ORDER — PROPOFOL 500 MG/50ML IV EMUL
INTRAVENOUS | Status: AC
  Filled 2017-11-21: qty 50

## 2017-11-21 MED ORDER — MIDAZOLAM HCL 2 MG/2ML IJ SOLN
INTRAMUSCULAR | Status: DC | PRN
  Administered 2017-11-21: 2 mg via INTRAVENOUS

## 2017-11-21 MED ORDER — HEPARIN SODIUM (PORCINE) 5000 UNIT/ML IJ SOLN
5000.0000 [IU] | Freq: Once | INTRAMUSCULAR | Status: AC
  Administered 2017-11-21: 5000 [IU] via SUBCUTANEOUS

## 2017-11-21 MED ORDER — ROCURONIUM BROMIDE 100 MG/10ML IV SOLN
INTRAVENOUS | Status: DC | PRN
  Administered 2017-11-21: 20 mg via INTRAVENOUS
  Administered 2017-11-21: 50 mg via INTRAVENOUS
  Administered 2017-11-21: 10 mg via INTRAVENOUS
  Administered 2017-11-21: 30 mg via INTRAVENOUS
  Administered 2017-11-21: 20 mg via INTRAVENOUS

## 2017-11-21 MED ORDER — LACTATED RINGERS IV SOLN: INTRAVENOUS | Status: DC

## 2017-11-21 MED ORDER — CEFAZOLIN SODIUM 1 G IJ SOLR
INTRAMUSCULAR | Status: AC
  Filled 2017-11-21: qty 20

## 2017-11-21 MED ORDER — FENTANYL CITRATE (PF) 100 MCG/2ML IJ SOLN
25.0000 ug | INTRAMUSCULAR | Status: DC | PRN
  Administered 2017-11-21 (×2): 50 ug via INTRAVENOUS

## 2017-11-21 MED ORDER — SUGAMMADEX SODIUM 200 MG/2ML IV SOLN
INTRAVENOUS | Status: DC | PRN
  Administered 2017-11-21: 170 mg via INTRAVENOUS

## 2017-11-21 MED ORDER — CHLORHEXIDINE GLUCONATE CLOTH 2 % EX PADS
6.0000 | MEDICATED_PAD | Freq: Once | CUTANEOUS | Status: AC
  Administered 2017-11-21: 6 via TOPICAL

## 2017-11-21 MED ORDER — PROPOFOL 10 MG/ML IV BOLUS
INTRAVENOUS | Status: DC | PRN
  Administered 2017-11-21: 160 mg via INTRAVENOUS

## 2017-11-21 MED ORDER — SODIUM CHLORIDE FLUSH 0.9 % IV SOLN
INTRAVENOUS | Status: AC
  Filled 2017-11-21: qty 10

## 2017-11-21 MED ORDER — DEXAMETHASONE SODIUM PHOSPHATE 10 MG/ML IJ SOLN
INTRAMUSCULAR | Status: DC | PRN
  Administered 2017-11-21: 4 mg via INTRAVENOUS

## 2017-11-21 MED ORDER — PROMETHAZINE HCL 25 MG/ML IJ SOLN
6.2500 mg | INTRAMUSCULAR | Status: AC | PRN
  Administered 2017-11-21 (×2): 12.5 mg via INTRAVENOUS

## 2017-11-21 MED ORDER — CELECOXIB 200 MG PO CAPS
200.0000 mg | ORAL_CAPSULE | ORAL | Status: AC
  Administered 2017-11-21: 200 mg via ORAL

## 2017-11-21 MED ORDER — CHLORHEXIDINE GLUCONATE CLOTH 2 % EX PADS: 6.0000 | MEDICATED_PAD | Freq: Once | CUTANEOUS | Status: DC

## 2017-11-21 MED ORDER — HYDROMORPHONE HCL 1 MG/ML IJ SOLN
INTRAMUSCULAR | Status: AC
  Filled 2017-11-21: qty 1

## 2017-11-21 MED ORDER — GABAPENTIN 400 MG PO CAPS
400.0000 mg | ORAL_CAPSULE | Freq: Three times a day (TID) | ORAL | Status: DC
  Administered 2017-11-21 – 2017-11-22 (×4): 400 mg via ORAL
  Filled 2017-11-21 (×4): qty 1

## 2017-11-21 MED ORDER — BUPIVACAINE LIPOSOME 1.3 % IJ SUSP
INTRAMUSCULAR | Status: AC
  Filled 2017-11-21: qty 20

## 2017-11-21 MED ORDER — EVICEL 2 ML EX KIT
PACK | CUTANEOUS | Status: AC
  Filled 2017-11-21: qty 1

## 2017-11-21 MED ORDER — ACETAMINOPHEN 500 MG PO TABS
ORAL_TABLET | ORAL | Status: AC
  Administered 2017-11-21: 1000 mg via ORAL
  Filled 2017-11-21: qty 2

## 2017-11-21 MED ORDER — GABAPENTIN 300 MG PO CAPS
ORAL_CAPSULE | ORAL | Status: AC
  Administered 2017-11-21: 300 mg via ORAL
  Filled 2017-11-21: qty 1

## 2017-11-21 MED ORDER — HYDROMORPHONE HCL 1 MG/ML IJ SOLN
1.0000 mg | INTRAMUSCULAR | Status: DC | PRN
  Administered 2017-11-22: 1 mg via INTRAVENOUS
  Filled 2017-11-21: qty 1

## 2017-11-21 MED ORDER — SODIUM CHLORIDE 0.9 % IV SOLN
INTRAVENOUS | Status: DC | PRN
  Administered 2017-11-21: 70 mL

## 2017-11-21 MED ORDER — PANTOPRAZOLE SODIUM 40 MG PO TBEC
40.0000 mg | DELAYED_RELEASE_TABLET | Freq: Two times a day (BID) | ORAL | Status: DC
  Administered 2017-11-21 – 2017-11-24 (×6): 40 mg via ORAL
  Filled 2017-11-21 (×6): qty 1

## 2017-11-21 MED ORDER — GABAPENTIN 800 MG PO TABS
400.0000 mg | ORAL_TABLET | Freq: Three times a day (TID) | ORAL | Status: DC
  Filled 2017-11-21 (×2): qty 0.5

## 2017-11-21 MED ORDER — SODIUM CHLORIDE 0.9 % IV SOLN
INTRAVENOUS | Status: DC | PRN
  Administered 2017-11-21: 200 mL

## 2017-11-21 MED ORDER — SEVOFLURANE IN SOLN
RESPIRATORY_TRACT | Status: AC
  Filled 2017-11-21: qty 250

## 2017-11-21 MED ORDER — ACETAMINOPHEN 500 MG PO TABS
1000.0000 mg | ORAL_TABLET | ORAL | Status: AC
  Administered 2017-11-21: 1000 mg via ORAL

## 2017-11-21 MED ORDER — EVICEL 5 ML EX KIT
PACK | CUTANEOUS | Status: AC
  Filled 2017-11-21: qty 1

## 2017-11-21 MED ORDER — CYCLOBENZAPRINE HCL 5 MG PO TABS
7.5000 mg | ORAL_TABLET | Freq: Three times a day (TID) | ORAL | Status: DC
  Administered 2017-11-21 – 2017-11-24 (×8): 7.5 mg via ORAL
  Filled 2017-11-21 (×10): qty 1.5

## 2017-11-21 MED ORDER — FENTANYL CITRATE (PF) 100 MCG/2ML IJ SOLN
INTRAMUSCULAR | Status: AC
  Administered 2017-11-21: 50 ug via INTRAVENOUS
  Filled 2017-11-21: qty 2

## 2017-11-21 MED ORDER — SODIUM CHLORIDE FLUSH 0.9 % IV SOLN
INTRAVENOUS | Status: AC
  Filled 2017-11-21: qty 50

## 2017-11-21 MED ORDER — CEFAZOLIN SODIUM-DEXTROSE 2-4 GM/100ML-% IV SOLN
2.0000 g | Freq: Three times a day (TID) | INTRAVENOUS | Status: AC
  Administered 2017-11-21 – 2017-11-22 (×2): 2 g via INTRAVENOUS
  Filled 2017-11-21 (×2): qty 100

## 2017-11-21 MED ORDER — CELECOXIB 200 MG PO CAPS
ORAL_CAPSULE | ORAL | Status: AC
  Administered 2017-11-21: 200 mg via ORAL
  Filled 2017-11-21: qty 1

## 2017-11-21 MED ORDER — GABAPENTIN 300 MG PO CAPS
300.0000 mg | ORAL_CAPSULE | ORAL | Status: AC
  Administered 2017-11-21: 300 mg via ORAL

## 2017-11-21 MED ORDER — ALPRAZOLAM 0.5 MG PO TABS
0.5000 mg | ORAL_TABLET | Freq: Two times a day (BID) | ORAL | Status: DC | PRN
  Administered 2017-11-22 – 2017-11-23 (×3): 0.5 mg via ORAL
  Filled 2017-11-21 (×4): qty 1

## 2017-11-21 MED ORDER — LACTATED RINGERS IV SOLN
INTRAVENOUS | Status: DC | PRN
  Administered 2017-11-21 (×3): via INTRAVENOUS

## 2017-11-21 MED ORDER — ACETAMINOPHEN 500 MG PO TABS
1000.0000 mg | ORAL_TABLET | Freq: Four times a day (QID) | ORAL | Status: DC
  Administered 2017-11-21 – 2017-11-24 (×10): 1000 mg via ORAL
  Filled 2017-11-21 (×10): qty 2

## 2017-11-21 MED ORDER — PROMETHAZINE HCL 25 MG/ML IJ SOLN
INTRAMUSCULAR | Status: AC
  Administered 2017-11-21: 12.5 mg via INTRAVENOUS
  Filled 2017-11-21: qty 1

## 2017-11-21 MED ORDER — FAMOTIDINE 20 MG PO TABS: 20.0000 mg | ORAL_TABLET | Freq: Once | ORAL | Status: DC

## 2017-11-21 MED ORDER — HEPARIN SODIUM (PORCINE) 5000 UNIT/ML IJ SOLN
INTRAMUSCULAR | Status: AC
  Administered 2017-11-21: 5000 [IU] via SUBCUTANEOUS
  Filled 2017-11-21: qty 1

## 2017-11-21 MED ORDER — HYDROMORPHONE HCL 1 MG/ML IJ SOLN
INTRAMUSCULAR | Status: DC | PRN
  Administered 2017-11-21 (×2): 0.5 mg via INTRAVENOUS

## 2017-11-21 SURGICAL SUPPLY — 52 items
APPLIER CLIP 11 MED OPEN (CLIP)
APPLIER CLIP 13 LRG OPEN (CLIP)
BLADE CLIPPER SURG (BLADE) ×3 IMPLANT
BULB RESERV EVAC DRAIN JP 100C (MISCELLANEOUS) ×6 IMPLANT
CANISTER SUCT 3000ML PPV (MISCELLANEOUS) ×3 IMPLANT
CHLORAPREP W/TINT 26ML (MISCELLANEOUS) ×3 IMPLANT
CLIP APPLIE 11 MED OPEN (CLIP) IMPLANT
CLIP APPLIE 13 LRG OPEN (CLIP) IMPLANT
DRAIN CHANNEL JP 19F (MISCELLANEOUS) ×6 IMPLANT
DRAPE INCISE IOBAN 66X45 STRL (DRAPES) ×3 IMPLANT
DRAPE LAPAROTOMY 100X77 ABD (DRAPES) ×3 IMPLANT
DRSG TELFA 3X8 NADH (GAUZE/BANDAGES/DRESSINGS) IMPLANT
ELECT BLADE 6.5 EXT (BLADE) ×3 IMPLANT
ELECT CAUTERY BLADE TIP 2.5 (TIP) ×3
ELECT REM PT RETURN 9FT ADLT (ELECTROSURGICAL) ×3
ELECTRODE CAUTERY BLDE TIP 2.5 (TIP) ×1 IMPLANT
ELECTRODE REM PT RTRN 9FT ADLT (ELECTROSURGICAL) ×1 IMPLANT
GAUZE SPONGE 4X4 12PLY STRL (GAUZE/BANDAGES/DRESSINGS) ×3 IMPLANT
GLOVE BIO SURGEON STRL SZ7 (GLOVE) ×6 IMPLANT
GOWN STRL REUS W/ TWL LRG LVL3 (GOWN DISPOSABLE) ×2 IMPLANT
GOWN STRL REUS W/TWL LRG LVL3 (GOWN DISPOSABLE) ×4
LABEL OR SOLS (LABEL) ×3 IMPLANT
MESH BIO-A  8X8 SYN MATR (Mesh General) ×2 IMPLANT
MESH BIO-A 8X8 SYN MATR (Mesh General) ×1 IMPLANT
MESH SOFT 12X12IN BARD (Mesh General) ×3 IMPLANT
NEEDLE HYPO 22GX1.5 SAFETY (NEEDLE) ×3 IMPLANT
NEEDLE HYPO 25X1 1.5 SAFETY (NEEDLE) ×3 IMPLANT
PACK BASIN MAJOR ARMC (MISCELLANEOUS) ×3 IMPLANT
PACK BASIN MINOR ARMC (MISCELLANEOUS) IMPLANT
SEPRAFILM MEMBRANE 5X6 (MISCELLANEOUS) ×3 IMPLANT
SPONGE DRAIN TRACH 4X4 STRL 2S (GAUZE/BANDAGES/DRESSINGS) ×6 IMPLANT
SPONGE KITTNER 5P (MISCELLANEOUS) ×9 IMPLANT
SPONGE LAP 18X18 5 PK (GAUZE/BANDAGES/DRESSINGS) ×12 IMPLANT
SPONGE LAP 18X18 RF (DISPOSABLE) ×3 IMPLANT
SPONGE LAP 18X36 RFD (DISPOSABLE) ×3 IMPLANT
STAPLER SKIN PROX 35W (STAPLE) IMPLANT
SUT ETHIBOND 0 MO6 C/R (SUTURE) IMPLANT
SUT ETHIBOND NAB MO 7 #0 18IN (SUTURE) IMPLANT
SUT MNCRL 4-0 (SUTURE) ×4
SUT MNCRL 4-0 27XMFL (SUTURE) ×2
SUT PDS AB 0 CT1 27 (SUTURE) ×15 IMPLANT
SUT VIC AB 0 CT1 27 (SUTURE) ×2
SUT VIC AB 0 CT1 27XCR 8 STRN (SUTURE) ×1 IMPLANT
SUT VIC AB 2-0 CT1 18 (SUTURE) ×6 IMPLANT
SUT VIC AB 2-0 SH 27 (SUTURE)
SUT VIC AB 2-0 SH 27XBRD (SUTURE) IMPLANT
SUTURE MNCRL 4-0 27XMF (SUTURE) ×2 IMPLANT
SYR 20CC LL (SYRINGE) ×3 IMPLANT
SYR 30ML LL (SYRINGE) ×6 IMPLANT
SYR BULB IRRIG 60ML STRL (SYRINGE) ×3 IMPLANT
TAPE MICROFOAM 4IN (TAPE) IMPLANT
WATER STERILE IRR 1000ML POUR (IV SOLUTION) IMPLANT

## 2017-11-21 NOTE — Anesthesia Procedure Notes (Signed)
Procedure Name: Intubation Date/Time: 11/21/2017 11:10 AM Performed by: Lesle Reek, CRNA Pre-anesthesia Checklist: Timeout performed, Patient being monitored, Suction available, Emergency Drugs available and Patient identified Patient Re-evaluated:Patient Re-evaluated prior to induction Oxygen Delivery Method: Circle system utilized Preoxygenation: Pre-oxygenation with 100% oxygen Induction Type: IV induction Laryngoscope Size: Mac and 3 Grade View: Grade II Tube type: Oral Tube size: 7.0 mm Number of attempts: 1 Airway Equipment and Method: Stylet Placement Confirmation: ETT inserted through vocal cords under direct vision,  breath sounds checked- equal and bilateral,  CO2 detector and positive ETCO2 Secured at: 21 cm Tube secured with: Tape

## 2017-11-21 NOTE — Anesthesia Postprocedure Evaluation (Signed)
Anesthesia Post Note  Patient: Lindsey Wolf  Procedure(s) Performed: REPAIR ABDOMINAL WALL, ABDOMINAL WALL RECONSTRUCTION with mesh (N/A )  Patient location during evaluation: PACU Anesthesia Type: General Level of consciousness: awake and alert Pain management: pain level controlled Vital Signs Assessment: post-procedure vital signs reviewed and stable Respiratory status: spontaneous breathing, nonlabored ventilation, respiratory function stable and patient connected to nasal cannula oxygen Cardiovascular status: blood pressure returned to baseline and stable Postop Assessment: no apparent nausea or vomiting Anesthetic complications: no     Last Vitals:  Vitals:   11/21/17 1832 11/21/17 1929  BP: 132/86 128/87  Pulse: 73 83  Resp: 14 18  Temp: 36.4 C 36.8 C  SpO2: 98% 100%    Last Pain:  Vitals:   11/21/17 2008  TempSrc:   PainSc: [place]

## 2017-11-21 NOTE — Anesthesia Post-op Follow-up Note (Signed)
Anesthesia QCDR form completed.        

## 2017-11-21 NOTE — Anesthesia Preprocedure Evaluation (Addendum)
Anesthesia Evaluation  Patient identified by MRN, date of birth, ID band  Reviewed: Allergy & Precautions, NPO status , Patient's Chart, lab work & pertinent test results  History of Anesthesia Complications Negative for: history of anesthetic complications  Airway Mallampati: I       Dental  (+) Teeth Intact, Caps, Dental Advidsory Given, Chipped   Pulmonary neg shortness of breath, COPD, neg recent URI, former smoker,           Cardiovascular Exercise Tolerance: Good negative cardio ROS       Neuro/Psych PSYCHIATRIC DISORDERS Anxiety Depression    GI/Hepatic Neg liver ROS, PUD, GERD  Medicated,  Endo/Other  negative endocrine ROS  Renal/GU negative Renal ROS     Musculoskeletal   Abdominal Normal abdominal exam  (+)   Peds negative pediatric ROS (+)  Hematology negative hematology ROS (+)   Anesthesia Other Findings Past Medical History: No date: Abnormal respirations No date: Abscess of abdominal cavity (HCC) No date: Anxiety No date: Depression No date: Empyema of left pleural space (HCC) No date: GERD (gastroesophageal reflux disease) No date: Hypokalemia No date: Perforated ulcer (HCC) No date: Pleural effusion 08/17/2016: Protein-calorie malnutrition, severe No date: Respiratory distress   Reproductive/Obstetrics negative OB ROS                            Anesthesia Physical  Anesthesia Plan  ASA: II  Anesthesia Plan: General   Post-op Pain Management:    Induction: Intravenous  PONV Risk Score and Plan: 3  Airway Management Planned: Oral ETT  Additional Equipment:   Intra-op Plan:   Post-operative Plan: Extubation in OR  Informed Consent: I have reviewed the patients History and Physical, chart, labs and discussed the procedure including the risks, benefits and alternatives for the proposed anesthesia with the patient or authorized representative who has  indicated his/her understanding and acceptance.     Plan Discussed with: Surgeon  Anesthesia Plan Comments:        Anesthesia Quick Evaluation

## 2017-11-21 NOTE — Op Note (Addendum)
PROCEDURES: 1. Extensive Lysis of adhesions taking at least 90 minutes of total operative time 2. Abdominal wall reconstruction ( hernia Repair) using 30x30 cm medium weight macroporus polypropylene mesh using component separation technique bilaterally with transversus abdominal release 3. Placement of 8x8 BIoA bioprosthetic mesh to cover peritoneal defect 3. Abdominoplasty   Pre-operative Diagnosis: Large ventral hernia with loss of Domain from abdominal catastrophe  Post-operative Diagnosis: Same  Surgeon: Jules Husbands   Assistants: Dr. Genevive Bi (required due to the extensive fact complexity of the surgery.  For exposure and adequate assistance during a case that took approximately 5 hours of total operative time)  Anesthesia: General endotracheal anesthesia  ASA Class: 2   Surgeon: Caroleen Hamman , MD FACS  Anesthesia: Gen. with endotracheal tube   Findings: Large ventral hernia measuring approximately 13 cm with loss of domain. Very dense adhesions from the abdominal wall to the liver from the small bowel to the abdominal wall. Tension-free repair with restoration of the abdominal domain.  Estimated Blood Loss: 150cc         Drains: 7 Blake drains x2 upper within the subcutaneous tissue and lower within the retrorectus space         Specimens: Skin subtenons tissue       Complications: None               Condition: Stable  Procedure Details  The patient was seen again in the Holding Room. The benefits, complications, treatment options, and expected outcomes were discussed with the patient. The risks of bleeding, infection, recurrence of symptoms, failure to resolve symptoms,  bowel injury, any of which could require further surgery were reviewed with the patient.   The patient was taken to Operating Room, identified as Lindsey Wolf and the procedure verified.  A Time Out was held and the above information confirmed.  Prior to the induction of general anesthesia,  antibiotic prophylaxis was administered. VTE prophylaxis was in place. General endotracheal anesthesia was then administered and tolerated well. After the induction, the abdomen was prepped with Chloraprep and draped in the sterile fashion. The patient was positioned in the supine position.   Enters midline laparotomy was performed using a 10 blade knife.  We incorporated the pain incision from the ventral hernia to create abdominoplasty.  We initiated our incision of the fascia in the lower portion away from the hernia and entered the abdominal cavity under direct visualization with Metzenbaum scissors.  We extended our fascial incision and perform extensive lysis of adhesions taking at least 90 minutes of total operative time.  There were dense adhesions from the liver to the abdominal wall from the small bowel to the abdominal wall and from the abdominal wall.  These were lysed in a combination with Metzenbaums and electrocautery.  The hernia sac was excised and reducing the standard fashion.  Once we had an adequate exposure and lysis of adhesions we started our incision of the posterior rectus sheath on the right side attention then was turned to the rectus muscles and a retrorectal plane was developed.  We proceeded to perform our relaxing incision posterior to the lamella of the internal oblique.  Performed the relaxing incision from our caudal to cranial fashion.  Using Kitners retroperitoneal space was dissected in the standard fashion.  Please note that we use electrocautery for hemostasis and we prefer to preserve the perforating vessels to the abdominal wall. Exact same technique was made in the contralateral side.  We had a great space  within the retroperitoneal space so we can reconstruct the abdominal wall.  Please note that there was 6 x 6 cm peritoneal rent that we [place] with an 8 x 8 bio a mesh.  Before performing this we place an anti-adhesive Seprafilm to prevent any potential adhesions to  the biological mesh.  Posterior sheath was closed with multiple running 0 PDS in the standard fashion.   Once we had adequate exposure mobilization of both flaps bilaterally a 30 x 30 cm macroporus mesh was selected. Attached the mesh in 4 quadrants to the symphysis pubis and well to the xiphoid process as well as to the abdominal wall laterally.  Using AdvaSeal we coated the mesh so I can stick to the rest of the abdominal wall in the standard fashion. 2 Blake drain was placed on top of this mesh and the anterior rectus sheath was closed with multiple interrupted 0 PDS suture.  She was then turned to the cutaneous tissue where random cutaneous flaps were created in order to obtain an acceptable result for the closure and to have a tension-free closure.  This was performed using electrocautery.  This also aided [place] in perform the abdominoplasty to re-create a flat surface to the abdominal wall.  We perform this after excising excess skin and subcutaneous tissue and creating random cutaneous flaps in the subcutaneous tissue.  The subcu was closed with interrupted 0 Vicryl sutures and the skin was closed with a 4-0 Monocryl in a subcuticular fashion.  The incision was coated with Dermabond.    A 19 Blake drain was placed in the subcutaneous tissue as well.  The drain were sutured in place with a 2-0 silk. Liposomal Marcaine was injected on all incision sites under direct visualization. Needle and laparotomy count were correct and there were no immediate complications.  Caroleen Hamman, MD, FACS

## 2017-11-21 NOTE — Transfer of Care (Signed)
Immediate Anesthesia Transfer of Care Note  Patient: Lindsey Wolf  Procedure(s) Performed: REPAIR ABDOMINAL WALL, ABDOMINAL WALL RECONSTRUCTION with mesh (N/A )  Patient Location: PACU  Anesthesia Type:General  Level of Consciousness: awake and alert   Airway & Oxygen Therapy: Patient Spontanous Breathing and Patient connected to face mask oxygen  Post-op Assessment: Report given to RN and Post -op Vital signs reviewed and stable  Post vital signs: Reviewed and stable  Last Vitals:  Vitals Value Taken Time  BP 131/69 11/21/2017  4:27 PM  Temp    Pulse 82 11/21/2017  4:27 PM  Resp 18 11/21/2017  4:27 PM  SpO2 100 % 11/21/2017  4:27 PM    Last Pain:  Vitals:   11/21/17 0950  TempSrc: Tympanic         Complications: No apparent anesthesia complications

## 2017-11-21 NOTE — H&P (Signed)
HPI Lindsey Wolf is a 52 y.o. female following up after CT scan and labs performed. Labs are normal including a normal coags and albumin.  The scan showing a large ventral hernia with loss of domain.  No more acute interabdominal pathology.  She is well-known to [place] for a previous abdominal catastrophe her initial culprit was a perforated ulcer that required [facility] patch and subsequently another operation for persistent leak. Patient also developed a left empyema requiring a thoracotomy and decortication. 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 She now reports her last few months has available which to the right side of her abdominal wall. There is some discomfort no evidence of obstruction some intermittent abdominal pain. Her overall status and nutritional status has improved dramatically. She is able to perform more than 4 METS of activity without any shortness of breath and she has gained her weight back.    HPI      Past Medical History:  Diagnosis Date  . Abnormal respirations   . Abscess of abdominal cavity ([place])   . Anxiety   . Depression   . Empyema of left pleural space ([place])   . GERD (gastroesophageal reflux disease)   . Hypokalemia   . Perforated ulcer ([place])   . Pleural effusion   . Protein-calorie malnutrition, severe 08/17/2016  . Respiratory distress          Past Surgical History:  Procedure Laterality Date  . APPLICATION OF WOUND VAC N/A 08/12/2016   Procedure: ,remove 2  JP drains,add one pennrose;  Surgeon: Florene Glen, MD;  Location: [facility] ORS;  Service: General;  Laterality: N/A;  . CHEST TUBE INSERTION Right 08/08/2016   Procedure: CHEST TUBE INSERTION;  Surgeon: [name], MD;  Location: [facility] ORS;  Service: General;  Laterality: Right;  . DEBRIDEMENT OF ABDOMINAL WALL ABSCESS N/A 08/08/2016   Procedure: DEBRIDEMENT OF ABDOMINAL WALL  ABSCESS;  Surgeon: Florene Glen, MD;  Location: [facility] ORS;  Service: General;  Laterality: N/A;  . ESOPHAGOGASTRODUODENOSCOPY (EGD) WITH PROPOFOL N/A 02/19/2017   Procedure: ESOPHAGOGASTRODUODENOSCOPY (EGD) WITH PROPOFOL;  Surgeon: Jonathon Bellows, MD;  Location: [facility] ENDOSCOPY;  Service: Gastroenterology;  Laterality: N/A;  . LAPAROTOMY N/A 07/20/2016   Procedure: EXPLORATORY LAPAROTOMY;  Surgeon: Jules Husbands, MD;  Location: [facility] ORS;  Service: General;  Laterality: N/A;  . LAPAROTOMY N/A 07/30/2016   Procedure: EXPLORATORY LAPAROTOMY drainage peritoneal abscess;  Surgeon: Clayburn Pert, MD;  Location: [facility] ORS;  Service: General;  Laterality: N/A;  . LYSIS OF ADHESION  07/30/2016   Procedure: LYSIS OF ADHESION;  Surgeon: Clayburn Pert, MD;  Location: [facility] ORS;  Service: General;;  . VIDEO ASSISTED THORACOSCOPY (VATS)/THOROCOTOMY Left 08/08/2016   Procedure: VIDEO ASSISTED THORACOSCOPY (VATS)/THOROCOTOMY Possible Thoracotomy;  Surgeon: [name], MD;  Location: [facility] ORS;  Service: General;  Laterality: Left;         Family History  Problem Relation Age of Onset  . Cancer Mother        Lung  . COPD Mother   . Emphysema Mother   . Neuropathy Mother   . Anxiety disorder Mother   . Depression Mother   . Heart disease Father 14       Heart Attack  . Cancer Maternal Grandmother        Doesn't remember  . Emphysema Maternal Aunt   . Cancer Other        Doesn't remember    Social  History Social History   Tobacco Use  . Smoking status: Current Every Day Smoker    Packs/day: 1.00    Years: 11.00    Pack years: 11.00    Types: Cigarettes  . Smokeless tobacco: Never Used  Substance Use Topics  . Alcohol use: No  . Drug use: No    No Known Allergies        Current Outpatient Medications  Medication Sig Dispense Refill  . ALPRAZolam (XANAX) 0.5 MG tablet Take by mouth.    . iron polysaccharides (NU-IRON) 150 MG capsule Take by mouth.     . Olopatadine HCl 0.2 % SOLN Place 0.2 Units into both eyes daily.  0  . pantoprazole (PROTONIX) 40 MG tablet TAKE 1 TABLET BY MOUTH TWICE DAILY 60 tablet 1  . venlafaxine XR (EFFEXOR-XR) 75 MG 24 hr capsule Take 75 mg by mouth once.     No current facility-administered medications for this visit.      Review of Systems Full ROS  was asked and was negative except for the information on the HPI  Physical Exam CONSTITUTIONAL: NAD EYES: Pupils are equal, round, and reactive to light, Sclera are non-icteric. EARS, NOSE, MOUTH AND THROAT: The oropharynx is clear. The oral mucosa is pink and moist. Hearing is intact to voice. LYMPH NODES:  Lymph nodes in the neck are normal. RESPIRATORY:  Lungs are clear. There is normal respiratory effort, with equal breath sounds bilaterally, and without pathologic use of accessory muscles. CARDIOVASCULAR: Heart is regular without murmurs, gallops, or rubs. GI: The abdomen is  soft, large incisional hernia with loss of domain.  There is also significant scarring and deformity to the abdominal wall.  Evidence of incarceration or peritonitis.   No ulcerations or open wounds GU: Rectal deferred.   MUSCULOSKELETAL: Normal muscle strength and tone. No cyanosis or edema.   SKIN: Turgor is good and there are no pathologic skin lesions or ulcers. NEUROLOGIC: Motor and sensation is grossly normal. Cranial nerves are grossly intact. PSYCH:  Oriented to person, place and time. Affect is normal.  Data Reviewed  I have personally reviewed the patient's imaging, laboratory findings and medical records.    Assessment  /Plan 52 year old female with symptomatic large incisional hernia with loss of domain. Will definitely benefit from a component separation technique. Discussed with the patient in detail about the procedure.  She understands this going to be a lengthy surgery with possible complications that are frequent including seroma formation significant  and prolonged hospital stay with possible chronic pain.  Other risks including bleeding, infection recurrence and injury to adjacent structures were discussed with the patient in detail. Early as far as her medical status and performance she is as good as she is going to get on she is well optimized from a physiological perspective. Have discussed with the patient detail about the surgery this will be quite complex and in need for a second surgeon.  We will ask Dr. Genevive Bi to be present during this reconstruction. BECAUSE of the complexity of this repair this is all these open repair and not amenable for minimally invasive technique. EXTensive counseling provided  Caroleen Hamman, MD [facility] General Surgeon

## 2017-11-22 ENCOUNTER — Encounter: Payer: Self-pay | Admitting: Surgery

## 2017-11-22 LAB — BASIC METABOLIC PANEL
ANION GAP: 7 (ref 5–15)
BUN: 11 mg/dL (ref 6–20)
CO2: 23 mmol/L (ref 22–32)
Calcium: 8.1 mg/dL — ABNORMAL LOW (ref 8.9–10.3)
Chloride: 108 mmol/L (ref 101–111)
Creatinine, Ser: 0.52 mg/dL (ref 0.44–1.00)
GFR calc Af Amer: >60 mL/min (ref >60)
Glucose, Bld: 125 mg/dL — ABNORMAL HIGH (ref 65–99)
Potassium: 3.7 mmol/L (ref 3.5–5.1)
Sodium: 138 mmol/L (ref 135–145)

## 2017-11-22 LAB — CBC
HEMATOCRIT: 37.1 % (ref 35.0–47.0)
Hemoglobin: 12.5 g/dL (ref 12.0–16.0)
MCH: 31.3 pg (ref 26.0–34.0)
MCHC: 33.7 g/dL (ref 32.0–36.0)
MCV: 92.8 fL (ref 80.0–100.0)
PLATELETS: 225 10*3/uL (ref 150–440)
RBC: 4 MIL/uL (ref 3.80–5.20)
RDW: 14.1 % (ref 11.5–14.5)
WBC: 9.3 10*3/uL (ref 3.6–11.0)

## 2017-11-22 LAB — HIV ANTIBODY (ROUTINE TESTING W REFLEX): HIV Screen 4th Generation wRfx: NONREACTIVE

## 2017-11-22 MED ORDER — MORPHINE SULFATE (PF) 4 MG/ML IV SOLN
4.0000 mg | INTRAVENOUS | Status: DC | PRN
  Administered 2017-11-22 – 2017-11-23 (×3): 4 mg via INTRAVENOUS
  Filled 2017-11-22 (×3): qty 1

## 2017-11-22 MED ORDER — OXYCODONE HCL 5 MG PO TABS
20.0000 mg | ORAL_TABLET | ORAL | Status: DC | PRN
  Administered 2017-11-22 – 2017-11-24 (×9): 20 mg via ORAL
  Filled 2017-11-22 (×9): qty 4

## 2017-11-22 MED ORDER — VENLAFAXINE HCL ER 75 MG PO CP24
75.0000 mg | ORAL_CAPSULE | Freq: Every morning | ORAL | Status: DC
  Administered 2017-11-22 – 2017-11-24 (×3): 75 mg via ORAL
  Filled 2017-11-22 (×3): qty 1

## 2017-11-22 NOTE — Progress Notes (Signed)
POD # 1 abd wall reconst + flatus + pain AVSS Labs ok Good UO  PE NAD Abd: incision c/d/i, no infection, JP serosanguinous. No peritonitis  A/P Doing well Increase oxy dose Ambulate Ice packs DC foley Regular diet

## 2017-11-23 MED ORDER — POLYETHYLENE GLYCOL 3350 17 G PO PACK
17.0000 g | PACK | Freq: Every day | ORAL | Status: DC
  Administered 2017-11-23 – 2017-11-24 (×2): 17 g via ORAL
  Filled 2017-11-23 (×2): qty 1

## 2017-11-23 MED ORDER — GABAPENTIN 400 MG PO CAPS
400.0000 mg | ORAL_CAPSULE | Freq: Four times a day (QID) | ORAL | Status: DC
  Administered 2017-11-23 – 2017-11-24 (×5): 400 mg via ORAL
  Filled 2017-11-23 (×5): qty 1

## 2017-11-23 MED ORDER — ALUM & MAG HYDROXIDE-SIMETH 200-200-20 MG/5ML PO SUSP
30.0000 mL | Freq: Four times a day (QID) | ORAL | Status: DC | PRN
Start: 2017-11-23 — End: 2017-11-24
  Administered 2017-11-23: 30 mL via ORAL
  Filled 2017-11-23: qty 30

## 2017-11-23 MED ORDER — DOCUSATE SODIUM 100 MG PO CAPS
200.0000 mg | ORAL_CAPSULE | Freq: Two times a day (BID) | ORAL | Status: DC
  Administered 2017-11-23 – 2017-11-24 (×3): 200 mg via ORAL
  Filled 2017-11-23 (×3): qty 2

## 2017-11-23 NOTE — Progress Notes (Signed)
POD # 2  AVSS Drain 305cc serous Still using parenteral narcotics frequently + PO  PE NAD Abd: incision c/d/i, serous fluid from JPs. No peritonitis or infection  A/P Doing well Increase gabapentin dose DC in am if pain is better controlled

## 2017-11-24 LAB — CBC
HEMATOCRIT: 31.9 % — AB (ref 35.0–47.0)
Hemoglobin: 11 g/dL — ABNORMAL LOW (ref 12.0–16.0)
MCH: 31.7 pg (ref 26.0–34.0)
MCHC: 34.3 g/dL (ref 32.0–36.0)
MCV: 92.5 fL (ref 80.0–100.0)
PLATELETS: 228 10*3/uL (ref 150–440)
RBC: 3.45 MIL/uL — ABNORMAL LOW (ref 3.80–5.20)
RDW: 13.9 % (ref 11.5–14.5)
WBC: 6.5 10*3/uL (ref 3.6–11.0)

## 2017-11-24 LAB — BASIC METABOLIC PANEL
Anion gap: 8 (ref 5–15)
BUN: 9 mg/dL (ref 6–20)
CALCIUM: 8.1 mg/dL — AB (ref 8.9–10.3)
CO2: 25 mmol/L (ref 22–32)
Chloride: 105 mmol/L (ref 101–111)
Creatinine, Ser: 0.56 mg/dL (ref 0.44–1.00)
GFR calc Af Amer: >60 mL/min (ref >60)
GLUCOSE: 103 mg/dL — AB (ref 65–99)
Potassium: 3.7 mmol/L (ref 3.5–5.1)
Sodium: 138 mmol/L (ref 135–145)

## 2017-11-24 MED ORDER — GABAPENTIN 600 MG PO TABS: 600.0000 mg | ORAL_TABLET | Freq: Three times a day (TID) | ORAL | 2 refills | Status: DC

## 2017-11-24 MED ORDER — OXYCODONE-ACETAMINOPHEN 10-325 MG PO TABS: 1.0000 | ORAL_TABLET | ORAL | 0 refills | Status: DC | PRN

## 2017-11-24 MED ORDER — CYCLOBENZAPRINE HCL 7.5 MG PO TABS: 7.5000 mg | ORAL_TABLET | Freq: Three times a day (TID) | ORAL | 0 refills | Status: DC

## 2017-11-24 MED ORDER — MAGNESIUM CITRATE PO SOLN
1.0000 | Freq: Once | ORAL | Status: AC
  Administered 2017-11-24: 1 via ORAL
  Filled 2017-11-24: qty 296

## 2017-11-24 NOTE — Discharge Summary (Signed)
Patient ID: Lindsey Wolf MRN: [id number] DOB/AGE: 1965/12/28 52 y.o.  Admit date: 11/21/2017 Discharge date: 11/24/2017   Discharge Diagnoses:  Active Problems:   S/P repair of ventral hernia   Procedures:  Abdominal  wall reconstruction with TAR release  Hospital Course:  52 year old female with a previous history of perforated ulcer and abdominal catastrophe developed large ventral hernia with loss of domain and admitted for an elective abdominal wall reconstruction mesh placement and TAR release. She did very well postoperatively pain as suspected was the main issue requiring significant IV narcotics.  She was able to tolerate regular diet and also was placed on p.o. oxycodone.  The time of discharge she was ambulating, tolerating regular diet.  Passing gas.  Pain was controlled.  JP with serous output but greater than 100 from each drain that is not unexpected.  Physical exam showed a female in no acute distress.  Awake and alert.  Abdomen: Incision healing well without evidence of infection JPs in place serous drainage.  No evidence of recurrence.  Extremity well using no edema.  Condition of the patient at the time of discharge was stable   Disposition: Discharge disposition: 01-Home or Self Care       Discharge Instructions    Call MD for:  difficulty breathing, headache or visual disturbances   Complete by:  As directed    Call MD for:  extreme fatigue   Complete by:  As directed    Call MD for:  hives   Complete by:  As directed    Call MD for:  persistant dizziness or light-headedness   Complete by:  As directed    Call MD for:  persistant nausea and vomiting   Complete by:  As directed    Call MD for:  redness, tenderness, or signs of infection (pain, swelling, redness, odor or green/yellow discharge around incision site)   Complete by:  As directed    Call MD for:  severe uncontrolled pain   Complete by:  As directed    Call MD for:  temperature >100.4    Complete by:  As directed    Diet - low sodium heart healthy   Complete by:  As directed    Discharge instructions   Complete by:  As directed    Teach pt about JP care, may shower daily   Increase activity slowly   Complete by:  As directed      Allergies as of 11/24/2017   No Known Allergies     Medication List    STOP taking these medications   acetaminophen 650 MG CR tablet Commonly known as:  TYLENOL     TAKE these medications   albuterol 108 (90 Base) MCG/ACT inhaler Commonly known as:  PROVENTIL HFA;VENTOLIN HFA Inhale 2 puffs into the lungs every 6 (six) hours as needed for wheezing or shortness of breath.   ALPRAZolam 0.5 MG tablet Commonly known as:  XANAX Take 0.5 mg by mouth 2 (two) times daily as needed for anxiety.   Biotin 10000 MCG Tabs Take 10,000 mcg by mouth daily.   CHANTIX 1 MG tablet Generic drug:  varenicline Take 1 mg by mouth 2 (two) times daily.   cyclobenzaprine 7.5 MG tablet Commonly known as:  FEXMID Take 1 tablet (7.5 mg total) by mouth 3 (three) times daily.   gabapentin 600 MG tablet Commonly known as:  NEURONTIN Take 1 tablet (600 mg total) by mouth 3 (three) times daily.   loratadine 10 MG  tablet Commonly known as:  CLARITIN Take 10 mg by mouth daily as needed for allergies.   NASACORT ALLERGY 24HR NA Place 2 sprays into the nose daily.   oxyCODONE-acetaminophen 10-325 MG tablet Commonly known as:  PERCOCET Take 1 tablet by mouth every 4 (four) hours as needed for pain (May take 2 tab for severe pain).   pantoprazole 40 MG tablet Commonly known as:  PROTONIX TAKE 1 TABLET BY MOUTH TWICE DAILY What changed:    how much to take  how to take this  when to take this   traZODone 100 MG tablet Commonly known as:  DESYREL Take 100 mg by mouth at bedtime as needed for sleep.   venlafaxine XR 75 MG 24 hr capsule Commonly known as:  EFFEXOR-XR Take 75 mg by mouth daily.   vitamin B-12 500 MCG tablet Commonly known as:   CYANOCOBALAMIN Take 500 mcg by mouth daily.      Follow-up Information    Grasiela Jonsson, [place] F, MD. Schedule an appointment as soon as possible for a visit on 11/29/2017.   Specialty:  General Surgery Contact information: [address] [place] [address] [phone]            Caroleen Hamman, MD FACS

## 2017-11-24 NOTE — Progress Notes (Addendum)
Patient discharge teaching given, including activity, diet, follow-up appoints, and medications. Patient verbalized understanding of all discharge instructions. IV access was d/c'd. Pt demonstrated understanding how to empty JP drains by teach back. RN sent home dressing changing supplies, measuring cups, and drainage output sheet. Vitals are stable. Skin is intact except as charted in most recent assessments. Pt to be escorted out by volunteer, to be driven home by friend.  Veralyn Lopp [employer]

## 2017-11-25 LAB — SURGICAL PATHOLOGY

## 2017-11-26 ENCOUNTER — Other Ambulatory Visit: Payer: Self-pay

## 2017-11-29 ENCOUNTER — Ambulatory Visit (INDEPENDENT_AMBULATORY_CARE_PROVIDER_SITE_OTHER): Payer: BLUE CROSS/BLUE SHIELD | Admitting: Surgery

## 2017-11-29 ENCOUNTER — Encounter: Payer: Self-pay | Admitting: Surgery

## 2017-11-29 VITALS — BP 121/84 | HR 130 | Temp 98.0°F | Ht 70.0 in | Wt 185.0 lb

## 2017-11-29 DIAGNOSIS — Z09 Encounter for follow-up examination after completed treatment for conditions other than malignant neoplasm: Secondary | ICD-10-CM

## 2017-11-29 NOTE — Patient Instructions (Signed)
Please continue to keep a record of the drain output. Please see your follow up appointment listed below.

## 2017-11-29 NOTE — Progress Notes (Signed)
POD # 8 abd wall reconstruction TAR release w Mesh Doing very well Some pain JP INF , 30cc day. Superior one about 60cc/day Taking po, no fevers  PE NAD Abd: soft, midline wound healing well, no infection. Inf JP removed. Other JP w serous fluid. No peritonitis  A/P Doing very well considering her complex surgery RTC next week hopefully we will be able to remove the other JP

## 2017-12-04 ENCOUNTER — Encounter: Payer: Self-pay | Admitting: Surgery

## 2017-12-04 ENCOUNTER — Ambulatory Visit (INDEPENDENT_AMBULATORY_CARE_PROVIDER_SITE_OTHER): Payer: BLUE CROSS/BLUE SHIELD | Admitting: Surgery

## 2017-12-04 VITALS — BP 115/81 | HR 106 | Temp 97.8°F | Ht 70.0 in | Wt 182.0 lb

## 2017-12-04 DIAGNOSIS — Z09 Encounter for follow-up examination after completed treatment for conditions other than malignant neoplasm: Secondary | ICD-10-CM

## 2017-12-04 NOTE — Patient Instructions (Signed)
We removed your drain today. Please keep a dressing over the drain site until it heals completed.  Please see your appointment listed below.

## 2017-12-04 NOTE — Progress Notes (Signed)
S/p abd wall reconstruction 6/20 Some weakness Taking PO, + BM Average 30-40 cc from drain Pains continues to improve  PE NAD Abd: soft, incisions healing well, no infection. JP removed ( serous). No recurrence  A/P Doing well Weakness and lightheadedness may be related to gabapentin and flexeril. D/W her weaning off narcotics and gabapentin RTC next week

## 2017-12-09 ENCOUNTER — Encounter: Payer: Self-pay | Admitting: Surgery

## 2017-12-11 ENCOUNTER — Ambulatory Visit (INDEPENDENT_AMBULATORY_CARE_PROVIDER_SITE_OTHER): Payer: BLUE CROSS/BLUE SHIELD | Admitting: Surgery

## 2017-12-11 ENCOUNTER — Encounter: Payer: Self-pay | Admitting: Surgery

## 2017-12-11 VITALS — BP 118/80 | HR 88 | Temp 98.3°F | Wt 179.0 lb

## 2017-12-11 DIAGNOSIS — Z09 Encounter for follow-up examination after completed treatment for conditions other than malignant neoplasm: Secondary | ICD-10-CM

## 2017-12-11 NOTE — Patient Instructions (Addendum)
Please give us a call in case you have any questions or concerns.  

## 2017-12-11 NOTE — Progress Notes (Signed)
S/p abd wall reconstruction  Doing very well Some mild pain , taking PO no fevers  PE NAD Abd: soft, incision c/d/i, no infection or recurrence  A/P Doing very well RTC 4 weeks for a final check

## 2017-12-17 ENCOUNTER — Encounter: Payer: Self-pay | Admitting: Surgery

## 2017-12-23 ENCOUNTER — Encounter: Payer: Self-pay | Admitting: Surgery

## 2017-12-23 ENCOUNTER — Ambulatory Visit (INDEPENDENT_AMBULATORY_CARE_PROVIDER_SITE_OTHER): Payer: BLUE CROSS/BLUE SHIELD | Admitting: Surgery

## 2017-12-23 VITALS — BP 120/76 | HR 78 | Ht 70.0 in | Wt 178.0 lb

## 2017-12-23 DIAGNOSIS — Z09 Encounter for follow-up examination after completed treatment for conditions other than malignant neoplasm: Secondary | ICD-10-CM

## 2017-12-23 MED ORDER — AMOXICILLIN-POT CLAVULANATE 875-125 MG PO TABS: 1.0000 | ORAL_TABLET | Freq: Two times a day (BID) | ORAL | 0 refills | Status: AC

## 2017-12-23 MED ORDER — AMOXICILLIN-POT CLAVULANATE 875-125 MG PO TABS: 1.0000 | ORAL_TABLET | Freq: Two times a day (BID) | ORAL | 0 refills | Status: DC

## 2017-12-23 NOTE — Patient Instructions (Addendum)
Rx sent to pharmacy . Return as scheduled.

## 2017-12-23 NOTE — Progress Notes (Signed)
S/p abd wall reconstruction 6/20 She had some clear drainage via a new small wound dehiscence No fever or chills Taking PO  PE NAD Abd: soft, NT, no abscess or infection. 1.5 cm opening w good granulation tissue, PDS knot seen and removed. Bandaid applied  A/p Doing well Given that she has a large mesh we will do prophylactic antibiotics No need for surgical intervention or further images RTC 10 days

## 2018-01-01 ENCOUNTER — Encounter: Payer: Self-pay | Admitting: Surgery

## 2018-01-01 ENCOUNTER — Ambulatory Visit (INDEPENDENT_AMBULATORY_CARE_PROVIDER_SITE_OTHER): Payer: BLUE CROSS/BLUE SHIELD | Admitting: Surgery

## 2018-01-01 VITALS — BP 147/92 | HR 87 | Temp 97.8°F | Wt 179.0 lb

## 2018-01-01 DIAGNOSIS — Z09 Encounter for follow-up examination after completed treatment for conditions other than malignant neoplasm: Secondary | ICD-10-CM

## 2018-01-01 NOTE — Patient Instructions (Signed)
Please give [place] a call in case you have any questions or concerns.  Good luck on your interview!

## 2018-01-01 NOTE — Progress Notes (Signed)
S/p abdominal wall reconstruction Doing well Taking PO AVSS  PE NAD Abd: small wound 2 cm good granulation, no infection or recurrence. No cellulitis  A/p Doing well continue wound care No need for furhter a/bs RTC 3-4 wks

## 2018-01-07 ENCOUNTER — Other Ambulatory Visit: Payer: Self-pay | Admitting: Internal Medicine

## 2018-01-07 DIAGNOSIS — Z1231 Encounter for screening mammogram for malignant neoplasm of breast: Secondary | ICD-10-CM

## 2018-01-14 ENCOUNTER — Other Ambulatory Visit: Payer: Self-pay | Admitting: Gastroenterology

## 2018-01-14 ENCOUNTER — Ambulatory Visit
Admission: RE | Admit: 2018-01-14 | Discharge: 2018-01-14 | Disposition: A | Discharge status: Home / Self Care | Payer: BLUE CROSS/BLUE SHIELD | Source: Ambulatory Visit | Attending: Gastroenterology | Admitting: Gastroenterology

## 2018-01-14 DIAGNOSIS — R1013 Epigastric pain: Secondary | ICD-10-CM

## 2018-01-16 ENCOUNTER — Telehealth: Payer: Self-pay | Admitting: Surgery

## 2018-01-16 NOTE — Telephone Encounter (Signed)
Patient would like to speak to nurse. Please advise

## 2018-01-16 NOTE — Telephone Encounter (Signed)
Called patient back and she wanted to know what she needed to do for her to get a copy of her records. I told her that when she comes in next week, she could sign a release of information and Claiborne Billings our front desk personnel will get them ready for her. She understood and had no further questions.

## 2018-01-17 ENCOUNTER — Ambulatory Visit
Admission: RE | Admit: 2018-01-17 | Discharge: 2018-01-17 | Disposition: A | Discharge status: Home / Self Care | Payer: BLUE CROSS/BLUE SHIELD | Source: Ambulatory Visit | Attending: Gastroenterology | Admitting: Gastroenterology

## 2018-01-17 DIAGNOSIS — R1013 Epigastric pain: Secondary | ICD-10-CM | POA: Insufficient documentation

## 2018-01-17 DIAGNOSIS — R1010 Upper abdominal pain, unspecified: Secondary | ICD-10-CM | POA: Diagnosis present

## 2018-01-17 DIAGNOSIS — R935 Abnormal findings on diagnostic imaging of other abdominal regions, including retroperitoneum: Secondary | ICD-10-CM | POA: Diagnosis not present

## 2018-01-17 DIAGNOSIS — Z8719 Personal history of other diseases of the digestive system: Secondary | ICD-10-CM | POA: Diagnosis present

## 2018-01-20 ENCOUNTER — Ambulatory Visit: Payer: BLUE CROSS/BLUE SHIELD | Admitting: Anesthesiology

## 2018-01-20 ENCOUNTER — Ambulatory Visit
Admission: RE | Admit: 2018-01-20 | Discharge: 2018-01-20 | Disposition: A | Discharge status: Home / Self Care | Payer: BLUE CROSS/BLUE SHIELD | Source: Ambulatory Visit | Attending: Gastroenterology | Admitting: Gastroenterology

## 2018-01-20 ENCOUNTER — Encounter
Admission: RE | Disposition: A | Discharge status: Home / Self Care | Payer: Self-pay | Source: Ambulatory Visit | Attending: Gastroenterology

## 2018-01-20 DIAGNOSIS — K224 Dyskinesia of esophagus: Secondary | ICD-10-CM | POA: Insufficient documentation

## 2018-01-20 DIAGNOSIS — K222 Esophageal obstruction: Secondary | ICD-10-CM | POA: Insufficient documentation

## 2018-01-20 DIAGNOSIS — K219 Gastro-esophageal reflux disease without esophagitis: Secondary | ICD-10-CM | POA: Diagnosis not present

## 2018-01-20 DIAGNOSIS — Z7951 Long term (current) use of inhaled steroids: Secondary | ICD-10-CM | POA: Diagnosis not present

## 2018-01-20 DIAGNOSIS — R1013 Epigastric pain: Secondary | ICD-10-CM | POA: Diagnosis present

## 2018-01-20 DIAGNOSIS — F419 Anxiety disorder, unspecified: Secondary | ICD-10-CM | POA: Insufficient documentation

## 2018-01-20 DIAGNOSIS — K2960 Other gastritis without bleeding: Secondary | ICD-10-CM | POA: Diagnosis not present

## 2018-01-20 DIAGNOSIS — Z87891 Personal history of nicotine dependence: Secondary | ICD-10-CM | POA: Diagnosis not present

## 2018-01-20 DIAGNOSIS — K449 Diaphragmatic hernia without obstruction or gangrene: Secondary | ICD-10-CM | POA: Insufficient documentation

## 2018-01-20 DIAGNOSIS — J449 Chronic obstructive pulmonary disease, unspecified: Secondary | ICD-10-CM | POA: Insufficient documentation

## 2018-01-20 DIAGNOSIS — K3189 Other diseases of stomach and duodenum: Secondary | ICD-10-CM | POA: Insufficient documentation

## 2018-01-20 DIAGNOSIS — F329 Major depressive disorder, single episode, unspecified: Secondary | ICD-10-CM | POA: Insufficient documentation

## 2018-01-20 HISTORY — PX: ESOPHAGOGASTRODUODENOSCOPY (EGD) WITH PROPOFOL: SHX5813

## 2018-01-20 SURGERY — ESOPHAGOGASTRODUODENOSCOPY (EGD) WITH PROPOFOL
Anesthesia: General

## 2018-01-20 MED ORDER — SODIUM CHLORIDE 0.9 % IV SOLN
INTRAVENOUS | Status: DC
  Administered 2018-01-20: 1000 mL via INTRAVENOUS

## 2018-01-20 MED ORDER — PROPOFOL 10 MG/ML IV BOLUS
INTRAVENOUS | Status: DC | PRN
  Administered 2018-01-20: 20 mg via INTRAVENOUS
  Administered 2018-01-20: 10 mg via INTRAVENOUS
  Administered 2018-01-20: 70 mg via INTRAVENOUS

## 2018-01-20 MED ORDER — LIDOCAINE HCL (PF) 2 % IJ SOLN
INTRAMUSCULAR | Status: DC | PRN
  Administered 2018-01-20: 100 mg via INTRADERMAL

## 2018-01-20 MED ORDER — PROPOFOL 500 MG/50ML IV EMUL
INTRAVENOUS | Status: DC | PRN
  Administered 2018-01-20: 150 ug/kg/min via INTRAVENOUS

## 2018-01-20 NOTE — Transfer of Care (Signed)
Immediate Anesthesia Transfer of Care Note  Patient: Lindsey Wolf  Procedure(s) Performed: ESOPHAGOGASTRODUODENOSCOPY (EGD) WITH PROPOFOL (N/A )  Patient Location: PACU  Anesthesia Type:General  Level of Consciousness: awake and oriented  Airway & Oxygen Therapy: Patient Spontanous Breathing and Patient connected to nasal cannula oxygen  Post-op Assessment: Report given to RN and Post -op Vital signs reviewed and stable  Post vital signs: Reviewed and stable  Last Vitals:  Vitals Value Taken Time  BP    Temp    Pulse    Resp    SpO2      Last Pain:  Vitals:   01/20/18 1234  TempSrc: Tympanic  PainSc: 4       Patients Stated Pain Goal: 0 (13/08/65 7846)  Complications: No apparent anesthesia complications

## 2018-01-20 NOTE — Anesthesia Preprocedure Evaluation (Addendum)
Anesthesia Evaluation  Patient identified by MRN, date of birth, ID band  Reviewed: Allergy & Precautions, NPO status , Patient's Chart, lab work & pertinent test results  History of Anesthesia Complications Negative for: history of anesthetic complications  Airway Mallampati: I       Dental  (+) Teeth Intact, Dental Advidsory Given   Pulmonary neg shortness of breath, COPD, neg recent URI, former smoker,     (-) wheezing  rales (few scattered crackles, adequate air entry)    Cardiovascular Exercise Tolerance: Good negative cardio ROS   Rhythm:Regular Rate:Normal     Neuro/Psych  Headaches, PSYCHIATRIC DISORDERS Anxiety Depression    GI/Hepatic Neg liver ROS, PUD, GERD  Medicated,  Endo/Other  negative endocrine ROS  Renal/GU negative Renal ROS     Musculoskeletal   Abdominal Normal abdominal exam  (+)   Peds negative pediatric ROS (+)  Hematology negative hematology ROS (+)   Anesthesia Other Findings Past Medical History: No date: Abnormal respirations No date: Abscess of abdominal cavity (HCC) No date: Anxiety No date: Depression No date: Empyema of left pleural space (HCC) No date: GERD (gastroesophageal reflux disease) No date: Hypokalemia No date: Perforated ulcer (HCC) No date: Pleural effusion 08/17/2016: Protein-calorie malnutrition, severe No date: Respiratory distress   Reproductive/Obstetrics negative OB ROS                            Anesthesia Physical  Anesthesia Plan  ASA: II  Anesthesia Plan: General   Post-op Pain Management:    Induction: Intravenous  PONV Risk Score and Plan: TIVA and Propofol infusion  Airway Management Planned: Nasal Cannula  Additional Equipment:   Intra-op Plan:   Post-operative Plan:   Informed Consent: I have reviewed the patients History and Physical, chart, labs and discussed the procedure including the risks, benefits and  alternatives for the proposed anesthesia with the patient or authorized representative who has indicated his/her understanding and acceptance.     Plan Discussed with: Surgeon, Anesthesiologist and CRNA  Anesthesia Plan Comments:         Anesthesia Quick Evaluation

## 2018-01-20 NOTE — Anesthesia Postprocedure Evaluation (Signed)
Anesthesia Post Note  Patient: Lindsey Wolf  Procedure(s) Performed: ESOPHAGOGASTRODUODENOSCOPY (EGD) WITH PROPOFOL (N/A )  Patient location during evaluation: PACU Anesthesia Type: General Level of consciousness: awake and alert Pain management: pain level controlled Vital Signs Assessment: post-procedure vital signs reviewed and stable Respiratory status: spontaneous breathing, nonlabored ventilation and respiratory function stable Cardiovascular status: blood pressure returned to baseline and stable Postop Assessment: no apparent nausea or vomiting Anesthetic complications: no     Last Vitals:  Vitals:   01/20/18 1425 01/20/18 1435  BP: (!) 125/102 121/84  Pulse: 66 62  Resp: 20 14  Temp:    SpO2: 100% 100%    Last Pain:  Vitals:   01/20/18 1435  TempSrc:   PainSc: 0-No pain                 Durenda Hurt

## 2018-01-20 NOTE — H&P (Addendum)
Outpatient short stay form Pre-procedure 01/20/2018 1:28 PM Lindsey Sails MD  Primary Physician:  Dr Tracie Harrier  Reason for visit: EGD  History of present illness: Patient is a 52 year old female presenting today as above.  She has a personal history of a perforated gastric ulcer in February 2018 due to aspirin use.  Her complicated course with a Phillip Heal patch procedure percutaneous drain placements on hyperalimentation.  She did not have a gastrostomy at any point.  Did have a follow-up EGD on 02/19/2017 showing normal esophagus normal duodenum and evidence of previous gastric surgery in the antrum.  Was normal appearance.  He had a reconstructive surgery of a abdominal wall hernia on 11/21/2017 with use of mesh.  My office did contact her surgeon fine with [place] proceeding today with EGD.  Does have some issues with gastroesophageal reflux.  Upper GI series on 01/17/2018 showing diffuse gastric fold thickening "this could be from gastritis and/or malignancy".  There is narrowing of the gastric antrum noted.  And endoscopic evaluation was suggested.  Also had a CT scan of the abdomen pelvis prior to that on 10/11/2017 showing a right periumbilical hernia containing fat and a [place] supraumbilical ventral abdominal hernia containing portions of the transverse colon.  These were tolerated with the most recent surgery as noted above.  On that CT scan the stomach was felt to be within normal limits there is no evidence of bowel wall thickening distention or inflammatory changes.  Patient does have some history of lower abdominal discomfort was taking some fiber bars.  That was recently changed to [place].  Also suggested to try some Phazyme or Gas-X.  There is some occasional right upper quadrant discomfort.  However of note there were no gallstones and gallbladder wall was normal thickness there is no biliary dilatation on the most recent CT scan.  She is currently on a PPI.  She presents today for  further evaluation.  She has minimal nausea.  There is no emesis.  Abdominal pain is described above.    Current Facility-Administered Medications:  .  0.9 %  sodium chloride infusion, , Intravenous, Continuous, Lindsey Sails, MD, Last Rate: 20 mL/hr at 01/20/18 1249, 1,000 mL at 01/20/18 1249  Medications Prior to Admission  Medication Sig Dispense Refill Last Dose  . albuterol (PROVENTIL HFA;VENTOLIN HFA) 108 (90 Base) MCG/ACT inhaler Inhale 2 puffs into the lungs every 6 (six) hours as needed for wheezing or shortness of breath.   Past Month at Unknown time  . ALPRAZolam (XANAX) 0.5 MG tablet Take 0.5 mg by mouth 2 (two) times daily as needed for anxiety.    01/20/2018 at [place]  . loratadine (CLARITIN) 10 MG tablet Take 10 mg by mouth daily as needed for allergies.   01/19/2018 at Unknown time  . pantoprazole (PROTONIX) 40 MG tablet TAKE 1 TABLET BY MOUTH TWICE DAILY (Patient taking differently: Take 40 mg by mouth twice daily as needed for acid reflux) 60 tablet 1 01/19/2018 at Unknown time  . Triamcinolone Acetonide (NASACORT ALLERGY 24HR NA) Place 2 sprays into the nose daily.   Past Week at Unknown time  . vitamin B-12 (CYANOCOBALAMIN) 500 MCG tablet Take 500 mcg by mouth daily.   Past Week at Unknown time  . CHANTIX 1 MG tablet Take 1 mg by mouth 2 (two) times daily.  0 Not Taking at Unknown time  . cyclobenzaprine (FEXMID) 7.5 MG tablet Take 1 tablet (7.5 mg total) by mouth 3 (three) times daily. (Patient not  taking: Reported on 01/20/2018) 30 tablet 0 Not Taking at Unknown time  . gabapentin (NEURONTIN) 600 MG tablet Take 1 tablet (600 mg total) by mouth 3 (three) times daily. (Patient not taking: Reported on 01/20/2018) 60 tablet 2 Not Taking at Unknown time  . topiramate (TOPAMAX) 25 MG tablet Take 1 tablet by mouth 2 (two) times daily.   Not Taking at Unknown time  . traZODone (DESYREL) 100 MG tablet Take 100 mg by mouth at bedtime as needed for sleep.   Not Taking at Unknown time  .  venlafaxine XR (EFFEXOR-XR) 75 MG 24 hr capsule Take 75 mg by mouth daily.    11/21/2017 at 0800     No Known Allergies   Past Medical History:  Diagnosis Date  . Abnormal respirations   . Abscess of abdominal cavity ([place])   . Anxiety   . Depression   . Empyema of left pleural space ([place])   . GERD (gastroesophageal reflux disease)   . Hypokalemia   . Perforated ulcer ([place])   . Pleural effusion   . Protein-calorie malnutrition, severe 08/17/2016  . Respiratory distress     Review of systems:      Physical Exam    Heart and lungs: Regular rate and rhythm without rub or gallop, lungs are bilaterally clear.    HEENT: Normocephalic atraumatic eyes are anicteric    Other:    Pertinant exam for procedure: Soft nontender nondistended bowel sounds positive normoactive    Planned proceedures: EGD and indicated procedures. I have discussed the risks benefits and complications of procedures to include not limited to bleeding, infection, perforation and the risk of sedation and the patient wishes to proceed.    Lindsey Sails, MD Gastroenterology 01/20/2018  1:28 PM

## 2018-01-20 NOTE — Op Note (Signed)
[facility] Gastroenterology Patient Name: Lindsey Wolf Procedure Date: 01/20/2018 1:25 PM MRN: [id number] Account #: [id number] Date of Birth: 05-14-1966 Admit Type: Outpatient Age: 52 Room: [facility] ENDO ROOM 1 Gender: Female Note Status: Finalized Procedure:            Upper GI endoscopy Indications:          Epigastric abdominal pain, Dyspepsia, Abnormal UGI                        series, Nausea Providers:            Lollie Sails, MD Referring MD:         Tracie Harrier, MD (Referring MD) Medicines:            Monitored Anesthesia Care Complications:        No immediate complications. Procedure:            Pre-Anesthesia Assessment:                       - ASA Grade Assessment: II - A patient with mild                        systemic disease.                       After obtaining informed consent, the endoscope was                        passed under direct vision. Throughout the procedure,                        the patient's blood pressure, pulse, and oxygen                        saturations were monitored continuously. The Endoscope                        was introduced through the mouth, and advanced to the                        third part of duodenum. The upper GI endoscopy was                        accomplished without difficulty. The patient tolerated                        the procedure well. Findings:      A widely patent and non-obstructing Schatzki ring was found at the       gastroesophageal junction.      The Z-line was regular. Biopsies were taken with a cold forceps for       histology.      The exam of the esophagus was otherwise normal.      Diffuse mild inflammation characterized by congestion (edema) and       erythema was found in the gastric body and in the gastric antrum.       Biopsies were taken with a cold forceps for histology.      Evidence of a previous repair of gastric ulcer were found in the gastric       antrum.  This was characterized by healthy appearing mucosa,  however       evidence of a stellate appearance with prominant folds. Biopsies were       taken with a cold forceps for histology. There is no active ulceration.      The cardia and gastric fundus were normal on retroflexion.      A mild post-ulcer deformity was found in the duodenal bulb with a sharp       angulation of the sumen. There is evidence of old scaring in the second       portion and the bulb. There is no active ulceration.      The exam of the esophagus was otherwise normal.      A small hiatal hernia was found. The Z-line was a variable distance from       incisors; the hiatal hernia was sliding. Impression:           - Widely patent and non-obstructing Schatzki ring.                       - Z-line regular. Biopsied.                       - Bile gastritis. Biopsied.                       - A previous repair of gastric ulcer were found,                        characterized by healthy appearing mucosa. Biopsied.                       - Duodenal deformity. Recommendation:       - Continue present medications.                       - Use sucralfate suspension 1 gram PO QID for 1 month.                       - Return to GI clinic in 1 month.                       - Use Protonix (pantoprazole) 40 mg PO BID, take 30-45                        minutes before breakfast and supper. Procedure Code(s):    --- Professional ---                       [phone], Esophagogastroduodenoscopy, flexible, transoral;                        with biopsy, single or multiple Diagnosis Code(s):    --- Professional ---                       K22.2, Esophageal obstruction                       K29.60, Other gastritis without bleeding                       K31.89, Other diseases of stomach and duodenum  R10.13, Epigastric pain                       R11.0, Nausea                       R93.3, Abnormal findings on diagnostic imaging of  other                        parts of digestive tract CPT copyright 2017 American Medical Association. All rights reserved. The codes documented in this report are preliminary and upon coder review may  be revised to meet current compliance requirements. Lollie Sails, MD 01/20/2018 2:10:31 PM This report has been signed electronically. Number of Addenda: 0 Note Initiated On: 01/20/2018 1:25 PM      [facility]

## 2018-01-20 NOTE — Anesthesia Post-op Follow-up Note (Signed)
Anesthesia QCDR form completed.        

## 2018-01-22 ENCOUNTER — Encounter: Payer: Self-pay | Admitting: Gastroenterology

## 2018-01-22 ENCOUNTER — Ambulatory Visit (INDEPENDENT_AMBULATORY_CARE_PROVIDER_SITE_OTHER): Payer: BLUE CROSS/BLUE SHIELD | Admitting: Surgery

## 2018-01-22 VITALS — BP 128/72 | HR 76 | Temp 97.8°F | Resp 12 | Ht 70.0 in | Wt 180.0 lb

## 2018-01-22 DIAGNOSIS — Z09 Encounter for follow-up examination after completed treatment for conditions other than malignant neoplasm: Secondary | ICD-10-CM

## 2018-01-22 MED ORDER — CYCLOBENZAPRINE HCL 5 MG PO TABS: 5.0000 mg | ORAL_TABLET | Freq: Three times a day (TID) | ORAL | 0 refills | Status: DC

## 2018-01-22 MED ORDER — GABAPENTIN 300 MG PO CAPS: 300.0000 mg | ORAL_CAPSULE | Freq: Three times a day (TID) | ORAL | 0 refills | Status: DC

## 2018-01-22 NOTE — Patient Instructions (Addendum)
Patient to return in one month. The patient is aware to call back for any questions or concerns. Rx sent.

## 2018-01-22 NOTE — Progress Notes (Signed)
S/p abd wall reconstruction 6/20 Was doing well up to 2 weeks ago  Having Some Right sided chest wall pain CXR no acute pathology Recent EGD showing no recurrent ulcer, no CA  + PO, some constipation  PE NAD Chest: some TTP R chest wall Abd: incision c/d/i, no infection or recurrence  A/p Some R chest wall pain ? IC neuralgia, she did have a large piece of mesh and that sometimes can create some discomfort No evidence of complications  Related to her surgery RTC 3-4 weeks Flexeril and gabapentin for sxs rx

## 2018-01-24 LAB — SURGICAL PATHOLOGY

## 2018-02-10 ENCOUNTER — Ambulatory Visit
Admission: RE | Admit: 2018-02-10 | Discharge: 2018-02-10 | Disposition: A | Discharge status: Home / Self Care | Payer: BLUE CROSS/BLUE SHIELD | Source: Ambulatory Visit | Attending: Internal Medicine | Admitting: Internal Medicine

## 2018-02-10 DIAGNOSIS — Z1231 Encounter for screening mammogram for malignant neoplasm of breast: Secondary | ICD-10-CM | POA: Diagnosis not present

## 2018-02-19 ENCOUNTER — Ambulatory Visit (INDEPENDENT_AMBULATORY_CARE_PROVIDER_SITE_OTHER): Payer: BLUE CROSS/BLUE SHIELD | Admitting: Surgery

## 2018-02-19 ENCOUNTER — Encounter: Payer: Self-pay | Admitting: Surgery

## 2018-02-19 VITALS — BP 113/81 | HR 80 | Temp 97.7°F | Ht 68.0 in | Wt 183.0 lb

## 2018-02-19 DIAGNOSIS — Z09 Encounter for follow-up examination after completed treatment for conditions other than malignant neoplasm: Secondary | ICD-10-CM

## 2018-02-19 NOTE — Progress Notes (Signed)
S/p abdominal wall reconstruction with mesh close to 3 months ago.  She has recovered completely and is having no symptoms at this time.  Fevers no chills all her incisions are completely healed  PE NAD Abd soft and flat, no recurrence. No infection or peritonitis.  A/P Doing very well after abdominal wall reconstruction RTC prn No restrictions

## 2018-02-19 NOTE — Patient Instructions (Signed)
Patient to return as needed. The patient is aware to call back for any questions or concerns. 

## 2018-03-03 ENCOUNTER — Encounter: Payer: Self-pay | Admitting: *Deleted

## 2018-03-04 ENCOUNTER — Ambulatory Visit
Admission: RE | Admit: 2018-03-04 | Payer: BLUE CROSS/BLUE SHIELD | Source: Ambulatory Visit | Admitting: Gastroenterology

## 2018-03-04 ENCOUNTER — Encounter: Admission: RE | Payer: Self-pay | Source: Ambulatory Visit

## 2018-03-04 SURGERY — COLONOSCOPY WITH PROPOFOL
Anesthesia: General

## 2018-03-17 ENCOUNTER — Encounter (INDEPENDENT_AMBULATORY_CARE_PROVIDER_SITE_OTHER): Payer: BLUE CROSS/BLUE SHIELD | Admitting: Podiatry

## 2018-03-17 ENCOUNTER — Ambulatory Visit: Payer: BLUE CROSS/BLUE SHIELD

## 2018-03-17 DIAGNOSIS — M779 Enthesopathy, unspecified: Secondary | ICD-10-CM

## 2018-03-17 NOTE — Progress Notes (Signed)
This encounter was created in error - please disregard.

## 2018-03-20 ENCOUNTER — Ambulatory Visit: Payer: BLUE CROSS/BLUE SHIELD | Attending: Orthopedic Surgery | Admitting: Physical Therapy

## 2018-03-20 ENCOUNTER — Encounter: Payer: Self-pay | Admitting: Physical Therapy

## 2018-03-20 ENCOUNTER — Other Ambulatory Visit: Payer: Self-pay

## 2018-03-20 DIAGNOSIS — M546 Pain in thoracic spine: Secondary | ICD-10-CM | POA: Diagnosis present

## 2018-03-20 DIAGNOSIS — M545 Low back pain, unspecified: Secondary | ICD-10-CM

## 2018-03-20 DIAGNOSIS — M6281 Muscle weakness (generalized): Secondary | ICD-10-CM | POA: Insufficient documentation

## 2018-03-20 NOTE — Patient Instructions (Signed)
HOME EXERCISE PROGRAM Access Code: [email or web address]  URL: [email or web address]  Date: 03/20/2018  Prepared by: Rosita Kea   Exercises  Hooklying Transversus Abdominis Palpation - 2 sets - 10-15 reps - 1 breath hold - 2x daily  Supine March - 2 sets - 15 reps - 1 second hold - 2x daily  Supine Bridge - 2 sets - 15 reps - 5 second hold - 2x daily  Prone Press Up - 10-15 reps - 1 second hold - 2x daily

## 2018-03-20 NOTE — Therapy (Signed)
[facility] [address] [place], [address] Phone: [phone]   Fax:  [phone]  Physical Therapy Evaluation  Patient Details  Name: Lindsey Wolf MRN: [id number] Date of Birth: [birth year] Referring Provider (PT): Nunzio Cobbs [place], [place]   Encounter Date: 03/20/2018  Patient is a 52 y.o. female referred to outpatient physical therapy for core strengthening program due to lumbar spondylosis. She reports her chief complaint is pain and weak feeling in the low and upper back. She also complains of bilateral knee and hip pain.   She reports her back pain started about 2-3 months ago. She has a history of extensive abdominal surgery over this summer and her physician felt that her core had become weakened and was contributing her back pain. States walking is the hardest activity for her to complete due to pain in her legs and back.   Over the summer, pt was hospitalized with perforated ulcers, severe bleeding, and sepsis. An absence was removed, her lungs collapsed and she was put on a respirator. She states she almost died and became very malnourished. She has now gained back the weight and feels like she is overweight now. She has always had trouble with depression and anxiety and while she was in the hospital her mother had passed away (she was an only child). She has a history of being physically active with aerobic activity at home but has not felt motivated to return to it since her episode in the hospital. 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 She cannot take any medication for arthritis because of issues with her stomach. She has received cortisone injections in the knees without results and more recently "gel" shots in the knees with no effects now.  She also suffers from insomnia.   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 Requires sitting, occasionally lifting boxes.   Takes care of 15 year old son (shares with father, currently in custody battle with him).   Previous treatment for back: chiropractic care (maybe has helped, 2 visits, plans to continue).    Past Medical History:  Diagnosis Date  . Abnormal respirations   . Abscess of abdominal cavity ([place])   . Anxiety   . Depression   . Empyema of left pleural space ([place])   . GERD (gastroesophageal reflux disease)   . Hypokalemia   . Perforated ulcer ([place])   . Pleural effusion   . Protein-calorie malnutrition, severe 08/17/2016  . Respiratory distress     Past Surgical History:  Procedure Laterality Date  . ABDOMINAL WALL DEFECT REPAIR N/A 11/21/2017   Procedure: REPAIR ABDOMINAL WALL, ABDOMINAL WALL RECONSTRUCTION with mesh;  Surgeon: Jules Husbands, MD;  Location: [facility] ORS;  Service: General;  Laterality: N/A;  . APPLICATION OF WOUND VAC N/A 08/12/2016   Procedure: ,remove 2  JP drains,add one pennrose;  Surgeon: Florene Glen, MD;  Location: [facility]S;  Service: General;  Laterality: N/A;  . CHEST TUBE INSERTION Right 08/08/2016   Procedure: CHEST TUBE INSERTION;  Surgeon: [name], MD;  Location: [facility];  Service: General;  Laterality: Right;  . DEBRIDEMENT OF ABDOMINAL WALL ABSCESS N/A 08/08/2016   Procedure: DEBRIDEMENT OF ABDOMINAL WALL ABSCESS;  Surgeon: Florene Glen, MD;  Location:  [facility];  Service: General;  Laterality: N/A;  . DIAGNOSTIC LAPAROSCOPY    . ESOPHAGOGASTRODUODENOSCOPY (EGD) WITH PROPOFOL N/A 02/19/2017   Procedure: ESOPHAGOGASTRODUODENOSCOPY (EGD) WITH PROPOFOL;  Surgeon: Jonathon Bellows, MD;  Location: [facility] ENDOSCOPY;  Service: Gastroenterology;  Laterality: N/A;  . ESOPHAGOGASTRODUODENOSCOPY (EGD) WITH PROPOFOL N/A 01/20/2018   Procedure: ESOPHAGOGASTRODUODENOSCOPY (EGD) WITH PROPOFOL;  Surgeon: Lollie Sails, MD;  Location: [facility] ENDOSCOPY;  Service: Endoscopy;  Laterality: N/A;  . LAPAROTOMY N/A 07/20/2016   Procedure: EXPLORATORY LAPAROTOMY;  Surgeon: Jules Husbands, MD;  Location: [facility];  Service: General;  Laterality: N/A;  . LAPAROTOMY N/A 07/30/2016   Procedure: EXPLORATORY LAPAROTOMY drainage peritoneal abscess;  Surgeon: Clayburn Pert, MD;  Location: [facility] ORS;  Service: General;  Laterality: N/A;  . LYSIS OF ADHESION  07/30/2016   Procedure: LYSIS OF ADHESION;  Surgeon: Clayburn Pert, MD;  Location: [facility] ORS;  Service: General;;  . TONSILLECTOMY    . VIDEO ASSISTED THORACOSCOPY (VATS)/THOROCOTOMY Left 08/08/2016   Procedure: VIDEO ASSISTED THORACOSCOPY (VATS)/THOROCOTOMY Possible Thoracotomy;  Surgeon: [name], MD;  Location: [facility] ORS;  Service: General;  Laterality: Left;    There were no vitals filed for this visit.   Subjective Assessment - 03/20/18 1603    Subjective  Patient is a 52 y.o. female referred to outpatient physical therapy for core strengthening program due to lumbar spondylosis. She reports her chief complaint is pain and weak feeling in the low and upper back. She also complains of bilateral knee and hip pain.     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    Limitations  Sitting;Standing;Walking;Lifting;House hold activities   bending   How long can you sit comfortably?  1-1.5 hours    How long can you stand comfortably?  30 min    How long can you walk comfortably?  1 hour on level ground, 30 min uphill    Diagnostic tests  Radiographs (reports a little bit of arthritis)    Patient Stated Goals  to get stronger, hopefully lose some of this weight, start exercising  for fitness.     Pain Onset  More than a month ago        [facility] PT Assessment - 03/20/18 1411      Assessment   Medical Diagnosis  Lumbar spondylosis    Referring Provider (PT)  Feliberto Gottron, [place]    Onset Date/Surgical Date  12/18/17    Next MD Visit  03/31/2018    Prior Therapy  none      Precautions   Precautions  None      Restrictions   Weight Bearing Restrictions  No      Balance Screen   Has the patient fallen in the past 6 months  No    Has the patient had a decrease in activity level because of a fear of falling?   Yes    Is the patient reluctant to leave their home because of a fear of falling?   Yes      [place]  Private residence    Living Arrangements  Children    Available Help at Discharge  Family    Type of [place] to enter  Entrance Stairs-Number of Steps  5    Entrance Stairs-Rails  Left    Home Layout  One level    Home Equipment  None      Prior Function   Level of Independence  Independent    Vocation  Other (comment)   self employed accountant, part time currently   Vocation Requirements  sitting, occasional lifting boxes    Leisure  crafting, painting furnature, decorating      Cognition   Overall Cognitive Status  Within Functional Limits for tasks assessed      Observation/Other Assessments   Observations  rounded shoulders, thoracic hyperkyphosis, lumbar curve WNL      Sensation   Light Touch  Appears Intact   except diminished left L5     Coordination   Gross Motor Movements are Fluid and Coordinated  Yes    Fine Motor Movements are Fluid and Coordinated  Yes      Posture/Postural Control   Posture/Postural Control  Postural limitations    Postural Limitations  --   Abdominal endu. test: 35 seconds at 45 degree angle,shaking     AROM   Overall AROM Comments  bilateral hips and knees WNL    Lumbar Flexion  100% to toes    Lumbar Extension  50%, pulling  in front    Lumbar - Right Side Bend  WNL    Lumbar - Left Side Bend  WNL    Lumbar - Right Rotation  WNL    Lumbar - Left Rotation  WNL      Strength   Overall Strength Comments  BLE grossly 4+/5    Strength Assessment Site  Lumbar    Lumbar Flexion  --   35 seconds unsupported flexion at 45 degree hip angle, shaki     Palpation   Spinal mobility  hypomobile to CPA throughout thoracic spine and lumbar spine. tender at lowest lumbar levels to CPA    SI assessment   TTP over B SIJ    Palpation comment  Slight TTP over left glute region      Special Tests   Other special tests  Flexion in standing: produce, no worse; Extension in standing: no effect. Repeated extension in standing: no effect; Repeated extension in lying: stretch felt over abdominal scars, lumbar min effect/unable to fully assess due to  unable to reach end range with anterior restriction     OBJECTIVE EXAM:   OBSERVATION/INSPECTION: Pt demonstrates excessive thoracic kyposis, normal lumbar lordosis, no obvious spinal shift.   NEUROLOGICAL: Dermatomes: WNL except diminished to light touch at left L5 Myotomes: WNL. Reflexes:  - Biceps brachii reflex (C5, C6): B = 3+ - Brachioradialis reflex (C6): B = 3+. - Triceps brachii reflex (C7): B = 3+ Reflexes:  - Quadriceps reflex (L4): B = 2+ - Achilles reflex (S1): B = 0 (unable to relax) Upper Motor Neuron Screen:  - Hoffman's: B = negative - Clonus: R = B ankle = negative  SPINE MOTION: Lumbar AROM: - Flexion: = to toes, painful in back - Extension: = 50%, no pain  - Rotation: B = WFL, tender in low back - Side Flexion: B = WFL, tender in low back - Side glide: WFL = tender in low back with OP.  PERIPHERAL JOINT MOTION (AROM/PROM in degrees): - Bilateral hip and knees grossly WNL  STRENGTH:  BLE grossly 4+/5 Trunk flexion endurance: abdominal isometric 45 degrees: 35 seconds, shaking  MDT REPEATED MOTIONS TESTING  Lumbar flexion in standing:  increased,  no worse.  Lumbar extension in standing: no effect Repeated lumbar extension in standing: no effect Repeated lumbar extension in prone (prone press up): limited by abdominal scarring, unable to reach end range spinal motion, increase no worse for abdominal pain.   SPECIAL TESTS: SIJ compression: negative Sacral thrust: positive for reproduction of pain.   ACCESSORY MOTION:  - Hypomobile to CPA over thoracic and lumbar spine - Tender to CPA at caudal end of lumbar spine.  - Tender to AP pressure over scarum  PALPATION: - TTP over left gluteal region, B SIJs.     Objective measurements completed on examination: See above findings.    TREATMENT:   Therapeutic exercise: to centralize symptoms and improve ROM and strength required for successful completion of functional activities.  - TrA contraction with breathing, x 10 - TrA contraction with marching, x 10 each - TrA contraction with bridge, x 10 - Prone press up x 15 - Education on diagnosis, prognosis, POC, anatomy and physiology of current condition.  - Education on HEP including handout    HOME EXERCISE PROGRAM Access Code: [email or web address]  URL: [email or web address]  Date: 03/20/2018  Prepared by: Rosita Kea   Exercises  Hooklying Transversus Abdominis Palpation - 2 sets - 10-15 reps - 1 breath hold - 2x daily  Supine March - 2 sets - 15 reps - 1 second hold - 2x daily  Supine Bridge - 2 sets - 15 reps - 5 second hold - 2x daily  Prone Press Up - 10-15 reps - 1 second hold - 2x daily    Patient response to skilled treatment:  Pt tolerated initial eval and treatment well. Pt was able to complete all exercises with minimal to no lasting increase in pain or discomfort. Pt required cuing for proper technique and to facilitate improved neuromuscular control, strength, range of motion, and functional ability.   ASSESSMENT:  Patient is a 52 y.o. female referred to outpatient physical therapy with a diagnosis of  lumbar spondylosis who presents with signs and symptoms consistent with low back and thoracic pain with decreased core strength and activity tolerance that is restricting her ability to complete usual activities such as lifting, bending, standing, sitting, and working.   Plan - 03/20/18 1614    Clinical Impression Statement  Patient is a 52 y.o. female referred to outpatient physical therapy with a diagnosis of lumbar spondylosis who presents with signs and symptoms consistent with low back and thoracic pain with decreased core strength and activity tolerance that is restricting her ability to complete usual activities such as lifting, bending, standing, sitting, and working.     History and Personal Factors relevant to plan of care:  Recent hospitalization with sepsis, abdominal surgery, respirator, depression, insomnia, current custody battle, weight gain, lack of motivation, knee, hip and thumb pain.    Clinical Presentation  Stable    Clinical Presentation due to:  Patients condition has stablized and is not worsening.     Clinical Decision Making  Low    Rehab Potential  Good    Clinical Impairments Affecting Rehab Potential  stiffness in joints, decreased muscular strength and endurance, abnormal posture, pain, decreased activity tolerance.     PT Frequency  2x / week    PT Duration  6 weeks    PT Treatment/Interventions  ADLs/Self Care Home Management;Electrical Stimulation;Moist Heat;Gait training;Stair training;Functional mobility training;Therapeutic activities;Therapeutic exercise;Balance training;Neuromuscular re-education;Patient/family education;Manual techniques;Dry needling;Energy conservation;Passive range of motion;Scar mobilization;Spinal Manipulations;Joint Manipulations    PT Next Visit Plan  Progress trunk and hip strengthening as tolerated.     PT Home Exercise Plan  Medbridge: [email or web address]     Consulted and Agree with Plan of Care  Patient       Patient will benefit from  skilled therapeutic intervention in order to improve the following deficits and impairments:  Decreased endurance, Decreased mobility, Decreased activity tolerance, Decreased strength, Impaired flexibility, Pain, Postural dysfunction, Cardiopulmonary status limiting activity, Decreased range of motion, Decreased scar mobility, Obesity, Improper body mechanics, Impaired perceived functional ability, Increased muscle spasms, Difficulty walking(sitting, standing, walking, squatting, liting, bending, carrying. )  Visit Diagnosis: Acute midline low back pain without sciatica  Pain in thoracic spine  Muscle weakness (generalized)     Problem List Patient Active Problem List   Diagnosis Date Noted  . S/P repair of ventral hernia 11/21/2017  . Depressed mood 11/08/2017  . Headache disorder 11/08/2017  . Anxiety   . Depression   . GERD (gastroesophageal reflux disease)   . Hypokalemia   . Protein-calorie malnutrition, severe 08/17/2016  . Empyema of left pleural space ([place])   . Abscess of abdominal cavity ([place])   . Abnormal respirations   . Pleural effusion   . Respiratory distress   . Perforated ulcer ([place])     Everlean Alstrom. Graylon Good, PT, DPT 03/20/18, 4:29 PM  [facility] [address], [place], [address] Phone: [phone]   Fax:  [phone]  Name: Lindsey Wolf MRN: [id number] Date of Birth: 25-Nov-1965

## 2018-03-24 ENCOUNTER — Telehealth: Payer: Self-pay | Admitting: Physical Therapy

## 2018-03-24 ENCOUNTER — Ambulatory Visit: Payer: BLUE CROSS/BLUE SHIELD | Admitting: Physical Therapy

## 2018-03-24 NOTE — Telephone Encounter (Signed)
Patient no-showed to her appointment today 03/24/2018 at 2:30. Called patient to check on her. She answered her cell number and stated she thought her appt was tomorrow and that she still needed to go through her appointment book and find times that will work. States that 2:30 to 3:15 will not work for her because that is when she has to pick up her son. Stated she will call back and discuss it with front office staff when she has a chance to go over her appt book. States she thinks she has our phone number. I offered to give her our phone number but she did not have a pen to write it down. Made her aware of her next day and time we have scheduled for her (03/27/2018 at 3:15pm) and she said it would not work.

## 2018-03-27 ENCOUNTER — Encounter: Payer: BLUE CROSS/BLUE SHIELD | Admitting: Physical Therapy

## 2018-03-31 ENCOUNTER — Ambulatory Visit: Payer: BLUE CROSS/BLUE SHIELD | Admitting: Physical Therapy

## 2018-04-02 ENCOUNTER — Ambulatory Visit: Payer: BLUE CROSS/BLUE SHIELD | Admitting: Physical Therapy

## 2018-04-08 ENCOUNTER — Ambulatory Visit: Payer: BLUE CROSS/BLUE SHIELD | Attending: Orthopedic Surgery | Admitting: Physical Therapy

## 2018-04-08 ENCOUNTER — Telehealth: Payer: Self-pay | Admitting: Physical Therapy

## 2018-04-08 NOTE — Telephone Encounter (Signed)
called pt after no-show for today's appt at 1pm. Pt states still sick and has call in to doctor. Wants to cancel both appts this week. notified front desk.

## 2018-04-10 ENCOUNTER — Ambulatory Visit: Payer: BLUE CROSS/BLUE SHIELD | Admitting: Physical Therapy

## 2018-04-14 ENCOUNTER — Telehealth: Payer: Self-pay | Admitting: Physical Therapy

## 2018-04-14 ENCOUNTER — Ambulatory Visit: Payer: BLUE CROSS/BLUE SHIELD | Admitting: Physical Therapy

## 2018-04-14 NOTE — Telephone Encounter (Signed)
Called and spoke to pt after she no-showed to her appointment today at 10:30am. She said she was in the MD office right now due to ongoing illness. Requested to be taken off PT schedule until she is better and calls back to make more appointments. I notified front office staff of this.

## 2018-04-16 ENCOUNTER — Ambulatory Visit: Payer: BLUE CROSS/BLUE SHIELD | Admitting: Physical Therapy

## 2018-04-21 ENCOUNTER — Ambulatory Visit: Payer: BLUE CROSS/BLUE SHIELD | Admitting: Physical Therapy

## 2018-04-23 ENCOUNTER — Encounter: Payer: Self-pay | Admitting: Physical Therapy

## 2018-04-23 DIAGNOSIS — M545 Low back pain, unspecified: Secondary | ICD-10-CM

## 2018-04-23 DIAGNOSIS — M546 Pain in thoracic spine: Secondary | ICD-10-CM

## 2018-04-23 DIAGNOSIS — M6281 Muscle weakness (generalized): Secondary | ICD-10-CM

## 2018-04-23 NOTE — Therapy (Signed)
[facility] [address], [place], [address] Phone: [phone]   Fax:  [phone]  Physical Therapy Treatment  Patient Details  Name: Lindsey Wolf MRN: [id number] Date of Birth: [birth year] Referring Provider (PT): Nunzio Cobbs [place], [place]   Encounter Date: 04/23/2018    Past Medical History:  Diagnosis Date  . Abnormal respirations   . Abscess of abdominal cavity ([place])   . Anxiety   . Depression   . Empyema of left pleural space ([place])   . GERD (gastroesophageal reflux disease)   . Hypokalemia   . Perforated ulcer ([place])   . Pleural effusion   . Protein-calorie malnutrition, severe 08/17/2016  . Respiratory distress     Past Surgical History:  Procedure Laterality Date  . ABDOMINAL WALL DEFECT REPAIR N/A 11/21/2017   Procedure: REPAIR ABDOMINAL WALL, ABDOMINAL WALL RECONSTRUCTION with mesh;  Surgeon: Jules Husbands, MD;  Location: [facility] ORS;  Service: General;  Laterality: N/A;  . APPLICATION OF WOUND VAC N/A 08/12/2016   Procedure: ,remove 2  JP drains,add one pennrose;  Surgeon: Florene Glen, MD;  Location: [facility] ORS;  Service: General;  Laterality: N/A;  . CHEST TUBE INSERTION Right 08/08/2016   Procedure: CHEST TUBE INSERTION;  Surgeon: [name], MD;  Location: [facility] ORS;  Service: General;  Laterality: Right;  . DEBRIDEMENT OF ABDOMINAL WALL ABSCESS N/A 08/08/2016   Procedure: DEBRIDEMENT OF ABDOMINAL WALL ABSCESS;  Surgeon: Florene Glen, MD;  Location: [facility] ORS;  Service: General;  Laterality: N/A;  . DIAGNOSTIC LAPAROSCOPY    . ESOPHAGOGASTRODUODENOSCOPY (EGD) WITH PROPOFOL N/A 02/19/2017   Procedure: ESOPHAGOGASTRODUODENOSCOPY (EGD) WITH PROPOFOL;  Surgeon: Jonathon Bellows, MD;  Location: [facility] ENDOSCOPY;  Service: Gastroenterology;  Laterality: N/A;  . ESOPHAGOGASTRODUODENOSCOPY (EGD) WITH PROPOFOL N/A 01/20/2018   Procedure: ESOPHAGOGASTRODUODENOSCOPY (EGD) WITH PROPOFOL;  Surgeon:  Lollie Sails, MD;  Location: [facility] ENDOSCOPY;  Service: Endoscopy;  Laterality: N/A;  . LAPAROTOMY N/A 07/20/2016   Procedure: EXPLORATORY LAPAROTOMY;  Surgeon: Jules Husbands, MD;  Location: [facility] ORS;  Service: General;  Laterality: N/A;  . LAPAROTOMY N/A 07/30/2016   Procedure: EXPLORATORY LAPAROTOMY drainage peritoneal abscess;  Surgeon: Clayburn Pert, MD;  Location: [facility] ORS;  Service: General;  Laterality: N/A;  . LYSIS OF ADHESION  07/30/2016   Procedure: LYSIS OF ADHESION;  Surgeon: Clayburn Pert, MD;  Location: [facility] ORS;  Service: General;;  . TONSILLECTOMY    . VIDEO ASSISTED THORACOSCOPY (VATS)/THOROCOTOMY Left 08/08/2016   Procedure: VIDEO ASSISTED THORACOSCOPY (VATS)/THOROCOTOMY Possible Thoracotomy;  Surgeon: [name], MD;  Location: [facility] ORS;  Service: General;  Laterality: Left;    There were no vitals filed for this visit.  Subjective Assessment - 04/23/18 0921    Subjective  Patient was unable to return to physical therapy after her initial evaluation due to becoming ill. After several failed appointments she requested discharge from physical therapy until she is feeling better. She is not present at discharge.     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    Limitations  Sitting;Standing;Walking;Lifting;House hold activities   bending   How long can you sit comfortably?  1-1.5 hours    How long can you stand comfortably?  30 min    How long can you walk comfortably?  1 hour on level ground, 30 min uphill    Diagnostic tests  Radiographs (reports a little bit of arthritis)    Patient Stated Goals  to get stronger, hopefully lose some of this weight, start exercising for  fitness.     Pain Onset  More than a month ago        PT Short Term Goals - 04/23/18 6811      PT SHORT TERM GOAL #1   Title  Be independent with home exercise program completed at least 3 times per week for self-management of symptoms.    Baseline  issued at initial eval    Time  2    Period  Weeks    Status  Not Met    Target Date  04/03/18      OBJECTIVE Pt not present for exam. Please see initial eval for latest objective data.   PT Long Term Goals - 04/23/18 5726      PT LONG TERM GOAL #1   Title  Be independent with a long-term home exercise program for self-management of symptoms.     Baseline  initial program provided    Time  6    Period  Weeks    Status  Not Met    Target Date  05/01/18      PT LONG TERM GOAL #2   Title  Improve Modified Oswestry Disability Index score to equal or less than 10% to demonstrate improved self reported function.     Baseline  not measured at initial eval.     Time  6    Period  Weeks    Status  Not Met    Target Date  05/01/18      PT LONG TERM GOAL #3   Title  Reduce pain with functional activities to equal or less than 1/10 to allow patient to complete usual activities including ADLs, IADLs, and social engagement with less difficulty.     Baseline  reports limitations in functional activities due to pain    Time  6    Period  Weeks    Status  Not Met    Target Date  05/01/18      PT LONG TERM GOAL #4   Title  Have full lumbar spine AROM with no increase in pain in all planes except intermittent end range discomfort to allow patient to complete valued activities with less difficulty.     Baseline  Baseline: lumbar extension 50% limited    Time  6    Period  Weeks    Status  Not Met    Target Date  05/01/18      PT LONG TERM GOAL #5   Title  Improve core strength to hold 45 degree unsupported reclined position for 60 seconds and modified plank for 60 seconds for improved core strength to allow patient to complete valued  functional tasks such as lifting, bending, sitting with less difficulty.     Baseline  Baseline: able to hold 45 degrees for 35 seconds.     Time  6    Period  Weeks    Status  Not Met    Target Date  05/01/18      PT LONG TERM GOAL #6   Title  Complete community, work and/or recreational activities without limitation due to current condition.     Baseline  limited in activities due to pain and fear of injury    Time  6  Period  Weeks    Status  Not Met    Target Date  05/01/18            Plan - 04/23/18 3016    Clinical Impression Statement  Patient attended initial eval only and did not return for further physical therapy due to becoming ill. After several failed appointments, she requested to discharge from physical therapy until she feels better. Patient was not able to meet goals due to inability to participate in physical therapy.     Rehab Potential  Good    Clinical Impairments Affecting Rehab Potential  stiffness in joints, decreased muscular strength and endurance, abnormal posture, pain, decreased activity tolerance.     PT Frequency  2x / week    PT Duration  6 weeks    PT Treatment/Interventions  ADLs/Self Care Home Management;Electrical Stimulation;Moist Heat;Gait training;Stair training;Functional mobility training;Therapeutic activities;Therapeutic exercise;Balance training;Neuromuscular re-education;Patient/family education;Manual techniques;Dry needling;Energy conservation;Passive range of motion;Scar mobilization;Spinal Manipulations;Joint Manipulations    PT Next Visit Plan  Patient is now discharged from physical therapy due to inability to attend.     PT Home Exercise Plan  Medbridge: [id number]     Consulted and Agree with Plan of Care  Patient       Patient will benefit from skilled therapeutic intervention in order to improve the following deficits and impairments:  Decreased endurance, Decreased mobility, Decreased activity tolerance, Decreased strength,  Impaired flexibility, Pain, Postural dysfunction, Cardiopulmonary status limiting activity, Decreased range of motion, Decreased scar mobility, Obesity, Improper body mechanics, Impaired perceived functional ability, Increased muscle spasms, Difficulty walking(sitting, standing, walking, squatting, liting, bending, carrying. )  Visit Diagnosis: Acute midline low back pain without sciatica  Pain in thoracic spine  Muscle weakness (generalized)     Problem List Patient Active Problem List   Diagnosis Date Noted  . S/P repair of ventral hernia 11/21/2017  . Depressed mood 11/08/2017  . Headache disorder 11/08/2017  . Anxiety   . Depression   . GERD (gastroesophageal reflux disease)   . Hypokalemia   . Protein-calorie malnutrition, severe 08/17/2016  . Empyema of left pleural space ([place])   . Abscess of abdominal cavity ([place])   . Abnormal respirations   . Pleural effusion   . Respiratory distress   . Perforated ulcer ([place])     Nancy Nordmann, PT, DPT 04/23/2018, 9:32 AM  [facility] [address] [place], [address] Phone: [phone]   Fax:  [phone]  Name: Lindsey Wolf MRN: [id number] Date of Birth: 1965-09-22

## 2018-04-24 ENCOUNTER — Encounter: Payer: BLUE CROSS/BLUE SHIELD | Admitting: Physical Therapy

## 2018-04-28 ENCOUNTER — Encounter: Payer: BLUE CROSS/BLUE SHIELD | Admitting: Physical Therapy

## 2018-04-28 ENCOUNTER — Encounter: Payer: Self-pay | Admitting: *Deleted

## 2018-04-29 ENCOUNTER — Ambulatory Visit: Payer: BLUE CROSS/BLUE SHIELD | Admitting: Anesthesiology

## 2018-04-29 ENCOUNTER — Encounter: Payer: Self-pay | Admitting: Anesthesiology

## 2018-04-29 ENCOUNTER — Encounter
Admission: RE | Disposition: A | Discharge status: Home / Self Care | Payer: Self-pay | Source: Ambulatory Visit | Attending: Gastroenterology

## 2018-04-29 ENCOUNTER — Ambulatory Visit
Admission: RE | Admit: 2018-04-29 | Discharge: 2018-04-29 | Disposition: A | Discharge status: Home / Self Care | Payer: BLUE CROSS/BLUE SHIELD | Source: Ambulatory Visit | Attending: Gastroenterology | Admitting: Gastroenterology

## 2018-04-29 DIAGNOSIS — Z87891 Personal history of nicotine dependence: Secondary | ICD-10-CM

## 2018-04-29 DIAGNOSIS — D123 Benign neoplasm of transverse colon: Secondary | ICD-10-CM

## 2018-04-29 DIAGNOSIS — K5730 Diverticulosis of large intestine without perforation or abscess without bleeding: Secondary | ICD-10-CM

## 2018-04-29 DIAGNOSIS — Z1211 Encounter for screening for malignant neoplasm of colon: Secondary | ICD-10-CM

## 2018-04-29 DIAGNOSIS — Z79899 Other long term (current) drug therapy: Secondary | ICD-10-CM | POA: Insufficient documentation

## 2018-04-29 DIAGNOSIS — F329 Major depressive disorder, single episode, unspecified: Secondary | ICD-10-CM | POA: Insufficient documentation

## 2018-04-29 DIAGNOSIS — F419 Anxiety disorder, unspecified: Secondary | ICD-10-CM

## 2018-04-29 DIAGNOSIS — D128 Benign neoplasm of rectum: Secondary | ICD-10-CM | POA: Diagnosis not present

## 2018-04-29 DIAGNOSIS — J90 Pleural effusion, not elsewhere classified: Secondary | ICD-10-CM | POA: Insufficient documentation

## 2018-04-29 DIAGNOSIS — K621 Rectal polyp: Secondary | ICD-10-CM | POA: Insufficient documentation

## 2018-04-29 DIAGNOSIS — K219 Gastro-esophageal reflux disease without esophagitis: Secondary | ICD-10-CM | POA: Insufficient documentation

## 2018-04-29 HISTORY — DX: Headache: R51

## 2018-04-29 HISTORY — DX: Headache, unspecified: R51.9

## 2018-04-29 HISTORY — PX: COLONOSCOPY WITH PROPOFOL: SHX5780

## 2018-04-29 SURGERY — COLONOSCOPY WITH PROPOFOL
Anesthesia: General

## 2018-04-29 SURGERY — Surgical Case
Anesthesia: *Unknown

## 2018-04-29 MED ORDER — SODIUM CHLORIDE 0.9 % IV SOLN
INTRAVENOUS | Status: DC
  Administered 2018-04-29: 1000 mL via INTRAVENOUS
  Administered 2018-04-29: 15:00:00 via INTRAVENOUS

## 2018-04-29 MED ORDER — PROPOFOL 500 MG/50ML IV EMUL
INTRAVENOUS | Status: AC
  Filled 2018-04-29: qty 50

## 2018-04-29 MED ORDER — FENTANYL CITRATE (PF) 100 MCG/2ML IJ SOLN
INTRAMUSCULAR | Status: AC
  Filled 2018-04-29: qty 2

## 2018-04-29 MED ORDER — PHENYLEPHRINE HCL 10 MG/ML IJ SOLN
INTRAMUSCULAR | Status: DC | PRN
  Administered 2018-04-29: 100 ug via INTRAVENOUS

## 2018-04-29 MED ORDER — PROPOFOL 500 MG/50ML IV EMUL
INTRAVENOUS | Status: DC | PRN
  Administered 2018-04-29: 175 ug/kg/min via INTRAVENOUS

## 2018-04-29 MED ORDER — PROPOFOL 10 MG/ML IV BOLUS
INTRAVENOUS | Status: DC | PRN
  Administered 2018-04-29: 30 mg via INTRAVENOUS
  Administered 2018-04-29: 70 mg via INTRAVENOUS

## 2018-04-29 MED ORDER — FENTANYL CITRATE (PF) 100 MCG/2ML IJ SOLN: 25.0000 ug | INTRAMUSCULAR | Status: DC | PRN

## 2018-04-29 MED ORDER — SODIUM CHLORIDE 0.9 % IV SOLN
INTRAVENOUS | Status: DC
  Administered 2018-04-29: 1000 mL via INTRAVENOUS

## 2018-04-29 MED ORDER — LACTATED RINGERS IV SOLN: INTRAVENOUS | Status: DC | PRN

## 2018-04-29 MED ORDER — ONDANSETRON HCL 4 MG/2ML IJ SOLN: 4.0000 mg | Freq: Once | INTRAMUSCULAR | Status: DC | PRN

## 2018-04-29 MED ORDER — SODIUM CHLORIDE 0.9 % IV SOLN: INTRAVENOUS | Status: DC

## 2018-04-29 MED ORDER — LIDOCAINE HCL (PF) 2 % IJ SOLN
INTRAMUSCULAR | Status: AC
  Filled 2018-04-29: qty 10

## 2018-04-29 NOTE — H&P (Signed)
Outpatient short stay form Pre-procedure 04/29/2018 2:50 PM Lindsey Sails MD  Primary Physician: Dr Tracie Harrier  Reason for visit: Colonoscopy  History of present illness: Patient is a 52 year old female presenting today for colonoscopy regards to colon cancer screening.  She has a complex medical medical/surgical history and that she had a perforated ulcer due to NSAID use in February 2018.  Had a large ventral hernia that had healed by secondary intention.  Subsequently she had a ventral hernia repair with mesh on 11/21/2017.  She tolerated her prep well.  He takes no aspirin or blood thinning agent with the exception of occasional 81 mg aspirin.  She said none of that for about 3 days.  I did discuss her with Dr. Dahlia Byes in regards to her recent ventral hernia repair and we feel it is okay to proceed.   No current facility-administered medications for this encounter.   Medications Prior to Admission  Medication Sig Dispense Refill Last Dose  . albuterol (PROVENTIL HFA;VENTOLIN HFA) 108 (90 Base) MCG/ACT inhaler Inhale 2 puffs into the lungs every 6 (six) hours as needed for wheezing or shortness of breath.   Past Week at Unknown time  . ALPRAZolam (XANAX) 0.5 MG tablet Take 0.25 mg by mouth 2 (two) times daily as needed for anxiety.    04/28/2018 at Unknown time  . buPROPion (WELLBUTRIN) 100 MG tablet Take 100 mg by mouth.   04/28/2018 at Unknown time  . CHANTIX 1 MG tablet Take 1 mg by mouth 2 (two) times daily.  0 Past Week at Unknown time  . hydrOXYzine (ATARAX/VISTARIL) 50 MG tablet 25 mg.    Past Week at Unknown time  . iron polysaccharides (NIFEREX) 150 MG capsule Take 150 mg by mouth daily.   Past Week at Unknown time  . loratadine (CLARITIN) 10 MG tablet Take 10 mg by mouth daily as needed for allergies.   Past Week at Unknown time  . nortriptyline (PAMELOR) 10 MG capsule Take 40 mg by mouth at bedtime.   Past Week at Unknown time  . Olopatadine HCl (PATADAY) 0.2 % SOLN  Apply 0.2 drops to eye daily.   Past Week at Unknown time  . ondansetron (ZOFRAN) 4 MG tablet Take 4 mg by mouth every 8 (eight) hours as needed for nausea or vomiting.   Past Week at Unknown time  . pantoprazole (PROTONIX) 40 MG tablet TAKE 1 TABLET BY MOUTH TWICE DAILY (Patient taking differently: Take 40 mg by mouth twice daily as needed for acid reflux) 60 tablet 1 04/28/2018 at Unknown time  . topiramate (TOPAMAX) 100 MG tablet    Past Week at Unknown time  . venlafaxine XR (EFFEXOR-XR) 75 MG 24 hr capsule Take 150 mg by mouth daily.    11/21/2017 at 0800  . vitamin B-12 (CYANOCOBALAMIN) 500 MCG tablet Take 500 mcg by mouth daily.   Past Week at Unknown time     No Known Allergies   Past Medical History:  Diagnosis Date  . Abnormal respirations   . Abscess of abdominal cavity ([place])   . Anxiety   . Depression   . Empyema of left pleural space ([place])   . GERD (gastroesophageal reflux disease)   . Headache   . Hypokalemia   . Perforated ulcer ([place])   . Pleural effusion   . Protein-calorie malnutrition, severe 08/17/2016  . Respiratory distress     Review of systems:      Physical Exam    Heart and lungs: Regular  rate and rhythm without rub or gallop, lungs are bilaterally clear.    HEENT: Normocephalic atraumatic eyes are anicteric    Other:    Pertinant exam for procedure: Soft nontender nondistended bowel sounds positive normoactive.  Her midline incision site is well-healed.    Planned proceedures: Colonoscopy and indicated procedures. I have discussed the risks benefits and complications of procedures to include not limited to bleeding, infection, perforation and the risk of sedation and the patient wishes to proceed.    Lindsey Sails, MD Gastroenterology 04/29/2018  2:50 PM

## 2018-04-29 NOTE — Op Note (Signed)
[facility] Gastroenterology Patient Name: Lindsey Wolf Procedure Date: 04/29/2018 3:01 PM MRN: [id number] Account #: [id number] Date of Birth: [birth year] Admit Type: Outpatient Age: 52 Room: [facility] ENDO ROOM 3 Gender: Female Note Status: Finalized Procedure:            Colonoscopy Indications:          Screening for colorectal malignant neoplasm, This is                        the patient's first colonoscopy Providers:            Lollie Sails, MD Referring MD:         Tracie Harrier, MD (Referring MD) Medicines:            Monitored Anesthesia Care Complications:        No immediate complications. Procedure:            Pre-Anesthesia Assessment:                       - ASA Grade Assessment: II - A patient with mild                        systemic disease.                       After obtaining informed consent, the colonoscope was                        passed under direct vision. Throughout the procedure,                        the patient's blood pressure, pulse, and oxygen                        saturations were monitored continuously. The was                        introduced through the anus and advanced to the the                        cecum, identified by appendiceal orifice and ileocecal                        valve. The colonoscopy was unusually difficult due to                        significant looping and a tortuous colon. Successful                        completion of the procedure was aided by changing the                        patient to a supine position and changing the patient                        to a prone position. The patient tolerated the                        procedure well. The quality of the bowel preparation  was good. Findings:      A 3 mm polyp was found in the rectum. The polyp was sessile. The polyp       was removed with a cold biopsy forceps. Resection and retrieval were       complete.  A 6 mm polyp was found in the transverse colon. The polyp was sessile.       The polyp was removed with a cold snare. Resection and retrieval were       complete.      Multiple small and large-mouthed diverticula were found in the sigmoid       colon and descending colon.      The exam was otherwise normal throughout the examined colon.      The digital rectal exam was normal. Impression:           - One 3 mm polyp in the rectum, removed with a cold                        biopsy forceps. Resected and retrieved.                       - One 6 mm polyp in the transverse colon, removed with                        a cold snare. Resected and retrieved.                       - Diverticulosis in the sigmoid colon and in the                        descending colon. Recommendation:       - Discharge patient to home.                       - Soft diet today, then advance as tolerated to advance                        diet as tolerated.                       - Await pathology results.                       - Telephone GI clinic for pathology results in 1 week. Procedure Code(s):    --- Professional ---                       [phone], Colonoscopy, flexible; with removal of tumor(s),                        polyp(s), or other lesion(s) by snare technique                       45380, 99, Colonoscopy, flexible; with biopsy, single                        or multiple Diagnosis Code(s):    --- Professional ---                       Z12.11, Encounter for screening for malignant neoplasm  of colon                       K62.1, Rectal polyp                       D12.3, Benign neoplasm of transverse colon (hepatic                        flexure or splenic flexure)                       K57.30, Diverticulosis of large intestine without                        perforation or abscess without bleeding CPT copyright 2018 American Medical Association. All rights reserved. The codes documented in  this report are preliminary and upon coder review may  be revised to meet current compliance requirements. Lollie Sails, MD 04/29/2018 4:17:09 PM This report has been signed electronically. Number of Addenda: 0 Note Initiated On: 04/29/2018 3:01 PM Scope Withdrawal Time: 0 hours 6 minutes 12 seconds  Total Procedure Duration: 0 hours 50 minutes 49 seconds       [facility]

## 2018-04-29 NOTE — Anesthesia Procedure Notes (Signed)
Date/Time: 04/29/2018 3:24 PM Performed by: Nelda Marseille, CRNA Pre-anesthesia Checklist: Patient identified, Emergency Drugs available, Suction available, Patient being monitored and Timeout performed Oxygen Delivery Method: Nasal cannula

## 2018-04-29 NOTE — Transfer of Care (Signed)
Immediate Anesthesia Transfer of Care Note  Patient: Lindsey Wolf  Procedure(s) Performed: COLONOSCOPY WITH PROPOFOL (N/A )  Patient Location: PACU  Anesthesia Type:General  Level of Consciousness: awake, alert  and oriented  Airway & Oxygen Therapy: Patient Spontanous Breathing and Patient connected to nasal cannula oxygen  Post-op Assessment: Report given to RN and Post -op Vital signs reviewed and stable  Post vital signs: Reviewed and stable  Last Vitals:  Vitals Value Taken Time  BP    Temp    Pulse 78 04/29/2018  4:18 PM  Resp 16 04/29/2018  4:17 PM  SpO2 100 % 04/29/2018  4:18 PM  Vitals shown include unvalidated device data.  Last Pain:  Vitals:   04/29/18 1459  TempSrc: Tympanic      Patients Stated Pain Goal: 0 (84/66/59 9357)  Complications: No apparent anesthesia complications

## 2018-04-29 NOTE — Anesthesia Preprocedure Evaluation (Signed)
Anesthesia Evaluation  Patient identified by MRN, date of birth, ID band Patient awake    Reviewed: Allergy & Precautions, NPO status , Patient's Chart, lab work & pertinent test results  History of Anesthesia Complications Negative for: history of anesthetic complications  Airway Mallampati: I       Dental  (+) Teeth Intact, Dental Advidsory Given   Pulmonary neg shortness of breath, COPD, neg recent URI, former smoker,     (-) wheezing  rales (few scattered crackles, adequate air entry)    Cardiovascular Exercise Tolerance: Good negative cardio ROS   Rhythm:Regular Rate:Normal     Neuro/Psych  Headaches, PSYCHIATRIC DISORDERS Anxiety Depression    GI/Hepatic Neg liver ROS, PUD, GERD  Medicated,  Endo/Other  negative endocrine ROS  Renal/GU negative Renal ROS  negative genitourinary   Musculoskeletal   Abdominal Normal abdominal exam  (+)   Peds negative pediatric ROS (+)  Hematology negative hematology ROS (+)   Anesthesia Other Findings Past Medical History: No date: Abnormal respirations No date: Abscess of abdominal cavity ([facility]) No date: Anxiety No date: Depression No date: Empyema of left pleural space ([facility]) No date: GERD (gastroesophageal reflux disease) No date: Hypokalemia No date: Perforated ulcer ([facility]) No date: Pleural effusion 08/17/2016: Protein-calorie malnutrition, severe No date: Respiratory distress   Reproductive/Obstetrics negative OB ROS                             Anesthesia Physical  Anesthesia Plan  ASA: II  Anesthesia Plan: General   Post-op Pain Management:    Induction: Intravenous  PONV Risk Score and Plan: TIVA and Propofol infusion  Airway Management Planned: Nasal Cannula  Additional Equipment:   Intra-op Plan:   Post-operative Plan:   Informed Consent: I have reviewed the patients History and Physical, chart, labs and discussed the  procedure including the risks, benefits and alternatives for the proposed anesthesia with the patient or authorized representative who has indicated his/her understanding and acceptance.     Plan Discussed with: Surgeon, Anesthesiologist and CRNA  Anesthesia Plan Comments:         Anesthesia Quick Evaluation

## 2018-04-29 NOTE — Anesthesia Post-op Follow-up Note (Signed)
Anesthesia QCDR form completed.        

## 2018-04-30 ENCOUNTER — Encounter: Payer: BLUE CROSS/BLUE SHIELD | Admitting: Physical Therapy

## 2018-04-30 ENCOUNTER — Encounter: Payer: Self-pay | Admitting: Gastroenterology

## 2018-04-30 NOTE — Anesthesia Postprocedure Evaluation (Signed)
Anesthesia Post Note  Patient: Lindsey Wolf Mis  Procedure(s) Performed: COLONOSCOPY WITH PROPOFOL (N/A )  Patient location during evaluation: PACU Anesthesia Type: General Level of consciousness: awake and alert and oriented Pain management: pain level controlled Vital Signs Assessment: post-procedure vital signs reviewed and stable Respiratory status: spontaneous breathing Cardiovascular status: blood pressure returned to baseline Anesthetic complications: no     Last Vitals:  Vitals:   04/29/18 1459 04/29/18 1619  BP: 140/82 96/74  Pulse: 74   Resp: 16   Temp: 36.6 C (!) 35.6 C  SpO2: 100%     Last Pain:  Vitals:   04/29/18 1629  TempSrc:   PainSc: 0-No pain                 Astor Gentle

## 2018-05-02 LAB — SURGICAL PATHOLOGY

## 2018-05-20 IMAGING — RF DG UGI W/ GASTROGRAFIN
10 series · 10 of 10 positions shown · IV contrast (iopamidol)
Comparison: KUB 07/21/2016.

CLINICAL DATA: Perforated ulcers.

EXAM:
WATER SOLUBLE UPPER GI SERIES
TECHNIQUE: Single-column upper GI series was performed using water soluble
contrast.
CONTRAST:  150mL [id number] IOPAMIDOL ([id number]) INJECTION 61%

[Series 1: fluoro_barium 2fps_bw · 0.17mm/px · 1 of 1 slices shown (1 of 10)]
[im 1/1]
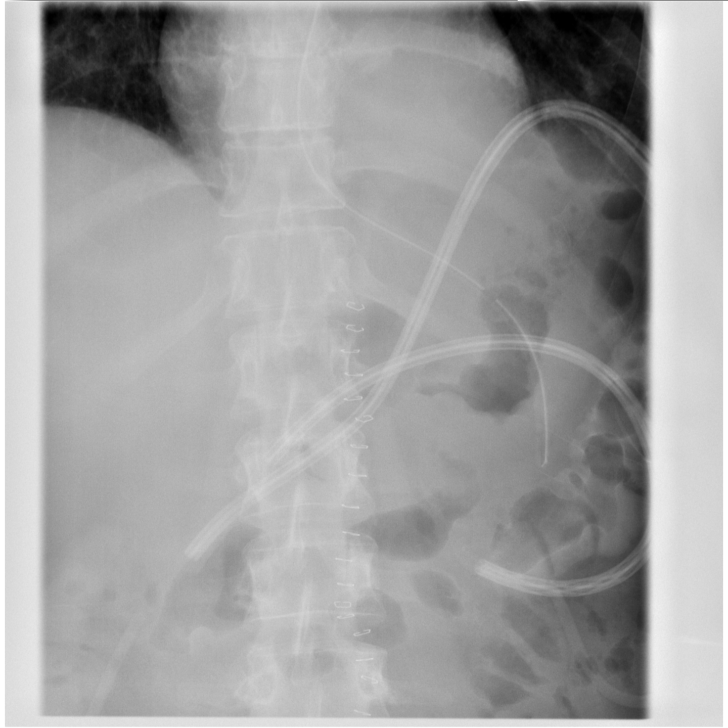

[Series 2: fluoro_barium 2fps_bw · 0.17mm/px · 1 of 1 slices shown (2 of 10)]
[im 1/1]
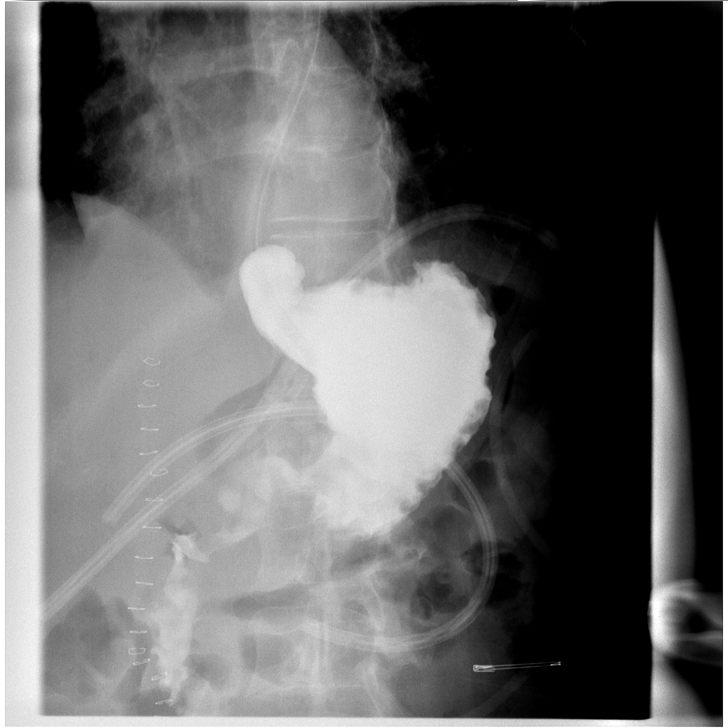

[Series 3: fluoro_barium 2fps_bw · 0.17mm/px · 1 of 1 slices shown (3 of 10)]
[im 1/1]
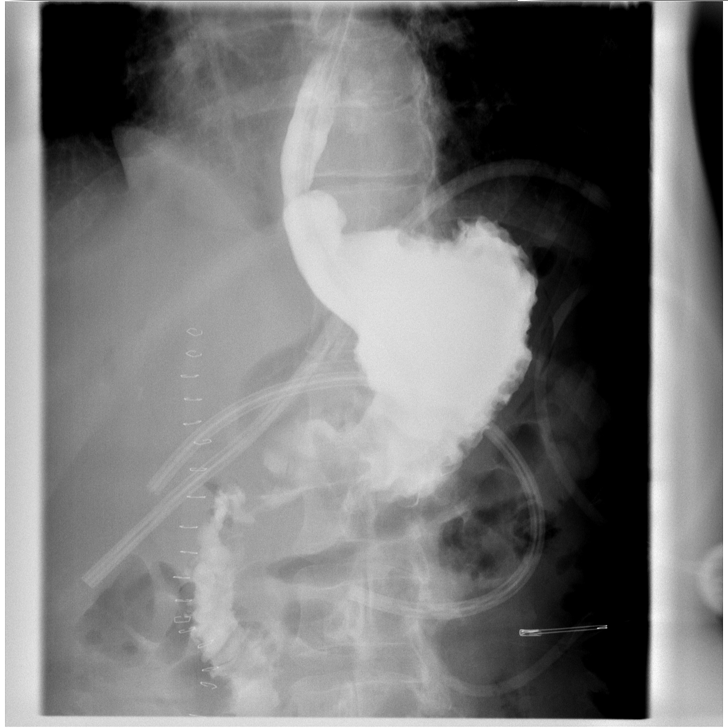

[Series 4: fluoro_barium 2fps_bw · 0.17mm/px · 1 of 1 slices shown (4 of 10)]
[im 1/1]
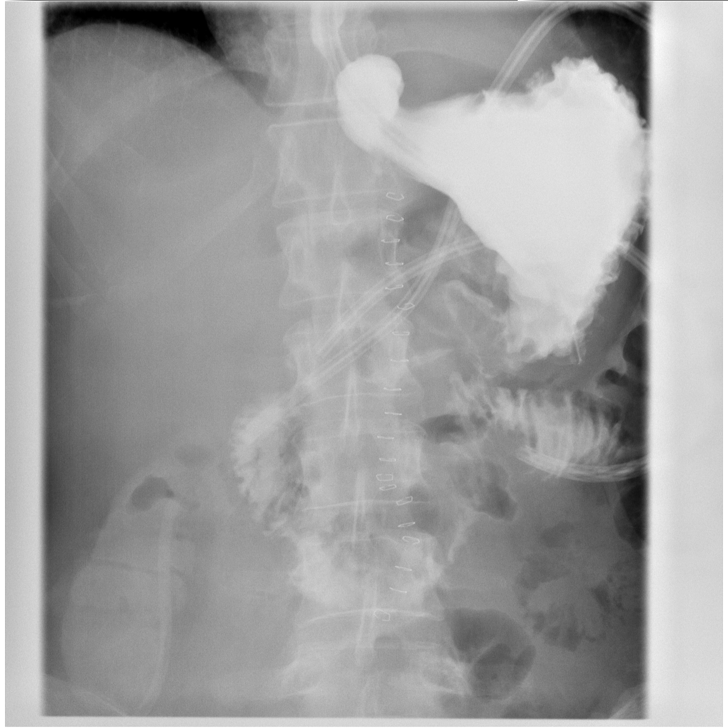

[Series 5: fluoro_barium 2fps_bw · 0.17mm/px · 1 of 1 slices shown (5 of 10)]
[im 1/1]
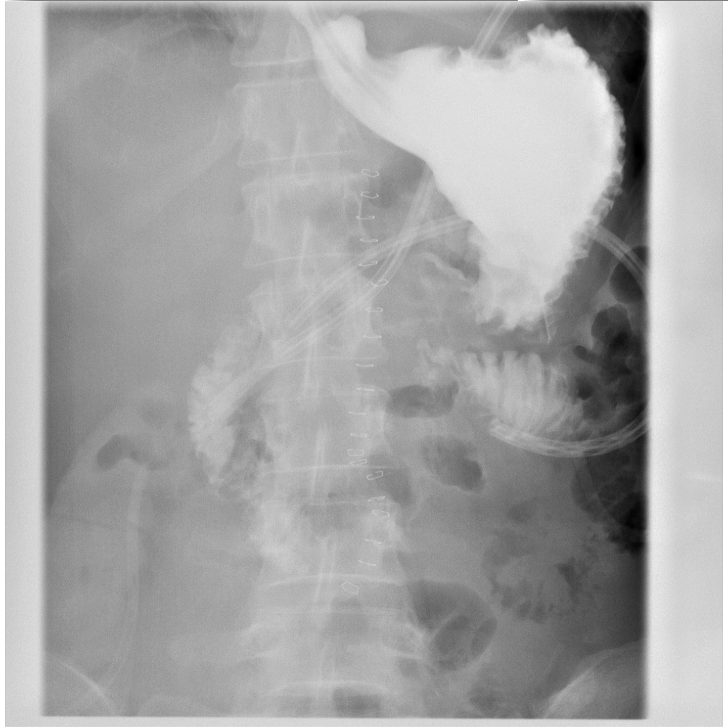

[Series 6: fluoro_barium 2fps_bw · 0.17mm/px · 1 of 1 slices shown (6 of 10)]
[im 1/1]
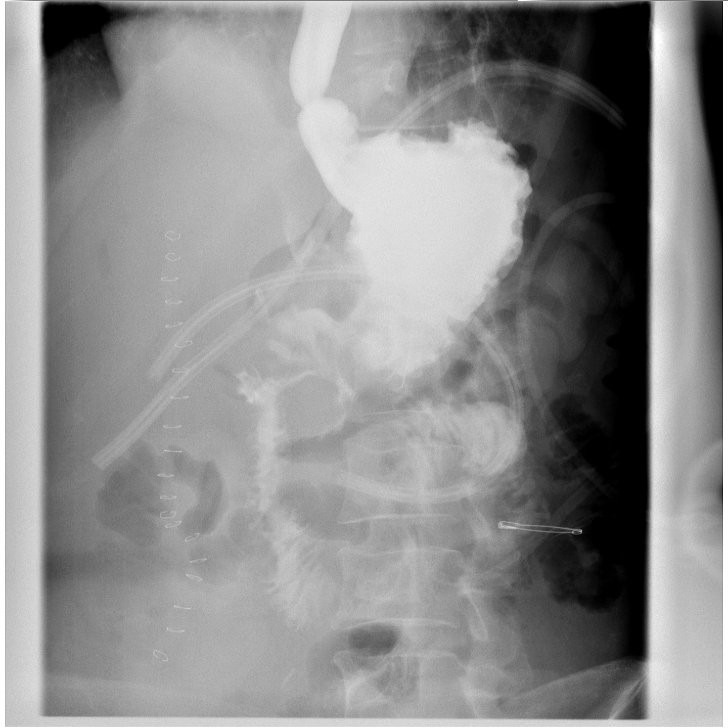

[Series 7: fluoro_barium 2fps_bw · 0.17mm/px · 1 of 1 slices shown (7 of 10)]
[im 1/1]
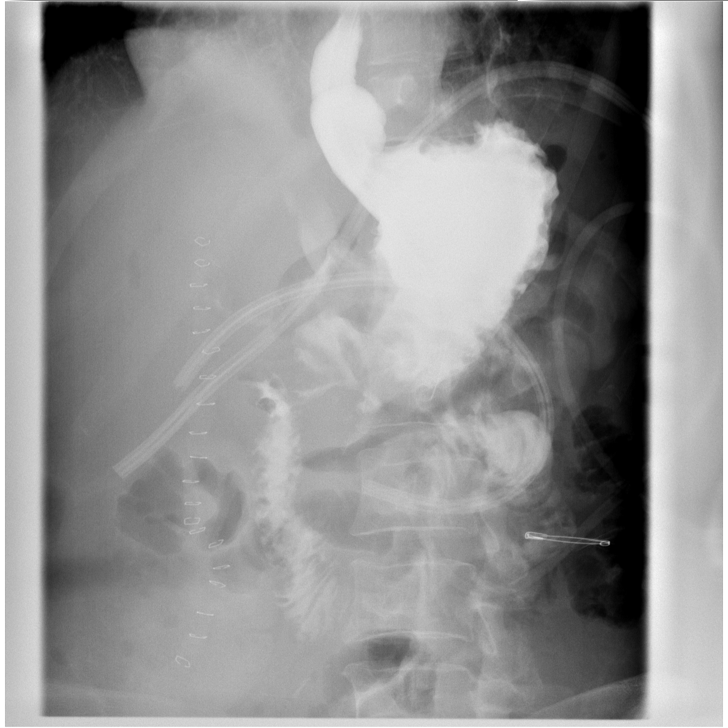

[Series 8: fluoro_barium 2fps_bw · 0.17mm/px · 1 of 1 slices shown (8 of 10)]
[im 1/1]
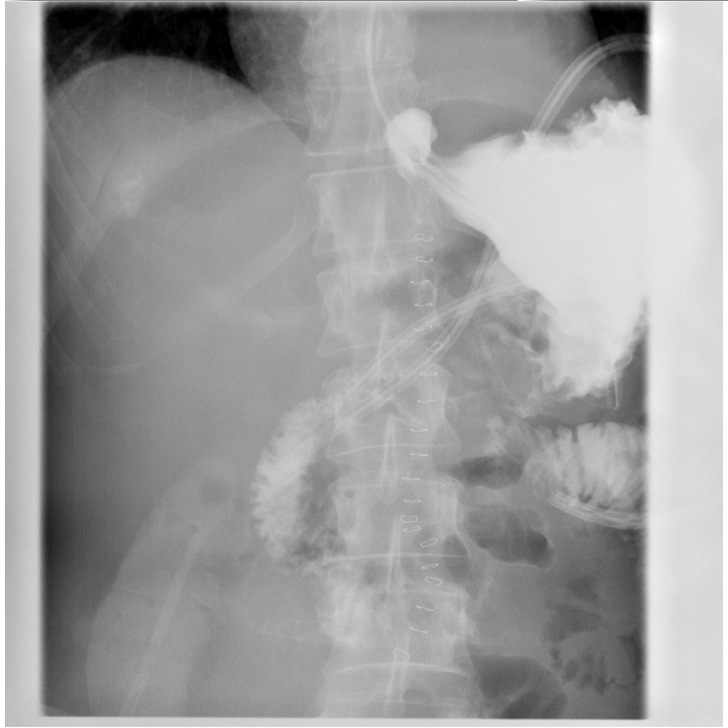

[Series 9: fluoro_barium 2fps_bw · 0.17mm/px · 1 of 1 slices shown (9 of 10)]
[im 1/1]
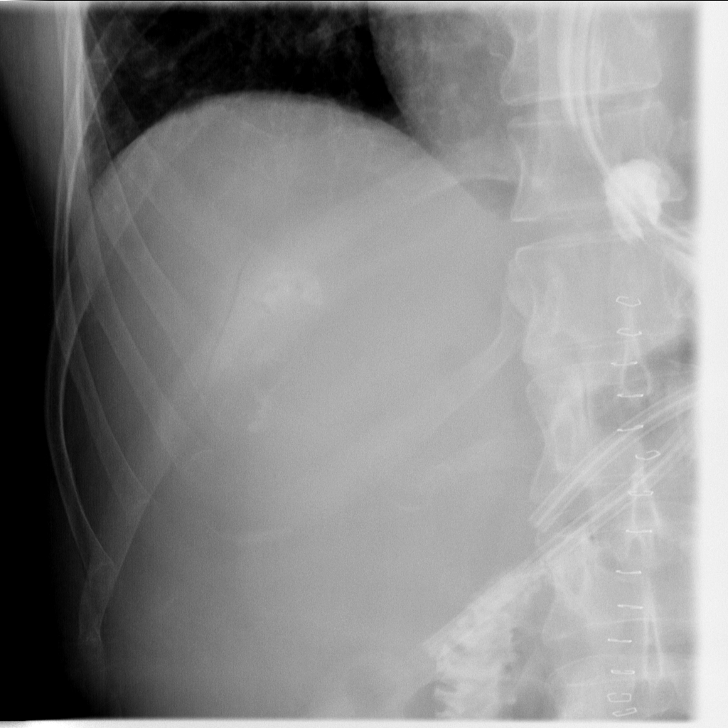

[Series 10: fluoro_barium 2fps_bw · 0.17mm/px · 1 of 1 slices shown (10 of 10)]
[im 1/1]
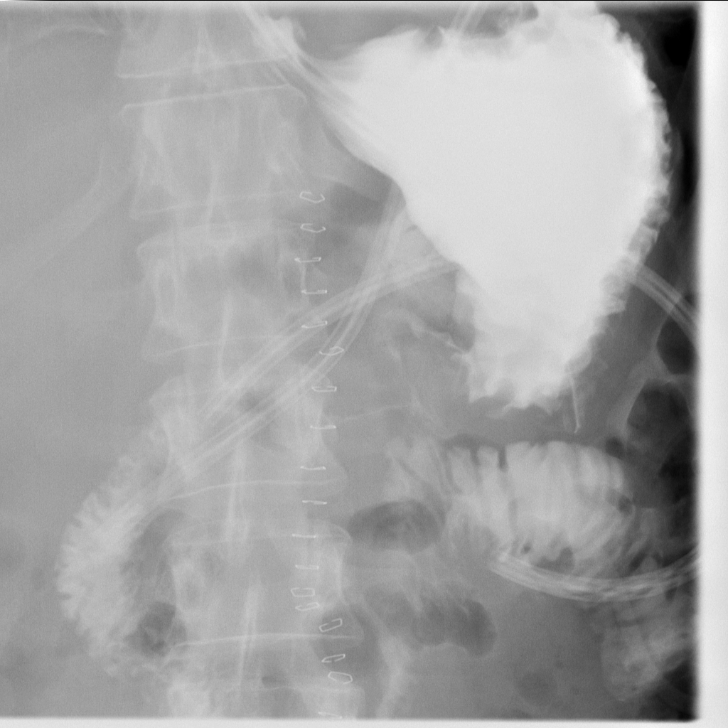

[10 of 10 positions shown; findings below may reference images not displayed]

CT 07/20/2016.

FLUOROSCOPY TIME:  Fluoroscopy Time:  1 minutes 12 seconds.

Radiation Exposure Index (if provided by the fluoroscopic device):
24.2 mGy

Number of Acquired Spot Images: 10
FINDINGS: NG tube noted good position. Standard were soluble upper GI
performed through the NG tube. Dilute Osovue-YOO was utilized. Exam
was limited as the patient would not stay on the table but for a
limited time. Thickened antral and duodenal folds are noted.
Contrast extravasation is noted arising from the region of the
antrum and duodenum. Multiple sites of perforation cannot be
excluded. Prominent gastroesophageal reflux.
IMPRESSION: 1. Limited exam. Contrast extravasation is noted arising from the
region of the antrum and duodenum. Multiple sites of perforation
cannot be excluded .

2. NG tube noted in good anatomic position. Prominent
gastroesophageal reflux.

## 2018-09-18 DIAGNOSIS — F3341 Major depressive disorder, recurrent, in partial remission: Secondary | ICD-10-CM | POA: Insufficient documentation

## 2018-09-18 DIAGNOSIS — J3089 Other allergic rhinitis: Secondary | ICD-10-CM | POA: Insufficient documentation

## 2018-09-18 DIAGNOSIS — Z720 Tobacco use: Secondary | ICD-10-CM | POA: Insufficient documentation

## 2018-12-30 ENCOUNTER — Other Ambulatory Visit: Payer: Self-pay | Admitting: Obstetrics & Gynecology

## 2018-12-30 DIAGNOSIS — Z1231 Encounter for screening mammogram for malignant neoplasm of breast: Secondary | ICD-10-CM

## 2019-02-12 ENCOUNTER — Ambulatory Visit
Admission: RE | Admit: 2019-02-12 | Discharge: 2019-02-12 | Disposition: A | Discharge status: Home / Self Care | Payer: Medicaid Other | Source: Ambulatory Visit | Attending: Obstetrics & Gynecology | Admitting: Obstetrics & Gynecology

## 2019-02-12 DIAGNOSIS — Z1231 Encounter for screening mammogram for malignant neoplasm of breast: Secondary | ICD-10-CM | POA: Insufficient documentation

## 2020-03-28 ENCOUNTER — Other Ambulatory Visit: Payer: Self-pay | Admitting: Obstetrics & Gynecology

## 2020-03-28 DIAGNOSIS — Z1231 Encounter for screening mammogram for malignant neoplasm of breast: Secondary | ICD-10-CM

## 2020-07-21 ENCOUNTER — Ambulatory Visit
Admission: RE | Admit: 2020-07-21 | Discharge: 2020-07-21 | Disposition: A | Discharge status: Home / Self Care | Payer: Medicare Other | Source: Ambulatory Visit | Attending: Obstetrics & Gynecology | Admitting: Obstetrics & Gynecology

## 2020-07-21 ENCOUNTER — Other Ambulatory Visit: Payer: Self-pay

## 2020-07-21 DIAGNOSIS — Z1231 Encounter for screening mammogram for malignant neoplasm of breast: Secondary | ICD-10-CM | POA: Diagnosis present

## 2020-07-27 ENCOUNTER — Other Ambulatory Visit: Payer: Self-pay | Admitting: Obstetrics & Gynecology

## 2020-07-27 DIAGNOSIS — R928 Other abnormal and inconclusive findings on diagnostic imaging of breast: Secondary | ICD-10-CM

## 2020-07-27 DIAGNOSIS — R921 Mammographic calcification found on diagnostic imaging of breast: Secondary | ICD-10-CM

## 2020-08-04 ENCOUNTER — Other Ambulatory Visit: Payer: Self-pay

## 2020-08-04 ENCOUNTER — Ambulatory Visit
Admission: RE | Admit: 2020-08-04 | Discharge: 2020-08-04 | Disposition: A | Discharge status: Home / Self Care | Payer: Medicaid Other | Source: Ambulatory Visit | Attending: Obstetrics & Gynecology | Admitting: Obstetrics & Gynecology

## 2020-08-04 DIAGNOSIS — R928 Other abnormal and inconclusive findings on diagnostic imaging of breast: Secondary | ICD-10-CM | POA: Diagnosis present

## 2020-08-04 DIAGNOSIS — R921 Mammographic calcification found on diagnostic imaging of breast: Secondary | ICD-10-CM | POA: Diagnosis present

## 2021-01-16 ENCOUNTER — Other Ambulatory Visit
Admission: RE | Admit: 2021-01-16 | Discharge: 2021-01-16 | Disposition: A | Discharge status: Home / Self Care | Payer: Medicare Other | Source: Ambulatory Visit | Attending: Pediatrics | Admitting: Pediatrics

## 2021-01-16 DIAGNOSIS — R0602 Shortness of breath: Secondary | ICD-10-CM | POA: Diagnosis present

## 2021-01-16 LAB — D-DIMER, QUANTITATIVE: D-Dimer, Quant: 0.8 ug/mL-FEU — ABNORMAL HIGH (ref 0.00–0.50)

## 2021-03-09 ENCOUNTER — Other Ambulatory Visit: Payer: Self-pay | Admitting: *Deleted

## 2021-03-09 DIAGNOSIS — F17210 Nicotine dependence, cigarettes, uncomplicated: Secondary | ICD-10-CM

## 2021-03-09 DIAGNOSIS — Z87891 Personal history of nicotine dependence: Secondary | ICD-10-CM

## 2021-03-22 ENCOUNTER — Encounter: Payer: Self-pay | Admitting: Acute Care

## 2021-03-22 ENCOUNTER — Other Ambulatory Visit: Payer: Self-pay

## 2021-03-22 ENCOUNTER — Telehealth (INDEPENDENT_AMBULATORY_CARE_PROVIDER_SITE_OTHER): Payer: Medicare Other | Admitting: Acute Care

## 2021-03-22 DIAGNOSIS — F17210 Nicotine dependence, cigarettes, uncomplicated: Secondary | ICD-10-CM

## 2021-03-22 NOTE — Progress Notes (Signed)
Virtual Visit via Video Note  I connected with Amena Dockham Doshier on 03/22/21 at 10:30 AM EDT by a video enabled telemedicine application and verified that I am speaking with the correct person using two identifiers.  Location: Patient: At home Provider: [place], [place], [place], [address]    I discussed the limitations of evaluation and management by telemedicine and the availability of in person appointments. The patient expressed understanding and agreed to proceed.  Shared Decision Making Visit Lung Cancer Screening Program [phone])   Eligibility: Age 55 y.o. Pack Years Smoking History Calculation 52 pack year smoking history (# packs/per year x # years smoked) Recent History of coughing up blood  no Unexplained weight loss? no ( >Than 15 pounds within the last 6 months ) Prior History Lung / other cancer no (Diagnosis within the last 5 years already requiring surveillance chest CT Scans). Smoking Status Current Smoker Former Smokers: Years since quit:  NA  Quit Date: NA  Visit Components: Discussion included one or more decision making aids. yes Discussion included risk/benefits of screening. yes Discussion included potential follow up diagnostic testing for abnormal scans. yes Discussion included meaning and risk of over diagnosis. yes Discussion included meaning and risk of False Positives. yes Discussion included meaning of total radiation exposure. yes  Counseling Included: Importance of adherence to annual lung cancer LDCT screening. yes Impact of comorbidities on ability to participate in the program. yes Ability and willingness to under diagnostic treatment. yes  Smoking Cessation Counseling: Current Smokers:  Discussed importance of smoking cessation. yes Information about tobacco cessation classes and interventions provided to patient. yes Patient provided with "ticket" for LDCT Scan. yes Symptomatic Patient. no  Counseling NA Diagnosis Code:  Tobacco Use Z72.0 Asymptomatic Patient yes  Counseling (Intermediate counseling: > three minutes counseling) Z3086 Former Smokers:  Discussed the importance of maintaining cigarette abstinence. yes Diagnosis Code: Personal History of Nicotine Dependence. V78.469 Information about tobacco cessation classes and interventions provided to patient. Yes Patient provided with "ticket" for LDCT Scan. yes Written Order for Lung Cancer Screening with LDCT placed in Epic. Yes (CT Chest Lung Cancer Screening Low Dose W/O CM) [id number] Z12.2-Screening of respiratory organs Z87.891-Personal history of nicotine dependence  I have spent 25 minutes of face to face time with Eliseo Gum Renken  discussing the risks and benefits of lung cancer screening. We viewed a power point together that explained in detail the above noted topics. We paused at intervals to allow for questions to be asked and answered to ensure understanding.We discussed that the single most powerful action that she can take to decrease her risk of developing lung cancer is to quit smoking. We discussed whether or not she is ready to commit to setting a quit date. We discussed options for tools to aid in quitting smoking including nicotine replacement therapy, non-nicotine medications, support groups, Quit Smart classes, and behavior modification. We discussed that often times setting smaller, more achievable goals, such as eliminating 1 cigarette a day for a week and then 2 cigarettes a day for a week can be helpful in slowly decreasing the number of cigarettes smoked. This allows for a sense of accomplishment as well as providing a clinical benefit. I gave her the " Be Stronger Than Your Excuses" card with contact information for community resources, classes, free nicotine replacement therapy, and access to mobile apps, text messaging, and on-line smoking cessation help. I have also given her my card and contact information in the event she needs  to  contact me. We discussed the time and location of the scan, and that either Doroteo Glassman RN or I will call with the results within 24-48 hours of receiving them. I have offered her  a copy of the power point we viewed  as a resource in the event they need reinforcement of the concepts we discussed today in the office. The patient verbalized understanding of all of  the above and had no further questions upon leaving the office. They have my contact information in the event they have any further questions.  I spent 3 minutes counseling on smoking cessation and the health risks of continued tobacco abuse.  I explained to the patient that there has been a high incidence of coronary artery disease noted on these exams. I explained that this is a non-gated exam therefore degree or severity cannot be determined. This patient is on statin therapy. I have asked the patient to follow-up with their PCP regarding any incidental finding of coronary artery disease and management with diet or medication as their PCP  feels is clinically indicated. The patient verbalized understanding of the above and had no further questions upon completion of the visit.      Magdalen Spatz, NP 03/22/2021

## 2021-03-22 NOTE — Patient Instructions (Signed)
Thank you for participating in the [facility]. It was our pleasure to meet you today. We will call you with the results of your scan within the next few days. Your scan will be assigned a Lung RADS category score by the physicians reading the scans.  This Lung RADS score determines follow up scanning.  See below for description of categories, and follow up screening recommendations. We will be in touch to schedule your follow up screening annually or based on recommendations of our providers. We will fax a copy of your scan results to your Primary Care Physician, or the physician who referred you to the program, to ensure they have the results. Please call the office if you have any questions or concerns regarding your scanning experience or results.  Our office number is [phone]. Please speak with Denise Phelps, RN. She is our Lung Cancer Screening RN. If she is unavailable when you call, please have the office staff send her a message. She will return your call at her earliest convenience. Remember, if your scan is normal, we will scan you annually as long as you continue to meet the criteria for the program. (Age 55-77, Current smoker or smoker who has quit within the last 15 years). If you are a smoker, remember, quitting is the single most powerful action that you can take to decrease your risk of lung cancer and other pulmonary, breathing related problems. We know quitting is hard, and we are here to help.  Please let us know if there is anything we can do to help you meet your goal of quitting. If you are a former smoker, congratulations. We are proud of you! Remain smoke free! Remember you can refer friends or family members through the number above.  We will screen them to make sure they meet criteria for the program. Thank you for helping us take better care of you by participating in Lung Screening.  Lung RADS Categories:  Lung RADS 1: no nodules  or definitely non-concerning nodules.  Recommendation is for a repeat annual scan in 12 months.  Lung RADS 2:  nodules that are non-concerning in appearance and behavior with a very low likelihood of becoming an active cancer. Recommendation is for a repeat annual scan in 12 months.  Lung RADS 3: nodules that are probably non-concerning , includes nodules with a low likelihood of becoming an active cancer.  Recommendation is for a 6-month repeat screening scan. Often noted after an upper respiratory illness. We will be in touch to make sure you have no questions, and to schedule your 6-month scan.  Lung RADS 4 A: nodules with concerning findings, recommendation is most often for a follow up scan in 3 months or additional testing based on our provider's assessment of the scan. We will be in touch to make sure you have no questions and to schedule the recommended 3 month follow up scan.  Lung RADS 4 B:  indicates findings that are concerning. We will be in touch with you to schedule additional diagnostic testing based on our provider's  assessment of the scan.   

## 2021-03-24 ENCOUNTER — Ambulatory Visit: Payer: Medicare Other | Attending: Acute Care

## 2021-04-04 ENCOUNTER — Ambulatory Visit
Admission: RE | Admit: 2021-04-04 | Discharge: 2021-04-04 | Disposition: A | Discharge status: Home / Self Care | Payer: Medicare Other | Source: Ambulatory Visit | Attending: Acute Care | Admitting: Acute Care

## 2021-04-04 ENCOUNTER — Other Ambulatory Visit: Payer: Self-pay

## 2021-04-04 DIAGNOSIS — F17210 Nicotine dependence, cigarettes, uncomplicated: Secondary | ICD-10-CM | POA: Diagnosis not present

## 2021-04-04 DIAGNOSIS — Z87891 Personal history of nicotine dependence: Secondary | ICD-10-CM | POA: Diagnosis present

## 2021-04-12 ENCOUNTER — Other Ambulatory Visit: Payer: Self-pay | Admitting: Acute Care

## 2021-04-12 DIAGNOSIS — F17210 Nicotine dependence, cigarettes, uncomplicated: Secondary | ICD-10-CM

## 2021-07-11 ENCOUNTER — Other Ambulatory Visit: Payer: Self-pay | Admitting: Internal Medicine

## 2021-07-11 DIAGNOSIS — Z1231 Encounter for screening mammogram for malignant neoplasm of breast: Secondary | ICD-10-CM

## 2021-08-17 ENCOUNTER — Other Ambulatory Visit: Payer: Self-pay

## 2021-08-17 ENCOUNTER — Ambulatory Visit
Admission: RE | Admit: 2021-08-17 | Discharge: 2021-08-17 | Disposition: A | Discharge status: Home / Self Care | Payer: Medicare Other | Source: Ambulatory Visit | Attending: Internal Medicine | Admitting: Internal Medicine

## 2021-08-17 DIAGNOSIS — Z1231 Encounter for screening mammogram for malignant neoplasm of breast: Secondary | ICD-10-CM | POA: Insufficient documentation

## 2021-12-06 ENCOUNTER — Ambulatory Visit (INDEPENDENT_AMBULATORY_CARE_PROVIDER_SITE_OTHER): Payer: Medicare HMO | Admitting: Podiatry

## 2021-12-06 ENCOUNTER — Encounter: Payer: Self-pay | Admitting: Podiatry

## 2021-12-06 ENCOUNTER — Ambulatory Visit: Payer: Medicare HMO

## 2021-12-06 DIAGNOSIS — D2372 Other benign neoplasm of skin of left lower limb, including hip: Secondary | ICD-10-CM

## 2021-12-06 DIAGNOSIS — Q666 Other congenital valgus deformities of feet: Secondary | ICD-10-CM

## 2021-12-06 DIAGNOSIS — M778 Other enthesopathies, not elsewhere classified: Secondary | ICD-10-CM

## 2021-12-06 NOTE — Progress Notes (Signed)
Subjective:  Patient ID: Lindsey Wolf, female    DOB: 30-Mar-1966,  MRN: [id number] HPI Chief Complaint  Patient presents with   Foot Pain    Medial foot/ankle left - severe pronation x years, had several braces, [facility] doc suggested surgery - 2nd opinion, has been casted for Richie brace (last week), Dr. Luana Shu has been prescribing prednisone, said injections were not good   New Patient (Initial Visit)    56 y.o. female presents with the above complaint.   ROS: Denies fever chills nausea vomiting muscle aches pains calf pain back pain chest pain shortness of breath.  Past Medical History:  Diagnosis Date   Abnormal respirations    Abscess of abdominal cavity (HCC)    Anxiety    Depression    Empyema of left pleural space (HCC)    GERD (gastroesophageal reflux disease)    Headache    Hypokalemia    Perforated ulcer (HCC)    Pleural effusion    Protein-calorie malnutrition, severe 08/17/2016   Respiratory distress    Past Surgical History:  Procedure Laterality Date   ABDOMINAL WALL DEFECT REPAIR N/A 11/21/2017   Procedure: REPAIR ABDOMINAL WALL, ABDOMINAL WALL RECONSTRUCTION with mesh;  Surgeon: Jules Husbands, MD;  Location: [facility] ORS;  Service: General;  Laterality: N/A;   APPLICATION OF WOUND VAC N/A 08/12/2016   Procedure: ,remove 2  JP drains,add one pennrose;  Surgeon: Florene Glen, MD;  Location: [facility] ORS;  Service: General;  Laterality: N/A;   CHEST TUBE INSERTION Right 08/08/2016   Procedure: CHEST TUBE INSERTION;  Surgeon: [name], MD;  Location: [facility] ORS;  Service: General;  Laterality: Right;   COLONOSCOPY WITH PROPOFOL N/A 04/29/2018   Procedure: COLONOSCOPY WITH PROPOFOL;  Surgeon: Lollie Sails, MD;  Location[facility] ENDOSCOPY;  Service: Endoscopy;  Laterality: N/A;   DEBRIDEMENT OF ABDOMINAL WALL ABSCESS N/A 08/08/2016   Procedure: DEBRIDEMENT OF ABDOMINAL WALL ABSCESS;  Surgeon: Florene Glen, MD;  Location: [facility] ORS;  Service: General;   Laterality: N/A;   DIAGNOSTIC LAPAROSCOPY     ESOPHAGOGASTRODUODENOSCOPY (EGD) WITH PROPOFOL N/A 02/19/2017   Procedure: ESOPHAGOGASTRODUODENOSCOPY (EGD) WITH PROPOFOL;  Surgeon: Jonathon Bellows, MD;  Location: [facility] ENDOSCOPY;  Service: Gastroenterology;  Laterality: N/A;   ESOPHAGOGASTRODUODENOSCOPY (EGD) WITH PROPOFOL N/A 01/20/2018   Procedure: ESOPHAGOGASTRODUODENOSCOPY (EGD) WITH PROPOFOL;  Surgeon: Lollie Sails, MD;  Location: [facility] ENDOSCOPY;  Service: Endoscopy;  Laterality: N/A;   LAPAROTOMY N/A 07/20/2016   Procedure: EXPLORATORY LAPAROTOMY;  Surgeon: Jules Husbands, MD;  Location: [facility] ORS;  Service: General;  Laterality: N/A;   LAPAROTOMY N/A 07/30/2016   Procedure: EXPLORATORY LAPAROTOMY drainage peritoneal abscess;  Surgeon: Clayburn Pert, MD;  Location: [facility] ORS;  Service: General;  Laterality: N/A;   LYSIS OF ADHESION  07/30/2016   Procedure: LYSIS OF ADHESION;  Surgeon: Clayburn Pert, MD;  Location: [facility] ORS;  Service: General;;   TONSILLECTOMY     VIDEO ASSISTED THORACOSCOPY (VATS)/THOROCOTOMY Left 08/08/2016   Procedure: VIDEO ASSISTED THORACOSCOPY (VATS)/THOROCOTOMY Possible Thoracotomy;  Surgeon: [name], MD;  Location: [facility] ORS;  Service: General;  Laterality: Left;    Current Outpatient Medications:    varenicline (CHANTIX) 0.5 MG tablet, Take 0.5 mg by mouth 2 (two) times daily., Disp: , Rfl:    albuterol (PROVENTIL HFA;VENTOLIN HFA) 108 (90 Base) MCG/ACT inhaler, Inhale 2 puffs into the lungs every 6 (six) hours as needed for wheezing or shortness of breath., Disp: , Rfl:    ARIPiprazole (ABILIFY) 5 MG tablet, Take 5 mg  by mouth at bedtime., Disp: , Rfl:    clonazePAM (KLONOPIN) 1 MG tablet, Take by mouth., Disp: , Rfl:    pantoprazole (PROTONIX) 40 MG tablet, TAKE 1 TABLET BY MOUTH TWICE DAILY (Patient taking differently: Take 40 mg by mouth twice daily as needed for acid reflux), Disp: 60 tablet, Rfl: 1   rosuvastatin (CRESTOR) 5 MG tablet, Take 5 mg by mouth  daily., Disp: , Rfl:    topiramate (TOPAMAX) 100 MG tablet, , Disp: , Rfl:    traZODone (DESYREL) 50 MG tablet, Take 25-100 mg by mouth at bedtime., Disp: , Rfl:    TRINTELLIX 20 MG TABS tablet, Take 20 mg by mouth daily., Disp: , Rfl:   No Known Allergies Review of Systems Objective:  There were no vitals filed for this visit.  General: Well developed, nourished, in no acute distress, alert and oriented x3   Dermatological: Skin is warm, dry and supple bilateral. Nails x 10 are well maintained; remaining integument appears unremarkable at this time. There are no open sores, no preulcerative lesions, no rash or signs of infection present.  Vascular: Dorsalis Pedis artery and Posterior Tibial artery pedal pulses are 2/4 bilateral with immedate capillary fill time. Pedal hair growth present. No varicosities and no lower extremity edema present bilateral.   Neruologic: Grossly intact via light touch bilateral. Vibratory intact via tuning fork bilateral. Protective threshold with Semmes Wienstein monofilament intact to all pedal sites bilateral. Patellar and Achilles deep tendon reflexes 2+ bilateral. No Babinski or clonus noted bilateral.   Musculoskeletal: No gross boney pedal deformities bilateral. No pain, crepitus, or limitation noted with foot and ankle range of motion bilateral. Muscular strength 5/5 in all groups tested bilateral.  Left foot does demonstrate severe planus planovalgus.  Upon standing her calcaneus everts significantly.  Her feet rearfoot seems to be very flexible however I am not sure her atypical flatfoot repair would work on this patient.  Radiographs:  Radiographs reviewed today demonstrating mild pes planovalgus cannot rule out fibrous coalition calcaneonavicular.  No significant osteoarthritic changes.  Assessment & Plan:   Assessment: Pes planovalgus  Plan: I will send her for CT rearfoot evaluate for coalitions.  I would like for her to follow-up with Dr.  Sherryle Lis I feel that it is possible that triple arthrodesis may benefit her more than a typical flatfoot repair     Lindsey Wolf, [place]

## 2021-12-13 ENCOUNTER — Ambulatory Visit
Admission: RE | Admit: 2021-12-13 | Discharge: 2021-12-13 | Disposition: A | Discharge status: Home / Self Care | Payer: Medicare HMO | Source: Ambulatory Visit | Attending: Podiatry | Admitting: Podiatry

## 2021-12-13 DIAGNOSIS — M79672 Pain in left foot: Secondary | ICD-10-CM | POA: Diagnosis not present

## 2021-12-13 DIAGNOSIS — Q666 Other congenital valgus deformities of feet: Secondary | ICD-10-CM

## 2021-12-25 ENCOUNTER — Ambulatory Visit: Payer: Medicare HMO | Admitting: Podiatry

## 2021-12-26 DIAGNOSIS — D485 Neoplasm of uncertain behavior of skin: Secondary | ICD-10-CM | POA: Diagnosis not present

## 2021-12-26 DIAGNOSIS — L814 Other melanin hyperpigmentation: Secondary | ICD-10-CM | POA: Diagnosis not present

## 2021-12-26 DIAGNOSIS — L578 Other skin changes due to chronic exposure to nonionizing radiation: Secondary | ICD-10-CM | POA: Diagnosis not present

## 2021-12-26 DIAGNOSIS — L910 Hypertrophic scar: Secondary | ICD-10-CM | POA: Diagnosis not present

## 2021-12-26 DIAGNOSIS — D225 Melanocytic nevi of trunk: Secondary | ICD-10-CM | POA: Diagnosis not present

## 2021-12-27 ENCOUNTER — Ambulatory Visit (INDEPENDENT_AMBULATORY_CARE_PROVIDER_SITE_OTHER): Payer: Medicare HMO | Admitting: Podiatry

## 2021-12-27 ENCOUNTER — Ambulatory Visit (INDEPENDENT_AMBULATORY_CARE_PROVIDER_SITE_OTHER): Payer: Medicare HMO

## 2021-12-27 DIAGNOSIS — S93692A Other sprain of left foot, initial encounter: Secondary | ICD-10-CM | POA: Diagnosis not present

## 2021-12-27 DIAGNOSIS — Q666 Other congenital valgus deformities of feet: Secondary | ICD-10-CM | POA: Diagnosis not present

## 2021-12-27 DIAGNOSIS — M76822 Posterior tibial tendinitis, left leg: Secondary | ICD-10-CM | POA: Diagnosis not present

## 2021-12-27 DIAGNOSIS — M66872 Spontaneous rupture of other tendons, left ankle and foot: Secondary | ICD-10-CM | POA: Diagnosis not present

## 2021-12-27 NOTE — Progress Notes (Signed)
  Subjective:  Patient ID: Lindsey Wolf, female    DOB: Aug 01, 1965,  MRN: [id number]  Chief Complaint  Patient presents with   Flat Foot     ct scan results/ ref by Dr Milinda Pointer    56 y.o. female presents with the above complaint. History confirmed with patient.  She is referred to me by Dr. Milinda Pointer for evaluation of adult flatfoot disease.  She was previously under the care of Dr. Luana Shu as well she has been fitted for a Richie brace last month but has not received it yet.  The pain is on the inside of the ankle pulls up from the arch.  Denies any specific trauma.  She recently completed a CT scan.  Anti-inflammatories overall have not been helpful in eliminating this for her.  She is limiting how much she can take these due to her gastric reflux disease  Objective:  Physical Exam: warm, good capillary refill, no trophic changes or ulcerative lesions, normal DP and PT pulses, normal sensory exam, and she has pain on palpation along the posterior tibial tendon primarily in the retromalleolar groove, distally into the mid arch and with resisted inversion.   Radiographs: Multiple views x-ray of the left foot: New foot films taken today show pes planus deformity with collapse of the medial arch,  elevation of the first ray, she has valgus of the heel noted as well  CT scan 12/14/2021 IMPRESSION: 1. 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 2. Pes planovalgus. 3. Mild talonavicular joint osteoarthritis. Assessment:   1. Pes planovalgus   2. Posterior tibial tendon dysfunction (PTTD) of left lower extremity   3. Spring ligament tear, left, initial encounter   4. Tibialis posterior tendon tear, nontraumatic, left      Plan:  Patient was evaluated and treated and all questions answered.  We discussed the nature and progression of adult acquired flatfoot disease.  We discussed both operative and nonoperative treatment.  She has nearly fully maximized her nonoperative  treatment.  She has not done formal physical therapy which I recommended.  A referral was sent for this.  Hopefully she is able to obtain the Richie brace in the next 1 to 2 weeks and begin wearing as I do think this may offer some relief but I am not sure we will be complete long-term solution for her.  I did review her CT scan results with her as well as her today's radiographs, she has mild TN arthritis but the subtalar joint and calcaneocuboid joint are largely spared.  Thickening of the posterior tibial tendon is noted on her CT scan but with a CT is offer suboptimal characterization of the soft tissues of the posterior tibial tendon as well as the spring ligament which I expect likely will need surgical reconstruction.  Surgical we discussed a medial calcaneal slide osteotomy, cotton osteotomy, FDL transfer and posterior tibial tendon repair, as possible spring ligament repair and augmentation.  I recommended MRI for surgical planning and this was ordered.  I will see her back in 4 weeks for reevaluation.  If she has no improvement with therapy or the brace then we will plan for surgery.  Return in about 4 weeks (around 01/24/2022).

## 2021-12-27 NOTE — Patient Instructions (Signed)
Call [facility] to schedule your MRI at the below locations.  Please allow at least 1 business day after your visit to process the referral.  It may take longer depending on approval from insurance.  Please let me know if you have issues or problems scheduling the MRI   [facility] [phone] [address] , [place] [address]    

## 2022-01-02 DIAGNOSIS — M76822 Posterior tibial tendinitis, left leg: Secondary | ICD-10-CM | POA: Diagnosis not present

## 2022-01-05 ENCOUNTER — Telehealth: Payer: Self-pay | Admitting: *Deleted

## 2022-01-05 DIAGNOSIS — M76822 Posterior tibial tendinitis, left leg: Secondary | ICD-10-CM

## 2022-01-05 NOTE — Telephone Encounter (Signed)
Patient calling for status of MRI ordered. Patient has been scheduled 08/09,[facility], [place].

## 2022-01-07 ENCOUNTER — Other Ambulatory Visit: Payer: Medicare HMO

## 2022-01-09 NOTE — Telephone Encounter (Signed)
Patient called and stated that she needed a referral to PT. She also is unable to wear her Angela Cox bracer and will be going back to [facility] to get it adjusted.

## 2022-01-09 NOTE — Addendum Note (Signed)
Addended bySherryle Lis, Jekhi Bolin R on: 01/09/2022 10:55 AM   Modules accepted: Orders

## 2022-01-10 ENCOUNTER — Ambulatory Visit
Admission: RE | Admit: 2022-01-10 | Discharge: 2022-01-10 | Disposition: A | Discharge status: Home / Self Care | Payer: Medicare HMO | Source: Ambulatory Visit | Attending: Podiatry | Admitting: Podiatry

## 2022-01-10 DIAGNOSIS — M66872 Spontaneous rupture of other tendons, left ankle and foot: Secondary | ICD-10-CM

## 2022-01-10 DIAGNOSIS — S93692A Other sprain of left foot, initial encounter: Secondary | ICD-10-CM

## 2022-01-10 DIAGNOSIS — M65872 Other synovitis and tenosynovitis, left ankle and foot: Secondary | ICD-10-CM | POA: Diagnosis not present

## 2022-01-10 DIAGNOSIS — Q666 Other congenital valgus deformities of feet: Secondary | ICD-10-CM | POA: Diagnosis not present

## 2022-01-10 DIAGNOSIS — M7732 Calcaneal spur, left foot: Secondary | ICD-10-CM | POA: Diagnosis not present

## 2022-01-10 DIAGNOSIS — S86312A Strain of muscle(s) and tendon(s) of peroneal muscle group at lower leg level, left leg, initial encounter: Secondary | ICD-10-CM | POA: Diagnosis not present

## 2022-01-10 DIAGNOSIS — M76822 Posterior tibial tendinitis, left leg: Secondary | ICD-10-CM

## 2022-01-16 ENCOUNTER — Ambulatory Visit: Payer: Medicare HMO | Admitting: Physical Therapy

## 2022-01-17 ENCOUNTER — Telehealth: Payer: Self-pay | Admitting: Physical Therapy

## 2022-01-17 DIAGNOSIS — D225 Melanocytic nevi of trunk: Secondary | ICD-10-CM | POA: Diagnosis not present

## 2022-01-17 DIAGNOSIS — D235 Other benign neoplasm of skin of trunk: Secondary | ICD-10-CM | POA: Diagnosis not present

## 2022-01-17 NOTE — Telephone Encounter (Signed)
Called pt to inquiry about upcoming apt. She cannot make tomorrow's apt, so she was put on schedule for Monday for evaluation.

## 2022-01-18 ENCOUNTER — Ambulatory Visit: Payer: Medicare HMO | Admitting: Physical Therapy

## 2022-01-22 ENCOUNTER — Other Ambulatory Visit: Payer: Self-pay

## 2022-01-22 ENCOUNTER — Ambulatory Visit: Payer: Medicare HMO | Attending: Podiatry | Admitting: Physical Therapy

## 2022-01-22 ENCOUNTER — Ambulatory Visit: Payer: Medicare HMO | Admitting: Physical Therapy

## 2022-01-22 ENCOUNTER — Encounter: Payer: Medicare HMO | Admitting: Physical Therapy

## 2022-01-22 DIAGNOSIS — M76822 Posterior tibial tendinitis, left leg: Secondary | ICD-10-CM | POA: Diagnosis not present

## 2022-01-22 DIAGNOSIS — M25572 Pain in left ankle and joints of left foot: Secondary | ICD-10-CM | POA: Diagnosis not present

## 2022-01-22 DIAGNOSIS — S93692A Other sprain of left foot, initial encounter: Secondary | ICD-10-CM | POA: Diagnosis not present

## 2022-01-22 DIAGNOSIS — Q666 Other congenital valgus deformities of feet: Secondary | ICD-10-CM | POA: Diagnosis not present

## 2022-01-22 DIAGNOSIS — M66872 Spontaneous rupture of other tendons, left ankle and foot: Secondary | ICD-10-CM | POA: Diagnosis not present

## 2022-01-22 NOTE — Therapy (Unsigned)
OUTPATIENT PHYSICAL THERAPY LOWER EXTREMITY EVALUATION   Patient Name: Lindsey Wolf MRN: [id number] DOB:1966-04-18, 56 y.o., female Today's Date: 01/23/2022   PT End of Session - 01/23/22 1259     Visit Number 1    Number of Visits 1    PT Start Time 1545    PT Stop Time 1630    PT Time Calculation (min) 45 min    Activity Tolerance Patient tolerated treatment well    Behavior During Therapy WFL for tasks assessed/performed             Past Medical History:  Diagnosis Date   Abnormal respirations    Abscess of abdominal cavity (HCC)    Anxiety    Depression    Empyema of left pleural space (HCC)    GERD (gastroesophageal reflux disease)    Headache    Hypokalemia    Perforated ulcer ([place])    Pleural effusion    Protein-calorie malnutrition, severe 08/17/2016   Respiratory distress    Past Surgical History:  Procedure Laterality Date   ABDOMINAL WALL DEFECT REPAIR N/A 11/21/2017   Procedure: REPAIR ABDOMINAL WALL, ABDOMINAL WALL RECONSTRUCTION with mesh;  Surgeon: Jules Husbands, MD;  Location: [facility] ORS;  Service: General;  Laterality: N/A;   APPLICATION OF WOUND VAC N/A 08/12/2016   Procedure: ,remove 2  JP drains,add one pennrose;  Surgeon: Florene Glen, MD;  Location: [facility];  Service: General;  Laterality: N/A;   CHEST TUBE INSERTION Right 08/08/2016   Procedure: CHEST TUBE INSERTION;  Surgeon: [name], MD;  Location: [facility];  Service: General;  Laterality: Right;   COLONOSCOPY WITH PROPOFOL N/A 04/29/2018   Procedure: COLONOSCOPY WITH PROPOFOL;  Surgeon: Lollie Sails, MD;  Location: [facility]OSCOPY;  Service: Endoscopy;  Laterality: N/A;   DEBRIDEMENT OF ABDOMINAL WALL ABSCESS N/A 08/08/2016   Procedure: DEBRIDEMENT OF ABDOMINAL WALL ABSCESS;  Surgeon: Florene Glen, MD;  Location: [facility];  Service: General;  Laterality: N/A;   DIAGNOSTIC LAPAROSCOPY     ESOPHAGOGASTRODUODENOSCOPY (EGD) WITH PROPOFOL N/A 02/19/2017   Procedure:  ESOPHAGOGASTRODUODENOSCOPY (EGD) WITH PROPOFOL;  Surgeon: Jonathon Bellows, MD;  Location: [facility] ENDOSCOPY;  Service: Gastroenterology;  Laterality: N/A;   ESOPHAGOGASTRODUODENOSCOPY (EGD) WITH PROPOFOL N/A 01/20/2018   Procedure: ESOPHAGOGASTRODUODENOSCOPY (EGD) WITH PROPOFOL;  Surgeon: Lollie Sails, MD;  Location: [facility] ENDOSCOPY;  Service: Endoscopy;  Laterality: N/A;   LAPAROTOMY N/A 07/20/2016   Procedure: EXPLORATORY LAPAROTOMY;  Surgeon: Jules Husbands, MD;  Location: [facility];  Service: General;  Laterality: N/A;   LAPAROTOMY N/A 07/30/2016   Procedure: EXPLORATORY LAPAROTOMY drainage peritoneal abscess;  Surgeon: Clayburn Pert, MD;  Location: [facility];  Service: General;  Laterality: N/A;   LYSIS OF ADHESION  07/30/2016   Procedure: LYSIS OF ADHESION;  Surgeon: Clayburn Pert, MD;  Location: [facility];  Service: General;;   TONSILLECTOMY     VIDEO ASSISTED THORACOSCOPY (VATS)/THOROCOTOMY Left 08/08/2016   Procedure: VIDEO ASSISTED THORACOSCOPY (VATS)/THOROCOTOMY Possible Thoracotomy;  Surgeon: [name], MD;  Location: [facility];  Service: General;  Laterality: Left;   Patient Active Problem List   Diagnosis Date Noted   Perennial allergic rhinitis 09/18/2018   Recurrent major depressive disorder, in partial remission ([place]) 09/18/2018   Tobacco use 09/18/2018   S/P repair of ventral hernia 11/21/2017   Depressed mood 11/08/2017   Headache disorder 11/08/2017   Arthropathy of lumbar facet joint 11/20/2016   Impingement syndrome of left shoulder region 11/20/2016   Osteoarthritis of knee 11/20/2016  Anxiety    Depression    GERD (gastroesophageal reflux disease)    Hypokalemia    Protein-calorie malnutrition, severe 08/17/2016   Empyema of left pleural space (HCC)    Abscess of abdominal cavity (HCC)    Abnormal respirations    Pleural effusion    Respiratory distress    Perforated ulcer (HCC)     PCP: Dr. Tracie Harrier   REFERRING PROVIDER: Dr. Lanae Crumbly    REFERRING DIAG: Q66.6 (ICD-10-CM) - Pes planovalgus M76.822 (ICD-10-CM) - Posterior tibial tendon dysfunction (PTTD) of left lower extremity S93.692A (ICD-10-CM) - Spring ligament tear, left, initial encounter [phone] (ICD-10-CM) - Tibialis posterior tendon tear, nontraumatic, left  THERAPY DIAG:  Pain in left ankle and joints of left foot  Rationale for Evaluation and Treatment Rehabilitation  ONSET DATE: 06/05/2019  SUBJECTIVE:   SUBJECTIVE STATEMENT: Patient reports that she would only like home exercises and not keep coming to physical therapy appointments.   PERTINENT HISTORY: Patient reports that her foot tends to over pronate. She does wear good feet insoles that do help with over pronation. The pain really bothers her at night after walking during the day. The pain is most located on the inside of her ankle. She has received a richie brace but they had to make adjustments to it. It did not provide much arch support and it will be hard for her to find shoes for the brace to fit into.   PAIN:  Are you having pain? Yes: NPRS scale: 7/10 Pain location: Medial side of left ankle  Pain description: Achy  Aggravating factors: Walking  Relieving factors: Sitting still   PRECAUTIONS: None  WEIGHT BEARING RESTRICTIONS No  FALLS:  Has patient fallen in last 6 months? No  LIVING ENVIRONMENT: Lives with: lives with their son Lives in: House/apartment Stairs: Yes: External: 5 steps; on right going up; there is ramp inside the hous too. Has following equipment at home: Ramped entry and Richie Brace   OCCUPATION: Works from home   PLOF: Independent  PATIENT GOALS    OBJECTIVE:   DIAGNOSTIC FINDINGS:   Radiographs: Multiple views x-ray of the left foot: New foot films taken today show pes planus deformity with collapse of the medial arch,  elevation of the first ray, she has valgus of the heel noted as well   CT scan 12/14/2021 IMPRESSION: 1. Severe posterior tibialis  tendinosis with probable partial tear. 2. Pes planovalgus. 3. Mild talonavicular joint osteoarthritis.  PATIENT SURVEYS:  FOTO 52/100  COGNITION:  Overall cognitive status: Within functional limits for tasks assessed     SENSATION: WFL  EDEMA:  No swelling noted right now; only happens with increased walking  MUSCLE LENGTH: Hamstrings: Right 90 deg; Left 90 deg Thomas test: Negative bilateral   POSTURE: No Significant postural limitations  PALPATION: Tarsal tunnel of left foot TTP   LOWER EXTREMITY ROM:    Active  Right 01/22/2022 Left 01/22/2022  Hip flexion 120 120  Hip extension 30 30  Hip abduction 45 45  Hip adduction 30 30  Hip internal rotation 45 45  Hip external rotation 45 45  Knee flexion 135 135  Knee extension 0 0  Ankle dorsiflexion 20 20  Ankle plantarflexion 50 50  Ankle inversion 45 45  Ankle eversion 60 55   (Blank rows = not tested)     LOWER EXTREMITY MMT:  MMT Right eval Left eval  Hip flexion    Hip extension    Hip abduction  Hip adduction    Hip internal rotation    Hip external rotation    Knee flexion 5 5  Knee extension 5 5  Ankle dorsiflexion 5 5  Ankle plantarflexion 5 5  Ankle inversion 5 5  Ankle eversion 5 5   (Blank rows = not tested)  GAIT: Distance walked: 40 ft  Assistive device utilized: None Level of assistance: Complete Independence Comments: Ankle overpronation and valgus knees     TODAY'S TREATMENT: Single leg stance 2 x 30 sec holds    -Pt unable to maintain position without use of UE support  Tandem stance 2 x 30 sec holds  Heel Raises with ball holds on heel for emphasis on posterior tibialis 2 x 10    PATIENT EDUCATION:  Education details: Explanation about deficits and HEP  Person educated: Patient Education method: Consulting civil engineer, Demonstration, Verbal cues, and Handouts Education comprehension: verbalized understanding and returned demonstration   HOME EXERCISE PROGRAM: Access Code:  [email or web address] URL: [email or web address] Date: 01/22/2022 Prepared by: Bradly Chris  Exercises - Tandem Stance  - 1 x daily - 7 x weekly - 5 reps - 30 hold - Standing Calf Raise With Small Ball at [place]  - 1 x daily - 3 x weekly - 3 sets - 10 reps  ASSESSMENT:  CLINICAL IMPRESSION: Patient is a 56 y.o. white female who was seen today for physical therapy evaluation and treatment for left ankle pain in the setting of pes planovalgus and tibialis tendinosis and talonavicular joint OA . She exhibits hypermobility with ankle eversion and excessive ankle pronation and knee valgus. Unable to provoke any pain during testing and no deficits noted with strength. Pt requesting HEP and to defer further PT given time constraints. Pt given exercises to strengthen ankles and medial longitudinal arches. Discharge from PT.    OBJECTIVE IMPAIRMENTS Abnormal gait, decreased balance, and hypermobility .   ACTIVITY LIMITATIONS carrying, stairs, and locomotion level  PARTICIPATION LIMITATIONS: community activity, yard work, and church  PERSONAL FACTORS Past/current experiences and Time since onset of injury/illness/exacerbation are also affecting patient's functional outcome.   REHAB POTENTIAL: Fair severity of pronation requires further interventions beyond scope of PT   CLINICAL DECISION MAKING: Stable/uncomplicated  EVALUATION COMPLEXITY: Low   GOALS: Goals reviewed with patient? No  SHORT TERM GOALS: Target date: 02/06/2022  Pt will be independent with HEP in order to improve strength and balance in order to decrease fall risk and improve function at home and work. Baseline: Able to perform independently  Goal status: Achieved   2.  Pt will be educated on cross training options to be able to pursue aerobic exercise.  Baseline: Pt verbalizes understanding Goal status: MET    LONG TERM GOALS: Target date: 03/20/2022   Patient will have improved function and activity level as  evidenced by an increase in FOTO score by 10 points or more.  Baseline: 52/100 with target of 57 Goal status: Deferred      PLAN: PT FREQUENCY: N/a  PT DURATION: N/a  PLANNED INTERVENTIONS: N/a   PLAN FOR NEXT SESSION: Discharge from [facility], DPT  01/23/2022, 1:00 PM

## 2022-01-23 ENCOUNTER — Encounter: Payer: Self-pay | Admitting: Physical Therapy

## 2022-01-24 ENCOUNTER — Encounter: Payer: Self-pay | Admitting: Podiatry

## 2022-01-24 ENCOUNTER — Ambulatory Visit: Payer: Medicare HMO | Admitting: Physical Therapy

## 2022-01-24 ENCOUNTER — Ambulatory Visit (INDEPENDENT_AMBULATORY_CARE_PROVIDER_SITE_OTHER): Payer: Medicare HMO | Admitting: Podiatry

## 2022-01-24 DIAGNOSIS — Q666 Other congenital valgus deformities of feet: Secondary | ICD-10-CM | POA: Diagnosis not present

## 2022-01-24 DIAGNOSIS — S93692A Other sprain of left foot, initial encounter: Secondary | ICD-10-CM | POA: Diagnosis not present

## 2022-01-24 DIAGNOSIS — M76822 Posterior tibial tendinitis, left leg: Secondary | ICD-10-CM | POA: Diagnosis not present

## 2022-01-24 DIAGNOSIS — M21862 Other specified acquired deformities of left lower leg: Secondary | ICD-10-CM

## 2022-01-25 NOTE — Progress Notes (Signed)
Subjective:  Patient ID: Lindsey Wolf, female    DOB: [birth year],  MRN: [id number]  Chief Complaint  Patient presents with   Foot Pain    "It's about the same.  I haven't got my brace yet, he's had to make adjustments to it and I just started physical therapy on Monday."    56 y.o. female presents with the above complaint. History confirmed with patient.  She is referred to me by Dr. Milinda Pointer for evaluation of adult flatfoot disease.  She was previously under the care of Dr. Luana Shu as well she has been fitted for a Richie brace last month but has not received it yet.  The pain is on the inside of the ankle pulls up from the arch.  Denies any specific trauma.  She recently completed a CT scan.  Anti-inflammatories overall have not been helpful in eliminating this for her.  She is limiting how much she can take these due to her gastric reflux disease  Interval history: She just her to physical therapy.  She is going to pick up her adjusted brace today.  Symptoms unchanged still quite painful.  She completed the MRI  Objective:  Physical Exam: warm, good capillary refill, no trophic changes or ulcerative lesions, normal DP and PT pulses, normal sensory exam, and she has pain on palpation along the posterior tibial tendon primarily in the retromalleolar groove, distally into the mid arch and with resisted inversion.  She has mild gastrocnemius equinus   Radiographs: Multiple views x-ray of the left foot: New foot films taken today show pes planus deformity with collapse of the medial arch,  elevation of the first ray, she has valgus of the heel noted as well  CT scan 12/14/2021 IMPRESSION: 1. 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 2. Pes planovalgus. 3. Mild talonavicular joint osteoarthritis.  IMPRESSION: 1. Severe tendinosis of the posterior tibial tendon with a longitudinal split tear and interstitial tear with mild tenosynovitis. 2. Mild tendinosis of the  peroneus brevis. Mild peroneal tenosynovitis.     Electronically Signed   By: Kathreen Devoid M.D.   On: 01/10/2022 11:58  Assessment:   1. Posterior tibial tendon dysfunction (PTTD) of left lower extremity   2. Pes planovalgus   3. Spring ligament tear, left, initial encounter      Plan:  Patient was evaluated and treated and all questions answered.  Reviewed the results of her MRI today.  Discussed there is severe tendinosis of the posterior tibial tendon with a large tear.  FDL is intact.  I do think eventually she is going to need surgical intervention.  We discussed the risk benefits and potential complications of this today including but not limited to  pain, swelling, infection, scar, numbness which may be temporary or permanent, chronic pain, stiffness, nerve pain or damage, wound healing problems, bone healing problems including delayed or non-union.  Surgical we discussed flatfoot reconstruction with gastrocnemius recession, medial displacement calcaneal osteotomy, cotton osteotomy, transfer of the flexor digitorum longus tendon and spring ligament imbrication.  She is hoping still to avoid surgery and hopefully physical therapy and the adjusted brace can offer some benefit.  Informed consent was signed and reviewed today so she may be scheduled in the event this is not successful.  I will see her back in 2 months for follow-up    Surgical plan:  Procedure: -Left foot flatfoot reconstruction with gastrocnemius recession, medial displacement calcaneal osteotomy, cotton osteotomy, transfer of the flexor digitorum longus tendon and  spring ligament imbrication.  Location: -[facility]  Anesthesia plan: -General anesthesia with a regional popliteal saphenous block  Postoperative pain plan: - Tylenol 1000 mg every 6 hours, gabapentin 300 mg every 8 hours x5 days, oxycodone 5 mg 1-2 tabs every 6 hours only as needed  DVT prophylaxis: -Xarelto 10 mg nightly  WB Restrictions / DME  needs: -NWB in splint postop    Return in about 2 months (around 03/26/2022) for follow up on left foot bracing and PT .

## 2022-01-30 ENCOUNTER — Encounter: Payer: Medicare HMO | Admitting: Physical Therapy

## 2022-02-07 ENCOUNTER — Encounter: Payer: Medicare HMO | Admitting: Physical Therapy

## 2022-02-12 ENCOUNTER — Telehealth: Payer: Self-pay | Admitting: Acute Care

## 2022-02-12 NOTE — Telephone Encounter (Signed)
Most medicare cover 100% for annual but not all. I usually give patient the CPT code and description of the CT and tell the patient if concerned they should call their insurance.  We were told that it is approved under Medicare but there was a clause that some of the medicare plans had that they could send the patient partial bills, for example sometimes it seems the reading fee is an issue.  Since we don't know it is usually advised the patient hear the answer direct from insurance.

## 2022-02-13 ENCOUNTER — Encounter: Payer: Medicare HMO | Admitting: Physical Therapy

## 2022-02-15 ENCOUNTER — Encounter: Payer: Medicare HMO | Admitting: Physical Therapy

## 2022-02-20 ENCOUNTER — Encounter: Payer: Medicare HMO | Admitting: Physical Therapy

## 2022-02-22 ENCOUNTER — Encounter: Payer: Self-pay | Admitting: Emergency Medicine

## 2022-02-22 ENCOUNTER — Emergency Department: Payer: Medicare HMO

## 2022-02-22 ENCOUNTER — Emergency Department
Admission: EM | Admit: 2022-02-22 | Discharge: 2022-02-22 | Disposition: A | Discharge status: Home / Self Care | Payer: Medicare HMO | Attending: Emergency Medicine | Admitting: Emergency Medicine

## 2022-02-22 ENCOUNTER — Encounter: Payer: Medicare HMO | Admitting: Physical Therapy

## 2022-02-22 DIAGNOSIS — K253 Acute gastric ulcer without hemorrhage or perforation: Secondary | ICD-10-CM | POA: Insufficient documentation

## 2022-02-22 DIAGNOSIS — K5730 Diverticulosis of large intestine without perforation or abscess without bleeding: Secondary | ICD-10-CM | POA: Diagnosis not present

## 2022-02-22 DIAGNOSIS — G8929 Other chronic pain: Secondary | ICD-10-CM

## 2022-02-22 DIAGNOSIS — K279 Peptic ulcer, site unspecified, unspecified as acute or chronic, without hemorrhage or perforation: Secondary | ICD-10-CM | POA: Diagnosis not present

## 2022-02-22 DIAGNOSIS — R1013 Epigastric pain: Secondary | ICD-10-CM | POA: Diagnosis not present

## 2022-02-22 LAB — URINALYSIS, ROUTINE W REFLEX MICROSCOPIC
Glucose, UA: NEGATIVE mg/dL
Hgb urine dipstick: NEGATIVE
Ketones, ur: 15 mg/dL — AB
Leukocytes,Ua: NEGATIVE
Nitrite: NEGATIVE
Specific Gravity, Urine: 1.02 (ref 1.005–1.030)
pH: 5.5 (ref 5.0–8.0)

## 2022-02-22 LAB — CBC
HCT: 45.1 % (ref 36.0–46.0)
Hemoglobin: 14.7 g/dL (ref 12.0–15.0)
MCH: 30.7 pg (ref 26.0–34.0)
MCHC: 32.6 g/dL (ref 30.0–36.0)
MCV: 94.2 fL (ref 80.0–100.0)
Platelets: 379 10*3/uL (ref 150–400)
RBC: 4.79 MIL/uL (ref 3.87–5.11)
RDW: 13.9 % (ref 11.5–15.5)
WBC: 11 10*3/uL — ABNORMAL HIGH (ref 4.0–10.5)
nRBC: 0 % (ref 0.0–0.2)

## 2022-02-22 LAB — COMPREHENSIVE METABOLIC PANEL
ALT: 14 U/L (ref 0–44)
AST: 15 U/L (ref 15–41)
Albumin: 4.1 g/dL (ref 3.5–5.0)
Alkaline Phosphatase: 108 U/L (ref 38–126)
Anion gap: 11 (ref 5–15)
BUN: 18 mg/dL (ref 6–20)
CO2: 27 mmol/L (ref 22–32)
Calcium: 9.5 mg/dL (ref 8.9–10.3)
Chloride: 101 mmol/L (ref 98–111)
Creatinine, Ser: 0.74 mg/dL (ref 0.44–1.00)
GFR, Estimated: >60 mL/min (ref >60)
Glucose, Bld: 114 mg/dL — ABNORMAL HIGH (ref 70–99)
Potassium: 3 mmol/L — ABNORMAL LOW (ref 3.5–5.1)
Sodium: 139 mmol/L (ref 135–145)
Total Bilirubin: 0.5 mg/dL (ref 0.3–1.2)
Total Protein: 7.2 g/dL (ref 6.5–8.1)

## 2022-02-22 LAB — LIPASE, BLOOD: Lipase: 30 U/L (ref 11–51)

## 2022-02-22 MED ORDER — MAALOX MAX 400-400-40 MG/5ML PO SUSP: 5.0000 mL | Freq: Four times a day (QID) | ORAL | 0 refills | Status: DC | PRN

## 2022-02-22 MED ORDER — ALUM & MAG HYDROXIDE-SIMETH 200-200-20 MG/5ML PO SUSP
30.0000 mL | Freq: Once | ORAL | Status: AC
  Administered 2022-02-22: 30 mL via ORAL
  Filled 2022-02-22: qty 30

## 2022-02-22 MED ORDER — IOHEXOL 300 MG/ML  SOLN
100.0000 mL | Freq: Once | INTRAMUSCULAR | Status: AC | PRN
  Administered 2022-02-22: 100 mL via INTRAVENOUS

## 2022-02-22 MED ORDER — LACTATED RINGERS IV BOLUS
1000.0000 mL | Freq: Once | INTRAVENOUS | Status: AC
  Administered 2022-02-22: 1000 mL via INTRAVENOUS

## 2022-02-22 MED ORDER — LIDOCAINE VISCOUS HCL 2 % MT SOLN: 5.0000 mL | OROMUCOSAL | 0 refills | Status: AC | PRN

## 2022-02-22 NOTE — ED Notes (Signed)
DC instructions given verbally and in writing... understanding voiced, not able to obtain signature as pad not working.  Pt left in stable condition to POV.

## 2022-02-22 NOTE — ED Triage Notes (Signed)
Pt presents via POV with complaints of Emesis for the last few weeks that has gotten worse over the last 4 days. Hx of GERD and gastric ulcers. She notes contacting her PCP for a referral to GI but hasn't had an appointment scheduled yet. Denies CP or SOB.

## 2022-02-22 NOTE — ED Provider Notes (Signed)
[facility] Provider Note   Event Date/Time   First MD Initiated Contact with Patient 02/22/22 1957     (approximate) History  Emesis  HPI Lindsey Wolf is a 56 y.o. female with a stated past medical history of peptic ulcer disease with previous perforation who presents for epigastric abdominal pain with associated nausea that is been worsening over the past week.  Patient states that she has chronic headaches and continues to take a Goody powder despite being told that this causes her ulcerations.  Patient has been taking pantoprazole and Carafate with no relief.  Patient describes burning, 4/10, midepigastric pain that occasionally radiates up into her mid chest. ROS: Patient currently denies any vision changes, tinnitus, difficulty speaking, facial droop, sore throat, chest pain, shortness of breath, nausea/vomiting/diarrhea, dysuria, or weakness/numbness/paresthesias in any extremity   Physical Exam  Triage Vital Signs: ED Triage Vitals  Enc Vitals Group     BP 02/22/22 1935 (!) 124/93     Pulse Rate 02/22/22 1935 86     Resp 02/22/22 1935 18     Temp 02/22/22 1935 98.2 F (36.8 C)     Temp src --      SpO2 02/22/22 1935 98 %     Weight 02/22/22 1935 179 lb (81.2 kg)     Height 02/22/22 1935 '5\' 9"'$  (1.753 m)     Head Circumference --      Peak Flow --      Pain Score 02/22/22 1934 4     Pain Loc --      Pain Edu? --      Excl. in [place]? --    Most recent vital signs: Vitals:   02/22/22 2130 02/22/22 2354  BP: 127/78 (!) 150/76  Pulse: 72 78  Resp:  18  Temp:  98.1 F (36.7 C)  SpO2: 99% 98%   General: Awake, oriented x4. CV:  Good peripheral perfusion.  Resp:  Normal effort.  Abd:  No distention.  Other:  Middle-aged Caucasian female laying in bed in no acute distress ED Results / Procedures / Treatments  Labs (all labs ordered are listed, but only abnormal results are displayed) Labs Reviewed  COMPREHENSIVE METABOLIC PANEL - Abnormal;  Notable for the following components:      Result Value   Potassium 3.0 (*)    Glucose, Bld 114 (*)    All other components within normal limits  CBC - Abnormal; Notable for the following components:   WBC 11.0 (*)    All other components within normal limits  URINALYSIS, ROUTINE W REFLEX MICROSCOPIC - Abnormal; Notable for the following components:   Color, Urine YELLOW (*)    APPearance CLEAR (*)    Bilirubin Urine SMALL (*)    Ketones, ur 15 (*)    Protein, ur TRACE (*)    Bacteria, UA RARE (*)    All other components within normal limits  LIPASE, BLOOD  RADIOLOGY ED MD interpretation: CT of the abdomen and pelvis with IV contrast shows peptic ulcer disease with multiple ulcerations in the gastric antrum with the largest extending into the adventitial surface without frank perforation -Agree with radiology assessment Official radiology report(s): CT Abdomen Pelvis W Contrast  Result Date: 02/22/2022 CLINICAL DATA:  Worsening nausea and vomiting over last 4 days, history of reflux and gastric ulcers EXAM: CT ABDOMEN AND PELVIS WITH CONTRAST TECHNIQUE: Multidetector CT imaging of the abdomen and pelvis was performed using the standard protocol following bolus administration of  intravenous contrast. RADIATION DOSE REDUCTION: This exam was performed according to the departmental dose-optimization program which includes automated exposure control, adjustment of the mA and/or kV according to patient size and/or use of iterative reconstruction technique. CONTRAST:  165m OMNIPAQUE IOHEXOL 300 MG/ML  SOLN COMPARISON:  10/11/2017 FINDINGS: Lower chest: No acute pleural or parenchymal lung disease. Hepatobiliary: Stable right lobe liver hypodensity consistent with small cyst or hemangioma. No biliary duct dilation. The gallbladder is unremarkable. Pancreas: Unremarkable. No pancreatic ductal dilatation or surrounding inflammatory changes. Spleen: Normal in size without focal abnormality.  Adrenals/Urinary Tract: Adrenal glands are unremarkable. Kidneys are normal, without renal calculi, focal lesion, or hydronephrosis. Bladder is unremarkable. Stomach/Bowel: Multiple ulcerations are seen within the gastric antrum, largest measuring 1.5 x 1.7 cm extending to the adventitial surface. No frank perforation or free intraperitoneal gas. Marked gastric antral wall thickening and thickening of the duodenal bulb, with surrounding fat stranding. No fluid collection or abscess. No bowel obstruction or ileus. Diverticulosis of the distal colon without evidence of diverticulitis. Normal appendix right lower quadrant. Vascular/Lymphatic: Aortic atherosclerosis. No enlarged abdominal or pelvic lymph nodes. Reproductive: Uterus and bilateral adnexa are unremarkable. Other: No free fluid or free intraperitoneal gas. No abdominal wall hernia. Musculoskeletal: No acute or destructive bony lesions. Reconstructed images demonstrate no additional findings. IMPRESSION: 1. Peptic ulcer disease, with multiple ulcerations in the gastric antrum as above, largest extending through to the adventitial surface without frank perforation. No fluid collection or abscess. 2.  Aortic Atherosclerosis (ICD10-I70.0). 3. Colonic diverticulosis without diverticulitis. Electronically Signed   By: MRanda NgoM.D.   On: 02/22/2022 22:20   PROCEDURES: Critical Care performed: No Procedures MEDICATIONS ORDERED IN ED: Medications  alum & mag hydroxide-simeth (MAALOX/MYLANTA) 200-200-20 MG/5ML suspension 30 mL (30 mLs Oral Given 02/22/22 2154)  lactated ringers bolus 1,000 mL (0 mLs Intravenous Stopped 02/22/22 2342)  iohexol (OMNIPAQUE) 300 MG/ML solution 100 mL (100 mLs Intravenous Contrast Given 02/22/22 2206)   IMPRESSION / MDM / ASSESSMENT AND PLAN / ED COURSE  I reviewed the triage vital signs and the nursing notes.                             The patient is on the cardiac monitor to evaluate for evidence of arrhythmia and/or  significant heart rate changes. Patient's presentation is most consistent with acute presentation with potential threat to life or bodily function. Patients symptoms not typical for emergent causes of abdominal pain such as, but not limited to, appendicitis, abdominal aortic aneurysm, surgical biliary disease, pancreatitis, SBO, mesenteric ischemia, serious intra-abdominal bacterial illness. Presentation also not typical of gynecologic emergencies such as TOA, Ovarian Torsion, PID. Not Ectopic. Doubt atypical ACS.  Pt tolerating PO. Disposition: Patient will be discharged with strict return precautions and follow up with primary MD within 12-24 hours for further evaluation. Patient understands that this still may have an early presentation of an emergent medical condition such as appendicitis that will require a recheck.   FINAL CLINICAL IMPRESSION(S) / ED DIAGNOSES   Final diagnoses:  Acute gastric ulcer without hemorrhage or perforation  Abdominal pain, chronic, epigastric   Rx / DC Orders   ED Discharge Orders          Ordered    alum & mag hydroxide-simeth (MAALOX MAX) 4694-854-62MG/5ML suspension  Every 6 hours PRN        02/22/22 2229    lidocaine (XYLOCAINE) 2 % solution  As needed  02/22/22 2229           Note:  This document was prepared using Dragon voice recognition software and may include unintentional dictation errors.   Naaman Plummer, MD 02/23/22 [phone]

## 2022-02-27 ENCOUNTER — Encounter: Payer: Medicare HMO | Admitting: Physical Therapy

## 2022-02-28 ENCOUNTER — Ambulatory Visit (INDEPENDENT_AMBULATORY_CARE_PROVIDER_SITE_OTHER): Payer: Medicare HMO | Admitting: Urology

## 2022-02-28 VITALS — BP 118/77 | HR 105 | Ht 69.0 in | Wt 183.0 lb

## 2022-02-28 DIAGNOSIS — N393 Stress incontinence (female) (male): Secondary | ICD-10-CM

## 2022-02-28 DIAGNOSIS — N3281 Overactive bladder: Secondary | ICD-10-CM | POA: Diagnosis not present

## 2022-02-28 DIAGNOSIS — N3946 Mixed incontinence: Secondary | ICD-10-CM | POA: Diagnosis not present

## 2022-02-28 MED ORDER — OXYBUTYNIN CHLORIDE ER 10 MG PO TB24: 10.0000 mg | ORAL_TABLET | Freq: Every day | ORAL | 11 refills | Status: DC

## 2022-02-28 NOTE — Patient Instructions (Addendum)
Kegel Exercises  Kegel exercises can help strengthen your pelvic floor muscles. The pelvic floor is a group of muscles that support your rectum, small intestine, and bladder. In females, pelvic floor muscles also help support the uterus. These muscles help you control the flow of urine and stool (feces). Kegel exercises are painless and simple. They do not require any equipment. Your provider may suggest Kegel exercises to: Improve bladder and bowel control. Improve sexual response. Improve weak pelvic floor muscles after surgery to remove the uterus (hysterectomy) or after pregnancy, in females. Improve weak pelvic floor muscles after prostate gland removal or surgery, in males. Kegel exercises involve squeezing your pelvic floor muscles. These are the same muscles you squeeze when you try to stop the flow of urine or keep from passing gas. The exercises can be done while sitting, standing, or lying down, but it is best to vary your position. Ask your health care provider which exercises are safe for you. Do exercises exactly as told by your health care provider and adjust them as directed. Do not begin these exercises until told by your health care provider. Exercises How to do Kegel exercises: Squeeze your pelvic floor muscles tight. You should feel a tight lift in your rectal area. If you are a female, you should also feel a tightness in your vaginal area. Keep your stomach, buttocks, and legs relaxed. Hold the muscles tight for up to 10 seconds. Breathe normally. Relax your muscles for up to 10 seconds. Repeat as told by your health care provider. Repeat this exercise daily as told by your health care provider. Continue to do this exercise for at least 4-6 weeks, or for as long as told by your health care provider. You may be referred to a physical therapist who can help you learn more about how to do Kegel exercises. Depending on your condition, your health care provider may  recommend: Varying how long you squeeze your muscles. Doing several sets of exercises every day. Doing exercises for several weeks. Making Kegel exercises a part of your regular exercise routine. This information is not intended to replace advice given to you by your health care provider. Make sure you discuss any questions you have with your health care provider. Document Revised: 09/29/2020 Document Reviewed: 09/29/2020 Elsevier Patient Education  2023 Else[employer] Inc.  Overactive Bladder, Adult  Overactive bladder is a condition in which a person has a sudden and frequent need to urinate. A person might also leak urine if he or she cannot get to the bathroom fast enough (urinary incontinence). Sometimes, symptoms can interfere with work or social activities. What are the causes? Overactive bladder is associated with poor nerve signals between your bladder and your brain. Your bladder may get the signal to empty before it is full. You may also have very sensitive muscles that make your bladder squeeze too soon. This condition may also be caused by other factors, such as: Medical conditions: Urinary tract infection. Infection of nearby tissues. Prostate enlargement. Bladder stones, inflammation, or tumors. Diabetes. Muscle or nerve weakness, especially from these conditions: A spinal cord injury. Stroke. Multiple sclerosis. Parkinson's disease. Other causes: Surgery on the uterus or urethra. Drinking too much caffeine or alcohol. Certain medicines, especially those that eliminate extra fluid in the body (diuretics). Constipation. What increases the risk? You may be at greater risk for overactive bladder if you: Are an older adult. Smoke. Are going through menopause. Have prostate problems. Have a neurological disease, such as stroke, dementia, Parkinson's   disease, or multiple sclerosis (MS). Eat or drink alcohol, spicy food, caffeine, and other things that irritate the  bladder. Are overweight or obese. What are the signs or symptoms? Symptoms of this condition include a sudden, strong urge to urinate. Other symptoms include: Leaking urine. Urinating 8 or more times a day. Waking up to urinate 2 or more times overnight. How is this diagnosed? This condition may be diagnosed based on: Your symptoms and medical history. A physical exam. Blood or urine tests to check for possible causes, such as infection. You may also need to see a health care provider who specializes in urinary tract problems. This is called a urologist. How is this treated? Treatment for overactive bladder depends on the cause of your condition and whether it is mild or severe. Treatment may include: Bladder training, such as: Learning to control the urge to urinate by following a schedule to urinate at regular intervals. Doing Kegel exercises to strengthen the pelvic floor muscles that support your bladder. Special devices, such as: Biofeedback. This uses sensors to help you become aware of your body's signals. Electrical stimulation. This uses electrodes placed inside the body (implanted) or outside the body. These electrodes send gentle pulses of electricity to strengthen the nerves or muscles that control the bladder. Women may use a plastic device, called a pessary, that fits into the vagina and supports the bladder. Medicines, such as: Antibiotics to treat bladder infection. Antispasmodics to stop the bladder from releasing urine at the wrong time. Tricyclic antidepressants to relax bladder muscles. Injections of botulinum toxin type A directly into the bladder tissue to relax bladder muscles. Surgery, such as: A device may be implanted to help manage the nerve signals that control urination. An electrode may be implanted to stimulate electrical signals in the bladder. A procedure may be done to change the shape of the bladder. This is done only in very severe cases. Follow  these instructions at home: Eating and drinking  Make diet or lifestyle changes recommended by your health care provider. These may include: Drinking fluids throughout the day and not only with meals. Cutting down on caffeine or alcohol. Eating a healthy and balanced diet to prevent constipation. This may include: Choosing foods that are high in fiber, such as beans, whole grains, and fresh fruits and vegetables. Limiting foods that are high in fat and processed sugars, such as fried and sweet foods. Lifestyle  Lose weight if needed. Do not use any products that contain nicotine or tobacco. These include cigarettes, chewing tobacco, and vaping devices, such as e-cigarettes. If you need help quitting, ask your health care provider. General instructions Take over-the-counter and prescription medicines only as told by your health care provider. If you were prescribed an antibiotic medicine, take it as told by your health care provider. Do not stop taking the antibiotic even if you start to feel better. Use any implants or pessary as told by your health care provider. If needed, wear pads to absorb urine leakage. Keep a log to track how much and when you drink, and when you need to urinate. This will help your health care provider monitor your condition. Keep all follow-up visits. This is important. Contact a health care provider if: You have a fever or chills. Your symptoms do not get better with treatment. Your pain and discomfort get worse. You have more frequent urges to urinate. Get help right away if: You are not able to control your bladder. Summary Overactive bladder refers to a   condition in which a person has a sudden and frequent need to urinate. Several conditions may lead to an overactive bladder. Treatment for overactive bladder depends on the cause and severity of your condition. Making lifestyle changes, doing Kegel exercises, keeping a log, and taking medicines can help  with this condition. This information is not intended to replace advice given to you by your health care provider. Make sure you discuss any questions you have with your health care provider. Document Revised: 02/08/2020 Document Reviewed: 02/08/2020 Elsevier Patient Education  2023 Elsevier Inc.  

## 2022-02-28 NOTE — Progress Notes (Signed)
02/28/22 3:20 PM   Lindsey Wolf March 03, 1966 [id number]  CC: Overactive bladder, urge incontinence, stress incontinence  HPI: 56 year old female who presents with the above issues.  She reports at least a year of urinary urgency during the day and occasional urge incontinence that requires a liner.  She denies any significant urinary problems overnight.  No gross hematuria or dysuria.  She also has some mild stress incontinence with coughing or sneezing.  History is notable for multiple abdominal surgeries and complications including hernia and abscess for perforated gastric ulcer in 2018 with prolonged hospitalization.  Recently seen in the ER last week on 02/22/2022 and urinalysis was benign, and CT abdomen and pelvis with contrast showed peptic ulcer disease, no hydronephrosis or kidney stones, decompressed bladder.   PMH: Past Medical History:  Diagnosis Date   Abnormal respirations    Abscess of abdominal cavity (HCC)    Anxiety    Depression    Empyema of left pleural space (HCC)    GERD (gastroesophageal reflux disease)    Headache    Hypokalemia    Perforated ulcer (HCC)    Pleural effusion    Protein-calorie malnutrition, severe 08/17/2016   Respiratory distress     Surgical History: Past Surgical History:  Procedure Laterality Date   ABDOMINAL WALL DEFECT REPAIR N/A 11/21/2017   Procedure: REPAIR ABDOMINAL WALL, ABDOMINAL WALL RECONSTRUCTION with mesh;  Surgeon: Jules Husbands, MD;  Location: [facility] ORS;  Service: General;  Laterality: N/A;   APPLICATION OF WOUND VAC N/A 08/12/2016   Procedure: ,remove 2  JP drains,add one pennrose;  Surgeon: Florene Glen, MD;  Location: [facility];  Service: General;  Laterality: N/A;   CHEST TUBE INSERTION Right 08/08/2016   Procedure: CHEST TUBE INSERTION;  Surgeon: [name], MD;  Location: [facility];  Service: General;  Laterality: Right;   COLONOSCOPY WITH PROPOFOL N/A 04/29/2018   Procedure: COLONOSCOPY WITH PROPOFOL;   Surgeon: Lollie Sails, MD;  Location: [facility] ENDOSCOPY;  Service: Endoscopy;  Laterality: N/A;   DEBRIDEMENT OF ABDOMINAL WALL ABSCESS N/A 08/08/2016   Procedure: DEBRIDEMENT OF ABDOMINAL WALL ABSCESS;  Surgeon: Florene Glen, MD;  Location: [facility];  Service: General;  Laterality: N/A;   DIAGNOSTIC LAPAROSCOPY     ESOPHAGOGASTRODUODENOSCOPY (EGD) WITH PROPOFOL N/A 02/19/2017   Procedure: ESOPHAGOGASTRODUODENOSCOPY (EGD) WITH PROPOFOL;  Surgeon: Jonathon Bellows, MD;  Location: [facility] ENDOSCOPY;  Service: Gastroenterology;  Laterality: N/A;   ESOPHAGOGASTRODUODENOSCOPY (EGD) WITH PROPOFOL N/A 01/20/2018   Procedure: ESOPHAGOGASTRODUODENOSCOPY (EGD) WITH PROPOFOL;  Surgeon: Lollie Sails, MD;  Location: [facility] ENDOSCOPY;  Service: Endoscopy;  Laterality: N/A;   LAPAROTOMY N/A 07/20/2016   Procedure: EXPLORATORY LAPAROTOMY;  Surgeon: Jules Husbands, MD;  Location: [facility]S;  Service: General;  Laterality: N/A;   LAPAROTOMY N/A 07/30/2016   Procedure: EXPLORATORY LAPAROTOMY drainage peritoneal abscess;  Surgeon: Clayburn Pert, MD;  Location: [facility];  Service: General;  Laterality: N/A;   LYSIS OF ADHESION  07/30/2016   Procedure: LYSIS OF ADHESION;  Surgeon: Clayburn Pert, MD;  Location: [facility];  Service: General;;   TONSILLECTOMY     VIDEO ASSISTED THORACOSCOPY (VATS)/THOROCOTOMY Left 08/08/2016   Procedure: VIDEO ASSISTED THORACOSCOPY (VATS)/THOROCOTOMY Possible Thoracotomy;  Surgeon: [name], MD;  Location: [facility]S;  Service: General;  Laterality: Left;     Family History: Family History  Problem Relation Age of Onset   Cancer Mother        Lung   COPD Mother    Emphysema Mother  Neuropathy Mother    Anxiety disorder Mother    Depression Mother    Heart disease Father 39       Heart Attack   Cancer Maternal Grandmother        Doesn't remember   Emphysema Maternal Aunt    Cancer Other        Doesn't remember   Breast cancer Neg Hx     Social History:  reports that she  quit smoking about 2 months ago. Her smoking use included cigarettes. She has a 11.00 pack-year smoking history. She has never used smokeless tobacco. She reports current alcohol use. She reports that she does not use drugs.  Physical Exam: BP 118/77   Pulse (!) 105   Ht '5\' 9"'$  (1.753 m)   Wt 183 lb (83 kg)   LMP 02/19/2005   BMI 27.02 kg/m    Constitutional:  Alert and oriented, No acute distress. Cardiovascular: No clubbing, cyanosis, or edema. Respiratory: Normal respiratory effort, no increased work of breathing. GI: Abdomen is soft, nontender, nondistended, no abdominal masses   Pertinent Imaging: I have personally viewed and interpreted the CT from 02/22/2022 showing no urologic abnormalities.  Assessment & Plan:   56 year old female with mixed incontinence, primarily urge and urge incontinence, mild stress incontinence with coughing/sneezing.  History notable for multiple prior abdominal surgeries for hernia and abscess in 2018.  Recent CT and urinalysis benign.  We discussed that overactive bladder (OAB) is not a disease, but is a symptom complex that is generally not life-threatening.  Symptoms typically include urinary urgency, frequency, and urge incontinence.  There are numerous treatment options, however there are risks and benefits with both medical and surgical management.  First-line treatment is behavioral therapies including bladder training, pelvic floor muscle training, and fluid management.  Second line treatments include oral antimuscarinics(Ditropan er, Trospium) and beta-3 agonist (Mybetriq). There is typically a period of medication trial (4-8 weeks) to find the optimal therapy and dosing. If symptoms are bothersome despite the above management, third line options include intra-detrusor botox, peripheral tibial nerve stimulation (PTNS), and interstim (SNS). These are more invasive treatments with higher side effect profile, but may improve quality of life for patients  with severe OAB symptoms.   -Trial of Kegel exercises for stress incontinence, consider referral to pelvic floor physical therapy -Trial of oxybutynin 10 mg XL daily for OAB/urge incontinence -RTC PA 6 weeks symptom check, consider referral to pelvic floor physical therapy at that time if persistent symptoms, trial of different OAB med, or follow-up with Dr. Sheran Luz, MD 02/28/2022  [facility] [address] [place], [place] [address] [phone]

## 2022-03-01 ENCOUNTER — Encounter: Payer: Medicare HMO | Admitting: Physical Therapy

## 2022-03-02 DIAGNOSIS — Z720 Tobacco use: Secondary | ICD-10-CM | POA: Diagnosis not present

## 2022-03-02 DIAGNOSIS — F411 Generalized anxiety disorder: Secondary | ICD-10-CM | POA: Diagnosis not present

## 2022-03-02 DIAGNOSIS — F17210 Nicotine dependence, cigarettes, uncomplicated: Secondary | ICD-10-CM | POA: Diagnosis not present

## 2022-03-02 DIAGNOSIS — F3341 Major depressive disorder, recurrent, in partial remission: Secondary | ICD-10-CM | POA: Diagnosis not present

## 2022-03-02 DIAGNOSIS — J9801 Acute bronchospasm: Secondary | ICD-10-CM | POA: Diagnosis not present

## 2022-03-02 DIAGNOSIS — K259 Gastric ulcer, unspecified as acute or chronic, without hemorrhage or perforation: Secondary | ICD-10-CM | POA: Diagnosis not present

## 2022-03-02 DIAGNOSIS — R519 Headache, unspecified: Secondary | ICD-10-CM | POA: Diagnosis not present

## 2022-03-02 DIAGNOSIS — I700 Atherosclerosis of aorta: Secondary | ICD-10-CM | POA: Diagnosis not present

## 2022-03-02 DIAGNOSIS — J438 Other emphysema: Secondary | ICD-10-CM | POA: Diagnosis not present

## 2022-03-02 DIAGNOSIS — Z0000 Encounter for general adult medical examination without abnormal findings: Secondary | ICD-10-CM | POA: Diagnosis not present

## 2022-03-02 DIAGNOSIS — E538 Deficiency of other specified B group vitamins: Secondary | ICD-10-CM | POA: Diagnosis not present

## 2022-03-02 DIAGNOSIS — M170 Bilateral primary osteoarthritis of knee: Secondary | ICD-10-CM | POA: Diagnosis not present

## 2022-03-02 DIAGNOSIS — K279 Peptic ulcer, site unspecified, unspecified as acute or chronic, without hemorrhage or perforation: Secondary | ICD-10-CM | POA: Diagnosis not present

## 2022-03-06 ENCOUNTER — Encounter: Payer: Medicare HMO | Admitting: Physical Therapy

## 2022-03-06 DIAGNOSIS — M25562 Pain in left knee: Secondary | ICD-10-CM | POA: Diagnosis not present

## 2022-03-06 DIAGNOSIS — G8929 Other chronic pain: Secondary | ICD-10-CM | POA: Diagnosis not present

## 2022-03-06 DIAGNOSIS — M25561 Pain in right knee: Secondary | ICD-10-CM | POA: Diagnosis not present

## 2022-03-06 DIAGNOSIS — M170 Bilateral primary osteoarthritis of knee: Secondary | ICD-10-CM | POA: Diagnosis not present

## 2022-03-08 ENCOUNTER — Encounter: Payer: Medicare HMO | Admitting: Physical Therapy

## 2022-03-13 ENCOUNTER — Encounter: Payer: Medicare HMO | Admitting: Physical Therapy

## 2022-03-14 DIAGNOSIS — Z86010 Personal history of colonic polyps: Secondary | ICD-10-CM | POA: Diagnosis not present

## 2022-03-14 DIAGNOSIS — K279 Peptic ulcer, site unspecified, unspecified as acute or chronic, without hemorrhage or perforation: Secondary | ICD-10-CM | POA: Diagnosis not present

## 2022-03-15 ENCOUNTER — Encounter: Payer: Medicare HMO | Admitting: Physical Therapy

## 2022-03-28 ENCOUNTER — Ambulatory Visit: Payer: Medicare HMO | Admitting: Podiatry

## 2022-04-04 ENCOUNTER — Ambulatory Visit
Admission: RE | Admit: 2022-04-04 | Discharge: 2022-04-04 | Disposition: A | Discharge status: Home / Self Care | Payer: Medicare HMO | Source: Ambulatory Visit | Attending: Internal Medicine | Admitting: Internal Medicine

## 2022-04-04 DIAGNOSIS — F17210 Nicotine dependence, cigarettes, uncomplicated: Secondary | ICD-10-CM | POA: Diagnosis not present

## 2022-04-09 ENCOUNTER — Other Ambulatory Visit: Payer: Self-pay

## 2022-04-09 DIAGNOSIS — F17210 Nicotine dependence, cigarettes, uncomplicated: Secondary | ICD-10-CM

## 2022-04-09 DIAGNOSIS — Z122 Encounter for screening for malignant neoplasm of respiratory organs: Secondary | ICD-10-CM

## 2022-04-09 DIAGNOSIS — Z87891 Personal history of nicotine dependence: Secondary | ICD-10-CM

## 2022-04-10 NOTE — Progress Notes (Unsigned)
04/11/2022 4:30 PM   Lindsey Wolf 1965-09-16 [id number]  Referring provider: Tracie Harrier, MD [address] [facility] [place],  [place] [address]  Urological history: 1. OAB -Contributing factors of age, depression, anxiety, alcohol consumption and smoking -PVR 13 mL  2. Urge incontinence -Contributing factors of age, depression, anxiety, alcohol consumption and smoking -PVR 13 mL  3. Stress incontinence -Contributing factors of age, depression, anxiety, alcohol consumption and smoking -PVR 13 mL   Chief Complaint  Patient presents with   Over Active Bladder   Urinary Incontinence    HPI: Lindsey Wolf is a 56 y.o. female who presents today for 6-week follow-up after trial of oxybutynin XL 10 mg daily.  On her OAB questionnaire, she indicates 1-7 daytime urinations, 1-2 nocturia and a mild urge to urinate.  She has issues with stress incontinence.  She leaks 1-2 times weekly.  She wears a panty liner daily.  PVR 13 mL  She states that the oxybutynin XL 10 mg daily was helpful in reducing her incontinent episodes, but the dry mouth was intolerable.  Patient denies any modifying or aggravating factors.  Patient denies any gross hematuria, dysuria or suprapubic/flank pain.  Patient denies any fevers, chills, nausea or vomiting.                                                                                                                      PMH: Past Medical History:  Diagnosis Date   Abnormal respirations    Abscess of abdominal cavity (HCC)    Anxiety    Depression    Empyema of left pleural space (HCC)    GERD (gastroesophageal reflux disease)    Headache    Hypokalemia    Perforated ulcer (HCC)    Pleural effusion    Protein-calorie malnutrition, severe 08/17/2016   Respiratory distress     Surgical History: Past Surgical History:  Procedure Laterality Date   ABDOMINAL WALL DEFECT REPAIR N/A 11/21/2017   Procedure:  REPAIR ABDOMINAL WALL, ABDOMINAL WALL RECONSTRUCTION with mesh;  Surgeon: Jules Husbands, MD;  Location: [facility];  Service: General;  Laterality: N/A;   APPLICATION OF WOUND VAC N/A 08/12/2016   Procedure: ,remove 2  JP drains,add one pennrose;  Surgeon: Florene Glen, MD;  Location: [facility];  Service: General;  Laterality: N/A;   CHEST TUBE INSERTION Right 08/08/2016   Procedure: CHEST TUBE INSERTION;  Surgeon: [name], MD;  Location: [facility];  Service: General;  Laterality: Right;   COLONOSCOPY WITH PROPOFOL N/A 04/29/2018   Procedure: COLONOSCOPY WITH PROPOFOL;  Surgeon: Lollie Sails, MD;  Location: [facility] ENDOSCOPY;  Service: Endoscopy;  Laterality: N/A;   DEBRIDEMENT OF ABDOMINAL WALL ABSCESS N/A 08/08/2016   Procedure: DEBRIDEMENT OF ABDOMINAL WALL ABSCESS;  Surgeon: Florene Glen, MD;  Location: [facility]S;  Service: General;  Laterality: N/A;   DIAGNOSTIC LAPAROSCOPY     ESOPHAGOGASTRODUODENOSCOPY (EGD) WITH PROPOFOL N/A 02/19/2017   Procedure: ESOPHAGOGASTRODUODENOSCOPY (EGD) WITH PROPOFOL;  Surgeon: Jonathon Bellows, MD;  Location: [facility] ENDOSCOPY;  Service: Gastroenterology;  Laterality: N/A;   ESOPHAGOGASTRODUODENOSCOPY (EGD) WITH PROPOFOL N/A 01/20/2018   Procedure: ESOPHAGOGASTRODUODENOSCOPY (EGD) WITH PROPOFOL;  Surgeon: Lollie Sails, MD;  Location: [facility] ENDOSCOPY;  Service: Endoscopy;  Laterality: N/A;   LAPAROTOMY N/A 07/20/2016   Procedure: EXPLORATORY LAPAROTOMY;  Surgeon: Jules Husbands, MD;  Location: [facility]S;  Service: General;  Laterality: N/A;   LAPAROTOMY N/A 07/30/2016   Procedure: EXPLORATORY LAPAROTOMY drainage peritoneal abscess;  Surgeon: Clayburn Pert, MD;  Location: [facility];  Service: General;  Laterality: N/A;   LYSIS OF ADHESION  07/30/2016   Procedure: LYSIS OF ADHESION;  Surgeon: Clayburn Pert, MD;  Location: [facility]S;  Service: General;;   TONSILLECTOMY     VIDEO ASSISTED THORACOSCOPY (VATS)/THOROCOTOMY Left 08/08/2016   Procedure: VIDEO ASSISTED  THORACOSCOPY (VATS)/THOROCOTOMY Possible Thoracotomy;  Surgeon: [name], MD;  Location: [facility] ORS;  Service: General;  Laterality: Left;    Home Medications:  Allergies as of 04/11/2022   No Known Allergies      Medication List        Accurate as of April 11, 2022  4:30 PM. If you have any questions, ask your nurse or doctor.          STOP taking these medications    oxybutynin 10 MG 24 hr tablet Commonly known as: DITROPAN-XL       TAKE these medications    albuterol 108 (90 Base) MCG/ACT inhaler Commonly known as: VENTOLIN HFA Inhale 2 puffs into the lungs every 6 (six) hours as needed for wheezing or shortness of breath.   ARIPiprazole 5 MG tablet Commonly known as: ABILIFY Take 5 mg by mouth at bedtime.   clonazePAM 1 MG tablet Commonly known as: KLONOPIN Take by mouth.   Gemtesa 75 MG Tabs Generic drug: Vibegron Take 75 mg by mouth daily.   HYDROcodone-acetaminophen 5-325 MG tablet Commonly known as: NORCO/VICODIN Take 0.5 tablets by mouth 2 (two) times daily as needed.   Maalox Max 073-710-62 MG/5ML suspension Generic drug: alum & mag hydroxide-simeth Take 5 mLs by mouth every 6 (six) hours as needed for indigestion.   pantoprazole 40 MG tablet Commonly known as: PROTONIX TAKE 1 TABLET BY MOUTH TWICE DAILY   rosuvastatin 5 MG tablet Commonly known as: CRESTOR Take 5 mg by mouth daily.   topiramate 100 MG tablet Commonly known as: TOPAMAX   traZODone 50 MG tablet Commonly known as: DESYREL Take 25-100 mg by mouth at bedtime.   Trelegy Ellipta 100-62.5-25 MCG/ACT Aepb Generic drug: Fluticasone-Umeclidin-Vilant Inhale 1 puff into the lungs daily.   Trintellix 20 MG Tabs tablet Generic drug: vortioxetine HBr Take 20 mg by mouth daily.   varenicline 0.5 MG tablet Commonly known as: CHANTIX Take 0.5 mg by mouth 2 (two) times daily.        Allergies: No Known Allergies  Family History: Family History  Problem Relation Age of  Onset   Cancer Mother        Lung   COPD Mother    Emphysema Mother    Neuropathy Mother    Anxiety disorder Mother    Depression Mother    Heart disease Father 74       Heart Attack   Cancer Maternal Grandmother        Doesn't remember   Emphysema Maternal Aunt    Cancer Other        Doesn't remember   Breast cancer Neg Hx  Social History:  reports that she quit smoking about 4 months ago. Her smoking use included cigarettes. She has a 11.00 pack-year smoking history. She has never used smokeless tobacco. She reports current alcohol use. She reports that she does not use drugs.  ROS: Pertinent ROS in HPI  Physical Exam: BP 122/82   Pulse (!) 101   Ht '5\' 9"'$  (1.753 m)   Wt 183 lb (83 kg)   LMP 02/19/2005   BMI 27.02 kg/m   Constitutional:  Well nourished. Alert and oriented, No acute distress. HEENT: [place] AT, moist mucus membranes.  Trachea midline Cardiovascular: No clubbing, cyanosis, or edema. Respiratory: Normal respiratory effort, no increased work of breathing. Neurologic: Grossly intact, no focal deficits, moving all 4 extremities. Psychiatric: Normal mood and affect.    Laboratory Data: Lab Results  Component Value Date   WBC 11.0 (H) 02/22/2022   HGB 14.7 02/22/2022   HCT 45.1 02/22/2022   MCV 94.2 02/22/2022   PLT 379 02/22/2022    Lab Results  Component Value Date   CREATININE 0.74 02/22/2022    Lab Results  Component Value Date   AST 15 02/22/2022   Lab Results  Component Value Date   ALT 14 02/22/2022   Urinalysis    Component Value Date/Time   COLORURINE YELLOW (A) 02/22/2022 1936   APPEARANCEUR CLEAR (A) 02/22/2022 1936   LABSPEC 1.020 02/22/2022 1936   PHURINE 5.5 02/22/2022 1936   GLUCOSEU NEGATIVE 02/22/2022 1936   HGBUR NEGATIVE 02/22/2022 1936   BILIRUBINUR SMALL (A) 02/22/2022 1936   KETONESUR 15 (A) 02/22/2022 1936   PROTEINUR TRACE (A) 02/22/2022 1936   NITRITE NEGATIVE 02/22/2022 1936   LEUKOCYTESUR NEGATIVE 02/22/2022  1936  I have reviewed the labs.   Pertinent Imaging:  04/11/22 11:21  Scan Result 52m     Assessment & Plan:    1. OAB -She found the oxybutynin helped control her OAB, but she states that the dry mouth was intolerable -We discussed trying another OAB agent from the beta-3 agonist group and she is agreeable -Gave her samples of Gemtesa 75 mg daily  2. Urge incontinence -see # 1  3. SUI -Patient has been doing Kegel exercises at home and also feels that this has been helpful and she will continue to do so  Return in about 1 month (around 05/11/2022) for PVR and OAB questionnaire.  These notes generated with voice recognition software. I apologize for typographical errors.  [facility] [place] [phone]

## 2022-04-11 ENCOUNTER — Encounter: Payer: Self-pay | Admitting: Urology

## 2022-04-11 ENCOUNTER — Ambulatory Visit (INDEPENDENT_AMBULATORY_CARE_PROVIDER_SITE_OTHER): Payer: Medicare HMO | Admitting: Urology

## 2022-04-11 VITALS — BP 122/82 | HR 101 | Ht 69.0 in | Wt 183.0 lb

## 2022-04-11 DIAGNOSIS — N3941 Urge incontinence: Secondary | ICD-10-CM

## 2022-04-11 DIAGNOSIS — N3946 Mixed incontinence: Secondary | ICD-10-CM | POA: Diagnosis not present

## 2022-04-11 DIAGNOSIS — N3281 Overactive bladder: Secondary | ICD-10-CM

## 2022-04-11 DIAGNOSIS — N393 Stress incontinence (female) (male): Secondary | ICD-10-CM

## 2022-04-11 LAB — BLADDER SCAN AMB NON-IMAGING

## 2022-04-11 MED ORDER — GEMTESA 75 MG PO TABS: 75.0000 mg | ORAL_TABLET | Freq: Every day | ORAL | 0 refills | Status: DC

## 2022-04-19 DIAGNOSIS — D124 Benign neoplasm of descending colon: Secondary | ICD-10-CM | POA: Diagnosis not present

## 2022-04-19 DIAGNOSIS — Z86010 Personal history of colonic polyps: Secondary | ICD-10-CM | POA: Diagnosis not present

## 2022-04-19 DIAGNOSIS — K279 Peptic ulcer, site unspecified, unspecified as acute or chronic, without hemorrhage or perforation: Secondary | ICD-10-CM | POA: Diagnosis not present

## 2022-04-19 DIAGNOSIS — K5730 Diverticulosis of large intestine without perforation or abscess without bleeding: Secondary | ICD-10-CM | POA: Diagnosis not present

## 2022-05-01 DIAGNOSIS — M170 Bilateral primary osteoarthritis of knee: Secondary | ICD-10-CM | POA: Diagnosis not present

## 2022-05-02 ENCOUNTER — Telehealth: Payer: Self-pay | Admitting: Podiatry

## 2022-05-02 NOTE — Telephone Encounter (Signed)
DOS: 06/01/2022  [employer] Effective 12/02/2021 Medicaid [place] Access  Repair Post Tibial Tendon Lt [phone]) Gastrocnemius Recess Lt [phone]) Tenodesis Lt [phone]) Cotton Tarsal Osteotomy Lt [phone]) Evans Calcaneal Osteotomy Lt [phone]) Repair Secondary Disrupted Ligament Lt ([id number])  Deductible: $0 Out-of-Pocket: $8,300 with $6,195.09 remaining CoInsurance: 0%  The following codes do not require a pre-authorization All services are subject to members benefits, exclusions,  limitations and other applicable conditions. Created on 05/02/2022 Service info [id number] Repair, secondary, disrupted ligament, ankle, collateral (eg, Watson-Jones procedure) [phone] Gastrocnemius recession (eg, Strayer procedure) [phone] Transfer or transplant of single tendon (with muscle redirection or rerouting); deep (eg, anterior tibial or posterior  tibial through interosseous space, flexor digitorum longus, flexor hallucis longus, or peroneal tendon to midfoot  or hindfoot) 82505 Osteotomy, tarsal bones, other than calcaneus or talus; 28300 Osteotomy; calcaneus (eg, Dwyer or Chambers type procedure), with or without internal fixation [id number] Repair, flexor tendon, leg; secondary, with or without graft, each tendon

## 2022-05-09 ENCOUNTER — Ambulatory Visit: Payer: Medicare HMO | Admitting: Urology

## 2022-05-10 DIAGNOSIS — Z01818 Encounter for other preprocedural examination: Secondary | ICD-10-CM | POA: Diagnosis not present

## 2022-05-15 NOTE — Progress Notes (Unsigned)
05/16/2022 10:32 AM   Lindsey Wolf Oct 21, 1965 [id number]  Referring provider: Tracie Harrier, MD [address] [facility] [place],  [place] [address]  Urological history: 1. OAB -Contributing factors of age, depression, anxiety, alcohol consumption and smoking -PVR 13 mL  2. Urge incontinence -Contributing factors of age, depression, anxiety, alcohol consumption and smoking -PVR 13 mL  3. Stress incontinence -Contributing factors of age, depression, anxiety, alcohol consumption and smoking -PVR 13 mL   Chief Complaint  Patient presents with   Over Active Bladder    HPI: Lindsey Wolf is a 56 y.o. female who presents today for 23-monthtrial of Gemtesa 75 mg daily.  At her visit on 04/11/2022, she indicates 1-7 daytime urinations, 1-2 nocturia and a mild urge to urinate.  She has issues with stress incontinence.  She leaks 1-2 times weekly.  She wears a panty liner daily.  PVR 13 mL  She states that the oxybutynin XL 10 mg daily was helpful in reducing her incontinent episodes, but the dry mouth was intolerable.  She was also doing her Kegel exercises at home and felt that they were helpful for her stress urinary incontinence.  She was given 1 month samples of Gemtesa 75 mg daily and scheduled for follow-up.  She is having 1-7 daytime voids, nocturia x 1-2 with a mild urge to urinate.  She is not experiencing leakage.  [facility] been [facility] to hold her urine for a longer period of time and the urgency is also milder.  Patient denies any modifying or aggravating factors.  Patient denies any gross hematuria, dysuria or suprapubic/flank pain.  Patient denies any fevers, chills, nausea or vomiting.    PVR 161 mL                                                                                                             PMH: Past Medical History:  Diagnosis Date   Abnormal respirations    Abscess of abdominal cavity (HCC)     Anxiety    Depression    Empyema of left pleural space (HCC)    GERD (gastroesophageal reflux disease)    Headache    Hypokalemia    Perforated ulcer (HCC)    Pleural effusion    Protein-calorie malnutrition, severe 08/17/2016   Respiratory distress     Surgical History: Past Surgical History:  Procedure Laterality Date   ABDOMINAL WALL DEFECT REPAIR N/A 11/21/2017   Procedure: REPAIR ABDOMINAL WALL, ABDOMINAL WALL RECONSTRUCTION with mesh;  Surgeon: PJules Husbands MD;  Location: [facility] ORS;  Service: General;  Laterality: N/A;   APPLICATION OF WOUND VAC N/A 08/12/2016   Procedure: ,remove 2  JP drains,add one pennrose;  Surgeon: RFlorene Glen MD;  Location: [facility] ORS;  Service: General;  Laterality: N/A;   CHEST TUBE INSERTION Right 08/08/2016   Procedure: CHEST TUBE INSERTION;  Surgeon: TNestor Lewandowsky MD;  Location: [facility] ORS;  Service: General;  Laterality: Right;   COLONOSCOPY WITH PROPOFOL N/A 04/29/2018  Procedure: COLONOSCOPY WITH PROPOFOL;  Surgeon: Lollie Sails, MD;  Location: [facility] ENDOSCOPY;  Service: Endoscopy;  Laterality: N/A;   DEBRIDEMENT OF ABDOMINAL WALL ABSCESS N/A 08/08/2016   Procedure: DEBRIDEMENT OF ABDOMINAL WALL ABSCESS;  Surgeon: Florene Glen, MD;  Location: [facility] ORS;  Service: General;  Laterality: N/A;   DIAGNOSTIC LAPAROSCOPY     ESOPHAGOGASTRODUODENOSCOPY (EGD) WITH PROPOFOL N/A 02/19/2017   Procedure: ESOPHAGOGASTRODUODENOSCOPY (EGD) WITH PROPOFOL;  Surgeon: Jonathon Bellows, MD;  Location: [facility] ENDOSCOPY;  Service: Gastroenterology;  Laterality: N/A;   ESOPHAGOGASTRODUODENOSCOPY (EGD) WITH PROPOFOL N/A 01/20/2018   Procedure: ESOPHAGOGASTRODUODENOSCOPY (EGD) WITH PROPOFOL;  Surgeon: Lollie Sails, MD;  Location: [facility] ENDOSCOPY;  Service: Endoscopy;  Laterality: N/A;   LAPAROTOMY N/A 07/20/2016   Procedure: EXPLORATORY LAPAROTOMY;  Surgeon: Jules Husbands, MD;  Location: [facility] ORS;  Service: General;  Laterality: N/A;   LAPAROTOMY N/A 07/30/2016    Procedure: EXPLORATORY LAPAROTOMY drainage peritoneal abscess;  Surgeon: Clayburn Pert, MD;  Location: [facility] ORS;  Service: General;  Laterality: N/A;   LYSIS OF ADHESION  07/30/2016   Procedure: LYSIS OF ADHESION;  Surgeon: Clayburn Pert, MD;  Location: [facility] ORS;  Service: General;;   TONSILLECTOMY     VIDEO ASSISTED THORACOSCOPY (VATS)/THOROCOTOMY Left 08/08/2016   Procedure: VIDEO ASSISTED THORACOSCOPY (VATS)/THOROCOTOMY Possible Thoracotomy;  Surgeon: [name], MD;  Location: [facility] ORS;  Service: General;  Laterality: Left;    Home Medications:  Allergies as of 05/16/2022   No Known Allergies      Medication List        Accurate as of May 16, 2022 10:32 AM. If you have any questions, ask your nurse or doctor.          STOP taking these medications    ARIPiprazole 5 MG tablet Commonly known as: ABILIFY   HYDROcodone-acetaminophen 5-325 MG tablet Commonly known as: NORCO/VICODIN   Maalox Max 400-400-40 MG/5ML suspension Generic drug: alum & mag hydroxide-simeth   topiramate 100 MG tablet Commonly known as: TOPAMAX   varenicline 0.5 MG tablet Commonly known as: CHANTIX       TAKE these medications    albuterol 108 (90 Base) MCG/ACT inhaler Commonly known as: VENTOLIN HFA Inhale 2 puffs into the lungs every 6 (six) hours as needed for wheezing or shortness of breath.   clonazePAM 1 MG tablet Commonly known as: KLONOPIN Take by mouth.   Gemtesa 75 MG Tabs Generic drug: Vibegron Take 75 mg by mouth daily.   pantoprazole 40 MG tablet Commonly known as: PROTONIX TAKE 1 TABLET BY MOUTH TWICE DAILY   rosuvastatin 5 MG tablet Commonly known as: CRESTOR Take 5 mg by mouth daily.   traZODone 50 MG tablet Commonly known as: DESYREL Take 25-100 mg by mouth at bedtime.   Trelegy Ellipta 100-62.5-25 MCG/ACT Aepb Generic drug: Fluticasone-Umeclidin-Vilant Inhale 1 puff into the lungs daily.   Trintellix 20 MG Tabs tablet Generic drug: vortioxetine  HBr Take 20 mg by mouth daily.        Allergies: No Known Allergies  Family History: Family History  Problem Relation Age of Onset   Cancer Mother        Lung   COPD Mother    Emphysema Mother    Neuropathy Mother    Anxiety disorder Mother    Depression Mother    Heart disease Father 61       Heart Attack   Cancer Maternal Grandmother        Doesn't remember   Emphysema Maternal Aunt  Cancer Other        Doesn't remember   Breast cancer Neg Hx     Social History:  reports that she quit smoking about 5 months ago. Her smoking use included cigarettes. She has a 11.00 pack-year smoking history. She has never used smokeless tobacco. She reports current alcohol use. She reports that she does not use drugs.  ROS: Pertinent ROS in HPI  Physical Exam: BP (!) 139/93   Pulse 99   Ht '5\' 9"'$  (1.753 m)   Wt 184 lb (83.5 kg)   LMP 02/19/2005   BMI 27.17 kg/m   Constitutional:  Well nourished. Alert and oriented, No acute distress. HEENT: [place] AT, moist mucus membranes.  Trachea midline Cardiovascular: No clubbing, cyanosis, or edema. Respiratory: Normal respiratory effort, no increased work of breathing. Neurologic: Grossly intact, no focal deficits, moving all 4 extremities. Psychiatric: Normal mood and affect.    Laboratory Data: N/A  Pertinent Imaging:  05/16/22 10:20  Scan Result 143m      Assessment & Plan:    1. OAB -at goal w/ Gemtesa 75 mg daily -I have sent in a prescription for her to her pharmacy, but likely we will need to obtain prior authorization so have also given her samples to supplement while we are obtaining that from her insurance  2. Urge incontinence -see # 1  3. SUI -continue Kegel exercises at home   Return in about 6 months (around 11/15/2022) for PVR and OAB questionnaire.  These notes generated with voice recognition software. I apologize for typographical errors.  [place] [address]orn[address] [place] [phone]

## 2022-05-16 ENCOUNTER — Encounter: Payer: Self-pay | Admitting: Urology

## 2022-05-16 ENCOUNTER — Ambulatory Visit (INDEPENDENT_AMBULATORY_CARE_PROVIDER_SITE_OTHER): Payer: Medicare HMO | Admitting: Urology

## 2022-05-16 VITALS — BP 139/93 | HR 99 | Ht 69.0 in | Wt 184.0 lb

## 2022-05-16 DIAGNOSIS — N3281 Overactive bladder: Secondary | ICD-10-CM | POA: Diagnosis not present

## 2022-05-16 DIAGNOSIS — N393 Stress incontinence (female) (male): Secondary | ICD-10-CM

## 2022-05-16 DIAGNOSIS — N3946 Mixed incontinence: Secondary | ICD-10-CM

## 2022-05-16 DIAGNOSIS — N3941 Urge incontinence: Secondary | ICD-10-CM

## 2022-05-16 LAB — BLADDER SCAN AMB NON-IMAGING

## 2022-05-16 MED ORDER — GEMTESA 75 MG PO TABS: 75.0000 mg | ORAL_TABLET | Freq: Every day | ORAL | 3 refills | Status: AC

## 2022-05-23 ENCOUNTER — Encounter
Admission: RE | Admit: 2022-05-23 | Discharge: 2022-05-23 | Disposition: A | Discharge status: Home / Self Care | Payer: Medicare HMO | Source: Ambulatory Visit | Attending: Podiatry | Admitting: Podiatry

## 2022-05-23 HISTORY — DX: Unspecified osteoarthritis, unspecified site: M19.90

## 2022-05-23 HISTORY — DX: Atherosclerosis of aorta: I70.0

## 2022-05-23 HISTORY — DX: Anemia, unspecified: D64.9

## 2022-05-23 NOTE — Patient Instructions (Addendum)
Your procedure is scheduled on:06-01-22 Friday Report to the Registration Desk on the 1st floor of the [facility].Then proceed to the 2nd floor Surgery Desk To find out your arrival time, please call [phone] between 1PM - 3PM on:05-31-22 Thursday If your arrival time is 6:00 am, do not arrive prior to that time as the [facility] entrance doors do not open until 6:00 am.  REMEMBER: Instructions that are not followed completely may result in serious medical risk, up to and including death; or upon the discretion of your surgeon and anesthesiologist your surgery may need to be rescheduled.  Do not eat food after midnight the night before surgery.  No gum chewing, lozengers or hard candies.  You may however, drink CLEAR liquids up to 2 hours before you are scheduled to arrive for your surgery. Do not drink anything within 2 hours of your scheduled arrival time.  Clear liquids include: - water  - apple juice without pulp - gatorade (not RED colors) - black coffee or tea (Do NOT add milk or creamers to the coffee or tea) Do NOT drink anything that is not on this list.  TAKE THESE MEDICATIONS THE MORNING OF SURGERY WITH A SIP OF WATER: -ALPRAZolam (XANAX)  -pantoprazole (PROTONIX)   Use your Albuterol Inhaler the day of surgery and bring your Albuterol Inhaler to the hospital  One week prior to surgery: Stop Anti-inflammatories (NSAIDS) such as Advil, Aleve, Ibuprofen, Motrin, Naproxen, Naprosyn and Aspirin based products such as Excedrin, Goodys Powder, BC Powder.You may however, continue to take Tylenol if needed for pain up until the day of surgery.  Stop ANY OVER THE COUNTER supplements/vitamins NOW 7 days prior to surgery (Citracal and B12)  No Alcohol for 24 hours before or after surgery.  No Smoking including e-cigarettes for 24 hours prior to surgery.  No chewable tobacco products for at least 6 hours prior to surgery.  No nicotine patches on the day of surgery.  Do  not use any "recreational" drugs for at least a week prior to your surgery.  Please be advised that the combination of cocaine and anesthesia may have negative outcomes, up to and including death. If you test positive for cocaine, your surgery will be cancelled.  On the morning of surgery brush your teeth with toothpaste and water, you may rinse your mouth with mouthwash if you wish. Do not swallow any toothpaste or mouthwash.  Use CHG Soap as directed on instruction sheet.  Do not wear jewelry, make-up, hairpins, clips or nail polish.  Do not wear lotions, powders, or perfumes.   Do not shave body from the neck down 48 hours prior to surgery just in case you cut yourself which could leave a site for infection.  Also, freshly shaved skin may become irritated if using the CHG soap.  Contact lenses, hearing aids and dentures may not be worn into surgery.  Do not bring valuables to the hospital. [facility] is not responsible for any missing/lost belongings or valuables.   Notify your doctor if there is any change in your medical condition (cold, fever, infection).  Wear comfortable clothing (specific to your surgery type) to the hospital.  After surgery, you can help prevent lung complications by doing breathing exercises.  Take deep breaths and cough every 1-2 hours. Your doctor may order a device called an Incentive Spirometer to help you take deep breaths. When coughing or sneezing, hold a pillow firmly against your incision with both hands. This is called "splinting."  Doing this helps protect your incision. It also decreases belly discomfort.  If you are being admitted to the hospital overnight, leave your suitcase in the car. After surgery it may be brought to your room.  If you are being discharged the day of surgery, you will not be allowed to drive home. You will need a responsible adult (18 years or older) to drive you home and stay with you that night.   If you are taking  public transportation, you will need to have a responsible adult (18 years or older) with you. Please confirm with your physician that it is acceptable to use public transportation.   Please call the [facility] Dept. at [phone] if you have any questions about these instructions.  Surgery Visitation Policy:  Patients undergoing a surgery or procedure may have two family members or support persons with them as long as the person is not COVID-19 positive or experiencing its symptoms.   Due to an increase in RSV and influenza rates and associated hospitalizations, children ages 13 and under will not be able to visit patients in [facility]. Masks continue to be strongly recommended.

## 2022-06-01 ENCOUNTER — Ambulatory Visit: Payer: Medicare HMO | Admitting: Anesthesiology

## 2022-06-01 ENCOUNTER — Encounter: Payer: Self-pay | Admitting: Podiatry

## 2022-06-01 ENCOUNTER — Ambulatory Visit: Payer: Medicare HMO

## 2022-06-01 ENCOUNTER — Other Ambulatory Visit: Payer: Self-pay

## 2022-06-01 ENCOUNTER — Encounter
Admission: RE | Disposition: A | Discharge status: Home / Self Care | Payer: Self-pay | Source: Home / Self Care | Attending: Podiatry

## 2022-06-01 ENCOUNTER — Ambulatory Visit
Admission: RE | Admit: 2022-06-01 | Discharge: 2022-06-01 | Disposition: A | Discharge status: Home / Self Care | Payer: Medicare HMO | Attending: Podiatry | Admitting: Podiatry

## 2022-06-01 DIAGNOSIS — Z01818 Encounter for other preprocedural examination: Secondary | ICD-10-CM | POA: Diagnosis not present

## 2022-06-01 DIAGNOSIS — X58XXXA Exposure to other specified factors, initial encounter: Secondary | ICD-10-CM | POA: Diagnosis not present

## 2022-06-01 DIAGNOSIS — M66872 Spontaneous rupture of other tendons, left ankle and foot: Secondary | ICD-10-CM

## 2022-06-01 DIAGNOSIS — S86112A Strain of other muscle(s) and tendon(s) of posterior muscle group at lower leg level, left leg, initial encounter: Secondary | ICD-10-CM | POA: Diagnosis not present

## 2022-06-01 DIAGNOSIS — M21962 Unspecified acquired deformity of left lower leg: Secondary | ICD-10-CM | POA: Diagnosis not present

## 2022-06-01 DIAGNOSIS — Z87891 Personal history of nicotine dependence: Secondary | ICD-10-CM | POA: Diagnosis not present

## 2022-06-01 DIAGNOSIS — J439 Emphysema, unspecified: Secondary | ICD-10-CM | POA: Insufficient documentation

## 2022-06-01 DIAGNOSIS — M65272 Calcific tendinitis, left ankle and foot: Secondary | ICD-10-CM | POA: Diagnosis not present

## 2022-06-01 DIAGNOSIS — I700 Atherosclerosis of aorta: Secondary | ICD-10-CM | POA: Insufficient documentation

## 2022-06-01 DIAGNOSIS — M216X2 Other acquired deformities of left foot: Secondary | ICD-10-CM

## 2022-06-01 DIAGNOSIS — F17210 Nicotine dependence, cigarettes, uncomplicated: Secondary | ICD-10-CM | POA: Insufficient documentation

## 2022-06-01 DIAGNOSIS — Z8719 Personal history of other diseases of the digestive system: Secondary | ICD-10-CM | POA: Insufficient documentation

## 2022-06-01 DIAGNOSIS — Z981 Arthrodesis status: Secondary | ICD-10-CM | POA: Diagnosis not present

## 2022-06-01 DIAGNOSIS — S93692A Other sprain of left foot, initial encounter: Secondary | ICD-10-CM | POA: Diagnosis not present

## 2022-06-01 DIAGNOSIS — R2681 Unsteadiness on feet: Secondary | ICD-10-CM | POA: Insufficient documentation

## 2022-06-01 DIAGNOSIS — M62462 Contracture of muscle, left lower leg: Secondary | ICD-10-CM | POA: Insufficient documentation

## 2022-06-01 DIAGNOSIS — M2142 Flat foot [pes planus] (acquired), left foot: Secondary | ICD-10-CM | POA: Diagnosis not present

## 2022-06-01 DIAGNOSIS — F418 Other specified anxiety disorders: Secondary | ICD-10-CM | POA: Diagnosis not present

## 2022-06-01 DIAGNOSIS — M2140 Flat foot [pes planus] (acquired), unspecified foot: Secondary | ICD-10-CM | POA: Diagnosis not present

## 2022-06-01 DIAGNOSIS — Z8711 Personal history of peptic ulcer disease: Secondary | ICD-10-CM | POA: Insufficient documentation

## 2022-06-01 DIAGNOSIS — G8918 Other acute postprocedural pain: Secondary | ICD-10-CM | POA: Diagnosis not present

## 2022-06-01 DIAGNOSIS — E785 Hyperlipidemia, unspecified: Secondary | ICD-10-CM | POA: Insufficient documentation

## 2022-06-01 DIAGNOSIS — M7989 Other specified soft tissue disorders: Secondary | ICD-10-CM | POA: Diagnosis not present

## 2022-06-01 HISTORY — PX: CALCANEAL OSTEOTOMY: SHX1281

## 2022-06-01 HISTORY — PX: TIBIALIS TENDON TRANSFER / REPAIR: SHX6630

## 2022-06-01 HISTORY — PX: FLAT FOOT RECONSTRUCTION-TAL GASTROC RECESSION: SHX6620

## 2022-06-01 SURGERY — FLAT FOOT RECONSTRUCTION-TAL GASTROC RECESSION
Anesthesia: General | Laterality: Left

## 2022-06-01 MED ORDER — BUPIVACAINE HCL (PF) 0.5 % IJ SOLN
INTRAMUSCULAR | Status: AC
  Filled 2022-06-01: qty 10

## 2022-06-01 MED ORDER — MIDAZOLAM HCL 2 MG/2ML IJ SOLN
2.0000 mg | Freq: Once | INTRAMUSCULAR | Status: AC
  Administered 2022-06-01: 2 mg via INTRAVENOUS

## 2022-06-01 MED ORDER — FENTANYL CITRATE (PF) 100 MCG/2ML IJ SOLN
INTRAMUSCULAR | Status: AC
  Filled 2022-06-01: qty 2

## 2022-06-01 MED ORDER — SUGAMMADEX SODIUM 200 MG/2ML IV SOLN
INTRAVENOUS | Status: DC | PRN
  Administered 2022-06-01: 200 mg via INTRAVENOUS

## 2022-06-01 MED ORDER — MIDAZOLAM HCL 2 MG/2ML IJ SOLN: INTRAMUSCULAR | Status: DC | PRN

## 2022-06-01 MED ORDER — LIDOCAINE HCL (PF) 1 % IJ SOLN
INTRAMUSCULAR | Status: AC
  Filled 2022-06-01: qty 5

## 2022-06-01 MED ORDER — LIDOCAINE HCL (PF) 1 % IJ SOLN
INTRAMUSCULAR | Status: DC | PRN
  Administered 2022-06-01: 3 mL via SUBCUTANEOUS

## 2022-06-01 MED ORDER — DEXAMETHASONE SODIUM PHOSPHATE 10 MG/ML IJ SOLN
INTRAMUSCULAR | Status: DC | PRN
  Administered 2022-06-01: 20 mg via INTRAVENOUS

## 2022-06-01 MED ORDER — PROPOFOL 500 MG/50ML IV EMUL
INTRAVENOUS | Status: DC | PRN
  Administered 2022-06-01: 150 ug/kg/min via INTRAVENOUS

## 2022-06-01 MED ORDER — BUPIVACAINE-EPINEPHRINE (PF) 0.5% -1:200000 IJ SOLN
INTRAMUSCULAR | Status: AC
  Filled 2022-06-01: qty 30

## 2022-06-01 MED ORDER — MIDAZOLAM HCL 2 MG/2ML IJ SOLN
INTRAMUSCULAR | Status: AC
  Filled 2022-06-01: qty 2

## 2022-06-01 MED ORDER — ROCURONIUM BROMIDE 100 MG/10ML IV SOLN
INTRAVENOUS | Status: DC | PRN
  Administered 2022-06-01: 20 mg via INTRAVENOUS
  Administered 2022-06-01: 10 mg via INTRAVENOUS
  Administered 2022-06-01: 50 mg via INTRAVENOUS
  Administered 2022-06-01: 10 mg via INTRAVENOUS

## 2022-06-01 MED ORDER — ORAL CARE MOUTH RINSE: 15.0000 mL | Freq: Once | OROMUCOSAL | Status: AC

## 2022-06-01 MED ORDER — LIDOCAINE HCL (CARDIAC) PF 100 MG/5ML IV SOSY
PREFILLED_SYRINGE | INTRAVENOUS | Status: DC | PRN
  Administered 2022-06-01: 100 mg via INTRAVENOUS

## 2022-06-01 MED ORDER — ONDANSETRON HCL 4 MG/2ML IJ SOLN
INTRAMUSCULAR | Status: DC | PRN
  Administered 2022-06-01: 4 mg via INTRAVENOUS

## 2022-06-01 MED ORDER — CEFAZOLIN SODIUM-DEXTROSE 2-4 GM/100ML-% IV SOLN
INTRAVENOUS | Status: AC
  Filled 2022-06-01: qty 100

## 2022-06-01 MED ORDER — LIDOCAINE HCL (PF) 2 % IJ SOLN
INTRAMUSCULAR | Status: AC
  Filled 2022-06-01: qty 5

## 2022-06-01 MED ORDER — RIVAROXABAN 10 MG PO TABS: 10.0000 mg | ORAL_TABLET | Freq: Every day | ORAL | 0 refills | Status: AC

## 2022-06-01 MED ORDER — CHLORHEXIDINE GLUCONATE 0.12 % MT SOLN: 15.0000 mL | Freq: Once | OROMUCOSAL | Status: AC

## 2022-06-01 MED ORDER — MIDAZOLAM HCL 2 MG/2ML IJ SOLN
INTRAMUSCULAR | Status: DC | PRN
  Administered 2022-06-01: 1 mg via INTRAVENOUS

## 2022-06-01 MED ORDER — OXYCODONE HCL 5 MG PO TABS: 5.0000 mg | ORAL_TABLET | ORAL | 0 refills | Status: AC | PRN

## 2022-06-01 MED ORDER — ACETAMINOPHEN 500 MG PO TABS: 1000.0000 mg | ORAL_TABLET | Freq: Four times a day (QID) | ORAL | 0 refills | Status: AC | PRN

## 2022-06-01 MED ORDER — KETOROLAC TROMETHAMINE 30 MG/ML IJ SOLN
INTRAMUSCULAR | Status: AC
  Filled 2022-06-01: qty 1

## 2022-06-01 MED ORDER — PROPOFOL 10 MG/ML IV BOLUS
INTRAVENOUS | Status: AC
  Filled 2022-06-01: qty 20

## 2022-06-01 MED ORDER — POVIDONE-IODINE 10 % EX SWAB: 2.0000 | Freq: Once | CUTANEOUS | Status: DC

## 2022-06-01 MED ORDER — BUPIVACAINE HCL (PF) 0.5 % IJ SOLN
INTRAMUSCULAR | Status: DC | PRN
  Administered 2022-06-01: 20 mL via PERINEURAL

## 2022-06-01 MED ORDER — DEXMEDETOMIDINE HCL IN NACL 80 MCG/20ML IV SOLN
INTRAVENOUS | Status: DC | PRN
  Administered 2022-06-01: 16 ug via BUCCAL
  Administered 2022-06-01: 12 ug via BUCCAL

## 2022-06-01 MED ORDER — FENTANYL CITRATE (PF) 100 MCG/2ML IJ SOLN
25.0000 ug | INTRAMUSCULAR | Status: DC | PRN
  Administered 2022-06-01 (×4): 25 ug via INTRAVENOUS

## 2022-06-01 MED ORDER — ESMOLOL HCL 100 MG/10ML IV SOLN
INTRAVENOUS | Status: AC
  Filled 2022-06-01: qty 10

## 2022-06-01 MED ORDER — CEFAZOLIN SODIUM-DEXTROSE 2-4 GM/100ML-% IV SOLN
2.0000 g | INTRAVENOUS | Status: AC
  Administered 2022-06-01: 2 g via INTRAVENOUS

## 2022-06-01 MED ORDER — ACETAMINOPHEN 10 MG/ML IV SOLN
INTRAVENOUS | Status: AC
  Filled 2022-06-01: qty 100

## 2022-06-01 MED ORDER — FENTANYL CITRATE (PF) 100 MCG/2ML IJ SOLN
INTRAMUSCULAR | Status: AC
  Administered 2022-06-01: 25 ug via INTRAVENOUS
  Filled 2022-06-01: qty 2

## 2022-06-01 MED ORDER — ACETAMINOPHEN 10 MG/ML IV SOLN
INTRAVENOUS | Status: DC | PRN
  Administered 2022-06-01: 1000 mg via INTRAVENOUS

## 2022-06-01 MED ORDER — LACTATED RINGERS IV SOLN: INTRAVENOUS | Status: DC

## 2022-06-01 MED ORDER — DEXMEDETOMIDINE HCL IN NACL 80 MCG/20ML IV SOLN
INTRAVENOUS | Status: AC
  Filled 2022-06-01: qty 20

## 2022-06-01 MED ORDER — LIDOCAINE-EPINEPHRINE 1 %-1:100000 IJ SOLN
INTRAMUSCULAR | Status: AC
  Filled 2022-06-01: qty 1

## 2022-06-01 MED ORDER — LIDOCAINE HCL (PF) 1 % IJ SOLN
INTRAMUSCULAR | Status: AC
  Filled 2022-06-01: qty 30

## 2022-06-01 MED ORDER — PROPOFOL 500 MG/50ML IV EMUL: INTRAVENOUS | Status: DC | PRN

## 2022-06-01 MED ORDER — PROPOFOL 10 MG/ML IV BOLUS
INTRAVENOUS | Status: DC | PRN
  Administered 2022-06-01: 200 mg via INTRAVENOUS
  Administered 2022-06-01: 100 mg via INTRAVENOUS

## 2022-06-01 MED ORDER — ONDANSETRON HCL 4 MG/2ML IJ SOLN: 4.0000 mg | Freq: Once | INTRAMUSCULAR | Status: DC | PRN

## 2022-06-01 MED ORDER — KETOROLAC TROMETHAMINE 30 MG/ML IJ SOLN
INTRAMUSCULAR | Status: DC | PRN
  Administered 2022-06-01: 30 mg via INTRAVENOUS

## 2022-06-01 MED ORDER — PROPOFOL 1000 MG/100ML IV EMUL
INTRAVENOUS | Status: AC
  Filled 2022-06-01: qty 100

## 2022-06-01 MED ORDER — GABAPENTIN 300 MG PO CAPS: 300.0000 mg | ORAL_CAPSULE | Freq: Three times a day (TID) | ORAL | 0 refills | Status: AC

## 2022-06-01 MED ORDER — CHLORHEXIDINE GLUCONATE 0.12 % MT SOLN
OROMUCOSAL | Status: AC
  Administered 2022-06-01: 15 mL via OROMUCOSAL
  Filled 2022-06-01: qty 15

## 2022-06-01 MED ORDER — LIDOCAINE HCL (PF) 1 % IJ SOLN
INTRAMUSCULAR | Status: DC | PRN
  Administered 2022-06-01: 16 mL

## 2022-06-01 MED ORDER — SODIUM CHLORIDE 0.9 % IR SOLN
Status: DC | PRN
  Administered 2022-06-01: 100 mL

## 2022-06-01 MED ORDER — BUPIVACAINE HCL (PF) 0.5 % IJ SOLN
INTRAMUSCULAR | Status: AC
  Filled 2022-06-01: qty 30

## 2022-06-01 MED ORDER — FENTANYL CITRATE (PF) 100 MCG/2ML IJ SOLN
INTRAMUSCULAR | Status: DC | PRN
  Administered 2022-06-01: 25 ug via INTRAVENOUS
  Administered 2022-06-01: 50 ug via INTRAVENOUS
  Administered 2022-06-01 (×3): 25 ug via INTRAVENOUS
  Administered 2022-06-01: 50 ug via INTRAVENOUS

## 2022-06-01 MED ORDER — 0.9 % SODIUM CHLORIDE (POUR BTL) OPTIME
TOPICAL | Status: DC | PRN
  Administered 2022-06-01: 500 mL

## 2022-06-01 SURGICAL SUPPLY — 58 items
BASIN GRAD PLASTIC 32OZ STRL (MISCELLANEOUS) ×3 IMPLANT
BIT DRILL CANN STRT 3.2X5 (BIT) ×1 IMPLANT
BIT DRILL PROFILE 5X5 LG (DRILL) ×1 IMPLANT
BLADE MED AGGRESSIVE (BLADE) ×3 IMPLANT
BLADE SURG MINI STRL (BLADE) ×3 IMPLANT
BNDG CMPR 5X4 CHSV STRCH STRL (GAUZE/BANDAGES/DRESSINGS) ×3
BNDG COHESIVE 4X5 TAN STRL LF (GAUZE/BANDAGES/DRESSINGS) ×3 IMPLANT
BNDG ESMARK 4X12 TAN STRL LF (GAUZE/BANDAGES/DRESSINGS) ×3 IMPLANT
BNDG GAUZE DERMACEA FLUFF 4 (GAUZE/BANDAGES/DRESSINGS) ×3 IMPLANT
BNDG GZE DERMACEA 4 6PLY (GAUZE/BANDAGES/DRESSINGS) ×3
BUR MIS STRT 3.1X20 (BUR) ×1 IMPLANT
BURR MIS STRT 3.1X20 (BUR) ×3
BURR SHAVER STRAIGHT 3.1X20 (BUR) ×3
COVER PIN YLW 0.028-062 (MISCELLANEOUS) ×4 IMPLANT
DRAPE C-ARMOR (DRAPES) ×1 IMPLANT
DRAPE FLUOR MINI C-ARM 54X84 (DRAPES) ×3 IMPLANT
DRILL PROFILE 5X5 LG (DRILL) ×3
DRSG TEGADERM 4X4.75 (GAUZE/BANDAGES/DRESSINGS) ×3 IMPLANT
DURAPREP 26ML APPLICATOR (WOUND CARE) ×3 IMPLANT
ELECT REM PT RETURN 9FT ADLT (ELECTROSURGICAL) ×3
ELECTRODE REM PT RTRN 9FT ADLT (ELECTROSURGICAL) ×3 IMPLANT
GAUZE SPONGE 4X4 12PLY STRL (GAUZE/BANDAGES/DRESSINGS) ×3 IMPLANT
GAUZE XEROFORM 1X8 LF (GAUZE/BANDAGES/DRESSINGS) ×3 IMPLANT
GLOVE BIO SURGEON STRL SZ7 (GLOVE) ×3 IMPLANT
GLOVE BIOGEL PI IND STRL 7.0 (GLOVE) ×3 IMPLANT
GOWN STRL REUS W/ TWL LRG LVL3 (GOWN DISPOSABLE) ×3 IMPLANT
GOWN STRL REUS W/TWL LRG LVL3 (GOWN DISPOSABLE) ×3
GUIDEWIRE TROC TIP .062X9.25 (WIRE) ×3 IMPLANT
IMPL INTERNAL BRACE BIO (Anchor) ×1 IMPLANT
IMPLANT INTERNAL BRACE BIO (Anchor) ×3 IMPLANT
KIT GASTROC RECESSION INST (INSTRUMENTS) ×1 IMPLANT
KIT TURNOVER KIT A (KITS) ×3 IMPLANT
MANIFOLD NEPTUNE II (INSTRUMENTS) ×3 IMPLANT
NDL FILTER BLUNT 18X1 1/2 (NEEDLE) ×2 IMPLANT
NEEDLE FILTER BLUNT 18X1 1/2 (NEEDLE) ×3 IMPLANT
NS IRRIG 500ML POUR BTL (IV SOLUTION) ×3 IMPLANT
PACK EXTREMITY ARMC (MISCELLANEOUS) ×3 IMPLANT
PENCIL SMOKE EVACUATOR (MISCELLANEOUS) ×3 IMPLANT
SCREW LOW COMP HEADLESS 5.0X46 (Screw) ×1 IMPLANT
SCREW LOW COMP HEADLESS 5.0X50 (Screw) ×1 IMPLANT
SET IRRIGATION TUBING (TUBING) ×1 IMPLANT
SPLINT CAST 1 STEP 4X30 (MISCELLANEOUS) ×2 IMPLANT
SPLINT PLASTER CAST FAST 5X30 (CAST SUPPLIES) ×3 IMPLANT
STOCKINETTE STRL 6IN 960660 (GAUZE/BANDAGES/DRESSINGS) ×3 IMPLANT
STRIP CLOSURE SKIN 1/4X4 (GAUZE/BANDAGES/DRESSINGS) ×3 IMPLANT
SUT ETHILON 4-0 (SUTURE) ×3
SUT ETHILON 4-0 FS2 18XMFL BLK (SUTURE) ×3
SUT VIC AB 2-0 CT1 (SUTURE) ×1 IMPLANT
SUT VIC AB 3-0 SH 27 (SUTURE) ×3
SUT VIC AB 3-0 SH 27X BRD (SUTURE) ×1 IMPLANT
SUT VIC AB 4-0 FS2 27 (SUTURE) ×6 IMPLANT
SUT VICRYL AB 3-0 FS1 BRD 27IN (SUTURE) ×3 IMPLANT
SUTURE ETHLN 4-0 FS2 18XMF BLK (SUTURE) ×3 IMPLANT
SYR 10ML LL (SYRINGE) ×6 IMPLANT
SYR 3ML LL SCALE MARK (SYRINGE) ×3 IMPLANT
SYSTEM IMPLANT FDL 4.75 (Anchor) ×1 IMPLANT
TRAP FLUID SMOKE EVACUATOR (MISCELLANEOUS) ×3 IMPLANT
WATER STERILE IRR 500ML POUR (IV SOLUTION) ×3 IMPLANT

## 2022-06-01 NOTE — Op Note (Signed)
Patient Name: Lindsey Wolf DOB: 21-Aug-1965  MRN: [id number]   Date of Service: 06/01/2022  Surgeon: Dr. Lanae Crumbly, DPM Assistants: None Pre-operative Diagnosis:  Pes planus deformity Posterior tibial tendon tear Calcaneal valgus Gastrocnemius equinus  Post-operative Diagnosis:  Pes planus deformity 2. posterior tibial tendon tear 3. Calcaneal valgus 4.  Gastrocnemius equinus 5. Spring ligament insufficiency Procedures:  1) calcaneal osteotomy medial displacement  2) gastrocnemius recession  3) flexor digitorum longus transfer to navicular  4) excision of posterior tibial tendon  5) augmentation and repair of the spring ligament Pathology/Specimens: ID Type Source Tests Collected by Time Destination  1 : Left posterior tibial tendon Tissue PATH Other SURGICAL PATHOLOGY [name], [facility] 06/01/2022 0930    Anesthesia: General Hemostasis:  Total Tourniquet Time Documented: Thigh (Left) - 117 minutes Total: Thigh (Left) - 117 minutes  Estimated Blood Loss: 20 mL Materials:  Implant Name Type Inv. Item Serial No. Manufacturer Lot No. LRB No. Used Action  IMPLANT INTERNAL BRACE BIO - [id number] Anchor IMPLANT INTERNAL BRACE BIO  [employer] [id number] Left 1 Implanted  SYSTEM IMPLANT FDL 4.75 - [id number] Anchor SYSTEM IMPLANT FDL 4.75  [employer] [id number] Left 1 Implanted  SCREW LOW COMP HEADLESS 5.0X50 - [id number] Screw SCREW LOW COMP HEADLESS 5.0X50  [employer]  Left 1 Implanted  5.0 x 46 Headless screw    [employer]  Left 1 Implanted   Medications: 16 cc 1% lidocaine plain Complications: No complication noted  Indications for Procedure:  This is a 56 y.o. female with a history of severe pes planovalgus deformity and adult acquired flatfoot.  Preoperative MRI showed severe tendinosis and tearing of the posterior tibial tendon.  After failing nonoperative treatment surgical correction was recommended.  All risks, benefits and potential complications discussed  prior to the procedure. All questions addressed. Informed consent signed and reviewed.      Procedure in Detail: Patient was identified in pre-operative holding area. Formal consent was signed and the left lower extremity was marked. Patient was brought back to the operating room. Anesthesia was induced.  A local field block of 16 cc 1% lidocaine plain was performed.  The extremity was prepped and draped in the usual sterile fashion. Timeout was taken to confirm patient name, laterality, and procedure prior to incision.   Attention was then directed to the left lower extremity where a small incision was made on the medial border of the aponeurosis of the gastrocnemius muscle belly distal to the medial head.  An elevator was used to raise the soft tissues from the posterior portion of the aponeurosis.  The trocar and cannula was inserted and passed laterally and a counterincision was made.  The trocar was removed and the camera was inserted to visualize the gastrocnemius aponeurosis.  A hook blade was used to create a Strayer style gastrocnemius recession.  Once this was complete instrumentation was removed the wounds were irrigated and closed with 4-0 nylon.  I then directed my attention to the lateral calcaneus where landmarks were made for a medial calcaneal osteotomy on the skin.  Following this orientation, a small incision was made and blunt dissection was used to raise the soft tissue from the lateral calcaneal wall.  The Arthrex burr was inserted and passed medially into the medial calcaneal wall.  The burr was then retracted and the osteotomy was completed in quadrants with the burr until complete.  The posterior tuber was then translated medially and plantar.  Guidewires for 5.0 mm fully threaded  cannulated compression screws were then placed and checked under fluoroscopy for orientation position and correction.  Once this was satisfactory, the screws were drilled and inserted with good compression  and correction noted.  The wounds were irrigated and closed with 3-0 nylon.  I then directed my attention to the medial ankle and midfoot where a curvilinear incision was made from the posterior border of the tibia to the navicular tuberosity.  This was carried deep through subcutaneous tissue, cauterizing bleeding vessels as necessary.  The deep fascia was incised and the tendon sheath of the posterior tibial tendon was reflected.  A significant amount of synovial fluid and synovitis was encountered and this was excised.  The posterior tibial tendon had a longitudinal split tear along its entire length and was severely thickened.  The tendon was traced proximally and resected at the site of normal tendon.  This was sent for pathology.  Proximally the FDL tendon was identified and encountered tendon sheath was opened along its length.  It was harvested as far distal as possible.  This was then tenodesed to the stump of the posterior tibial tendon proximally using a Pulvertaft weave technique and secured with 2-0 Ethibond.  An incision was made over the sustentaculum tali and then the periosteum was elevated and an anchor for repair of the spring ligament was placed.  The ligament was intact but severely attenuated and incompetent.  I then placed a guidewire into the navicular from plantar to dorsal into the mid body, the tendon was sized appropriately and the drill over the guidewire was reamed.  The suture limbs for the spring ligament repair was then passed from dorsal to plantar and plantar to dorsal.  The flexor digitorum longus tendon was then secured with a whipstitch with FiberWire, the tendon was passed into the reamed hole from plantar to dorsal and the interference screw was placed with the foot held in plantarflexion and inversion under appropriate tension to secure the repair of both the spring ligament and transfer.  All wounds were then thoroughly irrigated.  Final films were taken.  The medial  incision was closed with 2-0 and 3-0 Vicryl, 4-0 Vicryl and 4-0 V-loc.  The foot was then dressed with Xeroform dry sterile dressings well-padded below the knee posterior and stirrup splint. Patient tolerated the procedure well.   Disposition: Following a period of post-operative monitoring, patient will be transferred to home.

## 2022-06-01 NOTE — Anesthesia Preprocedure Evaluation (Signed)
Anesthesia Evaluation  Patient identified by MRN, date of birth, ID band Patient awake    Reviewed: Allergy & Precautions, H&P , NPO status , Patient's Chart, lab work & pertinent test results, reviewed documented beta blocker date and time   Airway Mallampati: II  TM Distance: >3 FB Neck ROM: full    Dental  (+) Teeth Intact   Pulmonary neg pulmonary ROS, Patient abstained from smoking., former smoker   Pulmonary exam normal        Cardiovascular Exercise Tolerance: Good negative cardio ROS Normal cardiovascular exam Rhythm:regular Rate:Normal     Neuro/Psych  Headaches PSYCHIATRIC DISORDERS Anxiety Depression       GI/Hepatic Neg liver ROS, PUD,GERD  Medicated,,  Endo/Other  negative endocrine ROS    Renal/GU negative Renal ROS  negative genitourinary   Musculoskeletal   Abdominal   Peds  Hematology  (+) Blood dyscrasia, anemia   Anesthesia Other Findings Past Medical History: No date: Abnormal respirations No date: Abscess of abdominal cavity (HCC) No date: Anxiety No date: Aortic atherosclerosis (HCC) No date: Arthritis No date: Borderline anemia No date: Depression No date: Empyema of left pleural space (HCC) No date: GERD (gastroesophageal reflux disease) No date: Headache No date: Hypokalemia No date: Perforated ulcer ([place]) No date: Pleural effusion 08/17/2016: Protein-calorie malnutrition, severe No date: Respiratory distress 2018: Sepsis ([place]) Past Surgical History: 11/21/2017: ABDOMINAL WALL DEFECT REPAIR; N/A     Comment:  Procedure: REPAIR ABDOMINAL WALL, ABDOMINAL WALL               RECONSTRUCTION with mesh;  Surgeon: Jules Husbands, MD;                Location: [facility] ORS;  Service: General;  Laterality: N/A; 7/51/7001: APPLICATION OF WOUND VAC; N/A     Comment:  Procedure: ,remove 2  JP drains,add one pennrose;                Surgeon: Florene Glen, MD;  Location: [facility] ORS;                 Service: General;  Laterality: N/A; 08/08/2016: CHEST TUBE INSERTION; Right     Comment:  Procedure: CHEST TUBE INSERTION;  Surgeon: [name],              MD;  Location: [facility] ORS;  Service: General;  Laterality:               Right; 04/29/2018: COLONOSCOPY WITH PROPOFOL; N/A     Comment:  Procedure: COLONOSCOPY WITH PROPOFOL;  Surgeon:               Lollie Sails, MD;  Location: [facility] ENDOSCOPY;                Service: Endoscopy;  Laterality: N/A; 08/08/2016: DEBRIDEMENT OF ABDOMINAL WALL ABSCESS; N/A     Comment:  Procedure: DEBRIDEMENT OF ABDOMINAL WALL ABSCESS;                Surgeon: Florene Glen, MD;  Location: [facility] ORS;                Service: General;  Laterality: N/A; No date: DIAGNOSTIC LAPAROSCOPY 02/19/2017: ESOPHAGOGASTRODUODENOSCOPY (EGD) WITH PROPOFOL; N/A     Comment:  Procedure: ESOPHAGOGASTRODUODENOSCOPY (EGD) WITH               PROPOFOL;  Surgeon: Jonathon Bellows, MD;  Location: [facility]  ENDOSCOPY;  Service: Gastroenterology;  Laterality: N/A; 01/20/2018: ESOPHAGOGASTRODUODENOSCOPY (EGD) WITH PROPOFOL; N/A     Comment:  Procedure: ESOPHAGOGASTRODUODENOSCOPY (EGD) WITH               PROPOFOL;  Surgeon: Lollie Sails, MD;  Location:               [facility] ENDOSCOPY;  Service: Endoscopy;  Laterality: N/A; 07/20/2016: LAPAROTOMY; N/A     Comment:  Procedure: EXPLORATORY LAPAROTOMY;  Surgeon: Jules Husbands, MD;  Location: [facility] ORS;  Service: General;                Laterality: N/A; 07/30/2016: LAPAROTOMY; N/A     Comment:  Procedure: EXPLORATORY LAPAROTOMY drainage peritoneal               abscess;  Surgeon: Clayburn Pert, MD;  Location: [facility]               ORS;  Service: General;  Laterality: N/A; 07/30/2016: LYSIS OF ADHESION     Comment:  Procedure: LYSIS OF ADHESION;  Surgeon: Clayburn Pert,              MD;  Location: [facility] ORS;  Service: General;; No date: TONSILLECTOMY 08/08/2016: VIDEO ASSISTED THORACOSCOPY (VATS)/THOROCOTOMY; Left      Comment:  Procedure: VIDEO ASSISTED THORACOSCOPY               (VATS)/THOROCOTOMY Possible Thoracotomy;  Surgeon:               [name], MD;  Location: [facility] ORS;  Service: General;              Laterality: Left; BMI    Body Mass Index: 27.17 kg/m     Reproductive/Obstetrics negative OB ROS                             Anesthesia Physical Anesthesia Plan  ASA: 3  Anesthesia Plan: General ETT   Post-op Pain Management: Regional block*   Induction:   PONV Risk Score and Plan: 4 or greater  Airway Management Planned:   Additional Equipment:   Intra-op Plan:   Post-operative Plan:   Informed Consent: I have reviewed the patients History and Physical, chart, labs and discussed the procedure including the risks, benefits and alternatives for the proposed anesthesia with the patient or authorized representative who has indicated his/her understanding and acceptance.     Dental Advisory Given  Plan Discussed with: CRNA  Anesthesia Plan Comments: (I spoke with the patient regarding risks and benefits of post op pain block.  The procedure is per surgeon request and the patient voices understanding of the nature of the injection.  They understand the procedure is to assist with post op pain control and not to eliminate pain. Furthermore, they accept that the procedure has potential risks, including but not limited to: failed block, IV injection, paresthesia or nerve related injury etc. They desire to proceed with the above injection as discussed.  We discussed doing this in PACU for post op pain controlDr. Andree Elk)       Anesthesia Quick Evaluation

## 2022-06-01 NOTE — Discharge Instructions (Addendum)
Post-Surgery Instructions  1. If you are recuperating from surgery anywhere other than home, please be sure to leave [place] a number where you can be reached. 2. Go directly home and rest. 3. The keep operated foot (or feet) elevated six inches above the hip when sitting or lying down. 4. Support the elevated foot and leg with pillows under the calf. DO NOT PLACE PILLOWS UNDER THE KNEE. 5. DO NOT REMOVE or get your bandages wet. This will increase your chances of getting an infection. 6. Wear your surgical shoe at all times when you are up. 7. A limited amount of pain and swelling may occur. The skin may take on a bruised appearance. This is no cause for alarm. 8. Apply an ice pack under the knee 15 minutes every hour. Continue icing until seen in the office. DO NOT apply any form of heat to the area. 9. Have prescription(s) filled immediately and take as directed. 10. Drink lots of liquids, water, and juice. 11. CALL THE OFFICE IMMEDIATELY IF: a. Bleeding continues b. Pain increases and/or does not respond to medication c. Bandage or cast appears too tight d. Any liquids (water, coffee, etc.) have spilled on your bandages. e. Tripping, falling, or stubbing the surgical foot f. If your temperature rises above 101 g. If you have ANY questions at all 12. Please use the crutches, knee scooter, or walker you have prescribed, rented, or purchased. If you are non-weight bearing DO NOT put weight on the operated foot  If you are weight-bearing, follow your physician's instructions. You are expected to be:   non-weight bearing 13. Special Instructions: _____________________________________________________________ _________________________________________________________________________________ _________________________________________________________________________________  14. Your next appointment is: 06/06/2022 9:45 AM   If you need to reach the nurse for any reason, please  call: [place]: [phone] [place]: [phone] [place]: [phone]      AMBULATORY SURGERY  DISCHARGE INSTRUCTIONS   The drugs that you were given will stay in your system until tomorrow so for the next 24 hours you should not:  Drive an automobile Make any legal decisions Drink any alcoholic beverage   You may resume regular meals tomorrow.  Today it is better to start with liquids and gradually work up to solid foods.  You may eat anything you prefer, but it is better to start with liquids, then soup and crackers, and gradually work up to solid foods.   Please notify your doctor immediately if you have any unusual bleeding, trouble breathing, redness and pain at the surgery site, drainage, fever, or pain not relieved by medication.     Your post-operative visit with Dr.                                       is: Date:                        Time:    Please call to schedule your post-operative visit.  Additional Instructions:

## 2022-06-01 NOTE — Evaluation (Signed)
Physical Therapy Evaluation Patient Details Name: Lindsey Wolf MRN: [id number] DOB: 20-Sep-1965 Today's Date: 06/01/2022  History of Present Illness  56 yo female with L foot pes planus deformity with collapse of medial arch, elevation of first ray, and valgus heel presenting for surgical intervention. Pt is now s/p left flat foot reconstruction and reconstruction of the ankle.  Clinical Impression  The pt presents this session with NWB LLE status. She is given verbal explanation of hop to gait pattern with crutches and squat pivot transfer. She is also given a visual demonstration of each technique and is able to demonstrate safety with transfer and gait with and without crutches. The pt is safe to d/c home with PRN assistance. Once her WB status changes she may benefit from outpatient physical therapy, to be determine by follow up physician.      Recommendations for follow up therapy are one component of a multi-disciplinary discharge planning process, led by the attending physician.  Recommendations may be updated based on patient status, additional functional criteria and insurance authorization.  Follow Up Recommendations No PT follow up      Assistance Recommended at Discharge PRN  Patient can return home with the following  A little help with walking and/or transfers;A little help with bathing/dressing/bathroom    Equipment Recommendations Crutches  Recommendations for Other Services       Functional Status Assessment Patient has had a recent decline in their functional status and demonstrates the ability to make significant improvements in function in a reasonable and predictable amount of time.     Precautions / Restrictions Precautions Precautions: Fall Restrictions Weight Bearing Restrictions: Yes LLE Weight Bearing: Non weight bearing      Mobility  Bed Mobility               General bed mobility comments: sitting in upright chair.     Transfers Overall transfer level: Needs assistance Equipment used: Crutches Transfers: Sit to/from Stand, Bed to chair/wheelchair/BSC Sit to Stand: Supervision, Min guard           General transfer comment: Pt educated on proper technique for sit<>stand with use of crutches and squat pivot transfer maintaining WB precaution without use of an AD. Verbal explanation with demonstration provided.    Ambulation/Gait Ambulation/Gait assistance: Supervision Gait Distance (Feet): 15 Feet Assistive device: Crutches         General Gait Details: Hop to gait pattern.  Stairs            Wheelchair Mobility    Modified Rankin (Stroke Patients Only)       Balance Overall balance assessment: Needs assistance   Sitting balance-Leahy Scale: Normal       Standing balance-Leahy Scale: Good                               Pertinent Vitals/Pain Pain Assessment Pain Assessment: No/denies pain    Home Living Family/patient expects to be discharged to:: Private residence Living Arrangements: Alone Available Help at Discharge: Available PRN/intermittently;Family   Home Access: Ramped entrance       Home Layout: One level   Additional Comments: knee scooter    Prior Function Prior Level of Function : Independent/Modified Independent                     Hand Dominance   Dominant Hand: Right    Extremity/Trunk Assessment   Upper Extremity Assessment Upper Extremity  Assessment: Overall WFL for tasks assessed    Lower Extremity Assessment Lower Extremity Assessment: Overall WFL for tasks assessed       Communication   Communication: No difficulties  Cognition Arousal/Alertness: Awake/alert Behavior During Therapy: WFL for tasks assessed/performed Overall Cognitive Status: Within Functional Limits for tasks assessed                                          General Comments      Exercises     Assessment/Plan     PT Assessment All further PT needs can be met in the next venue of care  PT Problem List Decreased mobility;Decreased balance       PT Treatment Interventions      PT Goals (Current goals can be found in the Care Plan section)  Acute Rehab PT Goals Patient Stated Goal: return home PT Goal Formulation: With patient Time For Goal Achievement: 06/04/22 Potential to Achieve Goals: Good    Frequency       Co-evaluation               AM-PAC PT "6 Clicks" Mobility  Outcome Measure Help needed turning from your back to your side while in a flat bed without using bedrails?: None Help needed moving from lying on your back to sitting on the side of a flat bed without using bedrails?: None Help needed moving to and from a bed to a chair (including a wheelchair)?: None Help needed standing up from a chair using your arms (e.g., wheelchair or bedside chair)?: None Help needed to walk in hospital room?: A Little Help needed climbing 3-5 steps with a railing? : A Little 6 Click Score: 22    End of Session Equipment Utilized During Treatment: Gait belt Activity Tolerance: Patient tolerated treatment well Patient left: in chair;with nursing/sitter in room Nurse Communication: Mobility status PT Visit Diagnosis: Unsteadiness on feet (R26.81);Difficulty in walking, not elsewhere classified (R26.2)    Time: 2575-0518 PT Time Calculation (min) (ACUTE ONLY): 17 min   Charges:   PT Evaluation $PT Eval Low Complexity: 1 Low PT Treatments $Gait Training: 8-22 mins        3:10 PM, 06/01/22 Falcon Mccaskey A. Saverio Danker PT, DPT Physical Therapist - [facility]   Prudence Heiny A Michiah Mudry 06/01/2022, 3:08 PM

## 2022-06-01 NOTE — H&P (Signed)
History and Physical Interval Note:  06/01/2022 7:18 AM  Lindsey Wolf  has presented today for surgery, with the diagnosis of pes planus, equinus, PT tendon tear.  The various methods of treatment have been discussed with the patient and family. After consideration of risks, benefits and other options for treatment, the patient has consented to   Procedure(s) with comments: FLAT FOOT RECONSTRUCTION-TAL GASTROC RECESSION (Left) - BLOCK TIBIALIS TENDON TRANSFER / REPAIR (Left) RECONSTRUCTION ANKLE (Left) as a surgical intervention.  The patient's history has been reviewed, patient examined, no change in status, stable for surgery.  I have reviewed the patient's chart and labs.  Questions were answered to the patient's satisfaction.     Lindsey Wolf

## 2022-06-01 NOTE — Anesthesia Procedure Notes (Signed)
Procedure Name: Intubation Date/Time: 06/01/2022 7:51 AM  Performed by: Candice Camp, CRNAPre-anesthesia Checklist: Timeout performed, Patient being monitored, Suction available, Emergency Drugs available and Patient identified Patient Re-evaluated:Patient Re-evaluated prior to induction Oxygen Delivery Method: Circle system utilized Preoxygenation: Pre-oxygenation with 100% oxygen Induction Type: IV induction Ventilation: Mask ventilation without difficulty Laryngoscope Size: McGraph and 4 Grade View: Grade I Tube type: Oral Tube size: 7.0 mm Number of attempts: 1 Airway Equipment and Method: Stylet and Video-laryngoscopy Placement Confirmation: ETT inserted through vocal cords under direct vision, positive ETCO2 and breath sounds checked- equal and bilateral Secured at: 22 cm Tube secured with: Tape Dental Injury: Teeth and Oropharynx as per pre-operative assessment

## 2022-06-01 NOTE — Anesthesia Procedure Notes (Signed)
Anesthesia Regional Block: Popliteal block   Pre-Anesthetic Checklist: , timeout performed,  Correct Patient, Correct Site, Correct Laterality,  Correct Procedure, Correct Position, site marked,  Risks and benefits discussed,  Surgical consent,  Pre-op evaluation,  At surgeon's request and post-op pain management  Laterality: Upper and Left  Prep: chloraprep       Needles:  Injection technique: Single-shot  Needle Type: Echogenic Stimulator Needle     Needle Length: 10cm  Needle Gauge: 20     Additional Needles:   Procedures:,,,, ultrasound used (permanent image in chart),,    Narrative:  End time: 06/01/2022 11:55 AM  Performed by: Personally  Anesthesiologist: Molli Barrows, MD  Additional Notes: Pt. Identified and accepting of procedure after risks and benefits fully reviewed and questions answered. Time out performed and laterality confirmed prior to procedure.  ISNB  performed without difficulty and well tolerated.  Neg IV and SATD.  No pain on injection of Local anesthetic and VSST.

## 2022-06-01 NOTE — Transfer of Care (Signed)
Immediate Anesthesia Transfer of Care Note  Patient: Lindsey Wolf  Procedure(s) Performed: FLAT FOOT RECONSTRUCTION-TAL GASTROC RECESSION (Left) TIBIALIS TENDON TRANSFER/repair spring ligament (Left) CALCANEAL OSTEOTOMY  Patient Location: PACU  Anesthesia Type:General  Level of Consciousness: awake, alert , oriented, and patient cooperative  Airway & Oxygen Therapy: Patient Spontanous Breathing and Patient connected to face mask oxygen  Post-op Assessment: Report given to RN, Post -op Vital signs reviewed and stable, Patient moving all extremities, and Patient moving all extremities X 4  Post vital signs: Reviewed and stable  Last Vitals:  Vitals Value Taken Time  BP 136/82 06/01/22 1045  Temp    Pulse 80 06/01/22 1044  Resp 24 06/01/22 1048  SpO2 100 % 06/01/22 1044  Vitals shown include unvalidated device data.  Last Pain:  Vitals:   06/01/22 0640  TempSrc: Temporal  PainSc: 0-No pain      Patients Stated Pain Goal: 0 (14/70/92 9574)  Complications: No notable events documented.

## 2022-06-02 DIAGNOSIS — M2142 Flat foot [pes planus] (acquired), left foot: Secondary | ICD-10-CM

## 2022-06-02 DIAGNOSIS — M66872 Spontaneous rupture of other tendons, left ankle and foot: Secondary | ICD-10-CM

## 2022-06-02 DIAGNOSIS — M62462 Contracture of muscle, left lower leg: Secondary | ICD-10-CM

## 2022-06-02 DIAGNOSIS — S93692A Other sprain of left foot, initial encounter: Secondary | ICD-10-CM

## 2022-06-03 ENCOUNTER — Encounter: Payer: Self-pay | Admitting: Podiatry

## 2022-06-04 DIAGNOSIS — M21962 Unspecified acquired deformity of left lower leg: Secondary | ICD-10-CM | POA: Diagnosis not present

## 2022-06-04 DIAGNOSIS — E785 Hyperlipidemia, unspecified: Secondary | ICD-10-CM | POA: Diagnosis not present

## 2022-06-04 DIAGNOSIS — S86112A Strain of other muscle(s) and tendon(s) of posterior muscle group at lower leg level, left leg, initial encounter: Secondary | ICD-10-CM | POA: Diagnosis not present

## 2022-06-04 DIAGNOSIS — M62462 Contracture of muscle, left lower leg: Secondary | ICD-10-CM | POA: Diagnosis not present

## 2022-06-04 DIAGNOSIS — R2681 Unsteadiness on feet: Secondary | ICD-10-CM | POA: Diagnosis not present

## 2022-06-04 DIAGNOSIS — M2142 Flat foot [pes planus] (acquired), left foot: Secondary | ICD-10-CM | POA: Diagnosis not present

## 2022-06-04 DIAGNOSIS — F17210 Nicotine dependence, cigarettes, uncomplicated: Secondary | ICD-10-CM | POA: Diagnosis not present

## 2022-06-04 DIAGNOSIS — J439 Emphysema, unspecified: Secondary | ICD-10-CM | POA: Diagnosis not present

## 2022-06-04 DIAGNOSIS — I700 Atherosclerosis of aorta: Secondary | ICD-10-CM | POA: Diagnosis not present

## 2022-06-05 DIAGNOSIS — M62462 Contracture of muscle, left lower leg: Secondary | ICD-10-CM | POA: Diagnosis not present

## 2022-06-05 DIAGNOSIS — I700 Atherosclerosis of aorta: Secondary | ICD-10-CM | POA: Diagnosis not present

## 2022-06-05 DIAGNOSIS — E785 Hyperlipidemia, unspecified: Secondary | ICD-10-CM | POA: Diagnosis not present

## 2022-06-05 DIAGNOSIS — M21962 Unspecified acquired deformity of left lower leg: Secondary | ICD-10-CM | POA: Diagnosis not present

## 2022-06-05 DIAGNOSIS — M2142 Flat foot [pes planus] (acquired), left foot: Secondary | ICD-10-CM | POA: Diagnosis not present

## 2022-06-05 DIAGNOSIS — F17210 Nicotine dependence, cigarettes, uncomplicated: Secondary | ICD-10-CM | POA: Diagnosis not present

## 2022-06-05 DIAGNOSIS — J439 Emphysema, unspecified: Secondary | ICD-10-CM | POA: Diagnosis not present

## 2022-06-05 DIAGNOSIS — S86112A Strain of other muscle(s) and tendon(s) of posterior muscle group at lower leg level, left leg, initial encounter: Secondary | ICD-10-CM | POA: Diagnosis not present

## 2022-06-05 DIAGNOSIS — R2681 Unsteadiness on feet: Secondary | ICD-10-CM | POA: Diagnosis not present

## 2022-06-05 LAB — SURGICAL PATHOLOGY

## 2022-06-05 NOTE — Anesthesia Postprocedure Evaluation (Signed)
Anesthesia Post Note  Patient: Lindsey Wolf  Procedure(s) Performed: FLAT FOOT RECONSTRUCTION-TAL GASTROC RECESSION (Left) TIBIALIS TENDON TRANSFER/repair spring ligament (Left) CALCANEAL OSTEOTOMY  Patient location during evaluation: PACU Anesthesia Type: General Level of consciousness: awake and alert Pain management: pain level controlled Vital Signs Assessment: post-procedure vital signs reviewed and stable Respiratory status: spontaneous breathing, nonlabored ventilation, respiratory function stable and patient connected to nasal cannula oxygen Cardiovascular status: blood pressure returned to baseline and stable Postop Assessment: no apparent nausea or vomiting Anesthetic complications: no   No notable events documented.   Last Vitals:  Vitals:   06/01/22 1237 06/01/22 1409  BP: 109/73 131/77  Pulse: 75 74  Resp: 20 16  Temp:  36.7 C  SpO2: 98% 98%    Last Pain:  Vitals:   06/01/22 1409  TempSrc: Temporal  PainSc: 0-No pain                 Molli Barrows

## 2022-06-06 ENCOUNTER — Ambulatory Visit (INDEPENDENT_AMBULATORY_CARE_PROVIDER_SITE_OTHER): Payer: Medicare HMO | Admitting: Podiatry

## 2022-06-06 ENCOUNTER — Ambulatory Visit (INDEPENDENT_AMBULATORY_CARE_PROVIDER_SITE_OTHER): Payer: Medicare HMO

## 2022-06-06 DIAGNOSIS — S93692A Other sprain of left foot, initial encounter: Secondary | ICD-10-CM | POA: Diagnosis not present

## 2022-06-06 DIAGNOSIS — Q666 Other congenital valgus deformities of feet: Secondary | ICD-10-CM

## 2022-06-06 DIAGNOSIS — M66872 Spontaneous rupture of other tendons, left ankle and foot: Secondary | ICD-10-CM

## 2022-06-06 DIAGNOSIS — M62462 Contracture of muscle, left lower leg: Secondary | ICD-10-CM | POA: Diagnosis not present

## 2022-06-06 NOTE — Progress Notes (Signed)
  Subjective:  Patient ID: Lindsey Wolf, female    DOB: 1965-12-28,  MRN: [id number]  Chief Complaint  Patient presents with   Routine Post Op    POV #1 DOS 06/01/2022 LT FOOT RECONSTRUCTION OF ARCH W/TENDON/LIGAMENT REPAIR, TENDON TRANSFER, BONE CUT IN HEL & ARCH W/BONE GRAFT, CALF MUSCLE LENGTHENING     57 y.o. female returns for post-op check.  She says she did slip and fall and put some weight on the splint trying to bathe.  Pain did not really increase after this.  She is taking Xarelto.  Review of Systems: Negative except as noted in the HPI. Denies N/V/F/Ch.   Objective:  There were no vitals filed for this visit. There is no height or weight on file to calculate BMI. Constitutional Well developed. Well nourished.  Vascular Foot warm and well perfused. Capillary refill normal to all digits.  Calf is soft and supple, no posterior calf or knee pain, negative Homans' sign  Neurologic Normal speech. Oriented to person, place, and time. Epicritic sensation to light touch grossly present bilaterally.  Dermatologic Skin healing well without signs of infection. Skin edges well coapted without signs of infection.  Orthopedic: Tenderness to palpation noted about the surgical site.  Moderate edema   Multiple view plain film radiographs: Correction maintained, no complication of hardware Assessment:   1. Gastrocnemius equinus of left lower extremity   2. Spring ligament tear, left, initial encounter   3. Tibialis posterior tendon tear, nontraumatic, left   4. Pes planovalgus    Plan:  Patient was evaluated and treated and all questions answered.  S/p foot surgery left -Progressing as expected post-operatively. -XR: Noted above no complications -WB Status: NWB in posterior and stirrup splint reapplied well-padded with cast padding -Sutures: Return on 06/25/2022 for suture removal. -Medications: No refills required continue Xarelto -Foot redressed.  No follow-ups on file.

## 2022-06-20 ENCOUNTER — Encounter: Payer: Medicare HMO | Admitting: Podiatry

## 2022-06-25 ENCOUNTER — Telehealth: Payer: Self-pay

## 2022-06-25 ENCOUNTER — Ambulatory Visit (INDEPENDENT_AMBULATORY_CARE_PROVIDER_SITE_OTHER): Payer: Medicare HMO | Admitting: Podiatry

## 2022-06-25 DIAGNOSIS — S93692A Other sprain of left foot, initial encounter: Secondary | ICD-10-CM

## 2022-06-25 DIAGNOSIS — M66872 Spontaneous rupture of other tendons, left ankle and foot: Secondary | ICD-10-CM | POA: Diagnosis not present

## 2022-06-25 DIAGNOSIS — M62462 Contracture of muscle, left lower leg: Secondary | ICD-10-CM

## 2022-06-25 MED ORDER — OXYCODONE HCL 5 MG PO TABS: 5.0000 mg | ORAL_TABLET | ORAL | 0 refills | Status: AC | PRN

## 2022-06-25 NOTE — Addendum Note (Signed)
Addended bySherryle Lis, Kynedi Profitt R on: 06/25/2022 04:54 PM   Modules accepted: Orders

## 2022-06-25 NOTE — Telephone Encounter (Signed)
Pt called again about severe heel pain. Please advise

## 2022-06-26 NOTE — Telephone Encounter (Signed)
Patient is aware and verbalized understanding of information given. 

## 2022-06-26 NOTE — Progress Notes (Signed)
  Subjective:  Patient ID: Lindsey Wolf, female    DOB: Jan 26, 1966,  MRN: [id number]  Chief Complaint  Patient presents with   Routine Post Op    POV #2 DOS 06/01/2022 LT FOOT RECONSTRUCTION OF ARCH W/TENDON/LIGAMENT REPAIR, TENDON TRANSFER, BONE CUT IN HEL & ARCH W/BONE GRAFT, CALF MUSCLE LENGTHENING     57 y.o. female returns for post-op check.  Has put some weight on it.  Overall doing okay.  Pain is improving.  Review of Systems: Negative except as noted in the HPI. Denies N/V/F/Ch.   Objective:  There were no vitals filed for this visit. There is no height or weight on file to calculate BMI. Constitutional Well developed. Well nourished.  Vascular Foot warm and well perfused. Capillary refill normal to all digits.  Calf is soft and supple, no posterior calf or knee pain, negative Homans' sign  Neurologic Normal speech. Oriented to person, place, and time. Epicritic sensation to light touch grossly present bilaterally.  Dermatologic Incision well-healed not atrophic  Orthopedic: She has mild tenderness to palpation noted about the surgical site.  Mild edema   Multiple view plain film radiographs: Correction maintained, no complication of hardware Assessment:   1. Tibialis posterior tendon tear, nontraumatic, left   2. Spring ligament tear, left, initial encounter   3. Gastrocnemius equinus of left lower extremity    Plan:  Patient was evaluated and treated and all questions answered.  S/p foot surgery left -Overall doing fairly well and all sutures were removed today.  We are transition out of the splint and into a removable cam walker boot so she may begin bathing.  Still remain nonweightbearing until next visit.  After that we will begin physical therapy and gradual weightbearing in boot pending x-rays  No follow-ups on file.

## 2022-07-09 ENCOUNTER — Encounter: Payer: Medicare HMO | Admitting: Podiatry

## 2022-07-11 ENCOUNTER — Encounter: Payer: Medicare HMO | Admitting: Podiatry

## 2022-07-18 ENCOUNTER — Encounter: Payer: Self-pay | Admitting: Podiatry

## 2022-07-18 ENCOUNTER — Ambulatory Visit (INDEPENDENT_AMBULATORY_CARE_PROVIDER_SITE_OTHER): Payer: Medicare HMO | Admitting: Podiatry

## 2022-07-18 ENCOUNTER — Ambulatory Visit (INDEPENDENT_AMBULATORY_CARE_PROVIDER_SITE_OTHER): Payer: Medicare HMO

## 2022-07-18 DIAGNOSIS — S93692A Other sprain of left foot, initial encounter: Secondary | ICD-10-CM

## 2022-07-18 DIAGNOSIS — Q666 Other congenital valgus deformities of feet: Secondary | ICD-10-CM

## 2022-07-18 DIAGNOSIS — M62462 Contracture of muscle, left lower leg: Secondary | ICD-10-CM

## 2022-07-18 DIAGNOSIS — M66872 Spontaneous rupture of other tendons, left ankle and foot: Secondary | ICD-10-CM

## 2022-07-18 DIAGNOSIS — Z9889 Other specified postprocedural states: Secondary | ICD-10-CM | POA: Diagnosis not present

## 2022-07-18 NOTE — Progress Notes (Signed)
  Subjective:  Patient ID: Lindsey Wolf, female    DOB: Dec 29, 1965,  MRN: [id number]  Chief Complaint  Patient presents with   Routine Post Op    POV #2 DOS 06/01/2022 LT FOOT RECONSTRUCTION OF ARCH W/TENDON/LIGAMENT REPAIR, TENDON TRANSFER, BONE CUT IN HEL & ARCH W/BONE GRAFT, CALF MUSCLE LENGTHENING     57 y.o. female returns for post-op check.  Doing well not having much pain  Review of Systems: Negative except as noted in the HPI. Denies N/V/F/Ch.   Objective:  There were no vitals filed for this visit. There is no height or weight on file to calculate BMI. Constitutional Well developed. Well nourished.  Vascular Foot warm and well perfused. Capillary refill normal to all digits.  Calf is soft and supple, no posterior calf or knee pain, negative Homans' sign  Neurologic Normal speech. Oriented to person, place, and time. Epicritic sensation to light touch grossly present bilaterally.  Dermatologic Incision well-healed not hyper trophic  Orthopedic: Minimal tenderness today.  Edema improved.  Strength is good   Multiple view plain film radiographs: Correction maintained, no complication of hardware Assessment:   1. Tibialis posterior tendon tear, nontraumatic, left   2. Spring ligament tear, left, initial encounter   3. Gastrocnemius equinus of left lower extremity    Plan:  Patient was evaluated and treated and all questions answered.  S/p foot surgery left -She is doing well she can be gradual weightbearing in the boot continue ice and elevate as needed.  She should begin physical therapy as well and I gave her home exercises to start at home.  She will check with [facility] or [facility] in [place] and let me know about availability and which ones take her insurance.  Will send a referral when she lets [place] know this.  I will see her back in 6 weeks for new x-rays and transition to regular shoe gear and ankle brace  No follow-ups on file.

## 2022-07-18 NOTE — Patient Instructions (Addendum)
Posterior Tibial Tendon Tear Rehab Ask your health care provider which exercises are safe for you. Do exercises exactly as told by your health care provider and adjust them as directed. It is normal to feel mild stretching, pulling, tightness, or discomfort as you do these exercises. Stop right away if you feel sudden pain or your pain gets worse. Do not begin these exercises until told by your health care provider. Stretching and range-of-motion exercises These exercises warm up your muscles and joints and improve the movement and flexibility of your ankle. These exercises also help to relieve pain, numbness, and tingling. Gastroc stretch  Sit on the floor with your left / right leg extended. Loop a belt or towel around ball of your left / right foot. The ball of your foot is on the walking surface, right under your toes. Keep your left / right ankle and foot relaxed and keep your knee straight while you use the belt or towel to pull your foot and ankle toward you. You should feel a gentle stretch behind your calf or knee (gastrocnemius). Hold this position for 10 seconds. Repeat 10 times. Complete this exercise 2 times a day. Active ankle dorsiflexion and plantar flexion  Sit with your left / right knee straight or bent. Do not rest your foot on anything. Flex your left / right ankle to tilt the top of your foot toward your shin (dorsiflexion). Hold this position for 5 seconds. Point your toes downward to tilt the top of your foot away from your shin (plantar flexion). Hold this position for 5 seconds. Repeat 10 times with your knee straight and 10 times with your knee bent. Complete this exercise 2 times a day. Passive ankle plantar flexion  Sit with your left / right leg crossed over your opposite knee. With your left / right hand, pull the front of your foot and toes toward you (plantar flexion). You should feel a gentle stretch on the top of your foot and ankle. Hold this position for 10  seconds. Repeat 10 times. Complete this exercise 2 times a day. Passive ankle eversion  Sit with your left / right ankle crossed over your opposite knee. With your left / right hand, hold your foot so that your thumb is on the top of your foot and your fingers are on the bottom of your foot. Gently push and twist your ankle downward (eversion) so the smallest toes rise slightly toward the ceiling. You should feel a gentle stretch on the inside of your ankle. Hold this stretch for 10 seconds. Repeat 10 times. Complete this exercise 2 times a day. Passive ankle inversion  Sit with your left / right ankle crossed over your opposite knee. With your left / right hand, hold your foot so that your thumb is on the bottom of your foot and your fingers are across the top of your foot. Gently pull and twist your foot so the smallest toe comes toward you (inversion). You should feel a gentle stretch on the outside of your ankle. Hold the stretch for 10 seconds. Repeat 10 times. Complete this exercise 2 times a day. Ankle alphabet  Sit with your left / right leg supported at the lower leg. Do not rest your foot on anything. Make sure your foot has room to move freely. Think of your left / right foot as a paintbrush, and move your foot to trace each letter of the alphabet in the air. Keep your hip and knee still while you  trace. Trace every letter of the alphabet. Repeat 2 times. Complete this exercise 2 times a day. Strengthening exercises These exercises build strength and endurance in your lower leg. Endurance is the ability to use your muscles for a long time, even after they get tired. Dorsiflexion  Secure a rubber exercise band or tube to an object that will not move if it is pulled on, such as a table leg. Secure the other end of the band around your left / right foot. Sit on the floor, facing the object with your left / right leg extended. The band or tube should be slightly tense when your  foot is relaxed. Slowly flex your left / right ankle and toes to bring your foot toward you (dorsiflexion). Hold this position for 10 seconds. Let the band or tube slowly pull your foot back to the starting position. Repeat 10 times. Complete this exercise 2 times a day. Plantar flexion while seated  Sit on the floor with your left / right leg extended. Loop a rubber exercise band or tube around the ball of your left / right foot. The ball of your foot is on the walking surface, right under your toes. The band or tube should be slightly tense when your foot is relaxed. Slowly point your toes downward, pushing them away from you (plantar flexion). Hold this position for 10 seconds. Let the band or tube slowly pull your foot back to the starting position. Repeat 10 times. Complete this exercise 2 times a day. Towel curls  Sit in a chair on a non-carpeted surface, and put your feet on the floor. Place a towel in front of your feet.  Keeping your heel on the floor, put your left / right foot on the towel. Pull the towel toward you by grabbing the towel with your toes and curling them under. Keep your heel on the floor. Repeat 10 times. Complete this exercise 2 times a day. This information is not intended to replace advice given to you by your health care provider. Make sure you discuss any questions you have with your health care provider. Document Revised: 09/12/2018 Document Reviewed: 07/09/2018 Elsevier Patient Education  [place].

## 2022-07-24 ENCOUNTER — Other Ambulatory Visit: Payer: Self-pay

## 2022-07-24 DIAGNOSIS — S93692A Other sprain of left foot, initial encounter: Secondary | ICD-10-CM

## 2022-07-24 DIAGNOSIS — M66872 Spontaneous rupture of other tendons, left ankle and foot: Secondary | ICD-10-CM

## 2022-07-24 DIAGNOSIS — M25572 Pain in left ankle and joints of left foot: Secondary | ICD-10-CM | POA: Diagnosis not present

## 2022-07-26 ENCOUNTER — Telehealth: Payer: Self-pay

## 2022-07-26 MED ORDER — TRAMADOL HCL 50 MG PO TABS: 50.0000 mg | ORAL_TABLET | Freq: Four times a day (QID) | ORAL | 0 refills | Status: AC | PRN

## 2022-07-26 NOTE — Telephone Encounter (Signed)
Patient is aware 

## 2022-07-26 NOTE — Telephone Encounter (Signed)
Tramadol sent. 

## 2022-07-31 ENCOUNTER — Telehealth: Payer: Self-pay | Admitting: *Deleted

## 2022-07-31 NOTE — Telephone Encounter (Signed)
Patient is asking for an alternative for previous medication sent in (tramadol?),still in pain, left side and heel. Called patient for more clarification on medication sent,left voice message for call back. Spoke with pharmacy and it is not covered under patient's insurance, please advise.

## 2022-08-01 ENCOUNTER — Telehealth: Payer: Self-pay | Admitting: *Deleted

## 2022-08-01 DIAGNOSIS — M25572 Pain in left ankle and joints of left foot: Secondary | ICD-10-CM | POA: Diagnosis not present

## 2022-08-01 MED ORDER — OXYCODONE HCL 5 MG PO TABS: 5.0000 mg | ORAL_TABLET | ORAL | 0 refills | Status: AC | PRN

## 2022-08-01 NOTE — Telephone Encounter (Signed)
Spoke with patient to update that oxycodone-5 mg has been sent in,verbalized understanding that PT is needing clarification/protocol on referral sent, please call: [phone],[name]. Called [facility], no answer, left voice message to call us for clarification.

## 2022-08-10 DIAGNOSIS — M25572 Pain in left ankle and joints of left foot: Secondary | ICD-10-CM | POA: Diagnosis not present

## 2022-08-15 DIAGNOSIS — M25572 Pain in left ankle and joints of left foot: Secondary | ICD-10-CM | POA: Diagnosis not present

## 2022-08-28 DIAGNOSIS — F3341 Major depressive disorder, recurrent, in partial remission: Secondary | ICD-10-CM | POA: Diagnosis not present

## 2022-08-28 DIAGNOSIS — E876 Hypokalemia: Secondary | ICD-10-CM | POA: Diagnosis not present

## 2022-08-28 DIAGNOSIS — R7989 Other specified abnormal findings of blood chemistry: Secondary | ICD-10-CM | POA: Diagnosis not present

## 2022-08-28 DIAGNOSIS — R7309 Other abnormal glucose: Secondary | ICD-10-CM | POA: Diagnosis not present

## 2022-08-28 DIAGNOSIS — K279 Peptic ulcer, site unspecified, unspecified as acute or chronic, without hemorrhage or perforation: Secondary | ICD-10-CM | POA: Diagnosis not present

## 2022-08-28 DIAGNOSIS — M25572 Pain in left ankle and joints of left foot: Secondary | ICD-10-CM | POA: Diagnosis not present

## 2022-08-28 DIAGNOSIS — R519 Headache, unspecified: Secondary | ICD-10-CM | POA: Diagnosis not present

## 2022-08-29 ENCOUNTER — Ambulatory Visit (INDEPENDENT_AMBULATORY_CARE_PROVIDER_SITE_OTHER): Payer: Medicare HMO | Admitting: Podiatry

## 2022-08-29 ENCOUNTER — Ambulatory Visit (INDEPENDENT_AMBULATORY_CARE_PROVIDER_SITE_OTHER): Payer: Medicare HMO

## 2022-08-29 DIAGNOSIS — M66872 Spontaneous rupture of other tendons, left ankle and foot: Secondary | ICD-10-CM

## 2022-08-29 DIAGNOSIS — S93692A Other sprain of left foot, initial encounter: Secondary | ICD-10-CM

## 2022-08-29 DIAGNOSIS — Q666 Other congenital valgus deformities of feet: Secondary | ICD-10-CM

## 2022-08-29 NOTE — Progress Notes (Signed)
  Subjective:  Patient ID: Lindsey Wolf, female    DOB: 30-Nov-1965,  MRN: [id number]  Chief Complaint  Patient presents with   Routine Post Op    Post op xrays needed     57 y.o. female returns for post-op check.  She is doing well physical therapy is helping they have not done much other than nonweightbearing exercises so far.  She is having some tenderness on the lateral heel  Review of Systems: Negative except as noted in the HPI. Denies N/V/F/Ch.   Objective:  There were no vitals filed for this visit. There is no height or weight on file to calculate BMI. Constitutional Well developed. Well nourished.  Vascular Foot warm and well perfused. Capillary refill normal to all digits.  Calf is soft and supple, no posterior calf or knee pain, negative Homans' sign  Neurologic Normal speech. Oriented to person, place, and time. Epicritic sensation to light touch grossly present bilaterally.  Dermatologic Incision well-healed not hyper trophic.  Slight scar tissue around the lateral heel site  Orthopedic: Minimal tenderness today.  Edema improved.  Strength is good   Multiple view plain film radiographs: Radiographs taken today show complete healing across osteotomy site Assessment:   1. Tibialis posterior tendon tear, nontraumatic, left   2. Spring ligament tear, left, initial encounter   3. Pes planovalgus    Plan:  Patient was evaluated and treated and all questions answered.  S/p foot surgery left -Overall doing well.  She is having some tenderness in the lateral heel which seems to be centered around some scar tissue deep to the incision.  Do not think hardware is a cause of this.  I recommended her physical therapy to work on this with deep tissue manipulation and scar massage, she may begin full weightbearing activity in regular shoe gear at this point and they can focus on strengthening and conditioning which I discussed with her may take another 2 to 3 months completely  for comfort.  I will see her back in 6 weeks for follow-up.  Return in about 6 weeks (around 10/10/2022) for surgery follow up .

## 2022-09-10 DIAGNOSIS — F17210 Nicotine dependence, cigarettes, uncomplicated: Secondary | ICD-10-CM | POA: Diagnosis not present

## 2022-09-10 DIAGNOSIS — Z0000 Encounter for general adult medical examination without abnormal findings: Secondary | ICD-10-CM | POA: Diagnosis not present

## 2022-09-10 DIAGNOSIS — K259 Gastric ulcer, unspecified as acute or chronic, without hemorrhage or perforation: Secondary | ICD-10-CM | POA: Diagnosis not present

## 2022-09-10 DIAGNOSIS — R5381 Other malaise: Secondary | ICD-10-CM | POA: Diagnosis not present

## 2022-09-10 DIAGNOSIS — J439 Emphysema, unspecified: Secondary | ICD-10-CM | POA: Diagnosis not present

## 2022-09-10 DIAGNOSIS — E663 Overweight: Secondary | ICD-10-CM | POA: Diagnosis not present

## 2022-09-10 DIAGNOSIS — Z03818 Encounter for observation for suspected exposure to other biological agents ruled out: Secondary | ICD-10-CM | POA: Diagnosis not present

## 2022-09-10 DIAGNOSIS — Z6828 Body mass index (BMI) 28.0-28.9, adult: Secondary | ICD-10-CM | POA: Diagnosis not present

## 2022-09-10 DIAGNOSIS — J209 Acute bronchitis, unspecified: Secondary | ICD-10-CM | POA: Diagnosis not present

## 2022-09-10 DIAGNOSIS — Z1331 Encounter for screening for depression: Secondary | ICD-10-CM | POA: Diagnosis not present

## 2022-09-13 DIAGNOSIS — M25572 Pain in left ankle and joints of left foot: Secondary | ICD-10-CM | POA: Diagnosis not present

## 2022-09-25 DIAGNOSIS — M25572 Pain in left ankle and joints of left foot: Secondary | ICD-10-CM | POA: Diagnosis not present

## 2022-10-02 DIAGNOSIS — M25572 Pain in left ankle and joints of left foot: Secondary | ICD-10-CM | POA: Diagnosis not present

## 2022-10-05 DIAGNOSIS — J019 Acute sinusitis, unspecified: Secondary | ICD-10-CM | POA: Diagnosis not present

## 2022-10-05 DIAGNOSIS — J018 Other acute sinusitis: Secondary | ICD-10-CM | POA: Diagnosis not present

## 2022-10-05 DIAGNOSIS — F17200 Nicotine dependence, unspecified, uncomplicated: Secondary | ICD-10-CM | POA: Diagnosis not present

## 2022-10-05 DIAGNOSIS — R490 Dysphonia: Secondary | ICD-10-CM | POA: Diagnosis not present

## 2022-10-05 DIAGNOSIS — B379 Candidiasis, unspecified: Secondary | ICD-10-CM | POA: Diagnosis not present

## 2022-10-10 ENCOUNTER — Ambulatory Visit: Payer: Medicare HMO | Admitting: Podiatry

## 2022-10-10 ENCOUNTER — Other Ambulatory Visit: Payer: Self-pay | Admitting: Internal Medicine

## 2022-10-10 DIAGNOSIS — Z1231 Encounter for screening mammogram for malignant neoplasm of breast: Secondary | ICD-10-CM

## 2022-10-15 DIAGNOSIS — B379 Candidiasis, unspecified: Secondary | ICD-10-CM | POA: Diagnosis not present

## 2022-10-15 DIAGNOSIS — R490 Dysphonia: Secondary | ICD-10-CM | POA: Diagnosis not present

## 2022-10-15 DIAGNOSIS — F17200 Nicotine dependence, unspecified, uncomplicated: Secondary | ICD-10-CM | POA: Diagnosis not present

## 2022-10-15 DIAGNOSIS — J018 Other acute sinusitis: Secondary | ICD-10-CM | POA: Diagnosis not present

## 2022-10-18 DIAGNOSIS — M25572 Pain in left ankle and joints of left foot: Secondary | ICD-10-CM | POA: Diagnosis not present

## 2022-10-24 ENCOUNTER — Ambulatory Visit (INDEPENDENT_AMBULATORY_CARE_PROVIDER_SITE_OTHER): Payer: Medicare HMO

## 2022-10-24 ENCOUNTER — Ambulatory Visit (INDEPENDENT_AMBULATORY_CARE_PROVIDER_SITE_OTHER): Payer: Medicare HMO | Admitting: Podiatry

## 2022-10-24 DIAGNOSIS — S93692A Other sprain of left foot, initial encounter: Secondary | ICD-10-CM

## 2022-10-24 DIAGNOSIS — M66872 Spontaneous rupture of other tendons, left ankle and foot: Secondary | ICD-10-CM

## 2022-10-24 DIAGNOSIS — M66372 Spontaneous rupture of flexor tendons, left ankle and foot: Secondary | ICD-10-CM | POA: Diagnosis not present

## 2022-10-24 DIAGNOSIS — Q666 Other congenital valgus deformities of feet: Secondary | ICD-10-CM | POA: Diagnosis not present

## 2022-10-24 NOTE — Patient Instructions (Signed)
Call [facility] to schedule your MRI at the below locations.  Please allow at least 1 business day after your visit to process the referral.  It may take longer depending on approval from insurance.  Please let me know if you have issues or problems scheduling the MRI   [facility] [phone] [address] [place], [place] [address]  [facility] [phone] [address] [place], [place] [address]  

## 2022-10-24 NOTE — Progress Notes (Signed)
  Subjective:  Patient ID: Lindsey Wolf, female    DOB: 1965/11/05,  MRN: [id number]  Chief Complaint  Patient presents with   Tendonitis    follow up after PT, from surgery done 05/2022 - patient states that she is not really any better and that now both feet hurt     57 y.o. female returns for post-op check.  She has completed physical therapy at this point they are waiting to hear from her visit today how she is doing.  She states that she is able to walk for about 30 minutes for the foot starts to hurt, the foot still turns out and feels fatigued and tired.  Overall she says she feels like she is not any better than she was before she had surgery. She has a pair of orthotics from [facility] that she is wearing currently with a shoe as well as a pair at home from the [employer] store  Review of Systems: Negative except as noted in the HPI. Denies N/V/F/Ch.   Objective:  There were no vitals filed for this visit. There is no height or weight on file to calculate BMI. Constitutional Well developed. Well nourished.  Vascular Foot warm and well perfused. Capillary refill normal to all digits.  Calf is soft and supple, no posterior calf or knee pain, negative Homans' sign  Neurologic Normal speech. Oriented to person, place, and time. Epicritic sensation to light touch grossly present bilaterally.  Dermatologic Incision well-healed not hyper trophic.  Scar tissue on lateral heel has resolved.  She has 4+ out of 5 strength in inversion and eversion dorsiflexion and plantarflexion.  No pain with resisted motion.  No pain to palpation on the FDL tendon transfer site medially or the lateral heel.   Orthopedic: Minimal tenderness today.  Edema improved.  Strength is good   Multiple view plain film radiographs: New radiographs taken today show complete osseous healing of her osteotomy, unchanged position of the anchor for the transfer in the navicular, she does still have transverse plane  abduction deformity with uncovering of the talonavicular joint Assessment:   1. Tibialis posterior tendon tear, nontraumatic, left   2. Pes planovalgus   3. Spring ligament tear, left, initial encounter    Plan:  Patient was evaluated and treated and all questions answered.  S/p foot surgery left -Unfortunate she is overall still unsatisfied with her surgical outcome.  I recommended MRI at this point to evaluate the FDL transfer site, integrity of the tendon as well as the spring ligament imbrication and repair.  She does still have transverse plane deformity.  We could consider further intervention including Evans or cotton osteotomy, at the time of surgery I was pleased with her correction that I did not feel this was necessary.  I have no physical restrictions for she may be weightbearing as tolerated is much as she would like to in her supportive shoes as well as using her custom molded and prefabricated orthoses in both feet.  MRI has been ordered to evaluate the soft tissues and repair from [facility].  I will see her back after the MRI for further review.  Return if symptoms worsen or fail to improve, for after MRI to review.

## 2022-11-06 ENCOUNTER — Ambulatory Visit
Admission: RE | Admit: 2022-11-06 | Discharge: 2022-11-06 | Disposition: A | Discharge status: Home / Self Care | Payer: Medicare HMO | Source: Ambulatory Visit | Attending: Internal Medicine | Admitting: Internal Medicine

## 2022-11-06 ENCOUNTER — Ambulatory Visit
Admission: RE | Admit: 2022-11-06 | Discharge: 2022-11-06 | Disposition: A | Discharge status: Home / Self Care | Payer: Medicare HMO | Source: Ambulatory Visit | Attending: Podiatry | Admitting: Podiatry

## 2022-11-06 DIAGNOSIS — Z1231 Encounter for screening mammogram for malignant neoplasm of breast: Secondary | ICD-10-CM

## 2022-11-06 DIAGNOSIS — S93692A Other sprain of left foot, initial encounter: Secondary | ICD-10-CM

## 2022-11-06 DIAGNOSIS — M66872 Spontaneous rupture of other tendons, left ankle and foot: Secondary | ICD-10-CM

## 2022-11-06 DIAGNOSIS — M25572 Pain in left ankle and joints of left foot: Secondary | ICD-10-CM | POA: Diagnosis not present

## 2022-11-06 DIAGNOSIS — Q666 Other congenital valgus deformities of feet: Secondary | ICD-10-CM

## 2022-11-07 DIAGNOSIS — J302 Other seasonal allergic rhinitis: Secondary | ICD-10-CM | POA: Diagnosis not present

## 2022-11-07 DIAGNOSIS — B379 Candidiasis, unspecified: Secondary | ICD-10-CM | POA: Diagnosis not present

## 2022-11-07 DIAGNOSIS — R490 Dysphonia: Secondary | ICD-10-CM | POA: Diagnosis not present

## 2022-11-07 DIAGNOSIS — R519 Headache, unspecified: Secondary | ICD-10-CM | POA: Diagnosis not present

## 2022-11-08 ENCOUNTER — Other Ambulatory Visit: Payer: Medicare Other

## 2022-11-08 ENCOUNTER — Other Ambulatory Visit: Payer: Self-pay | Admitting: Student

## 2022-11-08 DIAGNOSIS — R519 Headache, unspecified: Secondary | ICD-10-CM

## 2022-11-13 ENCOUNTER — Ambulatory Visit: Payer: Medicare HMO | Admitting: Podiatry

## 2022-11-14 NOTE — Addendum Note (Signed)
Addended byLilian Kapur, Jarae Panas R on: 11/14/2022 09:45 AM   Modules accepted: Orders

## 2022-11-14 NOTE — Progress Notes (Deleted)
11/15/2022 1:26 PM   Lindsey Wolf 1965/07/07 [id number]  Referring provider: Barbette Reichmann, MD [address] [facility] [place],  [place] [address]  Urological history: 1. OAB -Contributing factors of age, depression, anxiety, alcohol consumption and smoking  2. Urge incontinence -Contributing factors of age, depression, anxiety, alcohol consumption and smoking  3. Stress incontinence -Contributing factors of age, depression, anxiety, alcohol consumption and smoking  No chief complaint on file.   HPI: Lindsey Wolf is a 57 y.o. female who presents today for one year follow up.  PVR ***                                                                       PMH: Past Medical History:  Diagnosis Date   Abnormal respirations    Abscess of abdominal cavity (HCC)    Anxiety    Aortic atherosclerosis (HCC)    Arthritis    Borderline anemia    Depression    Empyema of left pleural space (HCC)    GERD (gastroesophageal reflux disease)    Headache    Hypokalemia    Perforated ulcer (HCC)    Pleural effusion    Protein-calorie malnutrition, severe 08/17/2016   Respiratory distress    Sepsis (HCC) 2018    Surgical History: Past Surgical History:  Procedure Laterality Date   ABDOMINAL WALL DEFECT REPAIR N/A 11/21/2017   Procedure: REPAIR ABDOMINAL WALL, ABDOMINAL WALL RECONSTRUCTION with mesh;  Surgeon: Leafy Ro, MD;  Location: [facility] OR[facility];  Service: General;  Laterality: N/A;   APPLICATION OF WOUND VAC N/A 08/12/2016   Procedure: ,remove 2  JP drains,add one pennrose;  Surgeon: Lattie Haw, MD;  Location: [facility];  Service: General;  Laterality: N/A;   CALCANEAL OSTEOTOMY  06/01/2022   Procedure: CALCANEAL OSTEOTOMY;  Surgeon: Edwin Cap, DPM;  Location: [facility];  Service: Podiatry;;   CHEST TUBE INSERTION Right 08/08/2016   Procedure: CHEST TUBE INSERTION;  Surgeon: Hulda Marin, MD;  Location: [facility];  Service: General;   Laterality: Right;   COLONOSCOPY WITH PROPOFOL N/A 04/29/2018   Procedure: COLONOSCOPY WITH PROPOFOL;  Surgeon: Christena Deem, MD;  Location: [facility] ENDOSCOPY;  Service: Endoscopy;  Laterality: N/A;   DEBRIDEMENT OF ABDOMINAL WALL ABSCESS N/A 08/08/2016   Procedure: DEBRIDEMENT OF ABDOMINAL WALL ABSCESS;  Surgeon: Lattie Haw, MD;  Location: [facility];  Service: General;  Laterality: N/A;   DIAGNOSTIC LAPAROSCOPY     ESOPHAGOGASTRODUODENOSCOPY (EGD) WITH PROPOFOL N/A 02/19/2017   Procedure: ESOPHAGOGASTRODUODENOSCOPY (EGD) WITH PROPOFOL;  Surgeon: Wyline Mood, MD;  Location: [facility] ENDOSCOPY;  Service: Gastroenterology;  Laterality: N/A;   ESOPHAGOGASTRODUODENOSCOPY (EGD) WITH PROPOFOL N/A 01/20/2018   Procedure: ESOPHAGOGASTRODUODENOSCOPY (EGD) WITH PROPOFOL;  Surgeon: Christena Deem, MD;  Location: [facility];  Service: Endoscopy;  Laterality: N/A;   FLAT FOOT RECONSTRUCTION-TAL GASTROC RECESSION Left 06/01/2022   Procedure: FLAT FOOT RECONSTRUCTION-TAL GASTROC RECESSION;  Surgeon: Edwin Cap, DPM;  Location: [facility];  Service: Podiatry;  Laterality: Left;  BLOCK   LAPAROTOMY N/A 07/20/2016   Procedure: EXPLORATORY LAPAROTOMY;  Surgeon: Leafy Ro, MD;  Location: [facility];  Service: General;  Laterality: N/A;   LAPAROTOMY N/A 07/30/2016   Procedure: EXPLORATORY LAPAROTOMY drainage peritoneal  abscess;  Surgeon: Ricarda Frame, MD;  Location: [facility];  Service: General;  Laterality: N/A;   LYSIS OF ADHESION  07/30/2016   Procedure: LYSIS OF ADHESION;  Surgeon: Ricarda Frame, MD;  Location: [facility];  Service: General;;   TIBIALIS TENDON TRANSFER / REPAIR Left 06/01/2022   Procedure: TIBIALIS TENDON TRANSFER/repair spring ligament;  Surgeon: Edwin Cap, DPM;  Location: [facility];  Service: Podiatry;  Laterality: Left;   TONSILLECTOMY     VIDEO ASSISTED THORACOSCOPY (VATS)/THOROCOTOMY Left 08/08/2016   Procedure: VIDEO ASSISTED THORACOSCOPY (VATS)/THOROCOTOMY Possible  Thoracotomy;  Surgeon: Hulda Marin, MD;  Location: [facility];  Service: General;  Laterality: Left;    Home Medications:  Allergies as of 11/15/2022   No Known Allergies      Medication List        Accurate as of November 14, 2022  1:26 PM. If you have any questions, ask your nurse or doctor.          albuterol 108 (90 Base) MCG/ACT inhaler Commonly known as: VENTOLIN HFA Inhale 2 puffs into the lungs every 6 (six) hours as needed for wheezing or shortness of breath.   ALPRAZolam 1 MG tablet Commonly known as: XANAX Take 1 mg by mouth every morning. And PRN   CITRACAL PO Take 1 tablet by mouth daily.   clonazePAM 1 MG tablet Commonly known as: KLONOPIN Take by mouth.   gabapentin 300 MG capsule Commonly known as: NEURONTIN Take 1 capsule (300 mg total) by mouth 3 (three) times daily for 7 days.   Gemtesa 75 MG Tabs Generic drug: Vibegron Take 75 mg by mouth daily. What changed: when to take this   nortriptyline 25 MG capsule Commonly known as: PAMELOR Take 25 mg by mouth at bedtime. headaches   pantoprazole 40 MG tablet Commonly known as: PROTONIX TAKE 1 TABLET BY MOUTH TWICE DAILY   rivaroxaban 10 MG Tabs tablet Commonly known as: XARELTO Take 1 tablet (10 mg total) by mouth daily.   rosuvastatin 5 MG tablet Commonly known as: CRESTOR Take 5 mg by mouth at bedtime.   Trelegy Ellipta 100-62.5-25 MCG/ACT Aepb Generic drug: Fluticasone-Umeclidin-Vilant Inhale 1 puff into the lungs at bedtime.   Trintellix 20 MG Tabs tablet Generic drug: vortioxetine HBr Take 20 mg by mouth at bedtime.   VITAMIN B 12 PO Take 1 tablet by mouth daily at 6 (six) AM.        Allergies: No Known Allergies  Family History: Family History  Problem Relation Age of Onset   Cancer Mother        Lung   COPD Mother    Emphysema Mother    Neuropathy Mother    Anxiety disorder Mother    Depression Mother    Heart disease Father 34       Heart Attack   Cancer Maternal  Grandmother        Doesn't remember   Emphysema Maternal Aunt    Cancer Other        Doesn't remember   Breast cancer Neg Hx     Social History:  reports that she quit smoking about a year ago. Her smoking use included cigarettes. She has a 11.00 pack-year smoking history. She has never used smokeless tobacco. She reports current alcohol use. She reports that she does not use drugs.  ROS: Pertinent ROS in HPI  Physical Exam: LMP 02/19/2005   Constitutional:  Well nourished. Alert and oriented, No acute distress. HEENT: [place] AT, moist mucus membranes.  Trachea midline, no masses. Cardiovascular: No clubbing, cyanosis, or edema. Respiratory: Normal respiratory effort, no increased work of breathing. GU: No CVA tenderness.  No bladder fullness or masses. Vulvovaginal atrophy w/ pallor, loss of rugae, introital retraction, excoriations.  Vulvar thinning, fusion of labia, clitoral hood retraction, prominent urethral meatus.   *** external genitalia, *** pubic hair distribution, no lesions.  Normal urethral meatus, no lesions, no prolapse, no discharge.   No urethral masses, tenderness and/or tenderness. No bladder fullness, tenderness or masses. *** vagina mucosa, *** estrogen effect, no discharge, no lesions, *** pelvic support, *** cystocele and *** rectocele noted.  No cervical motion tenderness.  Uterus is freely mobile and non-fixed.  No adnexal/parametria masses or tenderness noted.  Anus and perineum are without rashes or lesions.   ***  Neurologic: Grossly intact, no focal deficits, moving all 4 extremities. Psychiatric: Normal mood and affect.    Laboratory Data: Serum creatinine (08/2022) .09, eGFR 75 Hemoglobin A1c (08/2022) 6.0  Urinalysis w/Microscopic Order: [id number] Component Ref Range & Units 2 mo ago  Color Colorless, Straw, Light Yellow, Yellow, Dark Yellow Colorless  Clarity Clear Clear  Specific Gravity 1.005 - 1.030 1.005  pH, Urine 5.0 - 8.0 5.5  Protein,  Urinalysis Negative mg/dL Negative  Glucose, Urinalysis Negative mg/dL Negative  Ketones, Urinalysis Negative mg/dL Negative  Blood, Urinalysis Negative Negative  Nitrite, Urinalysis Negative Negative  Leukocyte Esterase, Urinalysis Negative Negative  Bilirubin, Urinalysis Negative Negative  Urobilinogen, Urinalysis 0.2 - 1.0 mg/dL 0.2  WBC, UA <=5 /hpf <1  Red Blood Cells, Urinalysis <=3 /hpf <1  Bacteria, Urinalysis 0 - 5 /hpf 0-5  Squamous Epithelial Cells, Urinalysis /hpf 0  Resulting Agency [facility]   Specimen Collected: 08/28/22 09:50   Performed by: [facility] Last Resulted: 08/28/22 10:39  Received From: [facility]  Result Received: 08/29/22 10:44  I have reviewed the labs.    Pertinent Imaging: ***    Assessment & Plan:    1. OAB -at goal w/ Gemtesa 75 mg daily -I have sent in a prescription for her to her pharmacy, but likely we will need to obtain prior authorization so have also given her samples to supplement while we are obtaining that from her insurance  2. Urge incontinence -see # 1  3. SUI -continue Kegel exercises at home   No follow-ups on file.  These notes generated with voice recognition software. I apologize for typographical errors.  Cloretta Ned  [facility] [address]  [address] [place], [place] [address] [phone]

## 2022-11-15 ENCOUNTER — Ambulatory Visit: Payer: Medicare HMO | Admitting: Urology

## 2022-11-19 ENCOUNTER — Telehealth: Payer: Self-pay | Admitting: Podiatry

## 2022-11-19 NOTE — Telephone Encounter (Signed)
Pt is requesting a handicap sticker.   Please advise

## 2022-11-21 ENCOUNTER — Ambulatory Visit
Admission: RE | Admit: 2022-11-21 | Discharge: 2022-11-21 | Disposition: A | Discharge status: Home / Self Care | Payer: Medicare HMO | Source: Ambulatory Visit | Attending: Student | Admitting: Student

## 2022-11-21 DIAGNOSIS — R519 Headache, unspecified: Secondary | ICD-10-CM | POA: Diagnosis not present

## 2022-11-21 MED ORDER — GADOBUTROL 1 MMOL/ML IV SOLN
8.0000 mL | Freq: Once | INTRAVENOUS | Status: AC | PRN
  Administered 2022-11-21: 8 mL via INTRAVENOUS

## 2022-11-22 ENCOUNTER — Other Ambulatory Visit: Payer: Medicare HMO

## 2022-12-04 DIAGNOSIS — J209 Acute bronchitis, unspecified: Secondary | ICD-10-CM | POA: Diagnosis not present

## 2022-12-04 DIAGNOSIS — R5383 Other fatigue: Secondary | ICD-10-CM | POA: Diagnosis not present

## 2022-12-04 DIAGNOSIS — K279 Peptic ulcer, site unspecified, unspecified as acute or chronic, without hemorrhage or perforation: Secondary | ICD-10-CM | POA: Diagnosis not present

## 2022-12-04 DIAGNOSIS — R635 Abnormal weight gain: Secondary | ICD-10-CM | POA: Diagnosis not present

## 2022-12-04 DIAGNOSIS — Z720 Tobacco use: Secondary | ICD-10-CM | POA: Diagnosis not present

## 2022-12-04 DIAGNOSIS — R0981 Nasal congestion: Secondary | ICD-10-CM | POA: Diagnosis not present

## 2022-12-04 DIAGNOSIS — M170 Bilateral primary osteoarthritis of knee: Secondary | ICD-10-CM | POA: Diagnosis not present

## 2022-12-04 DIAGNOSIS — J438 Other emphysema: Secondary | ICD-10-CM | POA: Diagnosis not present

## 2022-12-04 DIAGNOSIS — F324 Major depressive disorder, single episode, in partial remission: Secondary | ICD-10-CM | POA: Diagnosis not present

## 2022-12-04 DIAGNOSIS — R1310 Dysphagia, unspecified: Secondary | ICD-10-CM | POA: Diagnosis not present

## 2022-12-04 DIAGNOSIS — R7309 Other abnormal glucose: Secondary | ICD-10-CM | POA: Diagnosis not present

## 2022-12-04 DIAGNOSIS — N39498 Other specified urinary incontinence: Secondary | ICD-10-CM | POA: Diagnosis not present

## 2022-12-04 DIAGNOSIS — R519 Headache, unspecified: Secondary | ICD-10-CM | POA: Diagnosis not present

## 2022-12-04 DIAGNOSIS — R8290 Unspecified abnormal findings in urine: Secondary | ICD-10-CM | POA: Diagnosis not present

## 2022-12-04 DIAGNOSIS — R5381 Other malaise: Secondary | ICD-10-CM | POA: Diagnosis not present

## 2022-12-11 DIAGNOSIS — M170 Bilateral primary osteoarthritis of knee: Secondary | ICD-10-CM | POA: Diagnosis not present

## 2022-12-12 DIAGNOSIS — J302 Other seasonal allergic rhinitis: Secondary | ICD-10-CM | POA: Diagnosis not present

## 2022-12-12 DIAGNOSIS — R519 Headache, unspecified: Secondary | ICD-10-CM | POA: Diagnosis not present

## 2022-12-12 DIAGNOSIS — K219 Gastro-esophageal reflux disease without esophagitis: Secondary | ICD-10-CM | POA: Diagnosis not present

## 2022-12-12 DIAGNOSIS — B379 Candidiasis, unspecified: Secondary | ICD-10-CM | POA: Diagnosis not present

## 2022-12-12 DIAGNOSIS — R490 Dysphonia: Secondary | ICD-10-CM | POA: Diagnosis not present

## 2022-12-18 ENCOUNTER — Ambulatory Visit
Admission: RE | Admit: 2022-12-18 | Discharge: 2022-12-18 | Disposition: A | Discharge status: Home / Self Care | Payer: Medicare HMO | Source: Ambulatory Visit | Attending: Podiatry | Admitting: Podiatry

## 2022-12-18 DIAGNOSIS — M170 Bilateral primary osteoarthritis of knee: Secondary | ICD-10-CM | POA: Diagnosis not present

## 2022-12-18 DIAGNOSIS — M2142 Flat foot [pes planus] (acquired), left foot: Secondary | ICD-10-CM | POA: Diagnosis not present

## 2022-12-18 DIAGNOSIS — Z9889 Other specified postprocedural states: Secondary | ICD-10-CM | POA: Diagnosis not present

## 2022-12-18 DIAGNOSIS — M66372 Spontaneous rupture of flexor tendons, left ankle and foot: Secondary | ICD-10-CM

## 2022-12-24 DIAGNOSIS — R251 Tremor, unspecified: Secondary | ICD-10-CM | POA: Diagnosis not present

## 2022-12-24 DIAGNOSIS — G44229 Chronic tension-type headache, not intractable: Secondary | ICD-10-CM | POA: Diagnosis not present

## 2022-12-24 DIAGNOSIS — F411 Generalized anxiety disorder: Secondary | ICD-10-CM | POA: Diagnosis not present

## 2022-12-28 DIAGNOSIS — M1812 Unilateral primary osteoarthritis of first carpometacarpal joint, left hand: Secondary | ICD-10-CM | POA: Diagnosis not present

## 2022-12-28 DIAGNOSIS — M1811 Unilateral primary osteoarthritis of first carpometacarpal joint, right hand: Secondary | ICD-10-CM | POA: Diagnosis not present

## 2023-01-18 ENCOUNTER — Ambulatory Visit: Payer: Medicare HMO | Admitting: Podiatry

## 2023-01-18 DIAGNOSIS — M66872 Spontaneous rupture of other tendons, left ankle and foot: Secondary | ICD-10-CM

## 2023-01-18 NOTE — Progress Notes (Signed)
No chief complaint on file.   POV #2 DOS 06/01/2022 LT FOOT RECONSTRUCTION OF ARCH W/TENDON/LIGAMENT REPAIR, TENDON TRANSFER, BONE CUT IN HEL & ARCH W/BONE GRAFT, CALF MUSCLE LENGTHENING    Subjective:  Patient presents today status post PT tendon repair with FDL transfer, medial slide calcaneal osteotomy, and spring ligament repair left lower extremity.  DOS: 06/01/2022 Dr. Lilian Kapur. Patient has continued to experience pain and tenderness to the left lower extremity postoperatively.  MRI was ordered as well as ultrasound to better visualize the FDL transfer tendon and surgical site.  She is unsure why she came in today under my schedule.  She was expecting to return to clinic with Dr. Lilian Kapur to review the MRI results and ultrasound findings and discuss further treatment options.  Past Medical History:  Diagnosis Date   Abnormal respirations    Abscess of abdominal cavity (HCC)    Anxiety    Aortic atherosclerosis (HCC)    Arthritis    Borderline anemia    Depression    Empyema of left pleural space (HCC)    GERD (gastroesophageal reflux disease)    Headache    Hypokalemia    Perforated ulcer (HCC)    Pleural effusion    Protein-calorie malnutrition, severe 08/17/2016   Respiratory distress    Sepsis (HCC) 2018    Past Surgical History:  Procedure Laterality Date   ABDOMINAL WALL DEFECT REPAIR N/A 11/21/2017   Procedure: REPAIR ABDOMINAL WALL, ABDOMINAL WALL RECONSTRUCTION with mesh;  Surgeon: Leafy Ro, MD;  Location: [facility];  Service: General;  Laterality: N/A;   APPLICATION OF WOUND VAC N/A 08/12/2016   Procedure: ,remove 2  JP drains,add one pennrose;  Surgeon: Lattie Haw, MD;  Location: [facility];  Service: General;  Laterality: N/A;   CALCANEAL OSTEOTOMY  06/01/2022   Procedure: CALCANEAL OSTEOTOMY;  Surgeon: Edwin Cap, DPM;  Location: [facility];  Service: Podiatry;;   CHEST TUBE INSERTION Right 08/08/2016   Procedure: CHEST TUBE INSERTION;  Surgeon:  Hulda Marin, MD;  Location: [facility];  Service: General;  Laterality: Right;   COLONOSCOPY WITH PROPOFOL N/A 04/29/2018   Procedure: COLONOSCOPY WITH PROPOFOL;  Surgeon: Christena Deem, MD;  Location: [facility]OSCOPY;  Service: Endoscopy;  Laterality: N/A;   DEBRIDEMENT OF ABDOMINAL WALL ABSCESS N/A 08/08/2016   Procedure: DEBRIDEMENT OF ABDOMINAL WALL ABSCESS;  Surgeon: Lattie Haw, MD;  Location: [facility];  Service: General;  Laterality: N/A;   DIAGNOSTIC LAPAROSCOPY     ESOPHAGOGASTRODUODENOSCOPY (EGD) WITH PROPOFOL N/A 02/19/2017   Procedure: ESOPHAGOGASTRODUODENOSCOPY (EGD) WITH PROPOFOL;  Surgeon: Wyline Mood, MD;  Location: [facility]OSCOPY;  Service: Gastroenterology;  Laterality: N/A;   ESOPHAGOGASTRODUODENOSCOPY (EGD) WITH PROPOFOL N/A 01/20/2018   Procedure: ESOPHAGOGASTRODUODENOSCOPY (EGD) WITH PROPOFOL;  Surgeon: Christena Deem, MD;  Location: [facility] ENDOSCOPY;  Service: Endoscopy;  Laterality: N/A;   FLAT FOOT RECONSTRUCTION-TAL GASTROC RECESSION Left 06/01/2022   Procedure: FLAT FOOT RECONSTRUCTION-TAL GASTROC RECESSION;  Surgeon: Edwin Cap, DPM;  Location: [facility];  Service: Podiatry;  Laterality: Left;  BLOCK   LAPAROTOMY N/A 07/20/2016   Procedure: EXPLORATORY LAPAROTOMY;  Surgeon: Leafy Ro, MD;  Location: [facility];  Service: General;  Laterality: N/A;   LAPAROTOMY N/A 07/30/2016   Procedure: EXPLORATORY LAPAROTOMY drainage peritoneal abscess;  Surgeon: Ricarda Frame, MD;  Location: [facility];  Service: General;  Laterality: N/A;   LYSIS OF ADHESION  07/30/2016   Procedure: LYSIS OF ADHESION;  Surgeon: Ricarda Frame, MD;  Location: [facility];  Service:  General;;   TIBIALIS TENDON TRANSFER / REPAIR Left 06/01/2022   Procedure: TIBIALIS TENDON TRANSFER/repair spring ligament;  Surgeon: Edwin Cap, DPM;  Location: [facility];  Service: Podiatry;  Laterality: Left;   TONSILLECTOMY     VIDEO ASSISTED THORACOSCOPY (VATS)/THOROCOTOMY Left 08/08/2016   Procedure:  VIDEO ASSISTED THORACOSCOPY (VATS)/THOROCOTOMY Possible Thoracotomy;  Surgeon: Hulda Marin, MD;  Location: [facility];  Service: General;  Laterality: Left;   No Known Allergies   LT foot 01/18/2023  LT foot 01/18/2023  Objective/Physical Exam Neurovascular status intact.  Incisions of healed.  With weightbearing there is collapse of the medial longitudinal arch of the foot and rearfoot valgus.  Diffuse generalized pain throughout palpation of the foot and ankle  MR ANKLE LEFT WO CONTRAST 11/06/2022 IMPRESSION: 1. Interval resection of the distal aspect of the tibialis posterior tendon. Flexor digitorum longus tendon transfer to the navicular. The distal aspect of the FDL tendon is markedly heterogeneous and at least partially torn with nonvisualization of normal tendon at the new attachment site at the navicular. 2. Interval calcaneal osteotomy with 2 traversing threaded screws. No marrow edema or obvious signs of hardware loosening by MRI. 3. Pes planovalgus alignment.  [place] Extrem Low Left Ltd 12/18/2022 FINDINGS: Focused ultrasound of the medial hindfoot demonstrates postsurgical changes from FDL tendon transfer. The FDL is partially torn inferior to the medial malleolus and the distal tendon is severely attenuated and not well visualized. No fluid collection.   IMPRESSION: 1. Postsurgical changes of FDL tendon transfer with high-grade partial versus complete tear of the distal tendon near the navicular.  Assessment: 1. s/p PT tendon repair with FDL transfer onto the navicular, spring ligament repair, medial slide calcaneal osteotomy LT.  Dr. Sharl Ma. DOS: 06/01/2022   Plan of Care:  -Patient was evaluated.  MRI and ultrasound findings reviewed today with the patient - Unfortunately the patient continues to suffer from pain and tenderness associated to the foot with residual flatfoot deformity -I am unsure why the patient was placed on my schedule today to review her MRI  findings and ultrasound results.  She would like to follow-up with Dr. Lilian Kapur to review the results in detail and discuss further treatment options which may require revisional surgery to correct for her flatfoot deformity and better maintain the medial longitudinal arch of the foot and alignment of the rear foot -Appointment scheduled with Dr. Perrin Smack, DPM [facility]  Dr. Felecia Shelling, DPM    [address] [place], [place] [address]                Office [phone]  Fax [phone]

## 2023-01-30 ENCOUNTER — Ambulatory Visit (INDEPENDENT_AMBULATORY_CARE_PROVIDER_SITE_OTHER): Payer: Medicare HMO | Admitting: Podiatry

## 2023-01-30 DIAGNOSIS — Q666 Other congenital valgus deformities of feet: Secondary | ICD-10-CM

## 2023-01-30 DIAGNOSIS — R251 Tremor, unspecified: Secondary | ICD-10-CM | POA: Diagnosis not present

## 2023-01-30 DIAGNOSIS — M66372 Spontaneous rupture of flexor tendons, left ankle and foot: Secondary | ICD-10-CM | POA: Diagnosis not present

## 2023-01-30 DIAGNOSIS — M66872 Spontaneous rupture of other tendons, left ankle and foot: Secondary | ICD-10-CM | POA: Diagnosis not present

## 2023-01-30 NOTE — Progress Notes (Signed)
Subjective:  Patient ID: Lindsey Wolf, female    DOB: 14-Jun-1965,  MRN: [id number]  No chief complaint on file.    57 y.o. female returns for post-op check.  She returns for follow-up she has completed the MRI and ultrasound, foot still turns out and is quite painful and limiting for her.  Review of Systems: Negative except as noted in the HPI. Denies N/V/F/Ch.   Objective:  There were no vitals filed for this visit. There is no height or weight on file to calculate BMI. Constitutional Well developed. Well nourished.  Vascular Foot warm and well perfused. Capillary refill normal to all digits.  Calf is soft and supple, no posterior calf or knee pain, negative Homans' sign  Neurologic Normal speech. Oriented to person, place, and time. Epicritic sensation to light touch grossly present bilaterally.  Dermatologic Incision well-healed not hyper trophic.   Orthopedic: Foot is still abducted during gait, she does have weakness and pain along the incision and FDL tendon transfer site   Multiple view plain film radiographs: New radiographs taken today show complete osseous healing of her osteotomy, unchanged position of the anchor for the transfer in the navicular, she does still have transverse plane abduction deformity with uncovering of the talonavicular joint  Study Result  Narrative & Impression  CLINICAL DATA:  Status post distal posterior tibialis tendon resection with FDL tendon transfer in December. No improvement in her symptoms with continued flatfoot.   EXAM: ULTRASOUND LEFT LOWER EXTREMITY LIMITED   TECHNIQUE: Ultrasound examination of the lower extremity soft tissues was performed in the area of clinical concern.   COMPARISON:  MRI left ankle dated November 06, 2022.   FINDINGS: Focused ultrasound of the medial hindfoot demonstrates postsurgical changes from FDL tendon transfer. The FDL is partially torn inferior to the medial malleolus and the distal tendon is  severely attenuated and not well visualized. No fluid collection.   IMPRESSION: 1. Postsurgical changes of FDL tendon transfer with high-grade partial versus complete tear of the distal tendon near the navicular.     Electronically Signed   By: Obie Dredge M.D.   On: 12/18/2022 15:01    Study Result  Narrative & Impression  CLINICAL DATA:  Ankle pain, tendon abnormality suspected, neg xray evaluate tendon after transfer/surgery   EXAM: MRI OF THE LEFT ANKLE WITHOUT CONTRAST   TECHNIQUE: Multiplanar, multisequence MR imaging of the ankle was performed. No intravenous contrast was administered.   COMPARISON:  X-ray 10/24/2022, MRI 01/10/2022   FINDINGS: TENDONS   Peroneal: Peroneus longus and brevis tendons are intact and normally positioned.   Posteromedial: Interval resection of the distal aspect of the tibialis posterior tendon. Flexor digitorum longus tendon transfer to the navicular. The distal aspect of the FDL tendon is markedly heterogeneous and at least partially torn with nonvisualization of normal tendon at the new attachment site at the navicular. Flexor hallucis longus tendon intact.   Anterior: Tibialis anterior, extensor hallucis longus, and extensor digitorum longus tendons are intact and normally positioned.   Achilles: Intact.   Plantar Fascia: Intact.   LIGAMENTS   Lateral: Intact tibiofibular ligaments. The anterior and posterior talofibular ligaments are intact. Intact calcaneofibular ligament.   Medial: Intact deltoid ligament. Spring ligament complex appears intact without evidence of repair.   CARTILAGE AND BONES   Ankle Joint: No significant ankle joint effusion. The talar dome and tibial plafond are intact.   Subtalar Joints/Sinus Tarsi: No cartilage defect. No effusion. Preservation of the anatomic fat within the  sinus tarsi.   Bones: Interval calcaneal osteotomy with 2 traversing threaded screws. No marrow edema or  obvious signs of hardware loosening by MRI. No acute fracture or dislocation. Pes planovalgus alignment. Tendon anchor within the medial navicular.   Other: Postsurgical changes with soft tissue thickening at the medial aspect of the ankle and midfoot. No organized fluid collections.   IMPRESSION: 1. Interval resection of the distal aspect of the tibialis posterior tendon. Flexor digitorum longus tendon transfer to the navicular. The distal aspect of the FDL tendon is markedly heterogeneous and at least partially torn with nonvisualization of normal tendon at the new attachment site at the navicular. 2. Interval calcaneal osteotomy with 2 traversing threaded screws. No marrow edema or obvious signs of hardware loosening by MRI. 3. Pes planovalgus alignment.     Electronically Signed   By: Duanne Guess D.O.   On: 11/12/2022 09:17      Assessment:   1. Tibialis posterior tendon tear, nontraumatic, left   2. Spontaneous rupture of flexor tendon of left foot   3. Pes planovalgus    Plan:  Patient was evaluated and treated and all questions answered.  S/p foot surgery left -I reviewed the results of the ultrasound and the MRI with her.  She has had rupture and tear at least partially of the FDL tendon that has been transferred since surgery that has resulted in failure of the procedure.  From a surgical standpoint only option for salvage will be a double arthrodesis.  I discussed this option with her and she is keen to avoid further surgery at this point considering she has had problems with this 1 has not helped her so far.  From a nonsurgical standpoint she was not able to tolerate a Richie brace previously that dug into the inside of the arch and pressed and caused pain.  We discussed different options such as an [place] mezzo brace that would not cross the ankle joint both support the hindfoot joints and stabilize the arch from collapsing in the foot abducting further.  I would  like to have her evaluated by her orthotist for bracing for this.  I will see her back as needed and if she is interested in reconsidering surgical fusion of the hindfoot with double arthrodesis.  Return if symptoms worsen or fail to improve.

## 2023-02-05 DIAGNOSIS — L821 Other seborrheic keratosis: Secondary | ICD-10-CM | POA: Diagnosis not present

## 2023-02-05 DIAGNOSIS — L710 Perioral dermatitis: Secondary | ICD-10-CM | POA: Diagnosis not present

## 2023-02-05 DIAGNOSIS — D2372 Other benign neoplasm of skin of left lower limb, including hip: Secondary | ICD-10-CM | POA: Diagnosis not present

## 2023-02-08 ENCOUNTER — Ambulatory Visit: Payer: Medicare HMO

## 2023-02-08 NOTE — Progress Notes (Signed)
Patient was present and discussed options for Left ankle brace  Patient received Richie brace from [facility] approx 5yr ago and is not elig with [employer] but only every 79yrs for AFO bracing [employer] 2ndary may cover UCBL style orthotics but patient will need to go to [facility] as well due to [facility] type I will verify with Chip Boer that we [employer] and patient then will have to go to [facility] or [facility]   [employer], CFo, CFm

## 2023-02-12 DIAGNOSIS — F411 Generalized anxiety disorder: Secondary | ICD-10-CM | POA: Diagnosis not present

## 2023-02-12 DIAGNOSIS — G44229 Chronic tension-type headache, not intractable: Secondary | ICD-10-CM | POA: Diagnosis not present

## 2023-03-11 DIAGNOSIS — Z720 Tobacco use: Secondary | ICD-10-CM | POA: Diagnosis not present

## 2023-03-11 DIAGNOSIS — K219 Gastro-esophageal reflux disease without esophagitis: Secondary | ICD-10-CM | POA: Diagnosis not present

## 2023-03-11 DIAGNOSIS — F3341 Major depressive disorder, recurrent, in partial remission: Secondary | ICD-10-CM | POA: Diagnosis not present

## 2023-03-11 DIAGNOSIS — R7309 Other abnormal glucose: Secondary | ICD-10-CM | POA: Diagnosis not present

## 2023-03-11 DIAGNOSIS — R519 Headache, unspecified: Secondary | ICD-10-CM | POA: Diagnosis not present

## 2023-03-11 DIAGNOSIS — R635 Abnormal weight gain: Secondary | ICD-10-CM | POA: Diagnosis not present

## 2023-03-18 DIAGNOSIS — R635 Abnormal weight gain: Secondary | ICD-10-CM | POA: Diagnosis not present

## 2023-03-18 DIAGNOSIS — Z0000 Encounter for general adult medical examination without abnormal findings: Secondary | ICD-10-CM | POA: Diagnosis not present

## 2023-03-18 DIAGNOSIS — E538 Deficiency of other specified B group vitamins: Secondary | ICD-10-CM | POA: Diagnosis not present

## 2023-03-18 DIAGNOSIS — E876 Hypokalemia: Secondary | ICD-10-CM | POA: Diagnosis not present

## 2023-03-18 DIAGNOSIS — F411 Generalized anxiety disorder: Secondary | ICD-10-CM | POA: Diagnosis not present

## 2023-03-18 DIAGNOSIS — R519 Headache, unspecified: Secondary | ICD-10-CM | POA: Diagnosis not present

## 2023-03-18 DIAGNOSIS — F17210 Nicotine dependence, cigarettes, uncomplicated: Secondary | ICD-10-CM | POA: Diagnosis not present

## 2023-03-18 DIAGNOSIS — M79672 Pain in left foot: Secondary | ICD-10-CM | POA: Diagnosis not present

## 2023-03-18 DIAGNOSIS — Z6828 Body mass index (BMI) 28.0-28.9, adult: Secondary | ICD-10-CM | POA: Diagnosis not present

## 2023-04-01 DIAGNOSIS — E876 Hypokalemia: Secondary | ICD-10-CM | POA: Diagnosis not present

## 2023-04-01 DIAGNOSIS — E538 Deficiency of other specified B group vitamins: Secondary | ICD-10-CM | POA: Diagnosis not present

## 2023-04-08 ENCOUNTER — Ambulatory Visit
Admission: RE | Admit: 2023-04-08 | Discharge: 2023-04-08 | Disposition: A | Discharge status: Home / Self Care | Payer: Medicare HMO | Source: Ambulatory Visit | Attending: Internal Medicine | Admitting: Internal Medicine

## 2023-04-08 DIAGNOSIS — F17210 Nicotine dependence, cigarettes, uncomplicated: Secondary | ICD-10-CM | POA: Diagnosis not present

## 2023-04-08 DIAGNOSIS — Z87891 Personal history of nicotine dependence: Secondary | ICD-10-CM | POA: Diagnosis not present

## 2023-04-08 DIAGNOSIS — Z122 Encounter for screening for malignant neoplasm of respiratory organs: Secondary | ICD-10-CM | POA: Insufficient documentation

## 2023-05-07 ENCOUNTER — Other Ambulatory Visit: Payer: Self-pay | Admitting: Acute Care

## 2023-05-07 DIAGNOSIS — F17210 Nicotine dependence, cigarettes, uncomplicated: Secondary | ICD-10-CM

## 2023-05-07 DIAGNOSIS — Z87891 Personal history of nicotine dependence: Secondary | ICD-10-CM

## 2023-05-07 DIAGNOSIS — Z122 Encounter for screening for malignant neoplasm of respiratory organs: Secondary | ICD-10-CM

## 2023-06-07 ENCOUNTER — Other Ambulatory Visit: Payer: Self-pay | Admitting: Urology

## 2023-06-07 ENCOUNTER — Other Ambulatory Visit: Payer: Self-pay

## 2023-06-07 ENCOUNTER — Emergency Department
Admission: EM | Admit: 2023-06-07 | Discharge: 2023-06-07 | Disposition: A | Discharge status: Home / Self Care | Payer: Medicare HMO | Attending: Emergency Medicine | Admitting: Emergency Medicine

## 2023-06-07 ENCOUNTER — Emergency Department: Payer: Medicare HMO

## 2023-06-07 DIAGNOSIS — M545 Low back pain, unspecified: Secondary | ICD-10-CM | POA: Diagnosis not present

## 2023-06-07 DIAGNOSIS — M160 Bilateral primary osteoarthritis of hip: Secondary | ICD-10-CM | POA: Diagnosis not present

## 2023-06-07 DIAGNOSIS — M25551 Pain in right hip: Secondary | ICD-10-CM | POA: Diagnosis not present

## 2023-06-07 DIAGNOSIS — M25552 Pain in left hip: Secondary | ICD-10-CM | POA: Insufficient documentation

## 2023-06-07 DIAGNOSIS — M47816 Spondylosis without myelopathy or radiculopathy, lumbar region: Secondary | ICD-10-CM | POA: Diagnosis not present

## 2023-06-07 DIAGNOSIS — N3281 Overactive bladder: Secondary | ICD-10-CM

## 2023-06-07 LAB — CBC WITH DIFFERENTIAL/PLATELET
Abs Immature Granulocytes: 0.03 10*3/uL (ref 0.00–0.07)
Basophils Absolute: 0 10*3/uL (ref 0.0–0.1)
Basophils Relative: 0 %
Eosinophils Absolute: 0.1 10*3/uL (ref 0.0–0.5)
Eosinophils Relative: 1 %
HCT: 42.3 % (ref 36.0–46.0)
Hemoglobin: 14.6 g/dL (ref 12.0–15.0)
Immature Granulocytes: 0 %
Lymphocytes Relative: 23 %
Lymphs Abs: 1.6 10*3/uL (ref 0.7–4.0)
MCH: 37.2 pg — ABNORMAL HIGH (ref 26.0–34.0)
MCHC: 34.5 g/dL (ref 30.0–36.0)
MCV: 107.9 fL — ABNORMAL HIGH (ref 80.0–100.0)
Monocytes Absolute: 0.7 10*3/uL (ref 0.1–1.0)
Monocytes Relative: 9 %
Neutro Abs: 4.8 10*3/uL (ref 1.7–7.7)
Neutrophils Relative %: 67 %
Platelets: 362 10*3/uL (ref 150–400)
RBC: 3.92 MIL/uL (ref 3.87–5.11)
RDW: 14.8 % (ref 11.5–15.5)
WBC: 7.2 10*3/uL (ref 4.0–10.5)
nRBC: 0 % (ref 0.0–0.2)

## 2023-06-07 LAB — BASIC METABOLIC PANEL
Anion gap: 16 — ABNORMAL HIGH (ref 5–15)
BUN: 8 mg/dL (ref 6–20)
CO2: 24 mmol/L (ref 22–32)
Calcium: 9 mg/dL (ref 8.9–10.3)
Chloride: 100 mmol/L (ref 98–111)
Creatinine, Ser: 0.71 mg/dL (ref 0.44–1.00)
GFR, Estimated: >60 mL/min (ref >60)
Glucose, Bld: 102 mg/dL — ABNORMAL HIGH (ref 70–99)
Potassium: 3.2 mmol/L — ABNORMAL LOW (ref 3.5–5.1)
Sodium: 140 mmol/L (ref 135–145)

## 2023-06-07 LAB — CK: Total CK: 57 U/L (ref 38–234)

## 2023-06-07 MED ORDER — OXYCODONE-ACETAMINOPHEN 5-325 MG PO TABS: 1.0000 | ORAL_TABLET | ORAL | 0 refills | Status: AC | PRN

## 2023-06-07 MED ORDER — OXYCODONE-ACETAMINOPHEN 5-325 MG PO TABS
1.0000 | ORAL_TABLET | Freq: Once | ORAL | Status: AC
  Administered 2023-06-07: 1 via ORAL
  Filled 2023-06-07: qty 1

## 2023-06-07 MED ORDER — POTASSIUM CHLORIDE CRYS ER 20 MEQ PO TBCR
20.0000 meq | EXTENDED_RELEASE_TABLET | Freq: Once | ORAL | Status: AC
  Administered 2023-06-07: 20 meq via ORAL
  Filled 2023-06-07: qty 1

## 2023-06-07 NOTE — ED Triage Notes (Signed)
 Patient states bilateral hip pain; worsened after foot surgery in December 2023.

## 2023-06-07 NOTE — Discharge Instructions (Signed)
 You are seen in the emergency department for hip pain.  Your x-ray of your hips showed signs of arthritis.  Your muscle test was normal.  Talk to your primary care physician, your statin could be causing worsening lumbar muscle pain.  Follow-up with your primary care physician and with your orthopedic specialist.  Discussed possible referral to his pain specialist.  You were given a prescription for narcotic pain medications.  Take only if in severe pain.  These are very addictive medications.  These medications can make you constipated.  If you need to take more than 1-2 doses, start a stool softner.  If you become constipated, take 1 capfull of MiraLAX , can repeat untill having regular bowel movements.  Keep this medication out of reach of any children.   Thank you for choosing us  for your health care, it was my pleasure to care for you today!  Clotilda Punter, MD

## 2023-06-07 NOTE — ED Provider Notes (Signed)
 [facility] Provider Note    Event Date/Time   First MD Initiated Contact with Patient 06/07/23 2209     (approximate)   History   Hip Pain   HPI  Lindsey Wolf is a 58 y.o. female past medical history significant for arthritis, multiple surgery to both of her feet, who presents to the emergency department with hip, leg and lower back pain.  States that she started having pain in her lower back that radiated to bilateral legs and down to her feet.  Worse with ambulation.  Denies any swelling or redness.  Denies any saddle anesthesia, urinary or bowel incontinence.  Denies any lower extremity weakness.  States that she is followed by podiatry for ongoing issues with her feet that required surgery and may need another surgery.  States that she had multiple falls in the past couple of years secondary to gait issues.  A fall a couple of days ago worsening the pain to both of her hips.  States that she cannot take NSAIDs secondary of a history of perforated bowel ulcer and severe sepsis.       Physical Exam   Triage Vital Signs: ED Triage Vitals  Encounter Vitals Group     BP 06/07/23 1641 (!) 135/95     Systolic BP Percentile --      Diastolic BP Percentile --      Pulse Rate 06/07/23 1641 89     Resp 06/07/23 1641 18     Temp 06/07/23 1641 98.7 F (37.1 C)     Temp Source 06/07/23 1641 Oral     SpO2 06/07/23 1641 95 %     Weight 06/07/23 1640 200 lb (90.7 kg)     Height 06/07/23 1640 5' 10 (1.778 m)     Head Circumference --      Peak Flow --      Pain Score 06/07/23 1639 9     Pain Loc --      Pain Education --      Exclude from Growth Chart --     Most recent vital signs: Vitals:   06/07/23 2256 06/07/23 2320  BP: (!) 159/78 (!) 152/82  Pulse: 93 88  Resp: 18 16  Temp:    SpO2: 100% 100%    Physical Exam Constitutional:      Appearance: She is well-developed.  HENT:     Head: Atraumatic.  Eyes:     Conjunctiva/sclera:  Conjunctivae normal.  Cardiovascular:     Rate and Rhythm: Regular rhythm.  Pulmonary:     Effort: No respiratory distress.  Abdominal:     General: There is no distension.  Musculoskeletal:        General: Normal range of motion.     Cervical back: Normal range of motion.     Comments: Scars to bilateral feet.  No erythema or warmth.  No induration.  No lower extremity edema.  +2 DP pulses.  No saddle anesthesia.  5/5 strength bilateral lower extremities.  Mild tenderness to palpation to bilateral pelvis.  Skin:    General: Skin is warm.  Neurological:     Mental Status: She is alert. Mental status is at baseline.      IMPRESSION / MDM / ASSESSMENT AND PLAN / ED COURSE  I reviewed the triage vital signs and the nursing notes.  Differential diagnosis including statin induced myositis, arthritis, fracture, dislocation, musculoskeletal pain, sciatica.  Low suspicion for cauda equina or epidural compression syndrome,  do not feel that the patient needs an emergent MRI.   RADIOLOGY X-ray of the pelvis was read as signs of arthritis -without fracture   Labs (all labs ordered are listed, but only abnormal results are displayed) Labs interpreted as -    Labs Reviewed  BASIC METABOLIC PANEL - Abnormal; Notable for the following components:      Result Value   Potassium 3.2 (*)    Glucose, Bld 102 (*)    Anion gap 16 (*)    All other components within normal limits  CBC WITH DIFFERENTIAL/PLATELET - Abnormal; Notable for the following components:   MCV 107.9 (*)    MCH 37.2 (*)    All other components within normal limits  CK    Lab work overall reassuring, mild hypokalemia which the patient states that she has a history of and just picked up a prescription of potassium tablets.  States that she has been tolerating p.o.  Has been dehydrated since being in the emergency department.  Low suspicion for occult fracture, able to move bilateral hips.  No significant midline lumbar  tenderness to palpation. Discussed symptomatic treatment and will do a short course of pain medication.  Discussed close follow-up with her primary care physician and her orthopedic doctor.  Discussed symptoms to return to the emergency department.     PROCEDURES:  Critical Care performed: No  Procedures  Patient's presentation is most consistent with severe exacerbation of chronic illness.   MEDICATIONS ORDERED IN ED: Medications  oxyCODONE -acetaminophen  (PERCOCET/ROXICET) 5-325 MG per tablet 1 tablet (1 tablet Oral Given 06/07/23 2318)  potassium chloride  SA (KLOR-CON  M) CR tablet 20 mEq (20 mEq Oral Given 06/07/23 2318)    FINAL CLINICAL IMPRESSION(S) / ED DIAGNOSES   Final diagnoses:  Bilateral hip pain     Rx / DC Orders   ED Discharge Orders          Ordered    oxyCODONE -acetaminophen  (PERCOCET) 5-325 MG tablet  Every 4 hours PRN        06/07/23 2334             Note:  This document was prepared using Dragon voice recognition software and may include unintentional dictation errors.   Suzanne Kirsch, MD 06/08/23 [phone]

## 2023-06-07 NOTE — ED Provider Triage Note (Signed)
 Emergency Medicine Provider Triage Evaluation Note  Lindsey Wolf , a 58 y.o. female  was evaluated in triage.  Pt complains of bilateral hip pain in the last year.  Patient has an appointment with orthopedics in January 14.  Patient cannot take any NSAIDs due to gastric ulcer  Review of Systems  Positive:  Negative:   Physical Exam  BP (!) 135/95 (BP Location: Left Arm)   Pulse 89   Temp 98.7 F (37.1 C) (Oral)   Resp 18   Ht 5' 10 (1.778 m)   Wt 90.7 kg   LMP 02/19/2005   SpO2 95%   BMI 28.70 kg/m  Gen:   Awake, no distress   Resp:  Normal effort  MSK:   Moves extremities without difficulty  Other:    Medical Decision Making  Medically screening exam initiated at 4:46 PM.  Appropriate orders placed.  Aubrianne Molyneux Kramme was informed that the remainder of the evaluation will be completed by another provider, this initial triage assessment does not replace that evaluation, and the importance of remaining in the ED until their evaluation is complete.  Patient will have x-ray, CBC and CMP   Janit Kast, PA-C 06/07/23 1647

## 2023-06-08 ENCOUNTER — Telehealth: Payer: Self-pay | Admitting: Emergency Medicine

## 2023-06-12 NOTE — Telephone Encounter (Signed)
 In error

## 2023-06-18 DIAGNOSIS — M7062 Trochanteric bursitis, left hip: Secondary | ICD-10-CM | POA: Diagnosis not present

## 2023-06-18 DIAGNOSIS — M166 Other bilateral secondary osteoarthritis of hip: Secondary | ICD-10-CM | POA: Diagnosis not present

## 2023-06-18 DIAGNOSIS — M7061 Trochanteric bursitis, right hip: Secondary | ICD-10-CM | POA: Diagnosis not present

## 2023-06-18 DIAGNOSIS — M25551 Pain in right hip: Secondary | ICD-10-CM | POA: Diagnosis not present

## 2023-06-18 DIAGNOSIS — M170 Bilateral primary osteoarthritis of knee: Secondary | ICD-10-CM | POA: Diagnosis not present

## 2023-06-18 DIAGNOSIS — M25552 Pain in left hip: Secondary | ICD-10-CM | POA: Diagnosis not present

## 2023-06-28 DIAGNOSIS — F430 Acute stress reaction: Secondary | ICD-10-CM | POA: Diagnosis not present

## 2023-06-28 DIAGNOSIS — Z03818 Encounter for observation for suspected exposure to other biological agents ruled out: Secondary | ICD-10-CM | POA: Diagnosis not present

## 2023-06-28 DIAGNOSIS — B9689 Other specified bacterial agents as the cause of diseases classified elsewhere: Secondary | ICD-10-CM | POA: Diagnosis not present

## 2023-06-28 DIAGNOSIS — J40 Bronchitis, not specified as acute or chronic: Secondary | ICD-10-CM | POA: Diagnosis not present

## 2023-06-28 DIAGNOSIS — Z720 Tobacco use: Secondary | ICD-10-CM | POA: Diagnosis not present

## 2023-06-28 DIAGNOSIS — F411 Generalized anxiety disorder: Secondary | ICD-10-CM | POA: Diagnosis not present

## 2023-06-28 DIAGNOSIS — J019 Acute sinusitis, unspecified: Secondary | ICD-10-CM | POA: Diagnosis not present

## 2023-07-04 DIAGNOSIS — J018 Other acute sinusitis: Secondary | ICD-10-CM | POA: Diagnosis not present

## 2023-07-23 DIAGNOSIS — M170 Bilateral primary osteoarthritis of knee: Secondary | ICD-10-CM | POA: Diagnosis not present

## 2023-07-30 DIAGNOSIS — Z86018 Personal history of other benign neoplasm: Secondary | ICD-10-CM | POA: Diagnosis not present

## 2023-07-30 DIAGNOSIS — L578 Other skin changes due to chronic exposure to nonionizing radiation: Secondary | ICD-10-CM | POA: Diagnosis not present

## 2023-07-30 DIAGNOSIS — L710 Perioral dermatitis: Secondary | ICD-10-CM | POA: Diagnosis not present

## 2023-07-30 DIAGNOSIS — L821 Other seborrheic keratosis: Secondary | ICD-10-CM | POA: Diagnosis not present

## 2023-07-30 DIAGNOSIS — D2262 Melanocytic nevi of left upper limb, including shoulder: Secondary | ICD-10-CM | POA: Diagnosis not present

## 2023-07-30 DIAGNOSIS — D485 Neoplasm of uncertain behavior of skin: Secondary | ICD-10-CM | POA: Diagnosis not present

## 2023-08-19 ENCOUNTER — Telehealth: Payer: Self-pay | Admitting: Podiatry

## 2023-08-19 DIAGNOSIS — M76822 Posterior tibial tendinitis, left leg: Secondary | ICD-10-CM | POA: Diagnosis not present

## 2023-08-19 DIAGNOSIS — M722 Plantar fascial fibromatosis: Secondary | ICD-10-CM | POA: Diagnosis not present

## 2023-08-19 DIAGNOSIS — M79672 Pain in left foot: Secondary | ICD-10-CM | POA: Diagnosis not present

## 2023-08-19 NOTE — Telephone Encounter (Signed)
 Pt called upset as she is trying to go for a second opinion and has had to cancel one time already because she could not get the records needed.  She has contacted medical records and they gave her most of it but it was missing the imaging of the xrays from 12/23/21,06/25/22.10/24/22 amd 01/30/23.  I have printed the images that were in the chart and faxed them to her per her request. We do have a release on file thru medical records.

## 2023-08-22 DIAGNOSIS — G44229 Chronic tension-type headache, not intractable: Secondary | ICD-10-CM | POA: Diagnosis not present

## 2023-08-22 DIAGNOSIS — J302 Other seasonal allergic rhinitis: Secondary | ICD-10-CM | POA: Diagnosis not present

## 2023-08-23 ENCOUNTER — Telehealth: Payer: Self-pay | Admitting: Podiatry

## 2023-08-23 NOTE — Telephone Encounter (Signed)
 Patient is frustrated with past surgery and trying to get a brace.  She said you could do another surgery and she did not ask you if it was a different surgery or the same surgery.  Please call.

## 2023-08-27 ENCOUNTER — Encounter: Payer: Self-pay | Admitting: Podiatry

## 2023-08-27 NOTE — Telephone Encounter (Signed)
 Called patient back explaining that [facility] only covers every 5 years she called and LM for [place] that if we submit for auth we can get approval for new AFO even after she has only had her current brace from [facility] for about 2 years  Also they only cover 80% I explained she would get billed the other 20% as we cannot bill her Medicaid  If she were to go to [place] they can [employer] and most likely get prior auth from [place] as well so that when [employer] will still possibly pick up

## 2023-09-16 ENCOUNTER — Ambulatory Visit (INDEPENDENT_AMBULATORY_CARE_PROVIDER_SITE_OTHER)

## 2023-09-16 ENCOUNTER — Ambulatory Visit (INDEPENDENT_AMBULATORY_CARE_PROVIDER_SITE_OTHER): Admitting: Podiatry

## 2023-09-16 ENCOUNTER — Encounter: Payer: Self-pay | Admitting: Podiatry

## 2023-09-16 DIAGNOSIS — Q666 Other congenital valgus deformities of feet: Secondary | ICD-10-CM

## 2023-09-16 DIAGNOSIS — M66372 Spontaneous rupture of flexor tendons, left ankle and foot: Secondary | ICD-10-CM

## 2023-09-16 DIAGNOSIS — M66872 Spontaneous rupture of other tendons, left ankle and foot: Secondary | ICD-10-CM

## 2023-09-16 NOTE — Progress Notes (Signed)
 Subjective:  Patient ID: Lindsey Wolf, female    DOB: [birth year],  MRN: [id number]  Chief Complaint  Patient presents with   Foot Pain    "He did a surgery on December of 2023.  He sent a message on MyChart about another possible surgery he could do.  He also mentioned an Warden/ranger.  My insurance company will pay for it.  I had these (orthotics) made in 2013.  My doctor told me to start wearing them but I think my feet have changed."     58 y.o. female returns for post-op check.  She returns for follow-up on her previous surgery and tendon rupture.  The Richie brace has not been helpful.  She brought her old orthotics with her today.  Review of Systems: Negative except as noted in the HPI. Denies N/V/F/Ch.   Objective:  There were no vitals filed for this visit. There is no height or weight on file to calculate BMI. Constitutional Well developed. Well nourished.  Vascular Foot warm and well perfused. Capillary refill normal to all digits.  Calf is soft and supple, no posterior calf or knee pain, negative Homans' sign  Neurologic Normal speech. Oriented to person, place, and time. Epicritic sensation to light touch grossly present bilaterally.  Dermatologic Incision well-healed not hyper trophic.   Orthopedic: Foot is still abducted during gait, she does have weakness and pain along the incision and FDL tendon transfer site   Multiple view plain film radiographs: New radiographs taken today show complete osseous healing of her osteotomy, no fracture or stress fracture severe transverse plane and valgus deformity  Study Result  Narrative & Impression  CLINICAL DATA:  Status post distal posterior tibialis tendon resection with FDL tendon transfer in December. No improvement in her symptoms with continued flatfoot.   EXAM: ULTRASOUND LEFT LOWER EXTREMITY LIMITED   TECHNIQUE: Ultrasound examination of the lower extremity soft tissues was performed in the area of  clinical concern.   COMPARISON:  MRI left ankle dated November 06, 2022.   FINDINGS: Focused ultrasound of the medial hindfoot demonstrates postsurgical changes from FDL tendon transfer. The FDL is partially torn inferior to the medial malleolus and the distal tendon is severely attenuated and not well visualized. No fluid collection.   IMPRESSION: 1. Postsurgical changes of FDL tendon transfer with high-grade partial versus complete tear of the distal tendon near the navicular.     Electronically Signed   By: Obie Dredge M.D.   On: 12/18/2022 15:01    Study Result  Narrative & Impression  CLINICAL DATA:  Ankle pain, tendon abnormality suspected, neg xray evaluate tendon after transfer/surgery   EXAM: MRI OF THE LEFT ANKLE WITHOUT CONTRAST   TECHNIQUE: Multiplanar, multisequence MR imaging of the ankle was performed. No intravenous contrast was administered.   COMPARISON:  X-ray 10/24/2022, MRI 01/10/2022   FINDINGS: TENDONS   Peroneal: Peroneus longus and brevis tendons are intact and normally positioned.   Posteromedial: Interval resection of the distal aspect of the tibialis posterior tendon. Flexor digitorum longus tendon transfer to the navicular. The distal aspect of the FDL tendon is markedly heterogeneous and at least partially torn with nonvisualization of normal tendon at the new attachment site at the navicular. Flexor hallucis longus tendon intact.   Anterior: Tibialis anterior, extensor hallucis longus, and extensor digitorum longus tendons are intact and normally positioned.   Achilles: Intact.   Plantar Fascia: Intact.   LIGAMENTS   Lateral: Intact tibiofibular ligaments. The anterior and posterior  talofibular ligaments are intact. Intact calcaneofibular ligament.   Medial: Intact deltoid ligament. Spring ligament complex appears intact without evidence of repair.   CARTILAGE AND BONES   Ankle Joint: No significant ankle joint effusion.  The talar dome and tibial plafond are intact.   Subtalar Joints/Sinus Tarsi: No cartilage defect. No effusion. Preservation of the anatomic fat within the sinus tarsi.   Bones: Interval calcaneal osteotomy with 2 traversing threaded screws. No marrow edema or obvious signs of hardware loosening by MRI. No acute fracture or dislocation. Pes planovalgus alignment. Tendon anchor within the medial navicular.   Other: Postsurgical changes with soft tissue thickening at the medial aspect of the ankle and midfoot. No organized fluid collections.   IMPRESSION: 1. Interval resection of the distal aspect of the tibialis posterior tendon. Flexor digitorum longus tendon transfer to the navicular. The distal aspect of the FDL tendon is markedly heterogeneous and at least partially torn with nonvisualization of normal tendon at the new attachment site at the navicular. 2. Interval calcaneal osteotomy with 2 traversing threaded screws. No marrow edema or obvious signs of hardware loosening by MRI. 3. Pes planovalgus alignment.     Electronically Signed   By: Leverne Reading D.O.   On: 11/12/2022 09:17      Assessment:   1. Pes planovalgus   2. Tibialis posterior tendon tear, nontraumatic, left   3. Spontaneous rupture of flexor tendon of left foot    Plan:  Patient was evaluated and treated and all questions answered.  S/p foot surgery left - She returns for follow-up today.  We discussed further surgical options including double arthrodesis and discussed the risks and recovery for this.  I do think this likely would give her better positioning and BeneFoot but she is hesitant to proceed with surgery right now due to multiple issues and I discussed with her that currently at her smoking level of 1 pack/day this would need to be significantly decreased before proceeding with this to mitigate risks as do not want further complications from this surgery.  I inspected her orthotics which have  a decent carbon fiber shell and fit but they are rigidly painful with minimal top-cover.  I recommended refurbishing these with a medial flange scaphoid pad and a Spenco and PPT top-cover for comfort.  She was okay with this and we also discussed an [place]  brace which she said she would not like to pursue which understandably would be difficult to wear and find fit for her.  In the interim until her orthotics are able to be refurbished for increase support of the ankle and midfoot I recommended a Tri-Lock ankle brace.  She was fitted and dispensed with this today.  I will see her back if the orthotics are accessible or if she is able to quit smoking to proceed with surgery.  No follow-ups on file.

## 2023-09-18 ENCOUNTER — Telehealth: Payer: Self-pay

## 2023-09-18 NOTE — Telephone Encounter (Signed)
 Inserts sent to [facility] for Refurb. Patient will owe $90

## 2023-09-23 DIAGNOSIS — F411 Generalized anxiety disorder: Secondary | ICD-10-CM | POA: Diagnosis not present

## 2023-09-23 DIAGNOSIS — F3341 Major depressive disorder, recurrent, in partial remission: Secondary | ICD-10-CM | POA: Diagnosis not present

## 2023-09-23 DIAGNOSIS — E876 Hypokalemia: Secondary | ICD-10-CM | POA: Diagnosis not present

## 2023-09-23 DIAGNOSIS — Z720 Tobacco use: Secondary | ICD-10-CM | POA: Diagnosis not present

## 2023-09-23 DIAGNOSIS — Z0000 Encounter for general adult medical examination without abnormal findings: Secondary | ICD-10-CM | POA: Diagnosis not present

## 2023-09-23 DIAGNOSIS — R7309 Other abnormal glucose: Secondary | ICD-10-CM | POA: Diagnosis not present

## 2023-09-23 DIAGNOSIS — E538 Deficiency of other specified B group vitamins: Secondary | ICD-10-CM | POA: Diagnosis not present

## 2023-09-23 DIAGNOSIS — E7800 Pure hypercholesterolemia, unspecified: Secondary | ICD-10-CM | POA: Diagnosis not present

## 2023-09-26 DIAGNOSIS — F324 Major depressive disorder, single episode, in partial remission: Secondary | ICD-10-CM | POA: Diagnosis not present

## 2023-09-26 DIAGNOSIS — F17210 Nicotine dependence, cigarettes, uncomplicated: Secondary | ICD-10-CM | POA: Diagnosis not present

## 2023-09-26 DIAGNOSIS — F411 Generalized anxiety disorder: Secondary | ICD-10-CM | POA: Diagnosis not present

## 2023-09-26 DIAGNOSIS — Z0000 Encounter for general adult medical examination without abnormal findings: Secondary | ICD-10-CM | POA: Diagnosis not present

## 2023-09-26 DIAGNOSIS — E538 Deficiency of other specified B group vitamins: Secondary | ICD-10-CM | POA: Diagnosis not present

## 2023-09-26 DIAGNOSIS — R635 Abnormal weight gain: Secondary | ICD-10-CM | POA: Diagnosis not present

## 2023-09-26 DIAGNOSIS — E876 Hypokalemia: Secondary | ICD-10-CM | POA: Diagnosis not present

## 2023-09-26 DIAGNOSIS — R400 Somnolence: Secondary | ICD-10-CM | POA: Diagnosis not present

## 2023-09-26 DIAGNOSIS — J439 Emphysema, unspecified: Secondary | ICD-10-CM | POA: Diagnosis not present

## 2023-09-26 DIAGNOSIS — Z6829 Body mass index (BMI) 29.0-29.9, adult: Secondary | ICD-10-CM | POA: Diagnosis not present

## 2023-09-26 DIAGNOSIS — R519 Headache, unspecified: Secondary | ICD-10-CM | POA: Diagnosis not present

## 2023-09-26 DIAGNOSIS — R5381 Other malaise: Secondary | ICD-10-CM | POA: Diagnosis not present

## 2023-10-07 ENCOUNTER — Other Ambulatory Visit: Payer: Self-pay | Admitting: Internal Medicine

## 2023-10-07 DIAGNOSIS — G44229 Chronic tension-type headache, not intractable: Secondary | ICD-10-CM | POA: Diagnosis not present

## 2023-10-07 DIAGNOSIS — J302 Other seasonal allergic rhinitis: Secondary | ICD-10-CM | POA: Diagnosis not present

## 2023-10-07 DIAGNOSIS — Z1231 Encounter for screening mammogram for malignant neoplasm of breast: Secondary | ICD-10-CM

## 2023-10-09 ENCOUNTER — Telehealth: Payer: Self-pay | Admitting: Podiatry

## 2023-10-09 NOTE — Telephone Encounter (Signed)
 Pt needs confirmation that insoles are ready.Dropped insoles of around April 14 th.

## 2023-10-21 ENCOUNTER — Telehealth: Payer: Self-pay

## 2023-10-21 ENCOUNTER — Telehealth: Payer: Self-pay | Admitting: Podiatry

## 2023-10-21 NOTE — Telephone Encounter (Signed)
 LVM  pt to inform orthotics are here.. can pick them up owes $90 upon PU

## 2023-10-21 NOTE — Telephone Encounter (Signed)
 Patient called about orthotics, originally called in on 5/7 in regards to orthotics she dropped off in April. Wanting to know if they are ready to be picked up. Patient was extremely upset stating that she's been trying to reach out in regards to this for 2 weeks and has heard nothing back as of today. Can someone please confirm if they have arrived or not and either call her or let me know? Thanks

## 2023-10-29 DIAGNOSIS — E538 Deficiency of other specified B group vitamins: Secondary | ICD-10-CM | POA: Diagnosis not present

## 2023-10-29 DIAGNOSIS — E876 Hypokalemia: Secondary | ICD-10-CM | POA: Diagnosis not present

## 2023-10-31 ENCOUNTER — Telehealth: Payer: Self-pay

## 2023-10-31 NOTE — Telephone Encounter (Signed)
 Lm for patient as she went to [facility] office today and was upset that she would have to pay $90 for refurbished orthotics  I spoke with Dr Michalene Agee and he assured me he did not tell patient they would be covered as patient states someone told her they would be covered  I let her know I am taking with me to [facility] office and she can pu anytime after tomorrow

## 2023-11-01 NOTE — Progress Notes (Signed)
 Procedure code [id number] / 2 units added for refurbishment of old orthotics  Refurbish includes removal of old padding, add new top cover, buff edges and assure alignment for safety  Per patient this should be billed to her insurance  Britton Cane CPed, CFo, CFm

## 2023-11-12 ENCOUNTER — Ambulatory Visit
Admission: RE | Admit: 2023-11-12 | Discharge: 2023-11-12 | Disposition: A | Discharge status: Home / Self Care | Source: Ambulatory Visit | Attending: Internal Medicine | Admitting: Internal Medicine

## 2023-11-12 DIAGNOSIS — Z1231 Encounter for screening mammogram for malignant neoplasm of breast: Secondary | ICD-10-CM | POA: Diagnosis not present

## 2023-11-13 ENCOUNTER — Telehealth: Payer: Self-pay | Admitting: Podiatry

## 2023-11-13 NOTE — Telephone Encounter (Signed)
 Lindsey Wolf from [employer] states that the product for $90.00 is covered under their insurance plan; however, [employer] has not yet received the claim. He requests that the claim be faxed to [phone] for processing.

## 2023-12-02 DIAGNOSIS — E538 Deficiency of other specified B group vitamins: Secondary | ICD-10-CM | POA: Diagnosis not present

## 2023-12-09 ENCOUNTER — Telehealth: Payer: Self-pay

## 2023-12-09 NOTE — Telephone Encounter (Signed)
 Orthotics sent back to [facility] for full length top cover spenco and PPT combination will take to [facility] office when back patient can PU when in - No charge  Lolita Schultze CPed, CFo, CFm

## 2023-12-15 DIAGNOSIS — G4733 Obstructive sleep apnea (adult) (pediatric): Secondary | ICD-10-CM | POA: Diagnosis not present

## 2023-12-16 ENCOUNTER — Ambulatory Visit: Admitting: Neurology

## 2023-12-27 ENCOUNTER — Encounter: Payer: Self-pay | Admitting: Neurology

## 2023-12-27 ENCOUNTER — Ambulatory Visit: Admitting: Neurology

## 2023-12-31 DIAGNOSIS — L218 Other seborrheic dermatitis: Secondary | ICD-10-CM | POA: Diagnosis not present

## 2024-01-03 ENCOUNTER — Telehealth: Payer: Self-pay

## 2024-01-03 NOTE — Telephone Encounter (Signed)
 Orthotics in [place] bin

## 2024-01-03 NOTE — Telephone Encounter (Signed)
 LVM refurbished orthotics are here.. per last note pt would like to pick up in [place] office. They probably wont make it to [place] until next Thursday 8/7.SABRA

## 2024-01-06 DIAGNOSIS — E538 Deficiency of other specified B group vitamins: Secondary | ICD-10-CM | POA: Diagnosis not present

## 2024-01-08 DIAGNOSIS — M8588 Other specified disorders of bone density and structure, other site: Secondary | ICD-10-CM | POA: Diagnosis not present

## 2024-02-06 DIAGNOSIS — E538 Deficiency of other specified B group vitamins: Secondary | ICD-10-CM | POA: Diagnosis not present

## 2024-02-07 DIAGNOSIS — M1812 Unilateral primary osteoarthritis of first carpometacarpal joint, left hand: Secondary | ICD-10-CM | POA: Diagnosis not present

## 2024-02-07 DIAGNOSIS — M1811 Unilateral primary osteoarthritis of first carpometacarpal joint, right hand: Secondary | ICD-10-CM | POA: Diagnosis not present

## 2024-02-24 DIAGNOSIS — M170 Bilateral primary osteoarthritis of knee: Secondary | ICD-10-CM | POA: Diagnosis not present

## 2024-03-23 DIAGNOSIS — R7309 Other abnormal glucose: Secondary | ICD-10-CM | POA: Diagnosis not present

## 2024-03-23 DIAGNOSIS — R519 Headache, unspecified: Secondary | ICD-10-CM | POA: Diagnosis not present

## 2024-03-23 DIAGNOSIS — E876 Hypokalemia: Secondary | ICD-10-CM | POA: Diagnosis not present

## 2024-03-23 DIAGNOSIS — Z6829 Body mass index (BMI) 29.0-29.9, adult: Secondary | ICD-10-CM | POA: Diagnosis not present

## 2024-03-23 DIAGNOSIS — F3341 Major depressive disorder, recurrent, in partial remission: Secondary | ICD-10-CM | POA: Diagnosis not present

## 2024-03-23 DIAGNOSIS — Z720 Tobacco use: Secondary | ICD-10-CM | POA: Diagnosis not present

## 2024-03-23 DIAGNOSIS — K219 Gastro-esophageal reflux disease without esophagitis: Secondary | ICD-10-CM | POA: Diagnosis not present

## 2024-03-23 DIAGNOSIS — F411 Generalized anxiety disorder: Secondary | ICD-10-CM | POA: Diagnosis not present

## 2024-03-23 DIAGNOSIS — E538 Deficiency of other specified B group vitamins: Secondary | ICD-10-CM | POA: Diagnosis not present

## 2024-03-23 DIAGNOSIS — M150 Primary generalized (osteo)arthritis: Secondary | ICD-10-CM | POA: Diagnosis not present

## 2024-03-26 DIAGNOSIS — F17210 Nicotine dependence, cigarettes, uncomplicated: Secondary | ICD-10-CM | POA: Diagnosis not present

## 2024-03-26 DIAGNOSIS — E538 Deficiency of other specified B group vitamins: Secondary | ICD-10-CM | POA: Diagnosis not present

## 2024-03-26 DIAGNOSIS — F324 Major depressive disorder, single episode, in partial remission: Secondary | ICD-10-CM | POA: Diagnosis not present

## 2024-03-26 DIAGNOSIS — Z6829 Body mass index (BMI) 29.0-29.9, adult: Secondary | ICD-10-CM | POA: Diagnosis not present

## 2024-03-26 DIAGNOSIS — R519 Headache, unspecified: Secondary | ICD-10-CM | POA: Diagnosis not present

## 2024-03-26 DIAGNOSIS — Z0000 Encounter for general adult medical examination without abnormal findings: Secondary | ICD-10-CM | POA: Diagnosis not present

## 2024-03-26 DIAGNOSIS — R635 Abnormal weight gain: Secondary | ICD-10-CM | POA: Diagnosis not present

## 2024-03-26 DIAGNOSIS — E876 Hypokalemia: Secondary | ICD-10-CM | POA: Diagnosis not present

## 2024-03-27 ENCOUNTER — Other Ambulatory Visit: Payer: Self-pay | Admitting: Internal Medicine

## 2024-03-27 DIAGNOSIS — R911 Solitary pulmonary nodule: Secondary | ICD-10-CM

## 2024-04-07 ENCOUNTER — Ambulatory Visit
Admission: RE | Admit: 2024-04-07 | Discharge: 2024-04-07 | Disposition: A | Discharge status: Home / Self Care | Source: Ambulatory Visit | Attending: Internal Medicine | Admitting: Internal Medicine

## 2024-04-07 DIAGNOSIS — Z87891 Personal history of nicotine dependence: Secondary | ICD-10-CM

## 2024-04-07 DIAGNOSIS — F17210 Nicotine dependence, cigarettes, uncomplicated: Secondary | ICD-10-CM

## 2024-04-07 DIAGNOSIS — R911 Solitary pulmonary nodule: Secondary | ICD-10-CM

## 2024-04-07 DIAGNOSIS — Z122 Encounter for screening for malignant neoplasm of respiratory organs: Secondary | ICD-10-CM

## 2024-04-08 ENCOUNTER — Ambulatory Visit: Admission: RE | Admit: 2024-04-08 | Source: Ambulatory Visit

## 2024-04-10 ENCOUNTER — Ambulatory Visit

## 2024-04-15 ENCOUNTER — Ambulatory Visit

## 2024-04-23 DIAGNOSIS — E538 Deficiency of other specified B group vitamins: Secondary | ICD-10-CM | POA: Diagnosis not present

## 2024-05-04 ENCOUNTER — Ambulatory Visit
Admission: RE | Admit: 2024-05-04 | Discharge: 2024-05-04 | Disposition: A | Source: Ambulatory Visit | Attending: Acute Care | Admitting: Acute Care

## 2024-05-04 DIAGNOSIS — Z122 Encounter for screening for malignant neoplasm of respiratory organs: Secondary | ICD-10-CM | POA: Insufficient documentation

## 2024-05-04 DIAGNOSIS — F17210 Nicotine dependence, cigarettes, uncomplicated: Secondary | ICD-10-CM | POA: Insufficient documentation

## 2024-05-04 DIAGNOSIS — Z87891 Personal history of nicotine dependence: Secondary | ICD-10-CM | POA: Insufficient documentation

## 2024-05-08 ENCOUNTER — Other Ambulatory Visit: Payer: Self-pay

## 2024-05-08 DIAGNOSIS — Z87891 Personal history of nicotine dependence: Secondary | ICD-10-CM

## 2024-05-08 DIAGNOSIS — F17210 Nicotine dependence, cigarettes, uncomplicated: Secondary | ICD-10-CM

## 2024-05-08 DIAGNOSIS — Z122 Encounter for screening for malignant neoplasm of respiratory organs: Secondary | ICD-10-CM

## 2024-09-29 ENCOUNTER — Ambulatory Visit: Admitting: Neurology
